# Patient Record
Sex: Female | Born: 1975 | Race: Black or African American | Hispanic: No | Marital: Single | Smoking: Never smoker
Health system: Southern US, Community
[De-identification: ages and names within clinical notes are randomized; demographics above are authoritative.]

## PROBLEM LIST (undated history)

## (undated) DIAGNOSIS — F419 Anxiety disorder, unspecified: Secondary | ICD-10-CM

## (undated) DIAGNOSIS — T8859XA Other complications of anesthesia, initial encounter: Secondary | ICD-10-CM

## (undated) DIAGNOSIS — D649 Anemia, unspecified: Secondary | ICD-10-CM

## (undated) DIAGNOSIS — T4145XA Adverse effect of unspecified anesthetic, initial encounter: Secondary | ICD-10-CM

## (undated) HISTORY — PX: TUBAL LIGATION: SHX77

## (undated) HISTORY — PX: BREAST BIOPSY: SHX20

---

## 1997-03-01 ENCOUNTER — Inpatient Hospital Stay (HOSPITAL_COMMUNITY): Admission: AD | Admit: 1997-03-01 | Discharge: 1997-03-01 | Payer: Self-pay | Admitting: Obstetrics

## 1997-03-06 ENCOUNTER — Ambulatory Visit (HOSPITAL_COMMUNITY): Admission: RE | Admit: 1997-03-06 | Discharge: 1997-03-06 | Payer: Self-pay | Admitting: Obstetrics

## 1997-05-13 ENCOUNTER — Other Ambulatory Visit: Admission: RE | Admit: 1997-05-13 | Discharge: 1997-05-13 | Payer: Self-pay | Admitting: Obstetrics

## 1997-05-31 ENCOUNTER — Inpatient Hospital Stay (HOSPITAL_COMMUNITY): Admission: AD | Admit: 1997-05-31 | Discharge: 1997-05-31 | Payer: Self-pay | Admitting: Obstetrics

## 1997-06-08 ENCOUNTER — Inpatient Hospital Stay (HOSPITAL_COMMUNITY): Admission: AD | Admit: 1997-06-08 | Discharge: 1997-06-08 | Payer: Self-pay | Admitting: Obstetrics

## 1997-06-10 ENCOUNTER — Other Ambulatory Visit: Admission: RE | Admit: 1997-06-10 | Discharge: 1997-06-10 | Payer: Self-pay | Admitting: Obstetrics

## 1997-06-16 ENCOUNTER — Inpatient Hospital Stay (HOSPITAL_COMMUNITY): Admission: AD | Admit: 1997-06-16 | Discharge: 1997-06-16 | Payer: Self-pay | Admitting: Obstetrics

## 1997-06-23 ENCOUNTER — Inpatient Hospital Stay (HOSPITAL_COMMUNITY): Admission: AD | Admit: 1997-06-23 | Discharge: 1997-06-23 | Payer: Self-pay | Admitting: Obstetrics

## 1997-06-26 ENCOUNTER — Encounter (HOSPITAL_COMMUNITY): Admission: RE | Admit: 1997-06-26 | Discharge: 1997-06-29 | Payer: Self-pay | Admitting: Obstetrics

## 1997-06-28 ENCOUNTER — Inpatient Hospital Stay (HOSPITAL_COMMUNITY): Admission: AD | Admit: 1997-06-28 | Discharge: 1997-06-30 | Payer: Self-pay | Admitting: Obstetrics

## 1997-07-09 ENCOUNTER — Inpatient Hospital Stay (HOSPITAL_COMMUNITY): Admission: AD | Admit: 1997-07-09 | Discharge: 1997-07-10 | Payer: Self-pay | Admitting: Obstetrics

## 1997-08-18 ENCOUNTER — Ambulatory Visit (HOSPITAL_COMMUNITY): Admission: RE | Admit: 1997-08-18 | Discharge: 1997-08-18 | Payer: Self-pay | Admitting: Obstetrics

## 1999-03-12 ENCOUNTER — Encounter: Payer: Self-pay | Admitting: *Deleted

## 1999-03-12 ENCOUNTER — Emergency Department (HOSPITAL_COMMUNITY): Admission: EM | Admit: 1999-03-12 | Discharge: 1999-03-12 | Payer: Self-pay | Admitting: *Deleted

## 1999-06-06 ENCOUNTER — Other Ambulatory Visit: Admission: RE | Admit: 1999-06-06 | Discharge: 1999-06-06 | Payer: Self-pay | Admitting: Obstetrics

## 1999-10-23 ENCOUNTER — Encounter: Payer: Self-pay | Admitting: Emergency Medicine

## 1999-10-23 ENCOUNTER — Emergency Department (HOSPITAL_COMMUNITY): Admission: EM | Admit: 1999-10-23 | Discharge: 1999-10-23 | Payer: Self-pay | Admitting: Emergency Medicine

## 1999-10-26 ENCOUNTER — Emergency Department (HOSPITAL_COMMUNITY): Admission: EM | Admit: 1999-10-26 | Discharge: 1999-10-26 | Payer: Self-pay | Admitting: Emergency Medicine

## 1999-10-28 ENCOUNTER — Emergency Department (HOSPITAL_COMMUNITY): Admission: EM | Admit: 1999-10-28 | Discharge: 1999-10-28 | Payer: Self-pay

## 1999-11-02 ENCOUNTER — Emergency Department (HOSPITAL_COMMUNITY): Admission: EM | Admit: 1999-11-02 | Discharge: 1999-11-02 | Payer: Self-pay | Admitting: Emergency Medicine

## 2000-04-25 ENCOUNTER — Encounter: Payer: Self-pay | Admitting: Emergency Medicine

## 2000-04-25 ENCOUNTER — Emergency Department (HOSPITAL_COMMUNITY): Admission: EM | Admit: 2000-04-25 | Discharge: 2000-04-25 | Payer: Self-pay | Admitting: Emergency Medicine

## 2000-04-30 ENCOUNTER — Encounter: Admission: RE | Admit: 2000-04-30 | Discharge: 2000-04-30 | Payer: Self-pay | Admitting: Internal Medicine

## 2001-06-18 ENCOUNTER — Encounter: Admission: RE | Admit: 2001-06-18 | Discharge: 2001-06-18 | Payer: Self-pay | Admitting: Internal Medicine

## 2001-12-20 ENCOUNTER — Emergency Department (HOSPITAL_COMMUNITY): Admission: EM | Admit: 2001-12-20 | Discharge: 2001-12-20 | Payer: Self-pay | Admitting: Emergency Medicine

## 2002-07-10 ENCOUNTER — Emergency Department (HOSPITAL_COMMUNITY): Admission: EM | Admit: 2002-07-10 | Discharge: 2002-07-10 | Payer: Self-pay | Admitting: Emergency Medicine

## 2002-07-10 ENCOUNTER — Encounter: Payer: Self-pay | Admitting: Emergency Medicine

## 2002-11-19 ENCOUNTER — Inpatient Hospital Stay (HOSPITAL_COMMUNITY): Admission: AD | Admit: 2002-11-19 | Discharge: 2002-11-19 | Payer: Self-pay | Admitting: *Deleted

## 2004-02-06 ENCOUNTER — Emergency Department (HOSPITAL_COMMUNITY): Admission: EM | Admit: 2004-02-06 | Discharge: 2004-02-06 | Payer: Self-pay | Admitting: Emergency Medicine

## 2004-05-28 ENCOUNTER — Emergency Department (HOSPITAL_COMMUNITY): Admission: EM | Admit: 2004-05-28 | Discharge: 2004-05-28 | Payer: Self-pay | Admitting: Emergency Medicine

## 2004-07-07 ENCOUNTER — Emergency Department (HOSPITAL_COMMUNITY): Admission: EM | Admit: 2004-07-07 | Discharge: 2004-07-07 | Payer: Self-pay | Admitting: Emergency Medicine

## 2006-05-21 ENCOUNTER — Inpatient Hospital Stay (HOSPITAL_COMMUNITY): Admission: AD | Admit: 2006-05-21 | Discharge: 2006-05-22 | Payer: Self-pay | Admitting: Obstetrics & Gynecology

## 2006-07-12 ENCOUNTER — Emergency Department (HOSPITAL_COMMUNITY): Admission: EM | Admit: 2006-07-12 | Discharge: 2006-07-12 | Payer: Self-pay | Admitting: Family Medicine

## 2006-11-20 ENCOUNTER — Emergency Department (HOSPITAL_COMMUNITY): Admission: EM | Admit: 2006-11-20 | Discharge: 2006-11-20 | Payer: Self-pay | Admitting: Emergency Medicine

## 2007-04-25 ENCOUNTER — Ambulatory Visit: Payer: Self-pay | Admitting: Gynecology

## 2007-06-13 ENCOUNTER — Ambulatory Visit (HOSPITAL_COMMUNITY): Admission: RE | Admit: 2007-06-13 | Discharge: 2007-06-13 | Payer: Self-pay | Admitting: Obstetrics and Gynecology

## 2007-06-26 ENCOUNTER — Ambulatory Visit: Payer: Self-pay | Admitting: Obstetrics and Gynecology

## 2008-02-18 ENCOUNTER — Ambulatory Visit: Payer: Self-pay | Admitting: Obstetrics & Gynecology

## 2010-02-04 ENCOUNTER — Ambulatory Visit: Admit: 2010-02-04 | Payer: Self-pay | Admitting: Obstetrics and Gynecology

## 2010-02-13 ENCOUNTER — Encounter: Payer: Self-pay | Admitting: *Deleted

## 2010-04-20 ENCOUNTER — Ambulatory Visit: Payer: Self-pay | Admitting: Obstetrics and Gynecology

## 2010-05-09 ENCOUNTER — Ambulatory Visit: Payer: Self-pay | Admitting: Obstetrics and Gynecology

## 2010-05-09 LAB — POCT URINALYSIS DIP (DEVICE)
Bilirubin Urine: NEGATIVE
Glucose, UA: NEGATIVE mg/dL
Nitrite: NEGATIVE
Specific Gravity, Urine: 1.015 (ref 1.005–1.030)
Urobilinogen, UA: 0.2 mg/dL (ref 0.0–1.0)

## 2010-06-07 NOTE — Group Therapy Note (Signed)
NAMEMERIT, GADSBY NO.:  [id number]   MEDICAL RECORD NO.:  [id number]          PATIENT TYPE:  WOC   LOCATION:  [facility]                   FACILITY:  [facility]   PHYSICIAN:  Andrew Au, MD        DATE OF BIRTH:  1975/10/14   DATE OF SERVICE:  06/26/2007                                  CLINIC NOTE   The patient is a 35 year old African American female gravida 4, para 2-0-  2-2 who returned for repeat endometrial biopsy at no charge,  because of  the previous one being lost.  In talking to the patient, she had had a  problem apparently with menorrhagia.  Her TSH was normal when she was  here before and got an ultrasound that showed a small submucous fibroid,  but she says that her periods have been less since she was last here and  in view of the normal endometrial thickness and the age of the patient,  I do not think that it is necessary to do an endometrial biopsy repeat  today, which she seemed very happy to hear.  At the present time, I do  not think there is any need for treatment.  I told her if it recurred,  the bleeding especially with pain that probably the best option would be  hormone therapy, to began with either birth control pills or Depo-  Provera and endometrial ablation as a secondary and possible  hysterectomy as a last resort.  She seems to accept this inclination and  agreed that right now, she does not feel anything needs to be done.  The  patient does however complain of some vaginal discharge and a lump  formation at the lower portion of her vulva on the right.   On examination, she has had copious discharge with which we did a wet  prep on, and she has a hair follicle abscess on the right lower portion  of the labia majora which was only treated with doxycycline and sitz  baths.  She says she gets some recurrent lesions in the genital area  usually when her discharge is very heavy.   IMPRESSION:  1. Small leiomyomata uteri, history of  menorrhagia, at the present      time under control.  2. History of anemia.  We are going to get a CBC because she says she      is on continued iron therapy, but we do not know what her      hemoglobin is.  3. Small hair follicle abscess.            ______________________________  Andrew Au, MD     PR/MEDQ  D:  06/26/2007  T:  06/27/2007  Job:  [id number]

## 2010-06-07 NOTE — Group Therapy Note (Signed)
NAMEAVON, MOLOCK NO.:  [id number]   MEDICAL RECORD NO.:  [id number]          PATIENT TYPE:  WOC   LOCATION:  [facility]                   FACILITY:  [facility]   PHYSICIAN:  Willey Blade, MD DATE OF BIRTH:  1975-09-08   DATE OF SERVICE:  04/25/2007                                  CLINIC NOTE   REASON FOR OFFICE VISIT:  Menorrhagia and dysmenorrhea.   HISTORY OF PRESENT ILLNESS:  This patient is a 35 year old African-  American female gravida 4, para 2-0-2-2, who presents with a  longstanding multiyear history of menorrhagia and dysmenorrhea.  The  patient states over the past 6 months her menses have lasted  approximately 7 days out of a 30-day cycle with significant menstrual  discomfort.  She denies intermenstrual bleeding.  She takes no  medications to enhance her bleeding propensity and has no personal or  family history of bleeding diatheses.  She was referred by the [facility].  She had a hemoglobin of 8.1 on February 28, 2007, and  started on ferrous sulfate 325 mg daily.  The patient states her mother  has had a fibroid uterus with a hysterectomy performed in the past.   OB/GYN HISTORY:  The patient has had 2 normal vaginal deliveries and 2  first trimester voluntary terminations of pregnancy.  She has had a  bilateral tubal ligation in 1999.  The patient states she had a Pap  smear performed in February 2009 at the [facility].  The remainder of her medical and surgical history is  unremarkable and noted in the medical chart.   SALIENT PHYSICAL FINDINGS:  Blood pressure is 115/69, weight 142 pounds,  height 66 inches.  ABDOMEN:  Soft without gross hepatosplenomegaly.  PELVIC EXAM:  External genitalia, vulva, and vagina normal.  Cervix  smooth without erosions or lesions.  Uterus palpates to 8-10-week size,  anteverted, and retroflexed.  Both adnexa palpable found to be normal.  An endometrial biopsy was performed,  and the uterus sounded to 10 cm,  moderate tissue obtained.   IMPRESSION AND PLAN:  Menorrhagia with dysmenorrhea and anemia of  chronic blood loss.   PLAN:  I have ordered a pelvic sonogram and a TSH and will await results  of endometrial biopsy.  We discussed options including oral  contraception, a Mirena intrauterine device, endometrial ablation, and  total vaginal hysterectomy as reasonable options for management of her  bleeding.  The patient states that her primary complaint has been  fatigue secondary to her bleeding and anemia.  A TSH was also ordered on  today's visit.  She will be rescheduled for 2 weeks to discuss her  findings of her TSH, endometrial biopsy, and ultrasound report and to  proceed from there.           ______________________________  Willey Blade, MD     SHB/MEDQ  D:  04/25/2007  T:  04/25/2007  Job:  [id number]

## 2010-10-18 LAB — POCT PREGNANCY, URINE
Operator id: 148111
Preg Test, Ur: NEGATIVE

## 2010-10-20 LAB — POCT PREGNANCY, URINE: Preg Test, Ur: NEGATIVE

## 2010-11-02 LAB — POCT PREGNANCY, URINE
Operator id: 116391
Preg Test, Ur: NEGATIVE

## 2010-11-02 LAB — POCT URINALYSIS DIP (DEVICE)
Hgb urine dipstick: NEGATIVE
Nitrite: NEGATIVE
Protein, ur: NEGATIVE
Specific Gravity, Urine: 1.02
Urobilinogen, UA: 0.2

## 2010-11-02 LAB — GC/CHLAMYDIA PROBE AMP, GENITAL
Chlamydia, DNA Probe: NEGATIVE
GC Probe Amp, Genital: NEGATIVE

## 2011-01-14 ENCOUNTER — Encounter: Payer: Self-pay | Admitting: *Deleted

## 2011-01-14 ENCOUNTER — Emergency Department (INDEPENDENT_AMBULATORY_CARE_PROVIDER_SITE_OTHER)
Admission: EM | Admit: 2011-01-14 | Discharge: 2011-01-14 | Disposition: A | Discharge status: Home / Self Care | Payer: PRIVATE HEALTH INSURANCE | Source: Home / Self Care

## 2011-01-14 DIAGNOSIS — K047 Periapical abscess without sinus: Secondary | ICD-10-CM

## 2011-01-14 MED ORDER — HYDROCODONE-ACETAMINOPHEN 5-325 MG PO TABS: 1.0000 | ORAL_TABLET | Freq: Four times a day (QID) | ORAL | Status: AC | PRN

## 2011-01-14 MED ORDER — PENICILLIN V POTASSIUM 500 MG PO TABS: 500.0000 mg | ORAL_TABLET | Freq: Three times a day (TID) | ORAL | Status: AC

## 2011-01-14 NOTE — ED Provider Notes (Signed)
History     CSN: [id number]  Arrival date & time 01/14/11  1517   None     Chief Complaint  Patient presents with  . Dental Pain    (Consider location/radiation/quality/duration/timing/severity/associated sxs/prior treatment) HPI Comments: Pt c/o Rt lower tooth pain x 3 days - worsening. She states the filling came out of the tooth approx 1 yr ago and she has noticed pieces of the tooth breaking off since. She has tried an otc pain reliever and an unknown pain medication of her daughters without relief. She has a Education officer, community and plans to call for an appt next week.   The history is provided by the patient.    History reviewed. No pertinent past medical history.  Past Surgical History  Procedure Date  . Tubal ligation     History reviewed. No pertinent family history.  History  Substance Use Topics  . Smoking status: Never Smoker   . Smokeless tobacco: Never Used  . Alcohol Use: No    OB History    Grav Para Term Preterm Abortions TAB SAB Ect Mult Living                  Review of Systems  Constitutional: Negative for fever and chills.  HENT: Negative for ear pain, sore throat, mouth sores and neck pain.   Respiratory: Negative for cough and shortness of breath.     Allergies  Review of patient's allergies indicates no known allergies.  Home Medications   Current Outpatient Rx  Name Route Sig Dispense Refill  . HYDROCODONE-ACETAMINOPHEN 5-325 MG PO TABS Oral Take 1 tablet by mouth every 6 (six) hours as needed for pain. 10 tablet 0  . PENICILLIN V POTASSIUM 500 MG PO TABS Oral Take 1 tablet (500 mg total) by mouth 3 (three) times daily. 30 tablet 0    BP 129/79  Pulse 92  Temp(Src) 98.8 F (37.1 C) (Oral)  Resp 12  SpO2 100%  LMP 12/28/2010  Physical Exam  Nursing note and vitals reviewed. Constitutional: She appears well-developed and well-nourished. No distress.  HENT:  Head: Normocephalic and atraumatic.  Right Ear: Tympanic membrane, external  ear and ear canal normal.  Left Ear: Tympanic membrane, external ear and ear canal normal.  Nose: Nose normal.  Mouth/Throat: Uvula is midline, oropharynx is clear and moist and mucous membranes are normal. Abnormal dentition (Broken Rt lower molar). Dental abscesses (Rt lower gum swelling and erythema lateral to molar) present. No oropharyngeal exudate, posterior oropharyngeal edema or posterior oropharyngeal erythema.    Neck: Neck supple.  Cardiovascular: Normal rate, regular rhythm and normal heart sounds.   Pulmonary/Chest: Effort normal and breath sounds normal. No respiratory distress.  Lymphadenopathy:    She has no cervical adenopathy.  Neurological: She is alert.  Skin: Skin is warm and dry.  Psychiatric: She has a normal mood and affect.    ED Course  Procedures (including critical care time)  Labs Reviewed - No data to display No results found.   1. Dental abscess       MDM          Melody Comas, PA 01/14/11 1744

## 2011-01-14 NOTE — ED Notes (Signed)
Pt c/o a toothache x 3 days. Tooth on the lower RIGHT side noted to be broken off below the gumline. Reports using OTC pain meds w/o relief.

## 2011-01-15 NOTE — ED Provider Notes (Signed)
Medical screening examination/treatment/procedure(s) were performed by non-physician practitioner and as supervising physician I was immediately available for consultation/collaboration.   Barkley Bruns MD.    Barkley Bruns, MD 01/15/11 1409

## 2013-06-05 ENCOUNTER — Encounter (HOSPITAL_COMMUNITY): Payer: Self-pay | Admitting: General Practice

## 2013-06-05 ENCOUNTER — Ambulatory Visit: Payer: Self-pay | Admitting: Obstetrics & Gynecology

## 2013-06-05 ENCOUNTER — Inpatient Hospital Stay (HOSPITAL_COMMUNITY)
Admission: AD | Admit: 2013-06-05 | Discharge: 2013-06-05 | Disposition: A | Discharge status: Home / Self Care | Payer: PRIVATE HEALTH INSURANCE | Source: Ambulatory Visit | Attending: Obstetrics and Gynecology | Admitting: Obstetrics and Gynecology

## 2013-06-05 DIAGNOSIS — B9689 Other specified bacterial agents as the cause of diseases classified elsewhere: Secondary | ICD-10-CM | POA: Insufficient documentation

## 2013-06-05 DIAGNOSIS — N760 Acute vaginitis: Secondary | ICD-10-CM | POA: Insufficient documentation

## 2013-06-05 DIAGNOSIS — L293 Anogenital pruritus, unspecified: Secondary | ICD-10-CM | POA: Insufficient documentation

## 2013-06-05 DIAGNOSIS — A499 Bacterial infection, unspecified: Secondary | ICD-10-CM

## 2013-06-05 LAB — WET PREP, GENITAL
TRICH WET PREP: NONE SEEN
Yeast Wet Prep HPF POC: NONE SEEN

## 2013-06-05 LAB — POCT PREGNANCY, URINE: PREG TEST UR: NEGATIVE

## 2013-06-05 MED ORDER — METRONIDAZOLE 0.75 % VA GEL: 1.0000 | Freq: Two times a day (BID) | VAGINAL | Status: DC

## 2013-06-05 MED ORDER — FLUCONAZOLE 150 MG PO TABS: 150.0000 mg | ORAL_TABLET | Freq: Once | ORAL | Status: DC

## 2013-06-05 NOTE — MAU Provider Note (Signed)
History     CSN: [id number]  Arrival date and time: 06/05/13 1056   None     Chief Complaint  Patient presents with  . Vaginal Discharge  . Vaginal Itching   HPI This is a 38 y.o. female who presents with c/o vaginal discharge with itching. No odor. No abnormal bleeding.  RN Note: Creamy vag discharge with itching, no odor. Started about 3 days.       OB History   Grav Para Term Preterm Abortions TAB SAB Ect Mult Living   2 2 2       2       History reviewed. No pertinent past medical history.  Past Surgical History  Procedure Laterality Date  . Tubal ligation    . Breast biopsy Left     History reviewed. No pertinent family history.  History  Substance Use Topics  . Smoking status: Never Smoker   . Smokeless tobacco: Never Used  . Alcohol Use: No    Allergies: No Known Allergies  No prescriptions prior to admission    Review of Systems  Constitutional: Negative for fever and chills.  Gastrointestinal: Negative for nausea, vomiting and abdominal pain.  Genitourinary:       White vaginal discharge with itching    Physical Exam   Blood pressure 111/59, pulse 76, temperature 98.8 F (37.1 C), temperature source Oral, resp. rate 18, height 5\' 6"  (1.676 m), weight 66.225 kg (146 lb), last menstrual period 05/12/2013.  Physical Exam  Constitutional: She is oriented to person, place, and time. She appears well-developed and well-nourished. No distress.  Cardiovascular: Normal rate.   Respiratory: Effort normal.  GI: Soft. There is no tenderness. There is no rebound and no guarding.  Genitourinary: Uterus normal. Vaginal discharge (small to moderate thin white discharge, no erethema) found.  Musculoskeletal: Normal range of motion.  Neurological: She is alert and oriented to person, place, and time.  Skin: Skin is warm and dry.  Psychiatric: She has a normal mood and affect.    MAU Course  Procedures  MDM Results for orders placed during the  hospital encounter of 06/05/13 (from the past 72 hour(s))  WET PREP, GENITAL     Status: Abnormal   Collection Time    06/05/13 12:00 PM      Result Value Ref Range   Yeast Wet Prep HPF POC NONE SEEN  NONE SEEN   Trich, Wet Prep NONE SEEN  NONE SEEN   Clue Cells Wet Prep HPF POC FEW (*) NONE SEEN   WBC, Wet Prep HPF POC FEW (*) NONE SEEN   Comment: FEW BACTERIA SEEN  GC/CHLAMYDIA PROBE AMP     Status: None   Collection Time    06/05/13 12:00 PM      Result Value Ref Range   CT Probe RNA NEGATIVE  NEGATIVE   GC Probe RNA NEGATIVE  NEGATIVE   Comment: (NOTE)                                                                                               **Normal Reference Range: Negative**  Assay performed using the Gen-Probe APTIMA COMBO2 (R) Assay.     Acceptable specimen types for this assay include APTIMA Swabs (Unisex,     endocervical, urethral, or vaginal), first void urine, and ThinPrep     liquid based cytology samples.     Performed at [place], URINE     Status: None   Collection Time    06/05/13 12:34 PM      Result Value Ref Range   Preg Test, Ur NEGATIVE  NEGATIVE   Comment:            THE SENSITIVITY OF THIS     METHODOLOGY IS >24 mIU/mL     Assessment and Plan  A:  Mild BV      Itching, possible subclinical yeast infection  P:  Discussed findings       Will tx BV and give Diflucan for symptoms,since Flagyl may precipitate yeast anyway    Medication List         diphenhydrAMINE 25 MG tablet  Commonly known as:  BENADRYL  Take 25 mg by mouth every 6 (six) hours as needed.     fluconazole 150 MG tablet  Commonly known as:  DIFLUCAN  Take 1 tablet (150 mg total) by mouth once.     metroNIDAZOLE 0.75 % vaginal gel  Commonly known as:  METROGEL VAGINAL  Place 1 Applicatorful vaginally 2 (two) times daily.     multivitamin with minerals Tabs tablet  Take 1 tablet by mouth daily.         Seabron Spates 06/05/2013, 11:49 AM

## 2013-06-05 NOTE — MAU Note (Signed)
Creamy vag discharge with itching, no odor. Started about 3 days.

## 2013-06-05 NOTE — Discharge Instructions (Signed)
Bacterial Vaginosis Bacterial vaginosis is an infection of the vagina. It happens when too many of certain germs (bacteria) grow in the vagina. HOME CARE  Take your medicine as told by your doctor.  Finish your medicine even if you start to feel better.  Do not have sex until you finish your medicine and are better.  Tell your sex partner that you have an infection. They should see their doctor for treatment.  Practice safe sex. Use condoms. Have only one sex partner. GET HELP IF:  You are not getting better after 3 days of treatment.  You have more grey fluid (discharge) coming from your vagina than before.  You have more pain than before.  You have a fever. MAKE SURE YOU:   Understand these instructions.  Will watch your condition.  Will get help right away if you are not doing well or get worse. Document Released: 10/19/2007 Document Revised: 10/30/2012 Document Reviewed: 08/21/2012 [employer] Patient Information 2014 [employer].  

## 2013-06-06 LAB — GC/CHLAMYDIA PROBE AMP
CT PROBE, AMP APTIMA: NEGATIVE
GC PROBE AMP APTIMA: NEGATIVE

## 2013-06-08 NOTE — MAU Provider Note (Signed)
Attestation of Attending Supervision of Advanced Practitioner (CNM/NP): Evaluation and management procedures were performed by the Advanced Practitioner under my supervision and collaboration.  I have reviewed the Advanced Practitioner's note and chart, and I agree with the management and plan.  Remedios Mckone 06/08/2013 8:29 AM

## 2013-10-10 ENCOUNTER — Encounter (HOSPITAL_COMMUNITY): Payer: Self-pay | Admitting: Emergency Medicine

## 2013-10-10 ENCOUNTER — Emergency Department (HOSPITAL_COMMUNITY)
Admission: EM | Admit: 2013-10-10 | Discharge: 2013-10-10 | Disposition: A | Discharge status: Home / Self Care | Payer: PRIVATE HEALTH INSURANCE | Attending: Emergency Medicine | Admitting: Emergency Medicine

## 2013-10-10 ENCOUNTER — Emergency Department (HOSPITAL_COMMUNITY): Payer: PRIVATE HEALTH INSURANCE

## 2013-10-10 DIAGNOSIS — M25561 Pain in right knee: Secondary | ICD-10-CM

## 2013-10-10 DIAGNOSIS — Z79899 Other long term (current) drug therapy: Secondary | ICD-10-CM | POA: Insufficient documentation

## 2013-10-10 DIAGNOSIS — M25569 Pain in unspecified knee: Secondary | ICD-10-CM | POA: Insufficient documentation

## 2013-10-10 MED ORDER — IBUPROFEN 800 MG PO TABS: 800.0000 mg | ORAL_TABLET | Freq: Three times a day (TID) | ORAL | Status: DC

## 2013-10-10 MED ORDER — IBUPROFEN 800 MG PO TABS
800.0000 mg | ORAL_TABLET | Freq: Once | ORAL | Status: AC
  Administered 2013-10-10: 800 mg via ORAL
  Filled 2013-10-10: qty 1

## 2013-10-10 NOTE — ED Notes (Signed)
Patient transported to X-ray 

## 2013-10-10 NOTE — ED Provider Notes (Signed)
CSN: [id number]     Arrival date & time 10/10/13  0703 History   First MD Initiated Contact with Patient 10/10/13 [phone]     Chief Complaint  Patient presents with  . Knee Pain     (Consider location/radiation/quality/duration/timing/severity/associated sxs/prior Treatment) HPI Ms. Matus is a 38 year old female with no past medical history is presents to the ER today with right knee pain. Patient states she woke up yesterday morning and noted pain in her knee. She states she's noticed gradual swelling in her knee since then. Patient denies any injury to her knee, states she does not play any sports or recall any twisting, falling or blunt trauma to her knee to indicate any type of mechanism of injury. She denies any erythema, warmth, numbness, tingling, weakness distally. She states her pain is in the anterior portion of her knee. History reviewed. No pertinent past medical history. Past Surgical History  Procedure Laterality Date  . Tubal ligation    . Breast biopsy Left    Family History  Problem Relation Age of Onset  . Hyperlipidemia Father   . Hypertension Other    History  Substance Use Topics  . Smoking status: Never Smoker   . Smokeless tobacco: Never Used  . Alcohol Use: No   OB History   Grav Para Term Preterm Abortions TAB SAB Ect Mult Living   2 2 2       2      Review of Systems  Musculoskeletal: Positive for arthralgias and joint swelling.  Skin: Negative for rash.  Neurological: Negative for weakness and numbness.  Psychiatric/Behavioral: Negative.       Allergies  Review of patient's allergies indicates no known allergies.  Home Medications   Prior to Admission medications   Medication Sig Start Date End Date Taking? Authorizing Provider  Multiple Vitamin (MULTIVITAMIN WITH MINERALS) TABS tablet Take 1 tablet by mouth daily.   Yes Historical Provider, MD   BP 125/72  Pulse 90  Temp(Src) 98.4 F (36.9 C) (Oral)  Resp 16  SpO2 100%  LMP  09/14/2013 Physical Exam  Nursing note and vitals reviewed. Constitutional: She appears well-developed and well-nourished. No distress.  HENT:  Head: Normocephalic and atraumatic.  Eyes: Conjunctivae are normal. Right eye exhibits no discharge. Left eye exhibits no discharge. No scleral icterus.  Cardiovascular:  Peripheral pulses intact at injured extremity.   Pulmonary/Chest: Effort normal. No respiratory distress.  Musculoskeletal:  Mild effusion noted to anterior/superior portion of the knee, as well as posterior portion of the knee. Patient has approximately 150 of active extension, 45 of active flexion. No anterior/posterior instability, no medial/lateral instability. DP pulse 2+. Distal sensation intact.  Neurological: She is alert.  No numbness distal to injury.    Skin: Skin is warm and dry. No rash noted. She is not diaphoretic.    ED Course  Procedures (including critical care time) Labs Review Labs Reviewed - No data to display  Imaging Review Dg Knee Complete 4 Views Right  10/10/2013   CLINICAL DATA:  Right knee pain  EXAM: RIGHT KNEE - COMPLETE 4+ VIEW  COMPARISON:  None.  FINDINGS: There is no evidence of fracture and dislocation. Small joint effusion. There is no evidence of arthropathy or other focal bone abnormality. Soft tissues are unremarkable.  IMPRESSION: No acute osseous injury of the right knee.  Small joint effusion.   Electronically Signed   By: Kathreen Devoid   On: 10/10/2013 08:11     EKG Interpretation None  MDM   Final diagnoses:  Right knee pain    Patient with gradual onset of right knee effusion since yesterday. Denying any injury. Workup for possible osteoarthritis, acute knee injury. Effusion noted on exam. No obvious signs of instability or injury.  Anterior/posterior drawer test negative, McMurray's sign negative.  Radiograph of right knee unremarkable for any acute osseous injury. Small joint effusion noted on x-ray. I discussed RICE  protocol and use of Motrin with patient and encourage her to followup within the next week with orthopedics. I encouraged patient to call or return to ER should her symptoms persist, worsen or should she have a questions or concerns  BP 125/72  Pulse 90  Temp(Src) 98.4 F (36.9 C) (Oral)  Resp 16  SpO2 100%  LMP 09/14/2013   Signed,  Dahlia Bailiff, PA-C 6:15 PM   Patient discussed with Dr. Lajean Saver, MD  Carrie Mew, PA-C 10/10/13 1816

## 2013-10-10 NOTE — Discharge Instructions (Signed)
Followup with orthopedics for your knee pain. Use ice when not in use. Rest the knee whenever possible and elevate it. Return to the ER if you develop a fever, if you notice any redness or warmth, or should her symptoms worsen, or persist. Use Motrin for swelling and pain.   Knee Pain The knee is the complex joint between your thigh and your lower leg. It is made up of bones, tendons, ligaments, and cartilage. The bones that make up the knee are:  The femur in the thigh.  The tibia and fibula in the lower leg.  The patella or kneecap riding in the groove on the lower femur. CAUSES  Knee pain is a common complaint with many causes. A few of these causes are:  Injury, such as:  A ruptured ligament or tendon injury.  Torn cartilage.  Medical conditions, such as:  Gout  Arthritis  Infections  Overuse, over training, or overdoing a physical activity. Knee pain can be minor or severe. Knee pain can accompany debilitating injury. Minor knee problems often respond well to self-care measures or get well on their own. More serious injuries may need medical intervention or even surgery. SYMPTOMS The knee is complex. Symptoms of knee problems can vary widely. Some of the problems are:  Pain with movement and weight bearing.  Swelling and tenderness.  Buckling of the knee.  Inability to straighten or extend your knee.  Your knee locks and you cannot straighten it.  Warmth and redness with pain and fever.  Deformity or dislocation of the kneecap. DIAGNOSIS  Determining what is wrong may be very straight forward such as when there is an injury. It can also be challenging because of the complexity of the knee. Tests to make a diagnosis may include:  Your caregiver taking a history and doing a physical exam.  Routine X-rays can be used to rule out other problems. X-rays will not reveal a cartilage tear. Some injuries of the knee can be diagnosed by:  Arthroscopy a surgical  technique by which a small video camera is inserted through tiny incisions on the sides of the knee. This procedure is used to examine and repair internal knee joint problems. Tiny instruments can be used during arthroscopy to repair the torn knee cartilage (meniscus).  Arthrography is a radiology technique. A contrast liquid is directly injected into the knee joint. Internal structures of the knee joint then become visible on X-ray film.  An MRI scan is a non X-ray radiology procedure in which magnetic fields and a computer produce two- or three-dimensional images of the inside of the knee. Cartilage tears are often visible using an MRI scanner. MRI scans have largely replaced arthrography in diagnosing cartilage tears of the knee.  Blood work.  Examination of the fluid that helps to lubricate the knee joint (synovial fluid). This is done by taking a sample out using a needle and a syringe. TREATMENT The treatment of knee problems depends on the cause. Some of these treatments are:  Depending on the injury, proper casting, splinting, surgery, or physical therapy care will be needed.  Give yourself adequate recovery time. Do not overuse your joints. If you begin to get sore during workout routines, back off. Slow down or do fewer repetitions.  For repetitive activities such as cycling or running, maintain your strength and nutrition.  Alternate muscle groups. For example, if you are a weight lifter, work the upper body on one day and the lower body the next.  Either  tight or weak muscles do not give the proper support for your knee. Tight or weak muscles do not absorb the stress placed on the knee joint. Keep the muscles surrounding the knee strong.  Take care of mechanical problems.  If you have flat feet, orthotics or special shoes may help. See your caregiver if you need help.  Arch supports, sometimes with wedges on the inner or outer aspect of the heel, can help. These can shift  pressure away from the side of the knee most bothered by osteoarthritis.  A brace called an "unloader" brace also may be used to help ease the pressure on the most arthritic side of the knee.  If your caregiver has prescribed crutches, braces, wraps or ice, use as directed. The acronym for this is PRICE. This means protection, rest, ice, compression, and elevation.  Nonsteroidal anti-inflammatory drugs (NSAIDs), can help relieve pain. But if taken immediately after an injury, they may actually increase swelling. Take NSAIDs with food in your stomach. Stop them if you develop stomach problems. Do not take these if you have a history of ulcers, stomach pain, or bleeding from the bowel. Do not take without your caregiver's approval if you have problems with fluid retention, heart failure, or kidney problems.  For ongoing knee problems, physical therapy may be helpful.  Glucosamine and chondroitin are over-the-counter dietary supplements. Both may help relieve the pain of osteoarthritis in the knee. These medicines are different from the usual anti-inflammatory drugs. Glucosamine may decrease the rate of cartilage destruction.  Injections of a corticosteroid drug into your knee joint may help reduce the symptoms of an arthritis flare-up. They may provide pain relief that lasts a few months. You may have to wait a few months between injections. The injections do have a small increased risk of infection, water retention, and elevated blood sugar levels.  Hyaluronic acid injected into damaged joints may ease pain and provide lubrication. These injections may work by reducing inflammation. A series of shots may give relief for as long as 6 months.  Topical painkillers. Applying certain ointments to your skin may help relieve the pain and stiffness of osteoarthritis. Ask your pharmacist for suggestions. Many over the-counter products are approved for temporary relief of arthritis pain.  In some countries,  doctors often prescribe topical NSAIDs for relief of chronic conditions such as arthritis and tendinitis. A review of treatment with NSAID creams found that they worked as well as oral medications but without the serious side effects. PREVENTION  Maintain a healthy weight. Extra pounds put more strain on your joints.  Get strong, stay limber. Weak muscles are a common cause of knee injuries. Stretching is important. Include flexibility exercises in your workouts.  Be smart about exercise. If you have osteoarthritis, chronic knee pain or recurring injuries, you may need to change the way you exercise. This does not mean you have to stop being active. If your knees ache after jogging or playing basketball, consider switching to swimming, water aerobics, or other low-impact activities, at least for a few days a week. Sometimes limiting high-impact activities will provide relief.  Make sure your shoes fit well. Choose footwear that is right for your sport.  Protect your knees. Use the proper gear for knee-sensitive activities. Use kneepads when playing volleyball or laying carpet. Buckle your seat belt every time you drive. Most shattered kneecaps occur in car accidents.  Rest when you are tired. SEEK MEDICAL CARE IF:  You have knee pain that is continual  and does not seem to be getting better.  SEEK IMMEDIATE MEDICAL CARE IF:  Your knee joint feels hot to the touch and you have a high fever. MAKE SURE YOU:   Understand these instructions.  Will watch your condition.  Will get help right away if you are not doing well or get worse. Document Released: 11/06/2006 Document Revised: 04/03/2011 Document Reviewed: 11/06/2006 [facility] Patient Information 2015 [place], [place]. This information is not intended to replace advice given to you by your health care provider. Make sure you discuss any questions you have with your health care provider.

## 2013-10-10 NOTE — ED Notes (Signed)
Pt states she woke yesterday morning with pain in her right knee  Denies injury

## 2013-10-12 NOTE — ED Provider Notes (Signed)
Medical screening examination/treatment/procedure(s) were performed by non-physician practitioner and as supervising physician I was immediately available for consultation/collaboration.    Mirna Mires, MD 10/12/13 [phone]

## 2013-11-24 ENCOUNTER — Encounter (HOSPITAL_COMMUNITY): Payer: Self-pay | Admitting: Emergency Medicine

## 2014-01-19 ENCOUNTER — Encounter: Payer: Self-pay | Admitting: *Deleted

## 2014-01-20 ENCOUNTER — Encounter: Payer: Self-pay | Admitting: Obstetrics & Gynecology

## 2014-07-19 ENCOUNTER — Inpatient Hospital Stay (HOSPITAL_COMMUNITY)
Admission: AD | Admit: 2014-07-19 | Discharge: 2014-07-19 | Disposition: A | Discharge status: Home / Self Care | Payer: Self-pay | Source: Ambulatory Visit | Attending: Obstetrics & Gynecology | Admitting: Obstetrics & Gynecology

## 2014-07-19 ENCOUNTER — Encounter (HOSPITAL_COMMUNITY): Payer: Self-pay

## 2014-07-19 DIAGNOSIS — N760 Acute vaginitis: Secondary | ICD-10-CM | POA: Insufficient documentation

## 2014-07-19 DIAGNOSIS — A499 Bacterial infection, unspecified: Secondary | ICD-10-CM

## 2014-07-19 DIAGNOSIS — B9689 Other specified bacterial agents as the cause of diseases classified elsewhere: Secondary | ICD-10-CM

## 2014-07-19 LAB — URINALYSIS, ROUTINE W REFLEX MICROSCOPIC
BILIRUBIN URINE: NEGATIVE
Glucose, UA: NEGATIVE mg/dL
HGB URINE DIPSTICK: NEGATIVE
KETONES UR: NEGATIVE mg/dL
Leukocytes, UA: NEGATIVE
Nitrite: NEGATIVE
PH: 6 (ref 5.0–8.0)
Protein, ur: NEGATIVE mg/dL
SPECIFIC GRAVITY, URINE: 1.025 (ref 1.005–1.030)
Urobilinogen, UA: 0.2 mg/dL (ref 0.0–1.0)

## 2014-07-19 LAB — POCT PREGNANCY, URINE: Preg Test, Ur: NEGATIVE

## 2014-07-19 LAB — WET PREP, GENITAL
Trich, Wet Prep: NONE SEEN
YEAST WET PREP: NONE SEEN

## 2014-07-19 MED ORDER — METRONIDAZOLE 500 MG PO TABS: 500.0000 mg | ORAL_TABLET | Freq: Two times a day (BID) | ORAL | Status: DC

## 2014-07-19 NOTE — MAU Note (Signed)
Pt presents to MAU with complaints of a vaginal discharge with a foul odor for approximately two weeks.

## 2014-07-19 NOTE — MAU Provider Note (Signed)
History     CSN: [id number]  Arrival date and time: 07/19/14 1131   First Provider Initiated Contact with Patient 07/19/14 1200      Chief Complaint  Patient presents with  . Vaginal Discharge   HPI 39 y.o. [id number] w/ discharge and odor x 2 weeks, no pain or bleeding.   History reviewed. No pertinent past medical history.  Past Surgical History  Procedure Laterality Date  . Tubal ligation    . Breast biopsy Left     Family History  Problem Relation Age of Onset  . Hyperlipidemia Father   . Hypertension Other     History  Substance Use Topics  . Smoking status: Never Smoker   . Smokeless tobacco: Never Used  . Alcohol Use: No    Allergies: No Known Allergies  Prescriptions prior to admission  Medication Sig Dispense Refill Last Dose  . Multiple Vitamins-Minerals (ALIVE WOMENS GUMMY) CHEW Chew 1 tablet by mouth 2 (two) times daily.   07/18/2014 at Unknown time  . Multiple Vitamin (MULTIVITAMIN WITH MINERALS) TABS tablet Take 1 tablet by mouth daily.   Not Taking at Unknown time    Review of Systems  Constitutional: Negative.   Respiratory: Negative.   Cardiovascular: Negative.   Gastrointestinal: Negative for nausea, vomiting, abdominal pain, diarrhea and constipation.  Genitourinary: Negative for dysuria, urgency, frequency, hematuria and flank pain.       Negative for vaginal bleeding, + discharge   Musculoskeletal: Negative.   Neurological: Negative.   Psychiatric/Behavioral: Negative.    Physical Exam   Blood pressure 109/62, pulse 86, temperature 97.8 F (36.6 C), resp. rate 18, height 5\' 4"  (1.626 m), weight 165 lb (74.844 kg), last menstrual period 06/18/2014.  Physical Exam  Nursing note and vitals reviewed. Constitutional: She is oriented to person, place, and time. She appears well-developed and well-nourished. No distress.  HENT:  Head: Normocephalic and atraumatic.  Cardiovascular: Normal rate.   Respiratory: Effort normal. No respiratory  distress.  GI: Soft. She exhibits no distension and no mass. There is no tenderness. There is no rebound and no guarding.  Genitourinary: There is no rash or lesion on the right labia. There is no rash or lesion on the left labia. Cervix exhibits no discharge and no friability. No erythema, tenderness or bleeding in the vagina. Vaginal discharge (white, odor) found.  Neurological: She is alert and oriented to person, place, and time.  Skin: Skin is warm and dry.  Psychiatric: She has a normal mood and affect.    MAU Course  Procedures  Results for orders placed or performed during the hospital encounter of 07/19/14 (from the past 24 hour(s))  Urinalysis, Routine w reflex microscopic (not at [facility])     Status: None   Collection Time: 07/19/14 11:45 AM  Result Value Ref Range   Color, Urine YELLOW YELLOW   APPearance CLEAR CLEAR   Specific Gravity, Urine 1.025 1.005 - 1.030   pH 6.0 5.0 - 8.0   Glucose, UA NEGATIVE NEGATIVE mg/dL   Hgb urine dipstick NEGATIVE NEGATIVE   Bilirubin Urine NEGATIVE NEGATIVE   Ketones, ur NEGATIVE NEGATIVE mg/dL   Protein, ur NEGATIVE NEGATIVE mg/dL   Urobilinogen, UA 0.2 0.0 - 1.0 mg/dL   Nitrite NEGATIVE NEGATIVE   Leukocytes, UA NEGATIVE NEGATIVE  Pregnancy, urine POC     Status: None   Collection Time: 07/19/14 11:50 AM  Result Value Ref Range   Preg Test, Ur NEGATIVE NEGATIVE  Wet prep, genital  Status: Abnormal   Collection Time: 07/19/14 12:07 PM  Result Value Ref Range   Yeast Wet Prep HPF POC NONE SEEN NONE SEEN   Trich, Wet Prep NONE SEEN NONE SEEN   Clue Cells Wet Prep HPF POC FEW (A) NONE SEEN   WBC, Wet Prep HPF POC FEW (A) NONE SEEN     Assessment and Plan   1. BV (bacterial vaginosis)       Medication List    STOP taking these medications        multivitamin with minerals Tabs tablet      TAKE these medications        ALIVE WOMENS GUMMY Chew  Chew 1 tablet by mouth 2 (two) times daily.     metroNIDAZOLE 500 MG  tablet  Commonly known as:  FLAGYL  Take 1 tablet (500 mg total) by mouth 2 (two) times daily.        Follow-up Information    Follow up with your regular gynecologist.   Why:  As needed        Stephon Weathers 07/19/2014, 12:39 PM

## 2014-07-19 NOTE — Discharge Instructions (Signed)
Bacterial Vaginosis Bacterial vaginosis is a vaginal infection that occurs when the normal balance of bacteria in the vagina is disrupted. It results from an overgrowth of certain bacteria. This is the most common vaginal infection in women of childbearing age. Treatment is important to prevent complications, especially in pregnant women, as it can cause a premature delivery. CAUSES  Bacterial vaginosis is caused by an increase in harmful bacteria that are normally present in smaller amounts in the vagina. Several different kinds of bacteria can cause bacterial vaginosis. However, the reason that the condition develops is not fully understood. RISK FACTORS Certain activities or behaviors can put you at an increased risk of developing bacterial vaginosis, including:  Having a new sex partner or multiple sex partners.  Douching.  Using an intrauterine device (IUD) for contraception. Women do not get bacterial vaginosis from toilet seats, bedding, swimming pools, or contact with objects around them. SIGNS AND SYMPTOMS  Some women with bacterial vaginosis have no signs or symptoms. Common symptoms include:  Grey vaginal discharge.  A fishlike odor with discharge, especially after sexual intercourse.  Itching or burning of the vagina and vulva.  Burning or pain with urination. DIAGNOSIS  Your health care provider will take a medical history and examine the vagina for signs of bacterial vaginosis. A sample of vaginal fluid may be taken. Your health care provider will look at this sample under a microscope to check for bacteria and abnormal cells. A vaginal pH test may also be done.  TREATMENT  Bacterial vaginosis may be treated with antibiotic medicines. These may be given in the form of a pill or a vaginal cream. A second round of antibiotics may be prescribed if the condition comes back after treatment.  HOME CARE INSTRUCTIONS   Only take over-the-counter or prescription medicines as  directed by your health care provider.  If antibiotic medicine was prescribed, take it as directed. Make sure you finish it even if you start to feel better.  Do not have sex until treatment is completed.  Tell all sexual partners that you have a vaginal infection. They should see their health care provider and be treated if they have problems, such as a mild rash or itching.  Practice safe sex by using condoms and only having one sex partner. SEEK MEDICAL CARE IF:   Your symptoms are not improving after 3 days of treatment.  You have increased discharge or pain.  You have a fever. MAKE SURE YOU:   Understand these instructions.  Will watch your condition.  Will get help right away if you are not doing well or get worse. FOR MORE INFORMATION  Centers for Disease Control and Prevention, Division of STD Prevention: www.cdc.gov/std American Sexual Health Association (ASHA): www.ashastd.org  Document Released: 01/09/2005 Document Revised: 10/30/2012 Document Reviewed: 08/21/2012 [employer] Patient Information 2015 [employer]. This information is not intended to replace advice given to you by your health care provider. Make sure you discuss any questions you have with your health care provider.  

## 2014-07-20 LAB — GC/CHLAMYDIA PROBE AMP (~~LOC~~) NOT AT ARMC
CHLAMYDIA, DNA PROBE: NEGATIVE
Neisseria Gonorrhea: NEGATIVE

## 2014-07-20 LAB — HIV ANTIBODY (ROUTINE TESTING W REFLEX): HIV SCREEN 4TH GENERATION: NONREACTIVE

## 2014-09-27 ENCOUNTER — Inpatient Hospital Stay (HOSPITAL_COMMUNITY)
Admission: AD | Admit: 2014-09-27 | Discharge: 2014-09-27 | Disposition: A | Discharge status: Home / Self Care | Payer: Self-pay | Source: Ambulatory Visit | Attending: Obstetrics and Gynecology | Admitting: Obstetrics and Gynecology

## 2014-09-27 ENCOUNTER — Encounter (HOSPITAL_COMMUNITY): Payer: Self-pay | Admitting: *Deleted

## 2014-09-27 DIAGNOSIS — B373 Candidiasis of vulva and vagina: Secondary | ICD-10-CM | POA: Insufficient documentation

## 2014-09-27 DIAGNOSIS — N760 Acute vaginitis: Secondary | ICD-10-CM | POA: Insufficient documentation

## 2014-09-27 DIAGNOSIS — B9689 Other specified bacterial agents as the cause of diseases classified elsewhere: Secondary | ICD-10-CM

## 2014-09-27 DIAGNOSIS — B3731 Acute candidiasis of vulva and vagina: Secondary | ICD-10-CM

## 2014-09-27 LAB — URINALYSIS, ROUTINE W REFLEX MICROSCOPIC
Bilirubin Urine: NEGATIVE
GLUCOSE, UA: NEGATIVE mg/dL
KETONES UR: NEGATIVE mg/dL
Leukocytes, UA: NEGATIVE
Nitrite: NEGATIVE
PROTEIN: NEGATIVE mg/dL
Specific Gravity, Urine: 1.025 (ref 1.005–1.030)
UROBILINOGEN UA: 0.2 mg/dL (ref 0.0–1.0)
pH: 5.5 (ref 5.0–8.0)

## 2014-09-27 LAB — WET PREP, GENITAL: Trich, Wet Prep: NONE SEEN

## 2014-09-27 LAB — URINE MICROSCOPIC-ADD ON

## 2014-09-27 LAB — POCT PREGNANCY, URINE: PREG TEST UR: NEGATIVE

## 2014-09-27 MED ORDER — TERCONAZOLE 0.4 % VA CREA: 1.0000 | TOPICAL_CREAM | Freq: Every day | VAGINAL | Status: DC

## 2014-09-27 MED ORDER — METRONIDAZOLE 500 MG PO TABS: 500.0000 mg | ORAL_TABLET | Freq: Two times a day (BID) | ORAL | Status: DC

## 2014-09-27 NOTE — MAU Provider Note (Signed)
History     CSN: [id number]  Arrival date and time: 09/27/14 1631   First Provider Initiated Contact with Patient 09/27/14 1739      Chief Complaint  Patient presents with  . Vaginitis   HPI Barbara Valdez 39 y.o. [id number] presents for vaginal discharge with odor for 2 weeks.  LMP was 8/25 and discharge was noted prior to that.  She believes the odor has gotten worse since her period started.  She denies abdominal pain, dysuria, weakness, fever.  The discharge is brown and yellowish.  She denies new sexual partner.  Last IC was 2 weeks ago.  She does not use condoms.  She is s/p BTL.  No hormonal use.   OB History    Gravida Para Term Preterm AB TAB SAB Ectopic Multiple Living   2 2 2       2       History reviewed. No pertinent past medical history.  Past Surgical History  Procedure Laterality Date  . Tubal ligation    . Breast biopsy Left     Family History  Problem Relation Age of Onset  . Hyperlipidemia Father   . Hypertension Other     Social History  Substance Use Topics  . Smoking status: Never Smoker   . Smokeless tobacco: Never Used  . Alcohol Use: No    Allergies: No Known Allergies  Prescriptions prior to admission  Medication Sig Dispense Refill Last Dose  . Multiple Vitamins-Minerals (ALIVE WOMENS GUMMY) CHEW Chew 1 tablet by mouth 2 (two) times daily.   09/27/2014 at Unknown time  . metroNIDAZOLE (FLAGYL) 500 MG tablet Take 1 tablet (500 mg total) by mouth 2 (two) times daily. (Patient not taking: Reported on 09/27/2014) 14 tablet 0 Completed Course at Unknown time    ROS Pertinent ROS in HPI.  All other systems are negative.   Physical Exam   Blood pressure 119/58, pulse 87, temperature 98.5 F (36.9 C), temperature source Oral, resp. rate 16, height 5' 6"  (1.676 m), weight 140 lb (63.504 kg), last menstrual period 09/17/2014.  Physical Exam  Constitutional: She is oriented to person, place, and time. She appears well-developed and well-nourished. No  distress.  HENT:  Head: Normocephalic and atraumatic.  Eyes: EOM are normal.  Cardiovascular: Normal rate.   Respiratory: No respiratory distress.  GI: Soft. There is no tenderness.  Genitourinary:  Large amt of malodorous blood/discharge No CMT.  No adnexal mass or tenderness  Musculoskeletal: Normal range of motion.  Neurological: She is alert and oriented to person, place, and time.  Skin: Skin is warm and dry.  Psychiatric: She has a normal mood and affect.   Results for orders placed or performed during the hospital encounter of 09/27/14 (from the past 24 hour(s))  Urinalysis, Routine w reflex microscopic (not at [facility])     Status: Abnormal   Collection Time: 09/27/14  5:18 PM  Result Value Ref Range   Color, Urine YELLOW YELLOW   APPearance CLEAR CLEAR   Specific Gravity, Urine 1.025 1.005 - 1.030   pH 5.5 5.0 - 8.0   Glucose, UA NEGATIVE NEGATIVE mg/dL   Hgb urine dipstick SMALL (A) NEGATIVE   Bilirubin Urine NEGATIVE NEGATIVE   Ketones, ur NEGATIVE NEGATIVE mg/dL   Protein, ur NEGATIVE NEGATIVE mg/dL   Urobilinogen, UA 0.2 0.0 - 1.0 mg/dL   Nitrite NEGATIVE NEGATIVE   Leukocytes, UA NEGATIVE NEGATIVE  Urine microscopic-add on     Status: Abnormal   Collection Time: 09/27/14  5:18 PM  Result Value Ref Range   Squamous Epithelial / LPF FEW (A) RARE   WBC, UA 0-2 <3 WBC/hpf   RBC / HPF 0-2 <3 RBC/hpf  Pregnancy, urine POC     Status: None   Collection Time: 09/27/14  5:35 PM  Result Value Ref Range   Preg Test, Ur NEGATIVE NEGATIVE  Wet prep, genital     Status: Abnormal   Collection Time: 09/27/14  5:50 PM  Result Value Ref Range   Yeast Wet Prep HPF POC FEW (A) NONE SEEN   Trich, Wet Prep NONE SEEN NONE SEEN   Clue Cells Wet Prep HPF POC MODERATE (A) NONE SEEN   WBC, Wet Prep HPF POC FEW (A) NONE SEEN    MAU Course  Procedures  MDM BV and yeast on wet prep.  Results reviewed  Assessment and Plan  A:  1. Vulvovaginal candidiasis   2. Bacterial  vaginosis    P: Discharge to home Flagyl x 1 week Terazol - wait to start until tomorrow and then use nightly x 1 week F/u with HD Patient may return to MAU as needed or if her condition were to change or worsen   [name] 09/27/2014, 5:41 PM

## 2014-09-27 NOTE — Discharge Instructions (Signed)
Bacterial Vaginosis Bacterial vaginosis is a vaginal infection that occurs when the normal balance of bacteria in the vagina is disrupted. It results from an overgrowth of certain bacteria. This is the most common vaginal infection in women of childbearing age. Treatment is important to prevent complications, especially in pregnant women, as it can cause a premature delivery. CAUSES  Bacterial vaginosis is caused by an increase in harmful bacteria that are normally present in smaller amounts in the vagina. Several different kinds of bacteria can cause bacterial vaginosis. However, the reason that the condition develops is not fully understood. RISK FACTORS Certain activities or behaviors can put you at an increased risk of developing bacterial vaginosis, including:  Having a new sex partner or multiple sex partners.  Douching.  Using an intrauterine device (IUD) for contraception. Women do not get bacterial vaginosis from toilet seats, bedding, swimming pools, or contact with objects around them. SIGNS AND SYMPTOMS  Some women with bacterial vaginosis have no signs or symptoms. Common symptoms include:  Grey vaginal discharge.  A fishlike odor with discharge, especially after sexual intercourse.  Itching or burning of the vagina and vulva.  Burning or pain with urination. DIAGNOSIS  Your health care provider will take a medical history and examine the vagina for signs of bacterial vaginosis. A sample of vaginal fluid may be taken. Your health care provider will look at this sample under a microscope to check for bacteria and abnormal cells. A vaginal pH test may also be done.  TREATMENT  Bacterial vaginosis may be treated with antibiotic medicines. These may be given in the form of a pill or a vaginal cream. A second round of antibiotics may be prescribed if the condition comes back after treatment.  HOME CARE INSTRUCTIONS   Only take over-the-counter or prescription medicines as  directed by your health care provider.  If antibiotic medicine was prescribed, take it as directed. Make sure you finish it even if you start to feel better.  Do not have sex until treatment is completed.  Tell all sexual partners that you have a vaginal infection. They should see their health care provider and be treated if they have problems, such as a mild rash or itching.  Practice safe sex by using condoms and only having one sex partner. SEEK MEDICAL CARE IF:   Your symptoms are not improving after 3 days of treatment.  You have increased discharge or pain.  You have a fever. MAKE SURE YOU:   Understand these instructions.  Will watch your condition.  Will get help right away if you are not doing well or get worse. FOR MORE INFORMATION  Centers for Disease Control and Prevention, Division of STD Prevention: www.cdc.gov/std American Sexual Health Association (ASHA): www.ashastd.org  Document Released: 01/09/2005 Document Revised: 10/30/2012 Document Reviewed: 08/21/2012 [employer] Patient Information 2015 [employer]. This information is not intended to replace advice given to you by your health care provider. Make sure you discuss any questions you have with your health care provider.  

## 2014-09-27 NOTE — MAU Note (Addendum)
Patient presents stating she is not pregnant with c/o vaginal odor X 2 weeks. States she is coming off her menstrual cycle so she is spotting but has a discharge and her pads irritate her as well.

## 2014-09-28 LAB — HIV ANTIBODY (ROUTINE TESTING W REFLEX): HIV Screen 4th Generation wRfx: NONREACTIVE

## 2014-09-29 LAB — GC/CHLAMYDIA PROBE AMP (~~LOC~~) NOT AT ARMC
Chlamydia: NEGATIVE
Neisseria Gonorrhea: NEGATIVE

## 2014-12-15 ENCOUNTER — Emergency Department (HOSPITAL_COMMUNITY)
Admission: EM | Admit: 2014-12-15 | Discharge: 2014-12-15 | Disposition: A | Discharge status: Home / Self Care | Payer: No Typology Code available for payment source | Attending: Emergency Medicine | Admitting: Emergency Medicine

## 2014-12-15 ENCOUNTER — Encounter (HOSPITAL_COMMUNITY): Payer: Self-pay

## 2014-12-15 DIAGNOSIS — S161XXA Strain of muscle, fascia and tendon at neck level, initial encounter: Secondary | ICD-10-CM | POA: Diagnosis not present

## 2014-12-15 DIAGNOSIS — S199XXA Unspecified injury of neck, initial encounter: Secondary | ICD-10-CM | POA: Diagnosis present

## 2014-12-15 DIAGNOSIS — Y92410 Unspecified street and highway as the place of occurrence of the external cause: Secondary | ICD-10-CM | POA: Insufficient documentation

## 2014-12-15 DIAGNOSIS — Z79899 Other long term (current) drug therapy: Secondary | ICD-10-CM | POA: Insufficient documentation

## 2014-12-15 DIAGNOSIS — S0990XA Unspecified injury of head, initial encounter: Secondary | ICD-10-CM | POA: Insufficient documentation

## 2014-12-15 DIAGNOSIS — S39012A Strain of muscle, fascia and tendon of lower back, initial encounter: Secondary | ICD-10-CM | POA: Diagnosis not present

## 2014-12-15 DIAGNOSIS — Z792 Long term (current) use of antibiotics: Secondary | ICD-10-CM | POA: Insufficient documentation

## 2014-12-15 DIAGNOSIS — Y998 Other external cause status: Secondary | ICD-10-CM | POA: Diagnosis not present

## 2014-12-15 DIAGNOSIS — Y9389 Activity, other specified: Secondary | ICD-10-CM | POA: Insufficient documentation

## 2014-12-15 MED ORDER — CYCLOBENZAPRINE HCL 10 MG PO TABS
5.0000 mg | ORAL_TABLET | Freq: Once | ORAL | Status: DC
Start: 2014-12-15 — End: 2014-12-16
  Filled 2014-12-15: qty 1

## 2014-12-15 MED ORDER — IBUPROFEN 200 MG PO TABS
600.0000 mg | ORAL_TABLET | Freq: Once | ORAL | Status: AC
  Administered 2014-12-15: 600 mg via ORAL
  Filled 2014-12-15: qty 3

## 2014-12-15 MED ORDER — NAPROXEN 375 MG PO TABS: 375.0000 mg | ORAL_TABLET | Freq: Two times a day (BID) | ORAL | Status: DC

## 2014-12-15 MED ORDER — CYCLOBENZAPRINE HCL 5 MG PO TABS: 5.0000 mg | ORAL_TABLET | Freq: Three times a day (TID) | ORAL | Status: DC | PRN
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
## 2014-12-19 ENCOUNTER — Emergency Department (HOSPITAL_COMMUNITY)
Admission: EM | Admit: 2014-12-19 | Discharge: 2014-12-19 | Disposition: A | Discharge status: Home / Self Care | Payer: No Typology Code available for payment source | Attending: Emergency Medicine | Admitting: Emergency Medicine

## 2014-12-19 ENCOUNTER — Encounter (HOSPITAL_COMMUNITY): Payer: Self-pay | Admitting: Emergency Medicine

## 2014-12-19 DIAGNOSIS — Z792 Long term (current) use of antibiotics: Secondary | ICD-10-CM | POA: Diagnosis not present

## 2014-12-19 DIAGNOSIS — M542 Cervicalgia: Secondary | ICD-10-CM | POA: Insufficient documentation

## 2014-12-19 DIAGNOSIS — R51 Headache: Secondary | ICD-10-CM | POA: Insufficient documentation

## 2014-12-19 DIAGNOSIS — Z791 Long term (current) use of non-steroidal anti-inflammatories (NSAID): Secondary | ICD-10-CM | POA: Diagnosis not present

## 2014-12-19 DIAGNOSIS — Z79899 Other long term (current) drug therapy: Secondary | ICD-10-CM | POA: Insufficient documentation

## 2014-12-19 DIAGNOSIS — M791 Myalgia, unspecified site: Secondary | ICD-10-CM

## 2014-12-19 NOTE — ED Notes (Signed)
AVS explained in detail. Pt is seeing a chiropractor on Monday. Given work note and heat packs. Ambulatory with steady gait. No other c/c. Knows to continue taking Ibuprofen and muscle relaxer at night.
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
## 2015-01-10 ENCOUNTER — Emergency Department (HOSPITAL_COMMUNITY)
Admission: EM | Admit: 2015-01-10 | Discharge: 2015-01-10 | Disposition: A | Discharge status: Home / Self Care | Payer: No Typology Code available for payment source | Attending: Emergency Medicine | Admitting: Emergency Medicine

## 2015-01-10 DIAGNOSIS — Z791 Long term (current) use of non-steroidal anti-inflammatories (NSAID): Secondary | ICD-10-CM | POA: Insufficient documentation

## 2015-01-10 DIAGNOSIS — Y92410 Unspecified street and highway as the place of occurrence of the external cause: Secondary | ICD-10-CM | POA: Diagnosis not present

## 2015-01-10 DIAGNOSIS — Y998 Other external cause status: Secondary | ICD-10-CM | POA: Insufficient documentation

## 2015-01-10 DIAGNOSIS — S79921A Unspecified injury of right thigh, initial encounter: Secondary | ICD-10-CM | POA: Diagnosis present

## 2015-01-10 DIAGNOSIS — Y9389 Activity, other specified: Secondary | ICD-10-CM | POA: Diagnosis not present

## 2015-01-10 DIAGNOSIS — S7011XA Contusion of right thigh, initial encounter: Secondary | ICD-10-CM | POA: Insufficient documentation

## 2015-01-10 DIAGNOSIS — S8992XA Unspecified injury of left lower leg, initial encounter: Secondary | ICD-10-CM | POA: Insufficient documentation

## 2015-01-10 DIAGNOSIS — Z79899 Other long term (current) drug therapy: Secondary | ICD-10-CM | POA: Insufficient documentation

## 2015-01-10 DIAGNOSIS — Z792 Long term (current) use of antibiotics: Secondary | ICD-10-CM | POA: Insufficient documentation

## 2015-01-10 NOTE — ED Notes (Signed)
PT DISCHARGED. INSTRUCTIONS GIVEN. AAOX3. PT IN NO APPARENT DISTRESS OR PAIN. THE OPPORTUNITY TO ASK QUESTIONS WAS PROVIDED. 
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
## 2015-01-10 NOTE — Discharge Instructions (Signed)
Read the information below.  You may return to the Emergency Department at any time for worsening condition or any new symptoms that concern you.  If you develop uncontrolled pain, weakness or numbness of the extremity, severe discoloration of the skin, or you are unable to walk or move your legs, return to the ER for a recheck.      Quadriceps Contusion A quadriceps contusion is a deep bruise of the large muscle in the front of your thigh. Contusions are the result of an injury that caused bleeding under the skin. The contusion may turn blue, purple, or yellow. Minor injuries will give you a painless contusion, but more severe contusions may stay painful and swollen for a few weeks. It is necessary to follow your caregiver's directions when this muscle is bruised.  CAUSES A quadriceps contusion comes from a blow or injury to the front of the leg. SYMPTOMS   Swelling and redness of the thigh area.  Bruising of the thigh area.  Tenderness or soreness of the thigh.  Limping.  Leg stiffness.  Difficulty bending the leg.  Trouble walking. DIAGNOSIS  You will have a physical exam and will be asked about your history. You may need an X-ray of your leg. TREATMENT  Often, the best treatment for a quadriceps contusion is resting and elevating the leg and applying cold compresses to the thigh area. Over-the-counter medicines may also be recommended for pain control. You may need crutches, an elastic wrap, or a leg splint.  HOME CARE INSTRUCTIONS   Put ice on the injured area.  Put ice in a plastic bag.  Place a towel between your skin and the bag.  Leave the ice on for 15-20 minutes, 03-04 times a day.  Only take over-the-counter or prescription medicines for pain, discomfort, or fever as directed by your caregiver.  Rest the injured thigh until the pain and swelling are better.  Elevate your leg to reduce swelling. Lie down flat on your back and place a pillow under your knee.  Apply  compression wraps as directed by your caregiver. You may remove it for sleeping, showers, and baths. If your toes become numb, cold, or blue, take the wrap off and reapply it more loosely.  Walk or move around as the pain allows, or as directed by your caregiver. Resume full activities only when your caregiver says it is okay. Returning to your usual activities before your caregiver approves may cause worse damage to the muscle.  See your caregiver as directed. It is very important to keep all follow-up referrals and appointments in order to avoid any long-term problems with your leg, including chronic pain or inability to move your leg normally. SEEK MEDICAL CARE IF:   You have increased bruising or swelling.  You have pain that is getting worse.  Your swelling or pain is not relieved by medicines.  Your toes or foot become cold or turn bluish in color.  You notice your thigh getting larger in size. MAKE SURE YOU:   Understand these instructions.  Will watch your condition.  Will get help right away if you are not doing well or get worse.   This information is not intended to replace advice given to you by your health care provider. Make sure you discuss any questions you have with your health care provider.   Document Released: 10/04/2000 Document Revised: 01/30/2014 Document Reviewed: 05/27/2014 Elsevier Interactive Patient Education [employer].

## 2015-09-14 ENCOUNTER — Inpatient Hospital Stay (HOSPITAL_COMMUNITY)
Admission: AD | Admit: 2015-09-14 | Discharge: 2015-09-14 | Disposition: A | Discharge status: Home / Self Care | Payer: Self-pay | Source: Ambulatory Visit | Attending: Family Medicine | Admitting: Family Medicine

## 2015-09-14 ENCOUNTER — Encounter (HOSPITAL_COMMUNITY): Payer: Self-pay | Admitting: *Deleted

## 2015-09-14 DIAGNOSIS — N898 Other specified noninflammatory disorders of vagina: Secondary | ICD-10-CM

## 2015-09-14 DIAGNOSIS — N760 Acute vaginitis: Secondary | ICD-10-CM

## 2015-09-14 DIAGNOSIS — B9689 Other specified bacterial agents as the cause of diseases classified elsewhere: Secondary | ICD-10-CM

## 2015-09-14 LAB — URINE MICROSCOPIC-ADD ON

## 2015-09-14 LAB — POCT PREGNANCY, URINE: Preg Test, Ur: NEGATIVE

## 2015-09-14 LAB — URINALYSIS, ROUTINE W REFLEX MICROSCOPIC
Bilirubin Urine: NEGATIVE
GLUCOSE, UA: NEGATIVE mg/dL
Hgb urine dipstick: NEGATIVE
Ketones, ur: NEGATIVE mg/dL
Nitrite: NEGATIVE
PROTEIN: NEGATIVE mg/dL
pH: 6 (ref 5.0–8.0)

## 2015-09-14 LAB — WET PREP, GENITAL
CLUE CELLS WET PREP: NONE SEEN
SPERM: NONE SEEN
Trich, Wet Prep: NONE SEEN
YEAST WET PREP: NONE SEEN

## 2015-09-14 MED ORDER — METRONIDAZOLE 500 MG PO TABS: 500.0000 mg | ORAL_TABLET | Freq: Two times a day (BID) | ORAL | 0 refills | Status: DC

## 2015-09-14 NOTE — MAU Note (Signed)
PT  SAYS SHE  GETS  VAG D/C   ALL TIME-   - EACH MTH-        NO   DR.

## 2015-09-14 NOTE — Discharge Instructions (Signed)
Bacterial Vaginosis Bacterial vaginosis is a vaginal infection that occurs when the normal balance of bacteria in the vagina is disrupted. It results from an overgrowth of certain bacteria. This is the most common vaginal infection in women of childbearing age. Treatment is important to prevent complications, especially in pregnant women, as it can cause a premature delivery. CAUSES  Bacterial vaginosis is caused by an increase in harmful bacteria that are normally present in smaller amounts in the vagina. Several different kinds of bacteria can cause bacterial vaginosis. However, the reason that the condition develops is not fully understood. RISK FACTORS Certain activities or behaviors can put you at an increased risk of developing bacterial vaginosis, including:  Having a new sex partner or multiple sex partners.  Douching.  Using an intrauterine device (IUD) for contraception. Women do not get bacterial vaginosis from toilet seats, bedding, swimming pools, or contact with objects around them. SIGNS AND SYMPTOMS  Some women with bacterial vaginosis have no signs or symptoms. Common symptoms include:  Grey vaginal discharge.  A fishlike odor with discharge, especially after sexual intercourse.  Itching or burning of the vagina and vulva.  Burning or pain with urination. DIAGNOSIS  Your health care provider will take a medical history and examine the vagina for signs of bacterial vaginosis. A sample of vaginal fluid may be taken. Your health care provider will look at this sample under a microscope to check for bacteria and abnormal cells. A vaginal pH test may also be done.  TREATMENT  Bacterial vaginosis may be treated with antibiotic medicines. These may be given in the form of a pill or a vaginal cream. A second round of antibiotics may be prescribed if the condition comes back after treatment. Because bacterial vaginosis increases your risk for sexually transmitted diseases, getting  treated can help reduce your risk for chlamydia, gonorrhea, HIV, and herpes. HOME CARE INSTRUCTIONS   Only take over-the-counter or prescription medicines as directed by your health care provider.  If antibiotic medicine was prescribed, take it as directed. Make sure you finish it even if you start to feel better.  Tell all sexual partners that you have a vaginal infection. They should see their health care provider and be treated if they have problems, such as a mild rash or itching.  During treatment, it is important that you follow these instructions:  Avoid sexual activity or use condoms correctly.  Do not douche.  Avoid alcohol as directed by your health care provider.  Avoid breastfeeding as directed by your health care provider. SEEK MEDICAL CARE IF:   Your symptoms are not improving after 3 days of treatment.  You have increased discharge or pain.  You have a fever. MAKE SURE YOU:   Understand these instructions.  Will watch your condition.  Will get help right away if you are not doing well or get worse. FOR MORE INFORMATION  Centers for Disease Control and Prevention, Division of STD Prevention: AppraiserFraud.fi American Sexual Health Association (ASHA): www.ashastd.org    This information is not intended to replace advice given to you by your health care provider. Make sure you discuss any questions you have with your health care provider.   Document Released: 01/09/2005 Document Revised: 01/30/2014 Document Reviewed: 08/21/2012 Elsevier Interactive Patient Education 2016 [employer].  Pap Test WHY AM I HAVING THIS TEST? A pap test is sometimes called a pap smear. It is a screening test that is used to check for signs of cancer of the vagina, cervix, and  uterus. The test can also identify the presence of infection or precancerous changes. Your health care provider will likely recommend you have this test done on a regular basis. This test may be done:  Every 3  years, starting at age 35.  Every 5 years, in combination with testing for the presence of human papillomavirus (HPV).  More or less often depending on other medical conditions.  WHAT KIND OF SAMPLE IS TAKEN? Using a small cotton swab, plastic spatula, or brush, your health care provider will collect a sample of cells from the surface of your cervix. Your cervix is the opening to your uterus, also called a womb. Secretions from the cervix and vagina may also be collected. HOW DO I PREPARE FOR THE TEST?  Be aware of where you are in your menstrual cycle. You may be asked to reschedule the test if you are menstruating on the day of the test.  You may need to reschedule if you have a known vaginal infection on the day of the test.  You may be asked to avoid douching or taking a bath the day before or the day of the test.  Some medicines can cause abnormal test results, such as digitalis and tetracycline. Talk with your health care provider before your test if you take one of these medicines. WHAT DO THE RESULTS MEAN? Abnormal test results may indicate a number of health conditions. These may include:  Cancer. Although pap test results cannot be used to diagnose cancer of the cervix, vagina, or uterus, they may suggest the possibility of cancer. Further tests would be required to determine if cancer is present.  Sexually transmitted disease.  Fungal infection.  Parasite infection.  Herpes infection.  A condition causing or contributing to infertility. It is your responsibility to obtain your test results. Ask the lab or department performing the test when and how you will get your results. Contact your health care provider to discuss any questions you have about your results.   This information is not intended to replace advice given to you by your health care provider. Make sure you discuss any questions you have with your health care provider.   Document Released: 04/01/2002 Document  Revised: 01/30/2014 Document Reviewed: 06/02/2013 Elsevier Interactive Patient Education [employer].

## 2015-09-14 NOTE — MAU Provider Note (Signed)
S:  Barbara Valdez is a 40 y.o. female G2P2002 here with vaginal odor and vaginal discharge. This happens every single month following her period. The odor is fish like.  She has been treated for BV in the past. She has not had GYN care in a while. She denies vaginal bleeding.  She denies dysuria   She does not douche. She has increased her yogurt intake recently.   O:  GENERAL: Well-developed, well-nourished female in no acute distress.  LUNGS: Effort normal SKIN: Warm, dry and without erythema PSYCH: Normal mood and affect  Vitals:   09/14/15 2318  BP: 108/56  Pulse: 90  Resp: 20  Temp: 98.5 F (36.9 C)  TempSrc: Oral  Weight: 150 lb (68 kg)  Height: 5' 6"  (1.676 m)   Results for orders placed or performed during the hospital encounter of 09/14/15 (from the past 48 hour(s))  Urinalysis, Routine w reflex microscopic (not at [facility])     Status: Abnormal   Collection Time: 09/14/15 11:20 PM  Result Value Ref Range   Color, Urine YELLOW YELLOW   APPearance CLEAR CLEAR   Specific Gravity, Urine >1.030 (H) 1.005 - 1.030   pH 6.0 5.0 - 8.0   Glucose, UA NEGATIVE NEGATIVE mg/dL   Hgb urine dipstick NEGATIVE NEGATIVE   Bilirubin Urine NEGATIVE NEGATIVE   Ketones, ur NEGATIVE NEGATIVE mg/dL   Protein, ur NEGATIVE NEGATIVE mg/dL   Nitrite NEGATIVE NEGATIVE   Leukocytes, UA MODERATE (A) NEGATIVE  Urine microscopic-add on     Status: Abnormal   Collection Time: 09/14/15 11:20 PM  Result Value Ref Range   Squamous Epithelial / LPF 0-5 (A) NONE SEEN   WBC, UA 0-5 0 - 5 WBC/hpf   RBC / HPF 0-5 0 - 5 RBC/hpf   Bacteria, UA FEW (A) NONE SEEN  Wet prep, genital     Status: Abnormal   Collection Time: 09/14/15 11:24 PM  Result Value Ref Range   Yeast Wet Prep HPF POC NONE SEEN NONE SEEN   Trich, Wet Prep NONE SEEN NONE SEEN   Clue Cells Wet Prep HPF POC NONE SEEN NONE SEEN   WBC, Wet Prep HPF POC FEW (A) NONE SEEN    Comment: FEW BACTERIA SEEN   Sperm NONE SEEN   Pregnancy,  urine POC     Status: None   Collection Time: 09/14/15 11:32 PM  Result Value Ref Range   Preg Test, Ur NEGATIVE NEGATIVE    Comment:        THE SENSITIVITY OF THIS METHODOLOGY IS >24 mIU/mL      MDM:  UA Wet prep and GC collected by patient in the bathroom.   A:  Presumed BV: wet prep normal   P:  Discharge home in stable condition Discussed the importance of routine GYN care with the importance of establishing care with GYN office or PCP Rx: flagyl- no alcohol Return to MAU for emergencies only  Lezlie Lye, NP 09/15/2015 1:13 AM

## 2015-09-15 LAB — GC/CHLAMYDIA PROBE AMP (~~LOC~~) NOT AT ARMC
Chlamydia: NEGATIVE
Neisseria Gonorrhea: NEGATIVE

## 2016-04-17 ENCOUNTER — Inpatient Hospital Stay (HOSPITAL_COMMUNITY)
Admission: AD | Admit: 2016-04-17 | Discharge: 2016-04-17 | Disposition: A | Discharge status: Home / Self Care | Payer: Self-pay | Source: Ambulatory Visit | Attending: Obstetrics and Gynecology | Admitting: Obstetrics and Gynecology

## 2016-04-17 DIAGNOSIS — N760 Acute vaginitis: Secondary | ICD-10-CM

## 2016-04-17 DIAGNOSIS — Z3202 Encounter for pregnancy test, result negative: Secondary | ICD-10-CM | POA: Insufficient documentation

## 2016-04-17 DIAGNOSIS — B9689 Other specified bacterial agents as the cause of diseases classified elsewhere: Secondary | ICD-10-CM

## 2016-04-17 LAB — URINALYSIS, ROUTINE W REFLEX MICROSCOPIC
BILIRUBIN URINE: NEGATIVE
Glucose, UA: NEGATIVE mg/dL
Ketones, ur: NEGATIVE mg/dL
NITRITE: NEGATIVE
PROTEIN: NEGATIVE mg/dL
Specific Gravity, Urine: 1.005 (ref 1.005–1.030)
pH: 5 (ref 5.0–8.0)

## 2016-04-17 LAB — WET PREP, GENITAL
Clue Cells Wet Prep HPF POC: NONE SEEN
Sperm: NONE SEEN
Trich, Wet Prep: NONE SEEN
YEAST WET PREP: NONE SEEN

## 2016-04-17 LAB — POCT PREGNANCY, URINE: Preg Test, Ur: NEGATIVE

## 2016-04-17 MED ORDER — METRONIDAZOLE 500 MG PO TABS: 500.0000 mg | ORAL_TABLET | Freq: Two times a day (BID) | ORAL | 0 refills | Status: DC

## 2016-04-17 NOTE — MAU Note (Signed)
Pt presents complaining of vaginal discharge with an odor. States she gets BV and yeast at the same time every 6 months. States she also got her period twice this month. Denies pain.

## 2016-04-17 NOTE — MAU Provider Note (Signed)
History     CSN: [id number]  Arrival date and time: 04/17/16 2043   First Provider Initiated Contact with Patient 04/17/16 2204      Chief Complaint  Patient presents with  . Vaginal Discharge   Vaginal Discharge  The patient's primary symptoms include a genital odor and vaginal discharge. The patient's pertinent negatives include no pelvic pain. This is a new problem. Episode onset: about 2 weeks ago. The problem occurs constantly. The problem has been unchanged. The patient is experiencing no pain. She is not pregnant. Pertinent negatives include no chills, dysuria, fever, frequency, nausea, urgency or vomiting. The vaginal discharge was white, thick and milky. The vaginal bleeding is spotting. Nothing aggravates the symptoms. Treatments tried: rephresh capsules  She uses tubal ligation for contraception. Her menstrual history has been irregular (03/30/17 ).   No past medical history on file.  Past Surgical History:  Procedure Laterality Date  . BREAST BIOPSY Left   . TUBAL LIGATION      Family History  Problem Relation Age of Onset  . Hyperlipidemia Father   . Hypertension Other     Social History  Substance Use Topics  . Smoking status: Never Smoker  . Smokeless tobacco: Never Used  . Alcohol use No    Allergies: No Known Allergies  Prescriptions Prior to Admission  Medication Sig Dispense Refill Last Dose  . cyclobenzaprine (FLEXERIL) 5 MG tablet Take 1 tablet (5 mg total) by mouth 3 (three) times daily as needed for muscle spasms. 30 tablet 0   . metroNIDAZOLE (FLAGYL) 500 MG tablet Take 1 tablet (500 mg total) by mouth 2 (two) times daily. 14 tablet 0   . Multiple Vitamins-Minerals (ALIVE WOMENS GUMMY) CHEW Chew 1 tablet by mouth 2 (two) times daily.   09/27/2014 at Unknown time  . naproxen (NAPROSYN) 375 MG tablet Take 1 tablet (375 mg total) by mouth 2 (two) times daily with a meal. 30 tablet 0   . terconazole (TERAZOL 7) 0.4 % vaginal cream Place 1 applicator  vaginally at bedtime. Apply to external surface as well. 45 g 0     Review of Systems  Constitutional: Negative for chills and fever.  Gastrointestinal: Negative for nausea and vomiting.  Genitourinary: Positive for vaginal discharge. Negative for dysuria, frequency, pelvic pain and urgency.   Physical Exam   Blood pressure 118/78, pulse 86, temperature 98 F (36.7 C), resp. rate 18, last menstrual period 03/31/2016.  Physical Exam   Results for orders placed or performed during the hospital encounter of 04/17/16 (from the past 24 hour(s))  Urinalysis, Routine w reflex microscopic     Status: Abnormal   Collection Time: 04/17/16  8:50 PM  Result Value Ref Range   Color, Urine STRAW (A) YELLOW   APPearance HAZY (A) CLEAR   Specific Gravity, Urine 1.005 1.005 - 1.030   pH 5.0 5.0 - 8.0   Glucose, UA NEGATIVE NEGATIVE mg/dL   Hgb urine dipstick MODERATE (A) NEGATIVE   Bilirubin Urine NEGATIVE NEGATIVE   Ketones, ur NEGATIVE NEGATIVE mg/dL   Protein, ur NEGATIVE NEGATIVE mg/dL   Nitrite NEGATIVE NEGATIVE   Leukocytes, UA MODERATE (A) NEGATIVE   RBC / HPF 0-5 0 - 5 RBC/hpf   WBC, UA 0-5 0 - 5 WBC/hpf   Bacteria, UA RARE (A) NONE SEEN   Squamous Epithelial / LPF 6-30 (A) NONE SEEN   Mucous PRESENT   Wet prep, genital     Status: Abnormal   Collection Time: 04/17/16  8:50  PM  Result Value Ref Range   Yeast Wet Prep HPF POC NONE SEEN NONE SEEN   Trich, Wet Prep NONE SEEN NONE SEEN   Clue Cells Wet Prep HPF POC NONE SEEN NONE SEEN   WBC, Wet Prep HPF POC FEW (A) NONE SEEN   Sperm NONE SEEN   Pregnancy, urine POC     Status: None   Collection Time: 04/17/16  9:05 PM  Result Value Ref Range   Preg Test, Ur NEGATIVE NEGATIVE    MAU Course  Procedures  MDM Patient states that usually the wet prep is normal, but flagyl always takes the odor away. She would like to try flagyl again for the odor.   Assessment and Plan   1. Bacterial vaginal infection    DC home Comfort  measures reviewed  1st/2nd/3rd Trimester precautions  Bleeding precautions Ectopic precautions PTL precautions  Fetal kick counts RX: flagyl 56m BID x 7 days  Return to MAU as needed FU with OB as planned  Follow-up Information    HARPER,CHARLES A, MD Follow up.   Specialty:  Obstetrics and Gynecology Contact information: [id number]            HMathis Bud3/26/2018, 10:06 PM

## 2016-04-17 NOTE — Discharge Instructions (Signed)
Prenatal Care Providers [facility]  Phone762 [phone]     Phone: [place]                      [facility]  @[email or web address]     Phone: [phone]  Phone: [place]     Phone: [phone]  Phone: [facility] [place]                hone: [phone]  Phone: [phone]         [facility] [address]      Phone: [phone]  Phone: [phone]         [place] [facility]                Phone: [phone]  [place])          Phone: [phone] [facility]   Phone: [phone]   Bacterial Vaginosis Bacterial vaginosis is a vaginal infection that occurs when the normal balance of bacteria in the vagina is disrupted. It results from an overgrowth of certain bacteria. This is the most common vaginal infection among women ages 81-44. Because bacterial vaginosis increases your risk for STIs (sexually transmitted infections), getting treated can help reduce your risk for chlamydia, gonorrhea, herpes, and HIV (human immunodeficiency virus). Treatment is also important for preventing complications in pregnant women, because this condition can cause an early (premature) delivery. What are the causes? This condition is caused by an increase in harmful bacteria that are normally present in small amounts in the vagina. However, the reason that the condition develops is not fully understood. What increases the risk? The following factors may make you more likely to develop this condition:  Having a new sexual partner or multiple sexual partners.  Having unprotected sex.  Douching.  Having an intrauterine device (IUD).  Smoking.  Drug and alcohol  abuse.  Taking certain antibiotic medicines.  Being pregnant. You cannot get bacterial vaginosis from toilet seats, bedding, swimming pools, or contact with objects around you. What are the signs or symptoms? Symptoms of this condition include:  Grey or white vaginal discharge. The discharge can also be watery or foamy.  A fish-like odor with discharge, especially after sexual intercourse or during menstruation.  Itching in and around the vagina.  Burning or pain with urination. Some women with bacterial vaginosis have no signs or symptoms. How is this diagnosed? This condition is diagnosed based on:  Your medical history.  A physical exam of the vagina.  Testing a sample of vaginal fluid under a microscope to look for a large amount of bad bacteria or abnormal cells. Your health care provider may use a cotton swab or a small wooden spatula to collect the sample. How is this treated? This condition is treated with antibiotics. These may be given as a pill, a vaginal cream, or a medicine that is put into the vagina (suppository). If the condition comes back after treatment, a second round of antibiotics may be needed. Follow these instructions at home: Medicines   Take over-the-counter and prescription medicines  only as told by your health care provider.  Take or use your antibiotic as told by your health care provider. Do not stop taking or using the antibiotic even if you start to feel better. General instructions   If you have a female sexual partner, tell her that you have a vaginal infection. She should see her health care provider and be treated if she has symptoms. If you have a female sexual partner, he does not need treatment.  During treatment:  Avoid sexual activity until you finish treatment.  Do not douche.  Avoid alcohol as directed by your health care provider.  Avoid breastfeeding as directed by your health care provider.  Drink enough water and fluids to  keep your urine clear or pale yellow.  Keep the area around your vagina and rectum clean.  Wash the area daily with warm water.  Wipe yourself from front to back after using the toilet.  Keep all follow-up visits as told by your health care provider. This is important. How is this prevented?  Do not douche.  Wash the outside of your vagina with warm water only.  Use protection when having sex. This includes latex condoms and dental dams.  Limit how many sexual partners you have. To help prevent bacterial vaginosis, it is best to have sex with just one partner (monogamous).  Make sure you and your sexual partner are tested for STIs.  Wear cotton or cotton-lined underwear.  Avoid wearing tight pants and pantyhose, especially during summer.  Limit the amount of alcohol that you drink.  Do not use any products that contain nicotine or tobacco, such as cigarettes and e-cigarettes. If you need help quitting, ask your health care provider.  Do not use illegal drugs. Where to find more information:  Centers for Disease Control and Prevention: AppraiserFraud.fi  American Sexual Health Association (ASHA): www.ashastd.org  U.S. Department of Health and Financial controller, Office on Women's Health: DustingSprays.pl or SecuritiesCard.it Contact a health care provider if:  Your symptoms do not improve, even after treatment.  You have more discharge or pain when urinating.  You have a fever.  You have pain in your abdomen.  You have pain during sex.  You have vaginal bleeding between periods. Summary  Bacterial vaginosis is a vaginal infection that occurs when the normal balance of bacteria in the vagina is disrupted.  Because bacterial vaginosis increases your risk for STIs (sexually transmitted infections), getting treated can help reduce your risk for chlamydia, gonorrhea, herpes, and HIV (human immunodeficiency virus). Treatment is  also important for preventing complications in pregnant women, because the condition can cause an early (premature) delivery.  This condition is treated with antibiotic medicines. These may be given as a pill, a vaginal cream, or a medicine that is put into the vagina (suppository). This information is not intended to replace advice given to you by your health care provider. Make sure you discuss any questions you have with your health care provider. Document Released: 01/09/2005 Document Revised: 09/25/2015 Document Reviewed: 09/25/2015 Elsevier Interactive Patient Education  2017 [employer].

## 2016-04-18 LAB — GC/CHLAMYDIA PROBE AMP (~~LOC~~) NOT AT ARMC
Chlamydia: NEGATIVE
NEISSERIA GONORRHEA: NEGATIVE

## 2016-06-17 ENCOUNTER — Emergency Department (HOSPITAL_COMMUNITY): Payer: Self-pay

## 2016-06-17 ENCOUNTER — Encounter (HOSPITAL_COMMUNITY): Payer: Self-pay

## 2016-06-17 ENCOUNTER — Emergency Department (HOSPITAL_COMMUNITY)
Admission: EM | Admit: 2016-06-17 | Discharge: 2016-06-17 | Disposition: A | Discharge status: Home / Self Care | Payer: Self-pay | Attending: Emergency Medicine | Admitting: Emergency Medicine

## 2016-06-17 DIAGNOSIS — Z79899 Other long term (current) drug therapy: Secondary | ICD-10-CM | POA: Insufficient documentation

## 2016-06-17 DIAGNOSIS — D500 Iron deficiency anemia secondary to blood loss (chronic): Secondary | ICD-10-CM | POA: Insufficient documentation

## 2016-06-17 LAB — CBC WITH DIFFERENTIAL/PLATELET
BASOS ABS: 0 10*3/uL (ref 0.0–0.1)
BASOS PCT: 1 %
EOS ABS: 0 10*3/uL (ref 0.0–0.7)
Eosinophils Relative: 1 %
HCT: 22.5 % — ABNORMAL LOW (ref 36.0–46.0)
HEMOGLOBIN: 6.1 g/dL — AB (ref 12.0–15.0)
LYMPHS PCT: 27 %
Lymphs Abs: 1 10*3/uL (ref 0.7–4.0)
MCH: 16.7 pg — ABNORMAL LOW (ref 26.0–34.0)
MCHC: 27.1 g/dL — ABNORMAL LOW (ref 30.0–36.0)
MCV: 61.6 fL — ABNORMAL LOW (ref 78.0–100.0)
Monocytes Absolute: 0.3 10*3/uL (ref 0.1–1.0)
Monocytes Relative: 9 %
NEUTROS PCT: 62 %
Neutro Abs: 2.4 10*3/uL (ref 1.7–7.7)
Platelets: 319 10*3/uL (ref 150–400)
RBC: 3.65 MIL/uL — AB (ref 3.87–5.11)
RDW: 23 % — ABNORMAL HIGH (ref 11.5–15.5)
WBC: 3.7 10*3/uL — ABNORMAL LOW (ref 4.0–10.5)

## 2016-06-17 LAB — URINALYSIS, ROUTINE W REFLEX MICROSCOPIC
Bilirubin Urine: NEGATIVE
GLUCOSE, UA: NEGATIVE mg/dL
HGB URINE DIPSTICK: NEGATIVE
Ketones, ur: NEGATIVE mg/dL
Leukocytes, UA: NEGATIVE
Nitrite: NEGATIVE
Protein, ur: NEGATIVE mg/dL
SPECIFIC GRAVITY, URINE: 1.006 (ref 1.005–1.030)
pH: 7 (ref 5.0–8.0)

## 2016-06-17 LAB — I-STAT BETA HCG BLOOD, ED (MC, WL, AP ONLY): I-stat hCG, quantitative: <5 m[IU]/mL (ref <5)

## 2016-06-17 LAB — COMPREHENSIVE METABOLIC PANEL
ALBUMIN: 3.7 g/dL (ref 3.5–5.0)
ALK PHOS: 34 U/L — AB (ref 38–126)
ALT: 7 U/L — ABNORMAL LOW (ref 14–54)
AST: 20 U/L (ref 15–41)
Anion gap: 7 (ref 5–15)
BUN: 12 mg/dL (ref 6–20)
CALCIUM: 9.2 mg/dL (ref 8.9–10.3)
CO2: 24 mmol/L (ref 22–32)
Chloride: 106 mmol/L (ref 101–111)
Creatinine, Ser: 0.68 mg/dL (ref 0.44–1.00)
GFR calc Af Amer: >60 mL/min (ref >60)
GFR calc non Af Amer: >60 mL/min (ref >60)
GLUCOSE: 100 mg/dL — AB (ref 65–99)
Potassium: 4.5 mmol/L (ref 3.5–5.1)
SODIUM: 137 mmol/L (ref 135–145)
Total Bilirubin: 0.3 mg/dL (ref 0.3–1.2)
Total Protein: 7.6 g/dL (ref 6.5–8.1)

## 2016-06-17 LAB — POC OCCULT BLOOD, ED: FECAL OCCULT BLD: NEGATIVE

## 2016-06-17 LAB — CBG MONITORING, ED: GLUCOSE-CAPILLARY: 105 mg/dL — AB (ref 65–99)

## 2016-06-17 MED ORDER — FERROUS SULFATE 325 (65 FE) MG PO TABS: 325.0000 mg | ORAL_TABLET | Freq: Three times a day (TID) | ORAL | 0 refills | Status: DC

## 2016-06-17 MED ORDER — SODIUM CHLORIDE 0.9 % IV BOLUS (SEPSIS)
1000.0000 mL | Freq: Once | INTRAVENOUS | Status: AC
  Administered 2016-06-17: 1000 mL via INTRAVENOUS

## 2016-06-17 NOTE — Discharge Instructions (Signed)
Follow-up with the women's clinic next week. Return to [facility] if you have any problems

## 2016-06-17 NOTE — ED Triage Notes (Signed)
She reports a "dizzy spell" with n/v ~ 1 week ago which improved. The dizziness recurred today and persists.

## 2016-06-17 NOTE — ED Provider Notes (Signed)
[facility] DEPT Provider Note   CSN: [id number] Arrival date & time: 06/17/16  6144     History   Chief Complaint Chief Complaint  Patient presents with  . Dizziness    HPI Barbara Valdez is a 41 y.o. female.  Patient complains of dizziness and weakness. She does state that she has a history of heavy menses and needs a hysterectomy. She has been put on our before but does not take it now occurs she did not like it   The history is provided by the patient. No language interpreter was used.  Dizziness  Quality:  Lightheadedness Severity:  Moderate Onset quality:  Sudden Timing:  Intermittent Progression:  Waxing and waning Chronicity:  New Context: not when bending over   Associated symptoms: no chest pain, no diarrhea and no headaches     History reviewed. No pertinent past medical history.  There are no active problems to display for this patient.   Past Surgical History:  Procedure Laterality Date  . BREAST BIOPSY Left   . TUBAL LIGATION      OB History    Gravida Para Term Preterm AB Living   2 2 2     2    SAB TAB Ectopic Multiple Live Births           2       Home Medications    Prior to Admission medications   Medication Sig Start Date End Date Taking? Authorizing Provider  Multiple Vitamins-Minerals (ALIVE WOMENS GUMMY) CHEW Chew 1 tablet by mouth 2 (two) times daily.   Yes [provider]  cyclobenzaprine (FLEXERIL) 5 MG tablet Take 1 tablet (5 mg total) by mouth 3 (three) times daily as needed for muscle spasms. Patient not taking: Reported on 06/17/2016 12/15/14   Junius Creamer, NP  ferrous sulfate 325 (65 FE) MG tablet Take 1 tablet (325 mg total) by mouth 3 (three) times daily with meals. 06/17/16   Milton Ferguson, MD  metroNIDAZOLE (FLAGYL) 500 MG tablet Take 1 tablet (500 mg total) by mouth 2 (two) times daily. Patient not taking: Reported on 06/17/2016 04/17/16   Tresea Mall, CNM  naproxen (NAPROSYN) 375 MG tablet Take 1 tablet  (375 mg total) by mouth 2 (two) times daily with a meal. Patient not taking: Reported on 06/17/2016 12/15/14   Junius Creamer, NP  terconazole (TERAZOL 7) 0.4 % vaginal cream Place 1 applicator vaginally at bedtime. Apply to external surface as well. Patient not taking: Reported on 06/17/2016 09/27/14   Jaclyn Prime, Collene Leyden, PA-C    Family History Family History  Problem Relation Age of Onset  . Hyperlipidemia Father   . Hypertension Other     Social History Social History  Substance Use Topics  . Smoking status: Never Smoker  . Smokeless tobacco: Never Used  . Alcohol use No     Allergies   Patient has no known allergies.   Review of Systems Review of Systems  Constitutional: Negative for appetite change and fatigue.  HENT: Negative for congestion, ear discharge and sinus pressure.   Eyes: Negative for discharge.  Respiratory: Negative for cough.   Cardiovascular: Negative for chest pain.  Gastrointestinal: Negative for abdominal pain and diarrhea.  Genitourinary: Negative for frequency and hematuria.  Musculoskeletal: Negative for back pain.  Skin: Negative for rash.  Neurological: Positive for dizziness. Negative for seizures and headaches.  Psychiatric/Behavioral: Negative for hallucinations.     Physical Exam Updated Vital Signs BP 107/74 (BP Location: Left Arm)  Pulse 76   Temp 97.8 F (36.6 C) (Oral)   Resp 14   Ht 5\' 5"  (1.651 m)   Wt 71.2 kg (157 lb)   LMP 06/03/2016 (Exact Date)   SpO2 99%   BMI 26.13 kg/m   Physical Exam  Constitutional: She is oriented to person, place, and time. She appears well-developed.  HENT:  Head: Normocephalic.  Eyes: Conjunctivae and EOM are normal. No scleral icterus.  Neck: Neck supple. No thyromegaly present.  Cardiovascular: Normal rate and regular rhythm.  Exam reveals no gallop and no friction rub.   No murmur heard. Pulmonary/Chest: No stridor. She has no wheezes. She has no rales. She exhibits no tenderness.    Abdominal: She exhibits no distension. There is no tenderness. There is no rebound.  Genitourinary:  Genitourinary Comments: Rectal exam heme-negative.  Musculoskeletal: Normal range of motion. She exhibits no edema.  Lymphadenopathy:    She has no cervical adenopathy.  Neurological: She is oriented to person, place, and time. She exhibits normal muscle tone. Coordination normal.  Skin: No rash noted. No erythema.  Psychiatric: She has a normal mood and affect. Her behavior is normal.     ED Treatments / Results  Labs (all labs ordered are listed, but only abnormal results are displayed) Labs Reviewed  CBC WITH DIFFERENTIAL/PLATELET - Abnormal; Notable for the following:       Result Value   WBC 3.7 (*)    RBC 3.65 (*)    Hemoglobin 6.1 (*)    HCT 22.5 (*)    MCV 61.6 (*)    MCH 16.7 (*)    MCHC 27.1 (*)    RDW 23.0 (*)    All other components within normal limits  COMPREHENSIVE METABOLIC PANEL - Abnormal; Notable for the following:    Glucose, Bld 100 (*)    ALT 7 (*)    Alkaline Phosphatase 34 (*)    All other components within normal limits  URINALYSIS, ROUTINE W REFLEX MICROSCOPIC - Abnormal; Notable for the following:    Color, Urine STRAW (*)    All other components within normal limits  CBG MONITORING, ED - Abnormal; Notable for the following:    Glucose-Capillary 105 (*)    All other components within normal limits  OCCULT BLOOD X 1 CARD TO LAB, STOOL  I-STAT BETA HCG BLOOD, ED ([facility], [facility], [facility] ONLY)    EKG  EKG Interpretation  Date/Time:  Saturday Jun 17 2016 11:32:32 EDT Ventricular Rate:  67 PR Interval:    QRS Duration: 87 QT Interval:  414 QTC Calculation: 437 R Axis:   67 Text Interpretation:  Sinus rhythm Confirmed by Eilah Common  MD, Roiza Wiedel [phone]) on 06/17/2016 1:52:34 PM       Radiology Ct Head Wo Contrast  Result Date: 06/17/2016 CLINICAL DATA:  Dizziness, nausea, vomiting EXAM: CT HEAD WITHOUT CONTRAST TECHNIQUE: Contiguous axial images were  obtained from the base of the skull through the vertex without intravenous contrast. COMPARISON:  None. FINDINGS: Brain: No evidence of acute infarction, hemorrhage, hydrocephalus, extra-axial collection or mass lesion/mass effect. Vascular: No hyperdense vessel or unexpected calcification. Skull: No osseous abnormality. Sinuses/Orbits: Visualized paranasal sinuses are clear. Visualized mastoid sinuses are clear. Visualized orbits demonstrate no focal abnormality. Other: None IMPRESSION: No acute intracranial pathology. Electronically Signed   By: Kathreen Devoid   On: 06/17/2016 13:40    Procedures Procedures (including critical care time)  Medications Ordered in ED Medications  sodium chloride 0.9 % bolus 1,000 mL (1,000 mLs Intravenous  New Bag/Given 06/17/16 1151)     Initial Impression / Assessment and Plan / ED Course  I have reviewed the triage vital signs and the nursing notes.  Pertinent labs & imaging results that were available during my care of the patient were reviewed by me and considered in my medical decision making (see chart for details).     Patient with anemia. The anemia is most likely secondary to heavy menses. She states she was told she needs a hysterectomy. I offered the patient [facility] admission and transfusion. She did not want to get transfused right now. She is given a prescription iron and is going to [facility]  Final Clinical Impressions(s) / ED Diagnoses   Final diagnoses:  Iron deficiency anemia due to chronic blood loss    New Prescriptions New Prescriptions   FERROUS SULFATE 325 (65 FE) MG TABLET    Take 1 tablet (325 mg total) by mouth 3 (three) times daily with meals.     Milton Ferguson, MD 06/17/16 1407

## 2016-07-11 ENCOUNTER — Encounter (INDEPENDENT_AMBULATORY_CARE_PROVIDER_SITE_OTHER): Payer: Self-pay | Admitting: Obstetrics & Gynecology

## 2016-07-18 ENCOUNTER — Encounter: Payer: Self-pay | Admitting: Obstetrics & Gynecology

## 2016-07-18 ENCOUNTER — Ambulatory Visit (INDEPENDENT_AMBULATORY_CARE_PROVIDER_SITE_OTHER): Payer: Self-pay | Admitting: Obstetrics & Gynecology

## 2016-07-18 VITALS — BP 104/61 | HR 89 | Wt 156.0 lb

## 2016-07-18 DIAGNOSIS — D509 Iron deficiency anemia, unspecified: Secondary | ICD-10-CM | POA: Insufficient documentation

## 2016-07-18 DIAGNOSIS — Z124 Encounter for screening for malignant neoplasm of cervix: Secondary | ICD-10-CM

## 2016-07-18 DIAGNOSIS — Z0000 Encounter for general adult medical examination without abnormal findings: Secondary | ICD-10-CM

## 2016-07-18 DIAGNOSIS — Z113 Encounter for screening for infections with a predominantly sexual mode of transmission: Secondary | ICD-10-CM

## 2016-07-18 DIAGNOSIS — D649 Anemia, unspecified: Secondary | ICD-10-CM

## 2016-07-18 DIAGNOSIS — D250 Submucous leiomyoma of uterus: Secondary | ICD-10-CM

## 2016-07-18 DIAGNOSIS — N920 Excessive and frequent menstruation with regular cycle: Secondary | ICD-10-CM

## 2016-07-18 DIAGNOSIS — Z1151 Encounter for screening for human papillomavirus (HPV): Secondary | ICD-10-CM

## 2016-07-18 HISTORY — DX: Excessive and frequent menstruation with regular cycle: N92.0

## 2016-07-18 HISTORY — DX: Submucous leiomyoma of uterus: D25.0

## 2016-07-18 MED ORDER — MISOPROSTOL 200 MCG PO TABS: ORAL_TABLET | ORAL | 0 refills | Status: DC

## 2016-07-18 MED ORDER — METRONIDAZOLE 500 MG PO TABS: 500.0000 mg | ORAL_TABLET | Freq: Two times a day (BID) | ORAL | 6 refills | Status: DC

## 2016-07-18 NOTE — Addendum Note (Signed)
Addended by: Michel Harrow on: 07/18/2016 09:23 AM   Modules accepted: Orders

## 2016-07-18 NOTE — Progress Notes (Signed)
   Subjective:    Patient ID: Barbara Valdez, female    DOB: [birth year], 41 y.o.   MRN: [id number]  HPI 41 yo S AA P2 (22 and 53yo kids) here today to discuss her heavy bleeding/periods. They last 10days. Her hbg last month was 6.1. She refused blood then and was started on iron pills TID. She is feeling much better.   Review of Systems She has had a BTL. FH- breast cancer in m aunt, no gyn or colon cancers    Objective:   Physical Exam WNWHBFNAD Breathing, conversing, and ambulating normally Bulky uterus, about 12 week size, not a good candidate for vaginal approach       Assessment & Plan:  Menorrhagia with anemia- h/o submucosal fibroid- will recheck CBC and gyn u/s Schedule a embx (pretreat with cytotec) Pap and mammogram Plan for TAH

## 2016-07-18 NOTE — Progress Notes (Addendum)
[place] scheduled for July 3rd 0800.  Pt notified.  Mammogram scholarship faxed to the [facility].

## 2016-07-19 LAB — TSH: TSH: 0.958 u[IU]/mL (ref 0.450–4.500)

## 2016-07-19 LAB — CBC
HEMATOCRIT: 37.7 % (ref 34.0–46.6)
HEMOGLOBIN: 10.7 g/dL — AB (ref 11.1–15.9)
MCH: 22.6 pg — AB (ref 26.6–33.0)
MCHC: 28.4 g/dL — AB (ref 31.5–35.7)
MCV: 80 fL (ref 79–97)
PLATELETS: 262 10*3/uL (ref 150–379)
RBC: 4.74 x10E6/uL (ref 3.77–5.28)
WBC: 3.7 10*3/uL (ref 3.4–10.8)

## 2016-07-25 ENCOUNTER — Other Ambulatory Visit: Payer: Self-pay

## 2016-07-25 ENCOUNTER — Ambulatory Visit (HOSPITAL_COMMUNITY)
Admission: RE | Admit: 2016-07-25 | Discharge: 2016-07-25 | Disposition: A | Discharge status: Home / Self Care | Payer: Self-pay | Source: Ambulatory Visit | Attending: Obstetrics & Gynecology | Admitting: Obstetrics & Gynecology

## 2016-07-25 ENCOUNTER — Encounter: Payer: Self-pay | Admitting: Obstetrics & Gynecology

## 2016-07-25 DIAGNOSIS — R938 Abnormal findings on diagnostic imaging of other specified body structures: Secondary | ICD-10-CM | POA: Insufficient documentation

## 2016-07-25 DIAGNOSIS — D259 Leiomyoma of uterus, unspecified: Secondary | ICD-10-CM | POA: Insufficient documentation

## 2016-07-25 DIAGNOSIS — N920 Excessive and frequent menstruation with regular cycle: Secondary | ICD-10-CM | POA: Insufficient documentation

## 2016-07-25 DIAGNOSIS — B977 Papillomavirus as the cause of diseases classified elsewhere: Secondary | ICD-10-CM | POA: Insufficient documentation

## 2016-07-25 LAB — CYTOLOGY - PAP
Chlamydia: NEGATIVE
Diagnosis: NEGATIVE
HPV (WINDOPATH): DETECTED — AB
NEISSERIA GONORRHEA: NEGATIVE

## 2016-07-25 MED ORDER — MISOPROSTOL 200 MCG PO TABS: ORAL_TABLET | ORAL | 0 refills | Status: DC

## 2016-07-25 NOTE — Progress Notes (Signed)
Pt called and requested that another refill be placed for Cytotec because she thought her provider appt was today instead it was an [place] appt and she took Cytotec.  Re-ordered Cytotec and reiterate pt's appt scheduled on 08/11/16 to take medication the night before.

## 2016-07-27 ENCOUNTER — Telehealth: Payer: Self-pay

## 2016-07-27 NOTE — Telephone Encounter (Signed)
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  Pt stated understanding with no further questions.

## 2016-07-27 NOTE — Telephone Encounter (Signed)
Her pap showed + HR HPV. She will need a pap again next year.   Called patient no answer left a message for patient to call back regarding results.

## 2016-08-07 ENCOUNTER — Encounter (HOSPITAL_COMMUNITY): Payer: Self-pay

## 2016-08-11 ENCOUNTER — Ambulatory Visit: Payer: Self-pay | Admitting: Obstetrics & Gynecology

## 2016-08-18 ENCOUNTER — Ambulatory Visit: Payer: Self-pay | Admitting: Obstetrics & Gynecology

## 2016-08-18 ENCOUNTER — Other Ambulatory Visit (HOSPITAL_COMMUNITY)
Admission: RE | Admit: 2016-08-18 | Discharge: 2016-08-18 | Disposition: A | Discharge status: Home / Self Care | Payer: Self-pay | Source: Ambulatory Visit | Attending: Obstetrics & Gynecology | Admitting: Obstetrics & Gynecology

## 2016-08-18 VITALS — BP 113/66 | HR 84 | Ht 66.0 in | Wt 161.0 lb

## 2016-08-18 DIAGNOSIS — N920 Excessive and frequent menstruation with regular cycle: Secondary | ICD-10-CM | POA: Insufficient documentation

## 2016-08-18 NOTE — Progress Notes (Signed)
   Subjective:    Patient ID: Barbara Valdez, female    DOB: [birth year], 41 y.o.   MRN: [id number]  HPI  41 yo AA lady here for a EMBX due to her DUB and 23 mm endometrial stripe seen on u/s.  Review of Systems     Objective:   Physical Exam WNWHBFNAD Breathing, conversing, and ambulating normally  UPT negative, consent signed, time out done Cervix prepped with betadine and grasped with a single tooth tenaculum Uterus sounded to 9 cm Pipelle used for 2 passes with a moderate amount of tissue obtained. She tolerated the procedure well.      Assessment & Plan:  DUB- await pathology

## 2016-08-29 ENCOUNTER — Telehealth: Payer: Self-pay

## 2016-08-29 NOTE — Telephone Encounter (Signed)
Patient called because she was still having some bleeding after her endometrial bx that was done on 7/27. Called patient to see if the bleeding had stopped, patient stated that she thinks that she had gotten her period because it was time for her cycle when she came in for the bx. Patient stated that the bleeding has decreased. Patient also wanted her bx results, I explained to patient that we will call her when her results come back. Patient verbalized understanding and had no questions.

## 2016-09-06 ENCOUNTER — Ambulatory Visit: Payer: Self-pay | Admitting: Obstetrics & Gynecology

## 2016-09-11 NOTE — Patient Instructions (Signed)
Your procedure is scheduled on:  Tuesday, Sept. 4, 2018  Enter through the [employer] [facility] at:  6:00 AM  Pick up the phone at the desk and dial [phone].  Call this number if you have problems the morning of surgery: [phone].  Remember: Do NOT eat food or drink after:  Midnight Monday  Take these medicines the morning of surgery with a SIP OF WATER:  None  Stop ALL herbal medications at this time  Do NOT smoke the day of surgery.  Do NOT wear jewelry (body piercing), metal hair clips/bobby pins, make-up, artifical eyelashes or nail polish. Do NOT wear lotions, powders, or perfumes.  You may wear deodorant. Do NOT shave for 48 hours prior to surgery. Do NOT bring valuables to the hospital. Contacts, dentures, or bridgework may not be worn into surgery.  Leave suitcase in car.  After surgery it may be brought to your room.  For patients admitted to the hospital, checkout time is 11:00 AM the day of discharge.  Bring a copy of your healthcare power of attorney and living will documents.

## 2016-09-12 ENCOUNTER — Encounter (HOSPITAL_COMMUNITY)
Admission: RE | Admit: 2016-09-12 | Discharge: 2016-09-12 | Disposition: A | Discharge status: Home / Self Care | Payer: Self-pay | Source: Ambulatory Visit | Attending: Obstetrics & Gynecology | Admitting: Obstetrics & Gynecology

## 2016-09-12 ENCOUNTER — Encounter (HOSPITAL_COMMUNITY): Payer: Self-pay

## 2016-09-12 DIAGNOSIS — N920 Excessive and frequent menstruation with regular cycle: Secondary | ICD-10-CM | POA: Insufficient documentation

## 2016-09-12 DIAGNOSIS — Z01818 Encounter for other preprocedural examination: Secondary | ICD-10-CM | POA: Insufficient documentation

## 2016-09-12 HISTORY — DX: Anxiety disorder, unspecified: F41.9

## 2016-09-12 HISTORY — DX: Adverse effect of unspecified anesthetic, initial encounter: T41.45XA

## 2016-09-12 HISTORY — DX: Other complications of anesthesia, initial encounter: T88.59XA

## 2016-09-12 HISTORY — DX: Anemia, unspecified: D64.9

## 2016-09-12 LAB — TYPE AND SCREEN
ABO/RH(D): A POS
ANTIBODY SCREEN: NEGATIVE

## 2016-09-12 LAB — ABO/RH: ABO/RH(D): A POS

## 2016-09-12 LAB — CBC
HCT: 32.7 % — ABNORMAL LOW (ref 36.0–46.0)
Hemoglobin: 10.3 g/dL — ABNORMAL LOW (ref 12.0–15.0)
MCH: 25.4 pg — ABNORMAL LOW (ref 26.0–34.0)
MCHC: 31.5 g/dL (ref 30.0–36.0)
MCV: 80.5 fL (ref 78.0–100.0)
PLATELETS: 240 10*3/uL (ref 150–400)
RBC: 4.06 MIL/uL (ref 3.87–5.11)
RDW: 17.6 % — AB (ref 11.5–15.5)
WBC: 2.8 10*3/uL — ABNORMAL LOW (ref 4.0–10.5)

## 2016-09-22 NOTE — Anesthesia Preprocedure Evaluation (Signed)
Anesthesia Evaluation  Patient identified by MRN, date of birth, ID band Patient awake    Reviewed: Allergy & Precautions, H&P , Patient's Chart, lab work & pertinent test results, reviewed documented beta blocker date and time   Airway Mallampati: II  TM Distance: >3 FB Neck ROM: full    Dental no notable dental hx.    Pulmonary    Pulmonary exam normal breath sounds clear to auscultation       Cardiovascular  Rhythm:regular Rate:Normal     Neuro/Psych    GI/Hepatic   Endo/Other    Renal/GU      Musculoskeletal   Abdominal   Peds  Hematology   Anesthesia Other Findings May be candidate for SAB; slow to wake up in past  Reproductive/Obstetrics                             Anesthesia Physical Anesthesia Plan  ASA: II  Anesthesia Plan: General   Post-op Pain Management:    Induction: Intravenous  PONV Risk Score and Plan: 2 and Ondansetron and Dexamethasone  Airway Management Planned: Oral ETT  Additional Equipment:   Intra-op Plan:   Post-operative Plan: Extubation in OR  Informed Consent: I have reviewed the patients History and Physical, chart, labs and discussed the procedure including the risks, benefits and alternatives for the proposed anesthesia with the patient or authorized representative who has indicated his/her understanding and acceptance.   Dental Advisory Given  Plan Discussed with: CRNA and Surgeon  Anesthesia Plan Comments: (  )        Anesthesia Quick Evaluation

## 2016-09-26 ENCOUNTER — Encounter (HOSPITAL_COMMUNITY): Payer: Self-pay

## 2016-09-26 ENCOUNTER — Inpatient Hospital Stay (HOSPITAL_COMMUNITY): Payer: Self-pay | Admitting: Anesthesiology

## 2016-09-26 ENCOUNTER — Inpatient Hospital Stay (HOSPITAL_COMMUNITY)
Admission: RE | Admit: 2016-09-26 | Discharge: 2016-09-28 | DRG: 743 | Disposition: A | Discharge status: Home / Self Care | Payer: Self-pay | Source: Ambulatory Visit | Attending: Obstetrics & Gynecology | Admitting: Obstetrics & Gynecology

## 2016-09-26 ENCOUNTER — Encounter (HOSPITAL_COMMUNITY)
Admission: RE | Disposition: A | Discharge status: Home / Self Care | Payer: Self-pay | Source: Ambulatory Visit | Attending: Obstetrics & Gynecology

## 2016-09-26 DIAGNOSIS — N920 Excessive and frequent menstruation with regular cycle: Principal | ICD-10-CM | POA: Diagnosis present

## 2016-09-26 DIAGNOSIS — D250 Submucous leiomyoma of uterus: Secondary | ICD-10-CM | POA: Diagnosis present

## 2016-09-26 DIAGNOSIS — D649 Anemia, unspecified: Secondary | ICD-10-CM | POA: Diagnosis present

## 2016-09-26 DIAGNOSIS — Z9889 Other specified postprocedural states: Secondary | ICD-10-CM

## 2016-09-26 HISTORY — PX: ABDOMINAL HYSTERECTOMY: SHX81

## 2016-09-26 HISTORY — DX: Other specified postprocedural states: Z98.890

## 2016-09-26 LAB — PREGNANCY, URINE: Preg Test, Ur: NEGATIVE

## 2016-09-26 SURGERY — HYSTERECTOMY, ABDOMINAL
Anesthesia: General | Site: Abdomen | Laterality: Bilateral

## 2016-09-26 MED ORDER — HYDROMORPHONE HCL 1 MG/ML IJ SOLN
0.2500 mg | INTRAMUSCULAR | Status: DC | PRN
  Administered 2016-09-26: 0.5 mg via INTRAVENOUS

## 2016-09-26 MED ORDER — LACTATED RINGERS IV SOLN
INTRAVENOUS | Status: DC
  Administered 2016-09-26 (×2): via INTRAVENOUS

## 2016-09-26 MED ORDER — KETOROLAC TROMETHAMINE 30 MG/ML IJ SOLN
INTRAMUSCULAR | Status: AC
  Filled 2016-09-26: qty 1

## 2016-09-26 MED ORDER — PROPOFOL 10 MG/ML IV BOLUS
INTRAVENOUS | Status: DC | PRN
  Administered 2016-09-26: 200 mg via INTRAVENOUS

## 2016-09-26 MED ORDER — ONDANSETRON HCL 4 MG/2ML IJ SOLN
INTRAMUSCULAR | Status: AC
  Filled 2016-09-26: qty 2

## 2016-09-26 MED ORDER — MIDAZOLAM HCL 2 MG/2ML IJ SOLN
INTRAMUSCULAR | Status: DC | PRN
  Administered 2016-09-26: 2 mg via INTRAVENOUS

## 2016-09-26 MED ORDER — CEFAZOLIN SODIUM-DEXTROSE 2-4 GM/100ML-% IV SOLN
INTRAVENOUS | Status: AC
  Filled 2016-09-26: qty 100

## 2016-09-26 MED ORDER — ACETAMINOPHEN 160 MG/5ML PO SOLN
ORAL | Status: AC
  Administered 2016-09-26: 975 mg via ORAL
  Filled 2016-09-26: qty 40.6

## 2016-09-26 MED ORDER — DEXAMETHASONE SODIUM PHOSPHATE 4 MG/ML IJ SOLN
INTRAMUSCULAR | Status: AC
  Filled 2016-09-26: qty 1

## 2016-09-26 MED ORDER — EPHEDRINE SULFATE 50 MG/ML IJ SOLN
INTRAMUSCULAR | Status: DC | PRN
  Administered 2016-09-26 (×3): 10 mg via INTRAVENOUS

## 2016-09-26 MED ORDER — MIDAZOLAM HCL 2 MG/2ML IJ SOLN
INTRAMUSCULAR | Status: AC
  Filled 2016-09-26: qty 2

## 2016-09-26 MED ORDER — SUGAMMADEX SODIUM 200 MG/2ML IV SOLN
INTRAVENOUS | Status: DC | PRN
  Administered 2016-09-26: 200 mg via INTRAVENOUS

## 2016-09-26 MED ORDER — LIDOCAINE HCL (PF) 1 % IJ SOLN
INTRAMUSCULAR | Status: AC
  Filled 2016-09-26: qty 5

## 2016-09-26 MED ORDER — BUPIVACAINE HCL (PF) 0.5 % IJ SOLN
INTRAMUSCULAR | Status: DC | PRN
  Administered 2016-09-26: 30 mL

## 2016-09-26 MED ORDER — DEXAMETHASONE SODIUM PHOSPHATE 10 MG/ML IJ SOLN
INTRAMUSCULAR | Status: DC | PRN
  Administered 2016-09-26: 4 mg via INTRAVENOUS

## 2016-09-26 MED ORDER — ACETAMINOPHEN 160 MG/5ML PO SOLN
975.0000 mg | Freq: Once | ORAL | Status: AC
  Administered 2016-09-26: 975 mg via ORAL

## 2016-09-26 MED ORDER — SUGAMMADEX SODIUM 200 MG/2ML IV SOLN
INTRAVENOUS | Status: AC
  Filled 2016-09-26: qty 2

## 2016-09-26 MED ORDER — LACTATED RINGERS IV SOLN
INTRAVENOUS | Status: DC
  Administered 2016-09-26 (×2): via INTRAVENOUS

## 2016-09-26 MED ORDER — IBUPROFEN 800 MG PO TABS
800.0000 mg | ORAL_TABLET | Freq: Three times a day (TID) | ORAL | Status: DC | PRN
  Administered 2016-09-27 – 2016-09-28 (×3): 800 mg via ORAL
  Filled 2016-09-26 (×3): qty 1

## 2016-09-26 MED ORDER — EPHEDRINE 5 MG/ML INJ
INTRAVENOUS | Status: AC
  Filled 2016-09-26: qty 10

## 2016-09-26 MED ORDER — ROCURONIUM BROMIDE 100 MG/10ML IV SOLN
INTRAVENOUS | Status: DC | PRN
  Administered 2016-09-26: 40 mg via INTRAVENOUS

## 2016-09-26 MED ORDER — BUPIVACAINE HCL (PF) 0.5 % IJ SOLN
INTRAMUSCULAR | Status: AC
  Filled 2016-09-26: qty 30

## 2016-09-26 MED ORDER — HYDROMORPHONE HCL 1 MG/ML IJ SOLN
INTRAMUSCULAR | Status: AC
  Filled 2016-09-26: qty 0.5

## 2016-09-26 MED ORDER — HYDROMORPHONE HCL 1 MG/ML IJ SOLN
0.2000 mg | INTRAMUSCULAR | Status: DC | PRN
  Administered 2016-09-26 – 2016-09-27 (×3): 0.6 mg via INTRAVENOUS
  Administered 2016-09-27: 0.4 mg via INTRAVENOUS
  Filled 2016-09-26 (×4): qty 1

## 2016-09-26 MED ORDER — OXYCODONE-ACETAMINOPHEN 5-325 MG PO TABS
1.0000 | ORAL_TABLET | ORAL | Status: DC | PRN
  Administered 2016-09-27 – 2016-09-28 (×2): 1 via ORAL
  Administered 2016-09-28: 2 via ORAL
  Filled 2016-09-26: qty 1
  Filled 2016-09-26: qty 2
  Filled 2016-09-26: qty 1

## 2016-09-26 MED ORDER — FENTANYL CITRATE (PF) 250 MCG/5ML IJ SOLN
INTRAMUSCULAR | Status: AC
  Filled 2016-09-26: qty 5

## 2016-09-26 MED ORDER — FENTANYL CITRATE (PF) 100 MCG/2ML IJ SOLN
INTRAMUSCULAR | Status: DC | PRN
  Administered 2016-09-26 (×5): 50 ug via INTRAVENOUS

## 2016-09-26 MED ORDER — SCOPOLAMINE 1 MG/3DAYS TD PT72
1.0000 | MEDICATED_PATCH | Freq: Once | TRANSDERMAL | Status: DC
  Administered 2016-09-26: 1.5 mg via TRANSDERMAL

## 2016-09-26 MED ORDER — SCOPOLAMINE 1 MG/3DAYS TD PT72
MEDICATED_PATCH | TRANSDERMAL | Status: AC
  Administered 2016-09-26: 1.5 mg via TRANSDERMAL
  Filled 2016-09-26: qty 1

## 2016-09-26 MED ORDER — ONDANSETRON HCL 4 MG/2ML IJ SOLN
INTRAMUSCULAR | Status: DC | PRN
  Administered 2016-09-26: 4 mg via INTRAVENOUS

## 2016-09-26 MED ORDER — KETOROLAC TROMETHAMINE 30 MG/ML IJ SOLN
INTRAMUSCULAR | Status: DC | PRN
  Administered 2016-09-26: 30 mg via INTRAVENOUS

## 2016-09-26 MED ORDER — CEFAZOLIN SODIUM-DEXTROSE 2-4 GM/100ML-% IV SOLN
2.0000 g | INTRAVENOUS | Status: AC
  Administered 2016-09-26: 2 g via INTRAVENOUS

## 2016-09-26 MED ORDER — LIDOCAINE HCL (CARDIAC) 20 MG/ML IV SOLN
INTRAVENOUS | Status: DC | PRN
  Administered 2016-09-26: 50 mg via INTRAVENOUS

## 2016-09-26 MED ORDER — ONDANSETRON HCL 4 MG/2ML IJ SOLN
4.0000 mg | Freq: Four times a day (QID) | INTRAMUSCULAR | Status: DC | PRN
  Administered 2016-09-26: 4 mg via INTRAVENOUS
  Filled 2016-09-26: qty 2

## 2016-09-26 MED ORDER — ONDANSETRON HCL 4 MG PO TABS: 4.0000 mg | ORAL_TABLET | Freq: Four times a day (QID) | ORAL | Status: DC | PRN

## 2016-09-26 MED ORDER — PROPOFOL 10 MG/ML IV BOLUS
INTRAVENOUS | Status: AC
  Filled 2016-09-26: qty 20

## 2016-09-26 SURGICAL SUPPLY — 32 items
CANISTER SUCT 3000ML PPV (MISCELLANEOUS) ×3 IMPLANT
CLOSURE WOUND 1/2 X4 (GAUZE/BANDAGES/DRESSINGS) ×1
CLOTH BEACON ORANGE TIMEOUT ST (SAFETY) ×3 IMPLANT
CONT PATH 16OZ SNAP LID 3702 (MISCELLANEOUS) ×3 IMPLANT
DECANTER SPIKE VIAL GLASS SM (MISCELLANEOUS) IMPLANT
DRAPE CESAREAN BIRTH W POUCH (DRAPES) ×3 IMPLANT
DRAPE WARM FLUID 44X44 (DRAPE) IMPLANT
DRSG OPSITE POSTOP 4X10 (GAUZE/BANDAGES/DRESSINGS) ×3 IMPLANT
DURAPREP 26ML APPLICATOR (WOUND CARE) ×3 IMPLANT
GAUZE SPONGE 4X4 16PLY XRAY LF (GAUZE/BANDAGES/DRESSINGS) ×3 IMPLANT
GLOVE BIO SURGEON STRL SZ 6.5 (GLOVE) ×2 IMPLANT
GLOVE BIO SURGEONS STRL SZ 6.5 (GLOVE) ×1
GLOVE BIOGEL PI IND STRL 7.0 (GLOVE) ×2 IMPLANT
GLOVE BIOGEL PI INDICATOR 7.0 (GLOVE) ×4
GOWN STRL REUS W/TWL LRG LVL3 (GOWN DISPOSABLE) ×9 IMPLANT
HEMOSTAT ARISTA ABSORB 3G PWDR (MISCELLANEOUS) IMPLANT
NEEDLE SPNL 18GX3.5 QUINCKE PK (NEEDLE) ×3 IMPLANT
NS IRRIG 1000ML POUR BTL (IV SOLUTION) ×3 IMPLANT
PACK ABDOMINAL GYN (CUSTOM PROCEDURE TRAY) ×3 IMPLANT
PAD OB MATERNITY 4.3X12.25 (PERSONAL CARE ITEMS) ×3 IMPLANT
PROTECTOR NERVE ULNAR (MISCELLANEOUS) ×3 IMPLANT
SPONGE LAP 18X18 X RAY DECT (DISPOSABLE) ×6 IMPLANT
STRIP CLOSURE SKIN 1/2X4 (GAUZE/BANDAGES/DRESSINGS) ×2 IMPLANT
SUT CHROMIC 3 0 SH 27 (SUTURE) IMPLANT
SUT PDS AB 0 CTX 60 (SUTURE) IMPLANT
SUT VIC AB 0 CT1 36 (SUTURE) ×6 IMPLANT
SUT VIC AB 2-0 CT1 18 (SUTURE) ×12 IMPLANT
SUT VIC AB 3-0 CT1 27 (SUTURE) ×2
SUT VIC AB 3-0 CT1 TAPERPNT 27 (SUTURE) ×1 IMPLANT
SYR 30ML LL (SYRINGE) ×3 IMPLANT
TOWEL OR 17X24 6PK STRL BLUE (TOWEL DISPOSABLE) ×6 IMPLANT
TRAY FOLEY CATH SILVER 14FR (SET/KITS/TRAYS/PACK) ×3 IMPLANT

## 2016-09-26 NOTE — H&P (Addendum)
Barbara Valdez is an 41 y.o. S AA P2 (22 and 19yo kids) here today to discuss her heavy bleeding/periods. They last 10days. Her hbg last month was 6.1. She refused blood then and was started on iron pills TID. She is feeling much better.Her hbg is now 10. Her EMBX was normal. Her u/s showed a 12 cm uterus.    Patient's last menstrual period was 09/06/2016.    Past Medical History:  Diagnosis Date  . Anemia   . Anxiety   . Complication of anesthesia    took a long time to wake up after surgery    Past Surgical History:  Procedure Laterality Date  . BREAST BIOPSY Left   . TUBAL LIGATION      Family History  Problem Relation Age of Onset  . Hyperlipidemia Father   . Hypertension Other     Social History:  reports that she has never smoked. She has never used smokeless tobacco. She reports that she does not drink alcohol or use drugs.  Allergies: No Known Allergies  Prescriptions Prior to Admission  Medication Sig Dispense Refill Last Dose  . ferrous sulfate 325 (65 FE) MG tablet Take 1 tablet (325 mg total) by mouth 3 (three) times daily with meals. 100 tablet 0 Past Week at Unknown time  . Multiple Vitamins-Minerals (ALIVE WOMENS GUMMY) CHEW Chew 1 tablet by mouth 2 (two) times daily.   Past Month at Unknown time    ROS  Works at [employer] active, does not live together  Blood pressure 97/72, pulse 82, temperature 98.4 F (36.9 C), temperature source Oral, resp. rate 16, last menstrual period 09/06/2016, SpO2 100 %. Physical Exam Heart- rrr Lungs- CTAB Abd- benign  Results for orders placed or performed during the hospital encounter of 09/26/16 (from the past 24 hour(s))  Pregnancy, urine     Status: None   Collection Time: 09/26/16  6:00 AM  Result Value Ref Range   Preg Test, Ur NEGATIVE NEGATIVE    No results found.  Assessment/Plan: Fibroids, anemia, menorrhagia- plan for TAH/BS.  She understands the risks of surgery, including, but not to  infection, bleeding, DVTs, damage to bowel, bladder, ureters. She wishes to proceed.      Caramia Boutin C Hodges Treiber 09/26/2016, 7:00 AM

## 2016-09-26 NOTE — Anesthesia Procedure Notes (Signed)
Procedure Name: Intubation Date/Time: 09/26/2016 7:29 AM Performed by: Bufford Spikes Pre-anesthesia Checklist: Patient identified, Emergency Drugs available, Suction available and Patient being monitored Patient Re-evaluated:Patient Re-evaluated prior to induction Oxygen Delivery Method: Circle system utilized Preoxygenation: Pre-oxygenation with 100% oxygen Induction Type: IV induction Ventilation: Mask ventilation without difficulty Laryngoscope Size: Miller and 2 Grade View: Grade II Tube type: Oral Tube size: 7.0 mm Number of attempts: 1 Airway Equipment and Method: Stylet Placement Confirmation: ETT inserted through vocal cords under direct vision,  positive ETCO2 and breath sounds checked- equal and bilateral Secured at: 21 cm Tube secured with: Tape Dental Injury: Teeth and Oropharynx as per pre-operative assessment

## 2016-09-26 NOTE — Transfer of Care (Signed)
Immediate Anesthesia Transfer of Care Note  Patient: Barbara Valdez  Procedure(s) Performed: Procedure(s): HYSTERECTOMY ABDOMINAL W/ BILATERAL SALPINGECTOMY (Bilateral)  Patient Location: PACU  Anesthesia Type:General  Level of Consciousness: awake, alert  and oriented  Airway & Oxygen Therapy: Patient Spontanous Breathing and Patient connected to nasal cannula oxygen  Post-op Assessment: Report given to RN and Post -op Vital signs reviewed and stable  Post vital signs: Reviewed and stable  Last Vitals:  Vitals:   09/26/16 0616 09/26/16 0620  BP:  97/72  Pulse: 82   Resp: 16   Temp: 36.9 C   SpO2: 100%     Last Pain:  Vitals:   09/26/16 0616  TempSrc: Oral      Patients Stated Pain Goal: 3 (29/93/71 6967)  Complications: No apparent anesthesia complications

## 2016-09-26 NOTE — Op Note (Signed)
09/26/2016  8:49 AM  PATIENT:  Barbara Valdez  41 y.o. female  PRE-OPERATIVE DIAGNOSIS:  MENORRHAGIA SUBMUCOSAL FIBROID  POST-OPERATIVE DIAGNOSIS:  MENORRHAGIA SUBMUCOSAL FIBROID  PROCEDURE:  Procedure(s): HYSTERECTOMY ABDOMINAL W/ BILATERAL SALPINGECTOMY (Bilateral)  SURGEON:  Surgeon(s) and Role:    * Hulan Fray, Wilhemina Cash, MD - Primary    * Chancy Milroy, MD - Assisting   ASSISTANTS: Reather Converse, MS3   ANESTHESIA:   local and general  EBL:  Total I/O In: 1000 [I.V.:1000] Out: 300 [Urine:100; Blood:200]  BLOOD ADMINISTERED:none  DRAINS: none   LOCAL MEDICATIONS USED:  MARCAINE     SPECIMEN:  Source of Specimen:  uterus and tubes  DISPOSITION OF SPECIMEN:  PATHOLOGY  COUNTS:  YES  TOURNIQUET:  * No tourniquets in log *  DICTATION: .Dragon Dictation  PLAN OF CARE: Admit to inpatient   PATIENT DISPOSITION:  PACU - hemodynamically stable.   Delay start of Pharmacological VTE agent (>24hrs) due to surgical blood loss or risk of bleeding: not applicable     The risks, benefits, and alternatives of surgery were explained, understood, and accepted. Consents were signed. All questions were answered. She was taken to the operating room and general anesthesia was applied without complication. Her abdomen and vagina were prepped and draped in the usual sterile fashion. A Foley catheter was placed which drained clear urine throughout the case. A transverse incision was made approximately 2 cm above her symphysis pubis after injecting 30 mL of 0.5% marcaine in the subcutaneous tissue. The incision was carried down through the subcutaneous tissue to the fascia. Bleeding encountered was cauterized with the Bovie. The fascia was scored the midline and the fascial incision was extended bilaterally. The pyramidalis muscles were separated in a transverse fashion using electrosurgical technique. Approximately 2 cm of the rectus muscles were separated in a transverse fashion in the midline  using electrosurgical technique. Hemostasis was maintained. The peritoneum was entered with hemostats and the peritoneal incision was extended bilaterally with the Bovie, taking care to avoid bowel and bladder. The patient was placed in Trendelenburg position and her bowel was packed out of the abdominal cavity. The pelvis was inspected.  Coker clamps were used to elevate the uterus. The round ligaments were identified clamped cut and ligated. A bladder flap was created anteriorly and the bladder was pushed out of the operative site with a moist lap sponge. The uteroovarian ligaments were identified bilaterally. They were clamped, cut, and ligated. Excellent hemostasis was noted. 2-0 Vicryl sutures used throughout this case unless otherwise specified. The uterine vessels were skeletonized, clamped, cut, and doubly ligated.  The remainder of the cervix was separated from its pelvic attachments using the same clamp, cut, ligate technique. Curved Heaney clamps were used to clamp beneath the cervix. The cervix and uterus were removed and sent to pathology. The vaginal cuff was noted to be hemostatic after placing running locking suture across the cuff. I elevated each oviduct with Kelly clamps and then removed the oviducts. The pedicles were hemostatic.  The pelvis was irrigated with 1 L of warm normal saline. All pedicles were noted to be hemostatic. The ureters were noted to be functioning and of normal caliber. The sponges were removed from the pelvis. The rectus muscles were inspected and hemostasis was assured. The fascia was closed with a 0 Vicryl running nonlocking suture. The subcutaneous tissue was irrigated, clean, dry.  A subcuticular closure was done with 3-0 vicryl suture. She tolerated the procedure well and was taken to the  recovery room in stable condition. Her Foley catheter drained clear urine throughout.

## 2016-09-26 NOTE — Discharge Instructions (Signed)
Abdominal Hysterectomy, Care After °This sheet gives you information about how to care for yourself after your procedure. Your doctor may also give you more specific instructions. If you have problems or questions, contact your doctor. °Follow these instructions at home: °Bathing °· Do not take baths, swim, or use a hot tub until your doctor says it is okay. Ask your doctor if you can take showers. You may only be allowed to take sponge baths for bathing. °· Keep the bandage (dressing) dry until your doctor says it can be taken off. °Surgical cut ( °incision) care °· Follow instructions from your doctor about how to take care of your cut from surgery. Make sure you: °? Wash your hands with soap and water before you change your bandage (dressing). If you cannot use soap and water, use hand sanitizer. °? Change your bandage as told by your doctor. °? Leave stitches (sutures), skin glue, or skin tape (adhesive) strips in place. They may need to stay in place for 2 weeks or longer. If tape strips get loose and curl up, you may trim the loose edges. Do not remove tape strips completely unless your doctor says it is okay. °· Check your surgical cut area every day for signs of infection. Check for: °? Redness, swelling, or pain. °? Fluid or blood. °? Warmth. °? Pus or a bad smell. °Activity °· Do gentle, daily exercise as told by your doctor. You may be told to take short walks every day and go farther each time. °· Do not lift anything that is heavier than 10 lb (4.5 kg), or the limit that your doctor tells you, until he or she says that it is safe. °· Do not drive or use heavy machinery while taking prescription pain medicine. °· Do not drive for 24 hours if you were given a medicine to help you relax (sedative). °· Follow your doctor's advice about exercise, driving, and general activities. Ask your doctor what activities are safe for you. °Lifestyle °· Do not douche, use tampons, or have sex for at least 6 weeks or as  told by your doctor. °· Do not drink alcohol until your doctor says it is okay. °· Drink enough fluid to keep your pee (urine) clear or pale yellow. °· Try to have someone at home with you for the first 1-2 weeks to help. °· Do not use any products that contain nicotine or tobacco, such as cigarettes and e-cigarettes. These can slow down healing. If you need help quitting, ask your doctor. °General instructions °· Take over-the-counter and prescription medicines only as told by your doctor. °· Do not take aspirin or ibuprofen. These medicines can cause bleeding. °· To prevent or treat constipation while you are taking prescription pain medicine, your doctor may suggest that you: °? Drink enough fluid to keep your urine clear or pale yellow. °? Take over-the-counter or prescription medicines. °? Eat foods that are high in fiber, such as: °§ Fresh fruits and vegetables. °§ Whole grains. °§ Beans. °? Limit foods that are high in fat and processed sugars, such as fried and sweet foods. °· Keep all follow-up visits as told by your doctor. This is important. °Contact a doctor if: °· You have chills or fever. °· You have redness, swelling, or pain around your cut. °· You have fluid or blood coming from your cut. °· Your cut feels warm to the touch. °· You have pus or a bad smell coming from your cut. °· Your cut breaks   open. °· You feel dizzy or light-headed. °· You have pain or bleeding when you pee. °· You keep having watery poop (diarrhea). °· You keep feeling sick to your stomach (nauseous) or keep throwing up (vomiting). °· You have unusual fluid (discharge) coming from your vagina. °· You have a rash. °· You have a reaction to your medicine. °· Your pain medicine does not help. °Get help right away if: °· You have a fever and your symptoms get worse all of a sudden. °· You have very bad belly (abdominal) pain. °· You are short of breath. °· You pass out (faint). °· You have pain, swelling, or redness of your  leg. °· You bleed a lot from your vagina and notice clumps of blood (clots). °Summary °· Do not take baths, swim, or use a hot tub until your doctor says it is okay. Ask your doctor if you can take showers. You may only be allowed to take sponge baths for bathing. °· Follow your doctor's advice about exercise, driving, and general activities. Ask your doctor what activities are safe for you. °· Do not lift anything that is heavier than 10 lb (4.5 kg), or the limit that your doctor tells you, until he or she says that it is safe. °· Try to have someone at home with you for the first 1-2 weeks to help. °This information is not intended to replace advice given to you by your health care provider. Make sure you discuss any questions you have with your health care provider. °Document Released: 10/19/2007 Document Revised: 12/29/2015 Document Reviewed: 12/29/2015 °Elsevier Interactive Patient Education © 2017 [employer] ° °

## 2016-09-27 ENCOUNTER — Encounter (HOSPITAL_COMMUNITY): Payer: Self-pay | Admitting: Obstetrics & Gynecology

## 2016-09-27 LAB — CBC
HCT: 29 % — ABNORMAL LOW (ref 36.0–46.0)
HEMOGLOBIN: 9.1 g/dL — AB (ref 12.0–15.0)
MCH: 24.8 pg — ABNORMAL LOW (ref 26.0–34.0)
MCHC: 31.4 g/dL (ref 30.0–36.0)
MCV: 79 fL (ref 78.0–100.0)
PLATELETS: 211 10*3/uL (ref 150–400)
RBC: 3.67 MIL/uL — ABNORMAL LOW (ref 3.87–5.11)
RDW: 17.4 % — ABNORMAL HIGH (ref 11.5–15.5)
WBC: 8.5 10*3/uL (ref 4.0–10.5)

## 2016-09-27 MED ORDER — IBUPROFEN 600 MG PO TABS: 600.0000 mg | ORAL_TABLET | Freq: Four times a day (QID) | ORAL | 1 refills | Status: DC | PRN

## 2016-09-27 MED ORDER — POLYETHYLENE GLYCOL 3350 17 G PO PACK: 17.0000 g | PACK | Freq: Every day | ORAL | 0 refills | Status: DC

## 2016-09-27 MED ORDER — SIMETHICONE 80 MG PO CHEW
80.0000 mg | CHEWABLE_TABLET | Freq: Four times a day (QID) | ORAL | Status: DC | PRN
  Administered 2016-09-27: 80 mg via ORAL
  Filled 2016-09-27: qty 1

## 2016-09-27 MED ORDER — OXYCODONE-ACETAMINOPHEN 5-325 MG PO TABS: 1.0000 | ORAL_TABLET | Freq: Four times a day (QID) | ORAL | 0 refills | Status: DC | PRN

## 2016-09-27 NOTE — Progress Notes (Signed)
1 Day Post-Op Procedure(s) (LRB): HYSTERECTOMY ABDOMINAL W/ BILATERAL SALPINGECTOMY (Bilateral)  Subjective: Patient reports tolerating PO liquid but has no appetite yet. Mentioned chronic constipation at home. Ambulating.  Objective: I have reviewed patient's vital signs, intake and output, medications and labs.  General: alert Resp: clear to auscultation bilaterally Cardio: regular rate and rhythm, S1, S2 normal, no murmur, click, rub or gallop GI: soft, non-tender; bowel sounds normal; no masses,  no organomegaly  Honeycomb: dry and intact  Assessment: s/p Procedure(s): HYSTERECTOMY ABDOMINAL W/ BILATERAL SALPINGECTOMY (Bilateral): stable  Plan: Encourage ambulation Discharge home this evening if she desires and tomorrow if not.  LOS: 1 day    [place] 09/27/2016, 9:06 AM

## 2016-09-28 NOTE — Progress Notes (Signed)
Pt discharged with printed instructions. Pt verbalized an understanding. No concerns noted. Calianne Larue L Ivelise Castillo, RN 

## 2016-09-28 NOTE — Discharge Summary (Signed)
Physician Discharge Summary  Patient ID: Barbara Valdez MRN: [id number] DOB/AGE: [birth year] 41 y.o.  Admit date: 09/26/2016 Discharge date: 09/28/2016  Admission Diagnoses: symptomatic fiboids  Discharge Diagnoses: same Active Problems:   Post-operative state   Discharged Condition: good  Hospital Course: She underwent an uncomplicated TAH/BS. By POD #1 she was voiding, tolerating po, and ambulating. By POD #2 she was having flatus and voiced her readiness to go home.  Consults: None  Significant Diagnostic Studies: labs: post op hbg 9.1  Treatments: surgery: as above  Discharge Exam: Blood pressure 95/62, pulse 72, temperature 98.8 F (37.1 C), temperature source Oral, resp. rate 18, height 5' 6"  (1.676 m), weight 73.6 kg (162 lb 3.2 oz), last menstrual period 09/06/2016, SpO2 99 %. General appearance: alert Resp: clear to auscultation bilaterally Cardio: regular rate and rhythm, S1, S2 normal, no murmur, click, rub or gallop GI: soft, non-tender; bowel sounds normal; no masses,  no organomegaly Incision/Wound:  Disposition: 01-Home or Self Care    Follow-up Information    Emily Filbert, MD. Schedule an appointment as soon as possible for a visit in 6 week(s).   Specialty:  Obstetrics and Gynecology Contact information: [place] [address] [phone]           Signed: Emily Filbert 09/28/2016, 12:00 PM

## 2016-09-29 NOTE — Anesthesia Postprocedure Evaluation (Signed)
Anesthesia Post Note  Patient: Firefighter  Procedure(s) Performed: Procedure(s) (LRB): HYSTERECTOMY ABDOMINAL W/ BILATERAL SALPINGECTOMY (Bilateral)     Patient location during evaluation: PACU Anesthesia Type: General Level of consciousness: awake and alert Pain management: pain level controlled Vital Signs Assessment: post-procedure vital signs reviewed and stable Respiratory status: spontaneous breathing, nonlabored ventilation, respiratory function stable and patient connected to nasal cannula oxygen Cardiovascular status: blood pressure returned to baseline and stable Postop Assessment: no signs of nausea or vomiting Anesthetic complications: no    Last Vitals:  Vitals:   09/28/16 0800 09/28/16 1300  BP: 95/62 110/72  Pulse: 72 82  Resp: 18 16  Temp: 37.1 C 36.9 C  SpO2: 99%     Last Pain:  Vitals:   09/28/16 1300  TempSrc: Oral  PainSc: 6                  Chiquetta Langner EDWARD

## 2016-10-19 ENCOUNTER — Other Ambulatory Visit (HOSPITAL_COMMUNITY): Payer: Self-pay | Admitting: Obstetrics & Gynecology

## 2016-10-19 DIAGNOSIS — Z1231 Encounter for screening mammogram for malignant neoplasm of breast: Secondary | ICD-10-CM

## 2016-11-07 ENCOUNTER — Ambulatory Visit
Admission: RE | Admit: 2016-11-07 | Discharge: 2016-11-07 | Disposition: A | Discharge status: Home / Self Care | Payer: No Typology Code available for payment source | Source: Ambulatory Visit | Attending: Obstetrics & Gynecology | Admitting: Obstetrics & Gynecology

## 2016-11-07 DIAGNOSIS — Z1231 Encounter for screening mammogram for malignant neoplasm of breast: Secondary | ICD-10-CM

## 2016-11-08 ENCOUNTER — Encounter: Payer: Self-pay | Admitting: Obstetrics & Gynecology

## 2016-11-08 ENCOUNTER — Ambulatory Visit (INDEPENDENT_AMBULATORY_CARE_PROVIDER_SITE_OTHER): Payer: Self-pay | Admitting: Obstetrics & Gynecology

## 2016-11-08 VITALS — BP 102/74 | HR 75 | Wt 159.4 lb

## 2016-11-08 DIAGNOSIS — Z0000 Encounter for general adult medical examination without abnormal findings: Secondary | ICD-10-CM

## 2016-11-08 DIAGNOSIS — Z9889 Other specified postprocedural states: Secondary | ICD-10-CM

## 2016-11-08 DIAGNOSIS — R232 Flushing: Secondary | ICD-10-CM

## 2016-11-08 MED ORDER — METRONIDAZOLE 500 MG PO TABS: 500.0000 mg | ORAL_TABLET | Freq: Two times a day (BID) | ORAL | 0 refills | Status: DC

## 2016-11-08 NOTE — Progress Notes (Signed)
   Subjective:    Patient ID: Barbara Valdez, female    DOB: [birth year], 41 y.o.   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 She has not had sex yet. She has not been to work for 5 months and is now ready to go back and has turned in her paperwork for this.  She reports normal bowel and bladder function.  Her only issue is that she has been having hot flashes and can't sleep.   Review of Systems Mammogram done yesterday and it was normal.    Objective:   Physical Exam Breathing, conversing, and ambulating normally Abd- benign Incision- healed well Cuff- healed well Vaginal discharge c/w BV Bimanual exam normal       Assessment & Plan:  Post op- doing well Hot flashes- check FSH Insomnia- rec melatonin Preventative care- flu vaccine today BV- flagyl BID

## 2016-11-09 LAB — FOLLICLE STIMULATING HORMONE: FSH: 26.9 m[IU]/mL

## 2016-11-14 ENCOUNTER — Telehealth: Payer: Self-pay | Admitting: General Practice

## 2016-11-14 NOTE — Telephone Encounter (Signed)
Patient called and left message stating she is a patient of Dr Alease Medina and had sex for the first time since her hysterectomy and is bleeding some and wants to know if that is something she should be concerned about. Called patient, no answer- left message stating we are trying to reach you to return your phone call & wanted to see how you are doing, please call us back

## 2016-11-15 NOTE — Telephone Encounter (Signed)
Patient called and left message stating she has been having spotting post hysterectomy and wants to know if that's okay. Called patient, no answer- left message stating we are trying to reach you to return your phone call, please call us back

## 2016-11-16 NOTE — Telephone Encounter (Signed)
Patient called and left message stating she is returning our phone call & the best time to reach her is after 3. Called patient and she states she had light bleeding after intercourse that turned to spotting and only last 2 days. Patient reports no pain or bleeding currently. Told patient it was likely due to her recent exam then the friction from intercourse. Told patient if it happens again or she has pain to call back for an appt. Patient verbalized understanding & had no questions

## 2017-12-17 ENCOUNTER — Other Ambulatory Visit: Payer: Self-pay | Admitting: Obstetrics & Gynecology

## 2017-12-17 ENCOUNTER — Other Ambulatory Visit (HOSPITAL_COMMUNITY): Payer: Self-pay | Admitting: Obstetrics & Gynecology

## 2017-12-17 DIAGNOSIS — Z1231 Encounter for screening mammogram for malignant neoplasm of breast: Secondary | ICD-10-CM

## 2018-03-25 ENCOUNTER — Emergency Department (HOSPITAL_COMMUNITY)
Admission: EM | Admit: 2018-03-25 | Discharge: 2018-03-25 | Disposition: A | Discharge status: Home / Self Care | Payer: Self-pay | Attending: Emergency Medicine | Admitting: Emergency Medicine

## 2018-03-25 ENCOUNTER — Emergency Department (HOSPITAL_COMMUNITY): Payer: Self-pay

## 2018-03-25 ENCOUNTER — Encounter (HOSPITAL_COMMUNITY): Payer: Self-pay | Admitting: Emergency Medicine

## 2018-03-25 ENCOUNTER — Other Ambulatory Visit: Payer: Self-pay

## 2018-03-25 DIAGNOSIS — Z79899 Other long term (current) drug therapy: Secondary | ICD-10-CM | POA: Insufficient documentation

## 2018-03-25 DIAGNOSIS — J40 Bronchitis, not specified as acute or chronic: Secondary | ICD-10-CM | POA: Insufficient documentation

## 2018-03-25 MED ORDER — AZITHROMYCIN 250 MG PO TABS: 250.0000 mg | ORAL_TABLET | Freq: Every day | ORAL | 0 refills | Status: DC

## 2018-03-25 NOTE — Discharge Instructions (Addendum)
Stay hydrated.   Take robitussin for cough  Take tylenol, motrin for fever   See your doctor.   Return to work on Wednesday.   You may start zpack in 2 days if you still have persistent cough.   Return to ER if you have worse trouble breathing, fever, vomiting.

## 2018-03-25 NOTE — ED Provider Notes (Signed)
[facility] DEPT Provider Note   CSN: [id number] Arrival date & time: 03/25/18  1704    History   Chief Complaint Chief Complaint  Patient presents with  . Cough    HPI Barbara Valdez is a 43 y.o. female history of anemia who presented with productive cough.  Patient states that she has been having productive cough for the last 5 to 6 days.  Initially was nonproductive and got better but over the last 2 to 3 days, she had productive cough with some green sputum.  Had some subjective chills but no documented fever at home.  She states that 1 of her colleagues was sick with cough as well but denies any exposure to flu or recent travel     The history is provided by the patient.    Past Medical History:  Diagnosis Date  . Anemia   . Anxiety   . Complication of anesthesia    took a long time to wake up after surgery    Patient Active Problem List   Diagnosis Date Noted  . Post-operative state 09/26/2016  . 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 Menorrhagia with regular cycle 07/18/2016  . 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 Anemia 07/18/2016    Past Surgical History:  Procedure Laterality Date  . ABDOMINAL HYSTERECTOMY Bilateral 09/26/2016   Procedure: HYSTERECTOMY ABDOMINAL W/ BILATERAL SALPINGECTOMY;  Surgeon: Emily Filbert, MD;  Location: [facility];  Service: Gynecology;  Laterality: Bilateral;  . BREAST BIOPSY Left   . TUBAL LIGATION       OB History    Gravida  2   Para  2   Term  2   Preterm      AB      Living  2     SAB      TAB      Ectopic      Multiple      Live Births  2            Home Medications    Prior to Admission medications   Medication Sig Start Date End Date Taking? Authorizing Provider  ibuprofen (ADVIL,MOTRIN) 600 MG tablet Take 1 tablet (600 mg total) by mouth every 6 (six) hours as needed. Patient not taking: Reported on 11/08/2016 09/27/16   Emily Filbert, MD  metroNIDAZOLE (FLAGYL) 500 MG  tablet Take 1 tablet (500 mg total) by mouth 2 (two) times daily. 11/08/16   Emily Filbert, MD  Multiple Vitamins-Minerals (ALIVE WOMENS GUMMY) CHEW Chew 1 tablet by mouth 2 (two) times daily.    [provider]    Family History Family History  Problem Relation Age of Onset  . Hyperlipidemia Father   . Hypertension Other   . Breast cancer Neg Hx     Social History Social History   Tobacco Use  . Smoking status: Never Smoker  . Smokeless tobacco: Never Used  Substance Use Topics  . Alcohol use: No  . Drug use: No     Allergies   Patient has no known allergies.   Review of Systems Review of Systems  Respiratory: Positive for cough.   All other systems reviewed and are negative.    Physical Exam Updated Vital Signs BP 106/75 (BP Location: Right Arm)   Pulse 79   Temp 98.5 F (36.9 C) (Oral)   Resp 16   LMP 10/23/2016   SpO2 100%   Physical Exam Vitals signs and nursing note reviewed.  Constitutional:  Appearance: Normal appearance.  HENT:     Head: Normocephalic.     Nose: Nose normal.     Mouth/Throat:     Mouth: Mucous membranes are moist.  Eyes:     Extraocular Movements: Extraocular movements intact.     Pupils: Pupils are equal, round, and reactive to light.  Neck:     Musculoskeletal: Normal range of motion.  Cardiovascular:     Rate and Rhythm: Normal rate and regular rhythm.  Pulmonary:     Effort: Pulmonary effort is normal.     Comments: Minimal wheezing, no crackles  Abdominal:     General: Abdomen is flat.     Palpations: Abdomen is soft.  Musculoskeletal: Normal range of motion.  Skin:    General: Skin is warm.     Capillary Refill: Capillary refill takes less than 2 seconds.  Neurological:     General: No focal deficit present.     Mental Status: She is alert and oriented to person, place, and time.  Psychiatric:        Mood and Affect: Mood normal.        Behavior: Behavior normal.      ED Treatments / Results    Labs (all labs ordered are listed, but only abnormal results are displayed) Labs Reviewed - No data to display  EKG None  Radiology No results found.  Procedures Procedures (including critical care time)  Medications Ordered in ED Medications - No data to display   Initial Impression / Assessment and Plan / ED Course  I have reviewed the triage vital signs and the nursing notes.  Pertinent labs & imaging results that were available during my care of the patient were reviewed by me and considered in my medical decision making (see chart for details).       Barbara Valdez is a 43 y.o. female here with cough. Afebrile, well appearing. Not hypoxic. Likely bronchitis vs atypical pneumonia. Offered CXR but she refused. Told her to use robitussin as needed. She can start zpack if she has persistent productive cough     Final Clinical Impressions(s) / ED Diagnoses   Final diagnoses:  None    ED Discharge Orders    None       Drenda Freeze, MD 03/25/18 2041

## 2018-03-25 NOTE — ED Triage Notes (Signed)
Pt c/o cough that is productive with green phlegm since last week.

## 2018-08-23 ENCOUNTER — Other Ambulatory Visit: Payer: Self-pay

## 2018-08-23 DIAGNOSIS — Z20822 Contact with and (suspected) exposure to covid-19: Secondary | ICD-10-CM

## 2018-08-25 LAB — NOVEL CORONAVIRUS, NAA: SARS-CoV-2, NAA: NOT DETECTED

## 2018-09-20 ENCOUNTER — Other Ambulatory Visit: Payer: Self-pay

## 2018-09-20 DIAGNOSIS — Z20822 Contact with and (suspected) exposure to covid-19: Secondary | ICD-10-CM

## 2018-09-22 LAB — NOVEL CORONAVIRUS, NAA: SARS-CoV-2, NAA: NOT DETECTED

## 2018-10-17 ENCOUNTER — Other Ambulatory Visit: Payer: Self-pay

## 2018-10-17 DIAGNOSIS — Z20822 Contact with and (suspected) exposure to covid-19: Secondary | ICD-10-CM

## 2018-10-18 LAB — NOVEL CORONAVIRUS, NAA: SARS-CoV-2, NAA: NOT DETECTED

## 2019-05-01 ENCOUNTER — Ambulatory Visit: Payer: Self-pay | Attending: Internal Medicine

## 2019-05-01 DIAGNOSIS — Z23 Encounter for immunization: Secondary | ICD-10-CM

## 2019-05-01 NOTE — Progress Notes (Signed)
   Covid-19 Vaccination Clinic  Name:  Barbara Valdez    MRN: [id number] DOB: 07/06/1975  05/01/2019  [name] was observed post Covid-19 immunization for 15 minutes without incident. She was provided with Vaccine Information Sheet and instruction to access the V-Safe system.   [name] was instructed to call 911 with any severe reactions post vaccine: Marland Kitchen Difficulty breathing  . Swelling of face and throat  . A fast heartbeat  . A bad rash all over body  . Dizziness and weakness   Immunizations Administered    Name Date Dose VIS Date Route   Pfizer COVID-19 Vaccine 05/01/2019 10:10 AM 0.3 mL 01/03/2019 Intramuscular   Manufacturer: [employer], [employer]   Lot: [id number]   [place]: [id number]

## 2019-05-28 ENCOUNTER — Ambulatory Visit: Payer: Self-pay | Attending: Internal Medicine

## 2020-05-05 ENCOUNTER — Emergency Department (HOSPITAL_BASED_OUTPATIENT_CLINIC_OR_DEPARTMENT_OTHER)
Admission: EM | Admit: 2020-05-05 | Discharge: 2020-05-05 | Disposition: A | Discharge status: Home / Self Care | Payer: Self-pay | Attending: Emergency Medicine | Admitting: Emergency Medicine

## 2020-05-05 ENCOUNTER — Other Ambulatory Visit: Payer: Self-pay

## 2020-05-05 ENCOUNTER — Encounter (HOSPITAL_BASED_OUTPATIENT_CLINIC_OR_DEPARTMENT_OTHER): Payer: Self-pay

## 2020-05-05 DIAGNOSIS — A084 Viral intestinal infection, unspecified: Secondary | ICD-10-CM | POA: Insufficient documentation

## 2020-05-05 LAB — BASIC METABOLIC PANEL
Anion gap: 12 (ref 5–15)
BUN: 17 mg/dL (ref 6–20)
CO2: 22 mmol/L (ref 22–32)
Calcium: 9.6 mg/dL (ref 8.9–10.3)
Chloride: 104 mmol/L (ref 98–111)
Creatinine, Ser: 0.87 mg/dL (ref 0.44–1.00)
GFR, Estimated: >60 mL/min (ref >60)
Glucose, Bld: 161 mg/dL — ABNORMAL HIGH (ref 70–99)
Potassium: 4.6 mmol/L (ref 3.5–5.1)
Sodium: 138 mmol/L (ref 135–145)

## 2020-05-05 LAB — CBC WITH DIFFERENTIAL/PLATELET
Abs Immature Granulocytes: 0.04 10*3/uL (ref 0.00–0.07)
Basophils Absolute: 0 10*3/uL (ref 0.0–0.1)
Basophils Relative: 0 %
Eosinophils Absolute: 0 10*3/uL (ref 0.0–0.5)
Eosinophils Relative: 0 %
HCT: 39.9 % (ref 36.0–46.0)
Hemoglobin: 12.5 g/dL (ref 12.0–15.0)
Immature Granulocytes: 0 %
Lymphocytes Relative: 5 %
Lymphs Abs: 0.7 10*3/uL (ref 0.7–4.0)
MCH: 24.9 pg — ABNORMAL LOW (ref 26.0–34.0)
MCHC: 31.3 g/dL (ref 30.0–36.0)
MCV: 79.5 fL — ABNORMAL LOW (ref 80.0–100.0)
Monocytes Absolute: 0.5 10*3/uL (ref 0.1–1.0)
Monocytes Relative: 4 %
Neutro Abs: 11.5 10*3/uL — ABNORMAL HIGH (ref 1.7–7.7)
Neutrophils Relative %: 91 %
Platelets: 437 10*3/uL — ABNORMAL HIGH (ref 150–400)
RBC: 5.02 MIL/uL (ref 3.87–5.11)
RDW: 14.3 % (ref 11.5–15.5)
WBC: 12.8 10*3/uL — ABNORMAL HIGH (ref 4.0–10.5)
nRBC: 0 % (ref 0.0–0.2)

## 2020-05-05 LAB — URINALYSIS, ROUTINE W REFLEX MICROSCOPIC
Bilirubin Urine: NEGATIVE
Glucose, UA: NEGATIVE mg/dL
Hgb urine dipstick: NEGATIVE
Ketones, ur: 15 mg/dL — AB
Leukocytes,Ua: NEGATIVE
Nitrite: NEGATIVE
Protein, ur: NEGATIVE mg/dL
Specific Gravity, Urine: 1.01 (ref 1.005–1.030)
pH: 5.5 (ref 5.0–8.0)

## 2020-05-05 MED ORDER — LOPERAMIDE HCL 2 MG PO CAPS
4.0000 mg | ORAL_CAPSULE | Freq: Once | ORAL | Status: AC
  Administered 2020-05-05: 4 mg via ORAL
  Filled 2020-05-05: qty 2

## 2020-05-05 MED ORDER — DICYCLOMINE HCL 20 MG PO TABS: 20.0000 mg | ORAL_TABLET | Freq: Four times a day (QID) | ORAL | 0 refills | Status: DC | PRN

## 2020-05-05 MED ORDER — ONDANSETRON HCL 8 MG PO TABS: 8.0000 mg | ORAL_TABLET | ORAL | 0 refills | Status: DC | PRN

## 2020-05-05 MED ORDER — ONDANSETRON HCL 4 MG/2ML IJ SOLN
4.0000 mg | Freq: Once | INTRAMUSCULAR | Status: AC
  Administered 2020-05-05: 4 mg via INTRAVENOUS
  Filled 2020-05-05: qty 2

## 2020-05-05 MED ORDER — LACTATED RINGERS IV BOLUS
1000.0000 mL | Freq: Once | INTRAVENOUS | Status: AC
  Administered 2020-05-05: 1000 mL via INTRAVENOUS

## 2020-05-05 NOTE — ED Provider Notes (Signed)
[facility] Provider Note: Georgena Spurling, MD, FACEP  CSN: [id number] MRN: [id number] ARRIVAL: 05/05/20 at [place]: [id number]   CHIEF COMPLAINT  Vomiting   HISTORY OF PRESENT ILLNESS  05/05/20 1:08 AM  Barbara Valdez is a 45 y.o. female with nausea, vomiting and diarrhea since yesterday. She has had associated lower abdominal cramping which she rates as a 10 out of 10. It primarily precedes bowel movements, and voiding her bowels relieves the pain. Her stools are dark and watery. Eating or drinking triggers vomiting and she has kept little on her stomach despite trying to drink Gatorade, etc. Pepto-Bismol did not help.    Past Medical History:  Diagnosis Date  . Anemia   . Anxiety   . Complication of anesthesia    took a long time to wake up after surgery    Past Surgical History:  Procedure Laterality Date  . ABDOMINAL HYSTERECTOMY Bilateral 09/26/2016   Procedure: HYSTERECTOMY ABDOMINAL W/ BILATERAL SALPINGECTOMY;  Surgeon: Emily Filbert, MD;  Location: [facility];  Service: Gynecology;  Laterality: Bilateral;  . BREAST BIOPSY Left   . TUBAL LIGATION      Family History  Problem Relation Age of Onset  . Hyperlipidemia Father   . Hypertension Other   . Breast cancer Neg Hx     Social History   Tobacco Use  . Smoking status: Never Smoker  . Smokeless tobacco: Never Used  Vaping Use  . Vaping Use: Never used  Substance Use Topics  . Alcohol use: No  . Drug use: No    Prior to Admission medications   Medication Sig Start Date End Date Taking? Authorizing Provider  dicyclomine (BENTYL) 20 MG tablet Take 1 tablet (20 mg total) by mouth 4 (four) times daily as needed (Abdominal cramping). 05/05/20  Yes Linus Weckerly, Jenny Reichmann, MD  ondansetron (ZOFRAN) 8 MG tablet Take 1 tablet (8 mg total) by mouth every 4 (four) hours as needed for nausea or vomiting. 05/05/20  Yes Gioia Ranes, MD  Multiple Vitamins-Minerals (ALIVE WOMENS GUMMY) CHEW Chew 1 tablet by mouth 2 (two) times  daily.    [provider]    Allergies Patient has no known allergies.   REVIEW OF SYSTEMS  Negative except as noted here or in the History of Present Illness.   PHYSICAL EXAMINATION  Initial Vital Signs Blood pressure 131/90, pulse (!) 115, temperature 98.1 F (36.7 C), temperature source Oral, resp. rate 18, height 5' 6"  (1.676 m), weight 73.9 kg, last menstrual period 10/23/2016, SpO2 99 %.  Examination General: Well-developed, well-nourished female in no acute distress; appearance consistent with age of record HENT: normocephalic; atraumatic Eyes: pupils equal, round and reactive to light; extraocular muscles intact Neck: supple Heart: regular rate and rhythm Lungs: clear to auscultation bilaterally Abdomen: soft; nondistended; nontender; no masses or hepatosplenomegaly; bowel sounds present Extremities: No deformity; full range of motion; pulses normal Neurologic: Awake, alert and oriented; motor function intact in all extremities and symmetric; no facial droop Skin: Warm and dry Psychiatric: Normal mood and affect   RESULTS  Summary of this visit's results, reviewed and interpreted by myself:   EKG Interpretation  Date/Time:    Ventricular Rate:    PR Interval:    QRS Duration:   QT Interval:    QTC Calculation:   R Axis:     Text Interpretation:        Laboratory Studies: Results for orders placed or performed during the hospital encounter of 05/05/20 (from the past 24 hour(s))  CBC with Differential/Platelet     Status: Abnormal   Collection Time: 05/05/20  1:19 AM  Result Value Ref Range   WBC 12.8 (H) 4.0 - 10.5 K/uL   RBC 5.02 3.87 - 5.11 MIL/uL   Hemoglobin 12.5 12.0 - 15.0 g/dL   HCT 39.9 36.0 - 46.0 %   MCV 79.5 (L) 80.0 - 100.0 fL   MCH 24.9 (L) 26.0 - 34.0 pg   MCHC 31.3 30.0 - 36.0 g/dL   RDW 14.3 11.5 - 15.5 %   Platelets 437 (H) 150 - 400 K/uL   nRBC 0.0 0.0 - 0.2 %   Neutrophils Relative % 91 %   Neutro Abs 11.5 (H) 1.7 - 7.7  K/uL   Lymphocytes Relative 5 %   Lymphs Abs 0.7 0.7 - 4.0 K/uL   Monocytes Relative 4 %   Monocytes Absolute 0.5 0.1 - 1.0 K/uL   Eosinophils Relative 0 %   Eosinophils Absolute 0.0 0.0 - 0.5 K/uL   Basophils Relative 0 %   Basophils Absolute 0.0 0.0 - 0.1 K/uL   Immature Granulocytes 0 %   Abs Immature Granulocytes 0.04 0.00 - 0.07 K/uL  Basic metabolic panel     Status: Abnormal   Collection Time: 05/05/20  1:19 AM  Result Value Ref Range   Sodium 138 135 - 145 mmol/L   Potassium 4.6 3.5 - 5.1 mmol/L   Chloride 104 98 - 111 mmol/L   CO2 22 22 - 32 mmol/L   Glucose, Bld 161 (H) 70 - 99 mg/dL   BUN 17 6 - 20 mg/dL   Creatinine, Ser 0.87 0.44 - 1.00 mg/dL   Calcium 9.6 8.9 - 10.3 mg/dL   GFR, Estimated >60 >60 mL/min   Anion gap 12 5 - 15  Urinalysis, Routine w reflex microscopic     Status: Abnormal   Collection Time: 05/05/20  2:28 AM  Result Value Ref Range   Color, Urine YELLOW YELLOW   APPearance CLEAR CLEAR   Specific Gravity, Urine 1.010 1.005 - 1.030   pH 5.5 5.0 - 8.0   Glucose, UA NEGATIVE NEGATIVE mg/dL   Hgb urine dipstick NEGATIVE NEGATIVE   Bilirubin Urine NEGATIVE NEGATIVE   Ketones, ur 15 (A) NEGATIVE mg/dL   Protein, ur NEGATIVE NEGATIVE mg/dL   Nitrite NEGATIVE NEGATIVE   Leukocytes,Ua NEGATIVE NEGATIVE   Imaging Studies: No results found.  ED COURSE and MDM  Nursing notes, initial and subsequent vitals signs, including pulse oximetry, reviewed and interpreted by myself.  Vitals:   05/05/20 0101 05/05/20 0104 05/05/20 0200  BP: 131/90  119/85  Pulse: (!) 115  95  Resp: 18  18  Temp: 98.1 F (36.7 C)    TempSrc: Oral    SpO2: 99%  93%  Weight:  73.9 kg   Height:  5' 6"  (1.676 m)    Medications  lactated ringers bolus 1,000 mL (0 mLs Intravenous Stopped 05/05/20 0235)  ondansetron (ZOFRAN) injection 4 mg (4 mg Intravenous Given 05/05/20 0116)  loperamide (IMODIUM) capsule 4 mg (4 mg Oral Given 05/05/20 0240)   2:42 AM Patient feeling better  after IV fluids and Zofran.  Will attempt oral fluid challenge.   3:09 AM No vomiting in the ED.  Patient still having diarrhea, was given dose of Imodium.  She was advised to take over-the-counter Imodium.  We will prescribe Zofran and Bentyl for nausea and cramping, respectively.  Presentation consistent with a viral gastroenteritis.   PROCEDURES  Procedures   ED  DIAGNOSES     ICD-10-CM   1. Viral gastroenteritis  A08.4        Shereen Marton, MD 05/05/20 [phone]

## 2020-05-05 NOTE — ED Triage Notes (Signed)
Pt arrived via POV for nausea, vomiting and diarrhea that started today.

## 2020-07-10 ENCOUNTER — Encounter (HOSPITAL_BASED_OUTPATIENT_CLINIC_OR_DEPARTMENT_OTHER): Payer: Self-pay | Admitting: *Deleted

## 2020-07-10 ENCOUNTER — Other Ambulatory Visit: Payer: Self-pay

## 2020-07-10 ENCOUNTER — Emergency Department (HOSPITAL_BASED_OUTPATIENT_CLINIC_OR_DEPARTMENT_OTHER)
Admission: EM | Admit: 2020-07-10 | Discharge: 2020-07-10 | Disposition: A | Discharge status: Home / Self Care | Payer: Self-pay | Attending: Emergency Medicine | Admitting: Emergency Medicine

## 2020-07-10 DIAGNOSIS — K922 Gastrointestinal hemorrhage, unspecified: Secondary | ICD-10-CM

## 2020-07-10 DIAGNOSIS — D649 Anemia, unspecified: Secondary | ICD-10-CM

## 2020-07-10 LAB — COMPREHENSIVE METABOLIC PANEL
ALT: 5 U/L (ref 0–44)
AST: 14 U/L — ABNORMAL LOW (ref 15–41)
Albumin: 4 g/dL (ref 3.5–5.0)
Alkaline Phosphatase: 45 U/L (ref 38–126)
Anion gap: 8 (ref 5–15)
BUN: 11 mg/dL (ref 6–20)
CO2: 25 mmol/L (ref 22–32)
Calcium: 9.3 mg/dL (ref 8.9–10.3)
Chloride: 108 mmol/L (ref 98–111)
Creatinine, Ser: 0.76 mg/dL (ref 0.44–1.00)
GFR, Estimated: >60 mL/min (ref >60)
Glucose, Bld: 96 mg/dL (ref 70–99)
Potassium: 3.7 mmol/L (ref 3.5–5.1)
Sodium: 141 mmol/L (ref 135–145)
Total Bilirubin: 0.3 mg/dL (ref 0.3–1.2)
Total Protein: 7.5 g/dL (ref 6.5–8.1)

## 2020-07-10 LAB — CBC
HCT: 32.6 % — ABNORMAL LOW (ref 36.0–46.0)
Hemoglobin: 9.7 g/dL — ABNORMAL LOW (ref 12.0–15.0)
MCH: 22.7 pg — ABNORMAL LOW (ref 26.0–34.0)
MCHC: 29.8 g/dL — ABNORMAL LOW (ref 30.0–36.0)
MCV: 76.2 fL — ABNORMAL LOW (ref 80.0–100.0)
Platelets: 375 10*3/uL (ref 150–400)
RBC: 4.28 MIL/uL (ref 3.87–5.11)
RDW: 15.1 % (ref 11.5–15.5)
WBC: 4.6 10*3/uL (ref 4.0–10.5)
nRBC: 0 % (ref 0.0–0.2)

## 2020-07-10 LAB — OCCULT BLOOD X 1 CARD TO LAB, STOOL: Fecal Occult Bld: POSITIVE — AB

## 2020-07-10 LAB — LIPASE, BLOOD: Lipase: 24 U/L (ref 11–51)

## 2020-07-10 MED ORDER — FERROUS SULFATE 325 (65 FE) MG PO TABS: 325.0000 mg | ORAL_TABLET | Freq: Every day | ORAL | 0 refills | Status: DC

## 2020-07-10 NOTE — Discharge Instructions (Addendum)
Start taking the iron tablets to help with your anemia.  This will continue to make your stool looked dark.  Follow-up with the GI doctor for further evaluation.  Return to the emergency room if you start having lightheadedness, weakness, vomiting of blood or other concerning symptoms

## 2020-07-10 NOTE — ED Triage Notes (Signed)
She tells me she was seen here in April of this year for ?gastroenteritis? She is here today with c/o persistent "soft, small dark" stools", plus an abnormal quantity of malodorous gas. She denies fever nor any abd. Pain and is in no distress.

## 2020-07-10 NOTE — ED Provider Notes (Signed)
[facility] EMERGENCY DEPT Provider Note   CSN: [id number] Arrival date & time: 07/10/20  1601     History Chief Complaint  Patient presents with  . Abdominal Pain    Barbara Valdez is a 45 y.o. female.   Abdominal Pain   Patient presents to the ED for evaluation of irregular bowel movements as well as malodorous gas.  Patient states she was seen in April and was diagnosed with gastroenteritis.  She feels like she has been having persistent symptoms since then.  Her stools are soft and dark.  She will have a few bowel movements per day.  She says she has a lot of gas and it is very malodorous.  She has not had any issues with nausea or vomiting.  She is not having any abdominal pain.  Her appetite is fine.  Patient denies any camping.  She denies any pets at home.  She thinks she has lost about 10 pounds. Patient has not seen anyone for this since it started. Past Medical History:  Diagnosis Date  . Anemia   . Anxiety   . Complication of anesthesia    took a long time to wake up after surgery    Patient Active Problem List   Diagnosis Date Noted  . Post-operative state 09/26/2016  . 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 Menorrhagia with regular cycle 07/18/2016  . 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 Anemia 07/18/2016    Past Surgical History:  Procedure Laterality Date  . ABDOMINAL HYSTERECTOMY Bilateral 09/26/2016   Procedure: HYSTERECTOMY ABDOMINAL W/ BILATERAL SALPINGECTOMY;  Surgeon: Emily Filbert, MD;  Location: [facility];  Service: Gynecology;  Laterality: Bilateral;  . BREAST BIOPSY Left   . TUBAL LIGATION       OB History     Gravida  2   Para  2   Term  2   Preterm      AB      Living  2      SAB      IAB      Ectopic      Multiple      Live Births  2           Family History  Problem Relation Age of Onset  . Hyperlipidemia Father   . Hypertension Other   . Breast cancer Neg Hx     Social History   Tobacco Use  .  Smoking status: Never  . Smokeless tobacco: Never  Vaping Use  . Vaping Use: Never used  Substance Use Topics  . Alcohol use: No  . Drug use: No    Home Medications Prior to Admission medications   Medication Sig Start Date End Date Taking? Authorizing Provider  ferrous sulfate 325 (65 FE) MG tablet Take 1 tablet (325 mg total) by mouth daily. 07/10/20  Yes Dorie Rank, MD  dicyclomine (BENTYL) 20 MG tablet Take 1 tablet (20 mg total) by mouth 4 (four) times daily as needed (Abdominal cramping). 05/05/20   Molpus, John, MD  Multiple Vitamins-Minerals (ALIVE WOMENS GUMMY) CHEW Chew 1 tablet by mouth 2 (two) times daily.    [provider]  ondansetron (ZOFRAN) 8 MG tablet Take 1 tablet (8 mg total) by mouth every 4 (four) hours as needed for nausea or vomiting. 05/05/20   Molpus, Jenny Reichmann, MD    Allergies    Patient has no known allergies.  Review of Systems   Review of Systems  Gastrointestinal:  Positive for abdominal  pain.  All other systems reviewed and are negative.  Physical Exam Updated Vital Signs BP 106/80 (BP Location: Right Arm)   Pulse 76   Temp 98.5 F (36.9 C) (Oral)   Resp 16   Ht 1.676 m (5' 6" )   Wt 70.3 kg   LMP 10/23/2016   SpO2 100%   BMI 25.02 kg/m   Physical Exam Vitals and nursing note reviewed.  Constitutional:      General: She is not in acute distress.    Appearance: She is well-developed.  HENT:     Head: Normocephalic and atraumatic.     Right Ear: External ear normal.     Left Ear: External ear normal.  Eyes:     General: No scleral icterus.       Right eye: No discharge.        Left eye: No discharge.     Conjunctiva/sclera: Conjunctivae normal.  Neck:     Trachea: No tracheal deviation.  Cardiovascular:     Rate and Rhythm: Normal rate and regular rhythm.  Pulmonary:     Effort: Pulmonary effort is normal. No respiratory distress.     Breath sounds: Normal breath sounds. No stridor. No wheezing or rales.  Abdominal:      General: Bowel sounds are normal. There is no distension.     Palpations: Abdomen is soft.     Tenderness: There is no abdominal tenderness. There is no guarding or rebound.  Musculoskeletal:        General: No tenderness or deformity.     Cervical back: Neck supple.  Skin:    General: Skin is warm and dry.     Findings: No rash.  Neurological:     General: No focal deficit present.     Mental Status: She is alert.     Cranial Nerves: No cranial nerve deficit (no facial droop, extraocular movements intact, no slurred speech).     Sensory: No sensory deficit.     Motor: No abnormal muscle tone or seizure activity.     Coordination: Coordination normal.  Psychiatric:        Mood and Affect: Mood normal.    ED Results / Procedures / Treatments   Labs (all labs ordered are listed, but only abnormal results are displayed) Labs Reviewed  COMPREHENSIVE METABOLIC PANEL - Abnormal; Notable for the following components:      Result Value   AST 14 (*)    All other components within normal limits  CBC - Abnormal; Notable for the following components:   Hemoglobin 9.7 (*)    HCT 32.6 (*)    MCV 76.2 (*)    MCH 22.7 (*)    MCHC 29.8 (*)    All other components within normal limits  OCCULT BLOOD X 1 CARD TO LAB, STOOL - Abnormal; Notable for the following components:   Fecal Occult Bld POSITIVE (*)    All other components within normal limits  LIPASE, BLOOD    EKG None  Radiology No results found.  Procedures Procedures   Medications Ordered in ED Medications - No data to display  ED Course  I have reviewed the triage vital signs and the nursing notes.  Pertinent labs & imaging results that were available during my care of the patient were reviewed by me and considered in my medical decision making (see chart for details).  Clinical Course as of 07/10/20 1035  Sat Jul 11, 3014  0109 Metabolic panel unremarkable [JK]  [phone]  Patient is anemic.  Decreased from a few months  ago [JK]  1017 Fecal occult is positive [JK]  1031 Discussed with Dr. Therisa Doyne [JK]    Clinical Course User Index [JK] Dorie Rank, MD   MDM Rules/Calculators/A&P                          Patient's labs show that she does have anemia.  She also has guaiac positive stools.  It appears patient has been having some occult GI bleeding.  Hemoglobin is decreased from previous.  Patient had a hysterectomy so she does not have any menstrual blood loss.  The symptoms have been ongoing for couple of months.  I will consult with GI to discuss admission for further evaluation versus close outpatient follow-up.  I discussed case with Dr. Deno Etienne.  She feels like the patient can be managed as an outpatient.  I agree as the patient does not appear to be having any symptoms of lightheadedness or dizziness.  She has been having the symptoms for a couple of months now.  Will discharge home with prescription for iron tablets.  Warning signs and precautions discussed. Final Clinical Impression(s) / ED Diagnoses Final diagnoses:  Gastrointestinal hemorrhage, unspecified gastrointestinal hemorrhage type  Anemia, unspecified type    Rx / DC Orders ED Discharge Orders          Ordered    ferrous sulfate 325 (65 FE) MG tablet  Daily        07/10/20 1034             Dorie Rank, MD 07/10/20 1035

## 2020-09-07 ENCOUNTER — Other Ambulatory Visit: Payer: Self-pay

## 2020-09-20 NOTE — Progress Notes (Signed)
Erroneous encounter

## 2020-09-21 ENCOUNTER — Other Ambulatory Visit: Payer: Self-pay

## 2020-09-21 ENCOUNTER — Encounter: Payer: Self-pay | Admitting: Family

## 2020-09-21 DIAGNOSIS — Z7689 Persons encountering health services in other specified circumstances: Secondary | ICD-10-CM

## 2020-09-30 ENCOUNTER — Other Ambulatory Visit: Payer: Self-pay

## 2020-09-30 DIAGNOSIS — D250 Submucous leiomyoma of uterus: Secondary | ICD-10-CM

## 2020-09-30 NOTE — Progress Notes (Signed)
Orders entered per phlebotomy

## 2020-10-01 ENCOUNTER — Ambulatory Visit: Payer: Self-pay | Admitting: Family

## 2020-10-08 ENCOUNTER — Inpatient Hospital Stay: Payer: Medicaid Other | Attending: Oncology

## 2020-10-11 NOTE — Progress Notes (Deleted)
New Hematology/Oncology Consult   Requesting MD: Vicie Mutters, [place]  [phone]  Reason for Consult: Iron deficiency anemia  HPI: Barbara Valdez is a 45 year old woman referred for evaluation of iron deficiency anemia.     Past Medical History:  Diagnosis Date   Anemia    Anxiety    Complication of anesthesia    took a long time to wake up after surgery  :   Past Surgical History:  Procedure Laterality Date   ABDOMINAL HYSTERECTOMY Bilateral 09/26/2016   Procedure: HYSTERECTOMY ABDOMINAL W/ BILATERAL SALPINGECTOMY;  Surgeon: Emily Filbert, MD;  Location: [facility];  Service: Gynecology;  Laterality: Bilateral;   BREAST BIOPSY Left    TUBAL LIGATION    :   Current Outpatient Medications:    dicyclomine (BENTYL) 20 MG tablet, Take 1 tablet (20 mg total) by mouth 4 (four) times daily as needed (Abdominal cramping)., Disp: 20 tablet, Rfl: 0   ferrous sulfate 325 (65 FE) MG tablet, Take 1 tablet (325 mg total) by mouth daily., Disp: 30 tablet, Rfl: 0   Multiple Vitamins-Minerals (ALIVE WOMENS GUMMY) CHEW, Chew 1 tablet by mouth 2 (two) times daily., Disp: , Rfl:    ondansetron (ZOFRAN) 8 MG tablet, Take 1 tablet (8 mg total) by mouth every 4 (four) hours as needed for nausea or vomiting., Disp: 10 tablet, Rfl: 0:  No Known Allergies:  FH:  SOCIAL HISTORY:  Review of Systems:  Physical Exam:  Last menstrual period 10/23/2016.  HEENT: *** Lungs: *** Cardiac: *** Abdomen: *** GU: ***  Vascular: *** Lymph nodes: *** Neurologic: *** Skin: *** Musculoskeletal: ***  LABS:  No results for input(s): WBC, HGB, HCT, PLT in the last 72 hours.  No results for input(s): NA, K, CL, CO2, GLUCOSE, BUN, CREATININE, CALCIUM in the last 72 hours.    RADIOLOGY:  No results found.  Assessment and Plan:   ***    Ned Card, NP 10/11/2020, 4:37 PM

## 2020-10-12 ENCOUNTER — Inpatient Hospital Stay: Payer: Medicaid Other | Admitting: Nurse Practitioner

## 2020-11-27 ENCOUNTER — Encounter (HOSPITAL_COMMUNITY): Payer: Self-pay | Admitting: Oncology

## 2020-11-27 ENCOUNTER — Inpatient Hospital Stay (HOSPITAL_COMMUNITY)
Admission: EM | Admit: 2020-11-27 | Discharge: 2020-12-01 | DRG: 385 | Disposition: A | Discharge status: Home / Self Care | Payer: Self-pay | Attending: Family Medicine | Admitting: Family Medicine

## 2020-11-27 ENCOUNTER — Emergency Department (HOSPITAL_COMMUNITY): Payer: Self-pay

## 2020-11-27 ENCOUNTER — Other Ambulatory Visit: Payer: Self-pay

## 2020-11-27 DIAGNOSIS — K51314 Ulcerative (chronic) rectosigmoiditis with abscess: Secondary | ICD-10-CM | POA: Diagnosis present

## 2020-11-27 DIAGNOSIS — R634 Abnormal weight loss: Secondary | ICD-10-CM | POA: Diagnosis present

## 2020-11-27 DIAGNOSIS — K630 Abscess of intestine: Secondary | ICD-10-CM

## 2020-11-27 DIAGNOSIS — R339 Retention of urine, unspecified: Secondary | ICD-10-CM | POA: Diagnosis present

## 2020-11-27 DIAGNOSIS — K529 Noninfective gastroenteritis and colitis, unspecified: Secondary | ICD-10-CM | POA: Insufficient documentation

## 2020-11-27 DIAGNOSIS — Z6824 Body mass index (BMI) 24.0-24.9, adult: Secondary | ICD-10-CM

## 2020-11-27 DIAGNOSIS — Z90710 Acquired absence of both cervix and uterus: Secondary | ICD-10-CM

## 2020-11-27 DIAGNOSIS — K5900 Constipation, unspecified: Secondary | ICD-10-CM

## 2020-11-27 DIAGNOSIS — K625 Hemorrhage of anus and rectum: Secondary | ICD-10-CM

## 2020-11-27 DIAGNOSIS — K651 Peritoneal abscess: Secondary | ICD-10-CM | POA: Diagnosis present

## 2020-11-27 DIAGNOSIS — Z8249 Family history of ischemic heart disease and other diseases of the circulatory system: Secondary | ICD-10-CM

## 2020-11-27 DIAGNOSIS — G8929 Other chronic pain: Secondary | ICD-10-CM | POA: Diagnosis present

## 2020-11-27 DIAGNOSIS — K51311 Ulcerative (chronic) rectosigmoiditis with rectal bleeding: Principal | ICD-10-CM | POA: Diagnosis present

## 2020-11-27 DIAGNOSIS — Z20822 Contact with and (suspected) exposure to covid-19: Secondary | ICD-10-CM | POA: Diagnosis present

## 2020-11-27 DIAGNOSIS — Z83438 Family history of other disorder of lipoprotein metabolism and other lipidemia: Secondary | ICD-10-CM

## 2020-11-27 DIAGNOSIS — D509 Iron deficiency anemia, unspecified: Secondary | ICD-10-CM | POA: Diagnosis present

## 2020-11-27 DIAGNOSIS — D500 Iron deficiency anemia secondary to blood loss (chronic): Secondary | ICD-10-CM | POA: Diagnosis present

## 2020-11-27 LAB — RESP PANEL BY RT-PCR (FLU A&B, COVID) ARPGX2
Influenza A by PCR: NEGATIVE
Influenza B by PCR: NEGATIVE
SARS Coronavirus 2 by RT PCR: NEGATIVE

## 2020-11-27 LAB — COMPREHENSIVE METABOLIC PANEL
ALT: 8 U/L (ref 0–44)
AST: 13 U/L — ABNORMAL LOW (ref 15–41)
Albumin: 3.5 g/dL (ref 3.5–5.0)
Alkaline Phosphatase: 82 U/L (ref 38–126)
Anion gap: 8 (ref 5–15)
BUN: 14 mg/dL (ref 6–20)
CO2: 23 mmol/L (ref 22–32)
Calcium: 9 mg/dL (ref 8.9–10.3)
Chloride: 106 mmol/L (ref 98–111)
Creatinine, Ser: 0.82 mg/dL (ref 0.44–1.00)
GFR, Estimated: >60 mL/min (ref >60)
Glucose, Bld: 95 mg/dL (ref 70–99)
Potassium: 4 mmol/L (ref 3.5–5.1)
Sodium: 137 mmol/L (ref 135–145)
Total Bilirubin: 0.7 mg/dL (ref 0.3–1.2)
Total Protein: 8 g/dL (ref 6.5–8.1)

## 2020-11-27 LAB — URINALYSIS, ROUTINE W REFLEX MICROSCOPIC
Bilirubin Urine: NEGATIVE
Glucose, UA: NEGATIVE mg/dL
Hgb urine dipstick: NEGATIVE
Ketones, ur: 80 mg/dL — AB
Leukocytes,Ua: NEGATIVE
Nitrite: NEGATIVE
Protein, ur: NEGATIVE mg/dL
Specific Gravity, Urine: 1.02 (ref 1.005–1.030)
pH: 5 (ref 5.0–8.0)

## 2020-11-27 LAB — CBC
HCT: 31.5 % — ABNORMAL LOW (ref 36.0–46.0)
Hemoglobin: 9.6 g/dL — ABNORMAL LOW (ref 12.0–15.0)
MCH: 23.4 pg — ABNORMAL LOW (ref 26.0–34.0)
MCHC: 30.5 g/dL (ref 30.0–36.0)
MCV: 76.8 fL — ABNORMAL LOW (ref 80.0–100.0)
Platelets: 379 10*3/uL (ref 150–400)
RBC: 4.1 MIL/uL (ref 3.87–5.11)
RDW: 16.7 % — ABNORMAL HIGH (ref 11.5–15.5)
WBC: 10.2 10*3/uL (ref 4.0–10.5)
nRBC: 0 % (ref 0.0–0.2)

## 2020-11-27 LAB — POC OCCULT BLOOD, ED: Fecal Occult Bld: POSITIVE — AB

## 2020-11-27 LAB — LIPASE, BLOOD: Lipase: 19 U/L (ref 11–51)

## 2020-11-27 MED ORDER — ONDANSETRON HCL 4 MG/2ML IJ SOLN
4.0000 mg | Freq: Four times a day (QID) | INTRAMUSCULAR | Status: DC | PRN
  Administered 2020-11-28: 4 mg via INTRAVENOUS
  Filled 2020-11-27: qty 2

## 2020-11-27 MED ORDER — CEFTRIAXONE SODIUM 2 G IJ SOLR
2.0000 g | INTRAMUSCULAR | Status: DC
  Administered 2020-11-28: 2 g via INTRAVENOUS
  Filled 2020-11-27 (×2): qty 20

## 2020-11-27 MED ORDER — METRONIDAZOLE 500 MG/100ML IV SOLN
500.0000 mg | Freq: Two times a day (BID) | INTRAVENOUS | Status: DC
  Administered 2020-11-28 – 2020-11-29 (×3): 500 mg via INTRAVENOUS
  Filled 2020-11-27 (×3): qty 100

## 2020-11-27 MED ORDER — TRAMADOL HCL 50 MG PO TABS
50.0000 mg | ORAL_TABLET | Freq: Four times a day (QID) | ORAL | Status: DC | PRN
  Administered 2020-11-30 – 2020-12-01 (×3): 50 mg via ORAL
  Filled 2020-11-27 (×3): qty 1

## 2020-11-27 MED ORDER — IOHEXOL 350 MG/ML SOLN
80.0000 mL | Freq: Once | INTRAVENOUS | Status: AC | PRN
  Administered 2020-11-27: 80 mL via INTRAVENOUS

## 2020-11-27 MED ORDER — OXYCODONE HCL 5 MG PO TABS
5.0000 mg | ORAL_TABLET | ORAL | Status: DC | PRN
  Administered 2020-11-28: 5 mg via ORAL
  Filled 2020-11-27: qty 1
  Filled 2020-11-27: qty 2

## 2020-11-27 MED ORDER — KCL IN DEXTROSE-NACL 20-5-0.9 MEQ/L-%-% IV SOLN
INTRAVENOUS | Status: DC
  Filled 2020-11-27 (×8): qty 1000

## 2020-11-27 MED ORDER — SODIUM CHLORIDE 0.9 % IV SOLN
2.0000 g | Freq: Once | INTRAVENOUS | Status: AC
  Administered 2020-11-27: 2 g via INTRAVENOUS
  Filled 2020-11-27: qty 20

## 2020-11-27 MED ORDER — ONDANSETRON HCL 4 MG PO TABS: 4.0000 mg | ORAL_TABLET | Freq: Four times a day (QID) | ORAL | Status: DC | PRN

## 2020-11-27 MED ORDER — ACETAMINOPHEN 325 MG PO TABS
650.0000 mg | ORAL_TABLET | Freq: Four times a day (QID) | ORAL | Status: DC | PRN
  Administered 2020-11-29: 650 mg via ORAL
  Filled 2020-11-27: qty 2

## 2020-11-27 MED ORDER — DOCUSATE SODIUM 100 MG PO CAPS
100.0000 mg | ORAL_CAPSULE | Freq: Two times a day (BID) | ORAL | Status: DC
  Administered 2020-11-27 – 2020-11-29 (×4): 100 mg via ORAL
  Filled 2020-11-27 (×4): qty 1

## 2020-11-27 MED ORDER — SODIUM CHLORIDE 0.9 % IV SOLN: 2.0000 g | Freq: Once | INTRAVENOUS | Status: DC

## 2020-11-27 MED ORDER — METRONIDAZOLE 500 MG/100ML IV SOLN
500.0000 mg | Freq: Once | INTRAVENOUS | Status: AC
  Administered 2020-11-27: 500 mg via INTRAVENOUS
  Filled 2020-11-27: qty 100

## 2020-11-27 MED ORDER — BOOST / RESOURCE BREEZE PO LIQD CUSTOM
1.0000 | Freq: Three times a day (TID) | ORAL | Status: DC
  Administered 2020-11-27 – 2020-12-01 (×9): 1 via ORAL

## 2020-11-27 NOTE — ED Provider Notes (Signed)
[facility] DEPT Provider Note   CSN: [id number] Arrival date & time: 11/27/20  1214     History Chief Complaint  Patient presents with   Abdominal Pain    Barbara Valdez is a 45 y.o. female who presents to the emergency department with chronic ongoing abdominal pain that has been acutely worse over the last couple days.  She states that she has been constipated and unable to have a bowel movement for 2 days.  She states that she was having drops of blood around the toilet today.  Pain is localized to the entire lower abdomen which she rates a 7/10 in severity.  She also reports associated urinary retention.  She denies nausea, vomiting, diarrhea, fever, chills.  She was seen and evaluated at gastroenterology was scheduled for colonoscopy but patient states that this fell through.  She is unable to follow-up secondary to insurance issues.   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   Anemia 07/18/2016    Past Surgical History:  Procedure Laterality Date   ABDOMINAL HYSTERECTOMY Bilateral 09/26/2016   Procedure: HYSTERECTOMY ABDOMINAL W/ BILATERAL SALPINGECTOMY;  Surgeon: Emily Filbert, MD;  Location: [facility];  Service: Gynecology;  Laterality: Bilateral;   BREAST BIOPSY Left    TUBAL LIGATION       OB History     Gravida  2   Para  2   Term  2   Preterm      AB      Living  2      SAB      IAB      Ectopic      Multiple      Live Births  2           Family History  Problem Relation Age of Onset   Hyperlipidemia Father    Hypertension Other    Breast cancer Neg Hx     Social History   Tobacco Use    Smoking status: Never   Smokeless tobacco: Never  Vaping Use   Vaping Use: Never used  Substance Use Topics   Alcohol use: No   Drug use: No    Home Medications Prior to Admission medications   Medication Sig Start Date End Date Taking? Authorizing Provider  dicyclomine (BENTYL) 20 MG tablet Take 1 tablet (20 mg total) by mouth 4 (four) times daily as needed (Abdominal cramping). 05/05/20   Molpus, John, MD  ferrous sulfate 325 (65 FE) MG tablet Take 1 tablet (325 mg total) by mouth daily. 07/10/20   Dorie Rank, MD  Multiple Vitamins-Minerals (ALIVE WOMENS GUMMY) CHEW Chew 1 tablet by mouth 2 (two) times daily.    [provider]  ondansetron (ZOFRAN) 8 MG tablet Take 1 tablet (8 mg total) by mouth every 4 (four) hours as needed for nausea or vomiting. 05/05/20   Molpus, Jenny Reichmann, MD    Allergies    Patient has no known allergies.  Review of Systems   Review of Systems  Gastrointestinal:  Positive for abdominal pain.  All other systems reviewed and are negative.  Physical  Exam Updated Vital Signs BP (!) 109/96 (BP Location: Left Arm)   Pulse (!) 103   Temp 99.9 F (37.7 C) (Oral)   Resp 20   Ht 5' 6"  (1.676 m)   Wt 68 kg   LMP 10/23/2016   SpO2 99%   BMI 24.21 kg/m   Physical Exam Vitals and nursing note reviewed.  Constitutional:      General: She is not in acute distress.    Appearance: Normal appearance.  HENT:     Head: Normocephalic and atraumatic.  Eyes:     General:        Right eye: No discharge.        Left eye: No discharge.  Cardiovascular:     Comments: Regular rate and rhythm.  S1/S2 are distinct without any evidence of murmur, rubs, or gallops.  Radial pulses are 2+ bilaterally.  Dorsalis pedis pulses are 2+ bilaterally.  No evidence of pedal edema. Pulmonary:     Comments: Clear to auscultation bilaterally.  Normal effort.  No respiratory distress.  No evidence of wheezes, rales, or rhonchi heard throughout. Abdominal:     General: Abdomen is  flat. Bowel sounds are normal. There is distension.     Tenderness: There is no guarding or rebound.     Comments: Moderate lower abdominal tenderness.  Musculoskeletal:        General: Normal range of motion.     Cervical back: Neck supple.  Skin:    General: Skin is warm and dry.     Findings: No rash.  Neurological:     General: No focal deficit present.     Mental Status: She is alert.  Psychiatric:        Mood and Affect: Mood normal.        Behavior: Behavior normal.    ED Results / Procedures / Treatments   Labs (all labs ordered are listed, but only abnormal results are displayed) Labs Reviewed  COMPREHENSIVE METABOLIC PANEL - Abnormal; Notable for the following components:      Result Value   AST 13 (*)    All other components within normal limits  CBC - Abnormal; Notable for the following components:   Hemoglobin 9.6 (*)    HCT 31.5 (*)    MCV 76.8 (*)    MCH 23.4 (*)    RDW 16.7 (*)    All other components within normal limits  LIPASE, BLOOD  URINALYSIS, ROUTINE W REFLEX MICROSCOPIC    EKG None  Radiology No results found.  Procedures Procedures   Medications Ordered in ED Medications - No data to display  ED Course  I have reviewed the triage vital signs and the nursing notes.  Pertinent labs & imaging results that were available during my care of the patient were reviewed by me and considered in my medical decision making (see chart for details).  Clinical Course as of 11/28/20 3329  Sat Nov 27, 2020  1557 Rectal exam normal, Hem-Occult obtained [GL]    Clinical Course User Index [GL] Loeffler, Adora Fridge, PA-C   MDM Rules/Calculators/A&P                          Barbara Valdez is a 45 y.o. female who presents to the emergency department for evaluation of chronic and ongoing abdominal pain.  This is likely secondary to constipation.  I have a low suspicion for bowel obstruction at this time.  Patient was adamant that  she wanted to get imaging.   I will order CT abdomen pelvis for further evaluation.  I will also get a bladder scan to see if she is having urinary retention.  Basic labs were ordered in triage.  Patient currently declines any pain medication.  Basic labs were ordered in triage.  CBC was without leukocytosis.  CMP is normal.  Urinalysis is pending.  Point-of-care occult blood is pending.  CT abdomen is pending.  At this point it is unclear what is going on with the patient.  I suspect this is likely constipation versus infectious etiology.  Pain the rest of the work-up or rest of her care was transferred to Paulita Cradle, PA-C.   Final Clinical Impression(s) / ED Diagnoses Final diagnoses:  None    Rx / DC Orders ED Discharge Orders     None        Hendricks Limes, PA-C 11/28/20 0700    Dorie Rank, MD 11/28/20 [phone]

## 2020-11-27 NOTE — ED Provider Notes (Signed)
Emergency Medicine Provider Triage Evaluation Note  Barbara Valdez , a 45 y.o. female  was evaluated in triage.  Pt complains of mid abdominal cramping with gas. Now unable to void, occasional drops of blood from rectum. Pain occurs every 5 minutes. Last able to void 11:30am today. Ongoing for several months. Referred to GI but unable to be seen due to insurance problem. Prior abdominal surgery- hysterectomy 4 years ago. Scheduled for colonoscopy however prep was inadequate and has to reschedule.  Review of Systems  Positive: Abdominal pain Negative: fever  Physical Exam  BP (!) 109/96 (BP Location: Left Arm)   Pulse (!) 103   Temp 99.9 F (37.7 C) (Oral)   Resp 20   Ht 5' 6"  (1.676 m)   Wt 68 kg   LMP 10/23/2016   SpO2 99%   BMI 24.21 kg/m  Gen:   Awake, no distress   Resp:  Normal effort  MSK:   Moves extremities without difficulty  Other:    Medical Decision Making  Medically screening exam initiated at 1:04 PM.  Appropriate orders placed.  Barbara Valdez was informed that the remainder of the evaluation will be completed by another provider, this initial triage assessment does not replace that evaluation, and the importance of remaining in the ED until their evaluation is complete.     Tacy Learn, PA-C 11/27/20 1307    Dorie Rank, MD 11/28/20 [phone]

## 2020-11-27 NOTE — ED Notes (Signed)
Pt continues to deny being able to give a urine sample at this time. Pt bladder scanned by ED tech and 14m's urine present in bladder. PA-C GPaulita Cradlenotified.

## 2020-11-27 NOTE — H&P (Signed)
ADMISSION HISTORY AND PHYSICAL   Barbara Valdez OEU:[id number] DOB: 1975/07/17 DOA: 11/27/2020  PCP: Pcp, No Patient coming from: home via [facility] ED  Chief Complaint: abdominal pain   HPI:  45yo w/ a hx of an abdom hysterectomy for menorrhagia due to fibroids but no chronic medical problems who has been undergoing an outpatient evaluation for abdominal pain which has persisted for ~ 60month. [facility]. This w/u has included an attempted colonscopy which needed to be rescheduled due to insufficient bowel prep. She presented to the ED today due to 2 days of acute worsening of her abdom pain, with an inability to defecate over the same period of time. CT abdom/pelvis in the ED today noted proctocolitis involving the mid and distal sigmoid colon and upper rectum with a 3.9 cm gas containing interloop abscess centered in the sigmoid mesocolon, as well as a severe fecal burden w/o evidence of an obstruction. On HPI she relates a 20# unintentional weight loss over the last 9 months.   Assessment/Plan  Proctocolitis of sigmoid colon/upper rectum w/ 3.9cm abscess Begin empiric abx tx - discuss w/ GI - may need Gen Surgery or IR consult - discussed w/ GI (Dr. OPaulita Fujita who has agreed to see her in the AM   GI Bleeding  Fecal occult + in ED - check anemia panel   Microcytic anemia  Hgb 9.6 w/ MCV 77 at time of admit - pt s/p hysterectomy - a worrisome finding in this patient who is post-hysterectomy and therefore has no other source of expected blood loss    DVT prophylaxis: SCDs Code Status: FULL Family Communication: spoke w/ companion in ED w/ permission of patient  Disposition Plan:  Admit to Inpatient  Consults called: none indicated  Review of Systems: As per HPI otherwise 10 point review of systems negative.   Past Medical History:  Diagnosis Date   Anemia    Anxiety    Complication of anesthesia    took a long time to wake up after  surgery    Past Surgical History:  Procedure Laterality Date   ABDOMINAL HYSTERECTOMY Bilateral 09/26/2016   Procedure: HYSTERECTOMY ABDOMINAL W/ BILATERAL SALPINGECTOMY;  Surgeon: DEmily Filbert MD;  [facility];  Service: Gynecology;  Laterality: Bilateral;   BREAST BIOPSY Left    TUBAL LIGATION      Family History  Family History  Problem Relation Age of Onset   Hyperlipidemia Father    Hypertension Other    Breast cancer Neg Hx     Social History   reports that she has never smoked. She has never used smokeless tobacco. She reports that she does not drink alcohol and does not use drugs.  Allergies No Known Allergies  Prior to Admission medications   Medication Sig Start Date End Date Taking? Authorizing Provider  acetaminophen (TYLENOL) 500 MG tablet Take 1,000 mg by mouth every 6 (six) hours as needed for mild pain.   Yes [provider]  ibuprofen (ADVIL) 200 MG tablet Take 200 mg by mouth every 6 (six) hours as needed for mild pain.   Yes [provider]    Physical Exam: Vitals:   11/27/20 1154011/05/22 1514 11/27/20 1515 11/27/20 1743  BP: 132/80  (!) 138/91 100/89  Pulse: 91 91  99  Resp: 18   16  Temp: 99.2 F (37.3 C)   99.1 F (37.3 C)  TempSrc: Oral   Oral  SpO2: 100% 100%  100%  Weight: 68 kg     Height: 5' 6"  (1.676 m)       Constitutional: NAD, calm, comfortable Eyes: PERRL, lids and conjunctivae normal ENMT: Mucous membranes are moist.  Neck: normal, supple, no masses, no thyromegaly Respiratory: clear to auscultation bilaterally, no wheezing, no crackles. Normal respiratory effort. No accessory muscle use.  Cardiovascular: Regular rate and rhythm, no murmurs / rubs / gallops. No extremity edema. 2+ pedal pulses.  Abdomen: Non-distended - soft - tender to palpation in B lower quadrants and suprapubic area - no palpable mass Musculoskeletal: No clubbing / cyanosis. No joint deformity upper and lower extremities. No  contractures. Normal muscle tone.  Skin: No rashes, lesions, ulcers.  Neurologic: CN 2-12 grossly intact B. Sensation intact.  Psychiatric: Normal judgment and insight. Alert and oriented x 3. Normal mood.    Labs on Admission:   CBC: Recent Labs  Lab 11/27/20 1256  WBC 10.2  HGB 9.6*  HCT 31.5*  MCV 76.8*  PLT 700   Basic Metabolic Panel: Recent Labs  Lab 11/27/20 1256  NA 137  K 4.0  CL 106  CO2 23  GLUCOSE 95  BUN 14  CREATININE 0.82  CALCIUM 9.0   GFR: Estimated Creatinine Clearance: 82 mL/min (by C-G formula based on SCr of 0.82 mg/dL).  Liver Function Tests: Recent Labs  Lab 11/27/20 1256  AST 13*  ALT 8  ALKPHOS 82  BILITOT 0.7  PROT 8.0  ALBUMIN 3.5   Recent Labs  Lab 11/27/20 1256  LIPASE 19   Urine analysis:    Component Value Date/Time   COLORURINE YELLOW 11/27/2020 [place] 11/27/2020 1645   LABSPEC 1.020 11/27/2020 1645   PHURINE 5.0 11/27/2020 [place] 11/27/2020 1645   HGBUR NEGATIVE 11/27/2020 1645   BILIRUBINUR NEGATIVE 11/27/2020 1645   KETONESUR 80 (A) 11/27/2020 1645   PROTEINUR NEGATIVE 11/27/2020 1645   UROBILINOGEN 0.2 09/27/2014 1718   NITRITE NEGATIVE 11/27/2020 1645   LEUKOCYTESUR NEGATIVE 11/27/2020 1645     Radiological Exams on Admission: CT ABDOMEN PELVIS W CONTRAST  Result Date: 11/27/2020 CLINICAL DATA:  Abdominal pain that is began Monday. Patient reports seen scant amounts of dark blood in stool. Feels bloated. EXAM: CT ABDOMEN AND PELVIS WITH CONTRAST TECHNIQUE: Multidetector CT imaging of the abdomen and pelvis was performed using the standard protocol following bolus administration of intravenous contrast. CONTRAST:  61m OMNIPAQUE IOHEXOL 350 MG/ML SOLN COMPARISON:  None. FINDINGS: Lower chest: Small focal tree-in-bud nodularity within the anterior right lower lobe (series 4, image 24). Hepatobiliary: A 2.7 x 2.3 cm round hypodense lesion with peripheral interrupted nodular  enhancement in the right lobe of the liver is consistent with a hemangioma (series 2, image 15). A 0.5 cm oval hypodensity in the posterior right hepatic lobe is too small to characterize but likely represents a small cyst (series 2, image 25). Riedel's lobe. No gallstones, gallbladder wall thickening, or bile duct dilatation. Pancreas: Unremarkable. No pancreatic ductal dilatation or surrounding inflammatory changes. Spleen: Normal in size without focal abnormality. Adrenals/Urinary Tract: Adrenal glands are unremarkable. Kidneys are normal, without renal calculi, focal lesion, or hydronephrosis. Bladder is unremarkable. Stomach/Bowel: The stomach and small bowel are unremarkable. Normal appendix. Severe fecal burden throughout the colon. There is marked diffuse wall thickening of the mid and distal sigmoid colon and upper rectum with mucosal hyperenhancement and adjacent fat stranding. An irregular shaped 3.9 x 2.8 x 2.0 cm rim enhancing interloop  fluid collection in the central posterior pelvis contains a small amount of fluid and air (series 2, image 68; series 6, image 88; series 5, images 95-98). Vascular/Lymphatic: No significant vascular findings are present. An enlarged 1 cm short axis left common iliac node is noted (series 2, image 60). A few other prominent, but not pathologically enlarged presacral nodes are noted. Reproductive: Hysterectomy.  Bilateral adnexa are unremarkable. Other: No free intraperitoneal air. Musculoskeletal: No acute or significant osseous findings. IMPRESSION: 1. Proctocolitis involving the mid and distal sigmoid colon and upper rectum with a 3.9 cm gas containing interloop abscess centered in the sigmoid mesocolon. 2. Severe fecal burden.  No findings of bowel obstruction. 3. An enlarged left common iliac lymph node is likely reactive. 4. A 2.7 cm hemangioma is noted in the right hepatic lobe. 5. Mild focal bronchiolitis in the right lower lobe. These results were called by  telephone at the time of interpretation on 11/27/2020 at 5:16 pm to provider GRACE LOEFFLER , who verbally acknowledged these results. Electronically Signed   By: Ileana Roup M.D.   On: 11/27/2020 17:19     Cherene Altes, MD [facility] Office  [phone] Pager - Text Page per Amion as per below:  On-Call/Text Page:      [email or web address]  If 7PM-7AM, please contact night-coverage [email or web address] 11/27/2020, 5:55 PM

## 2020-11-27 NOTE — ED Notes (Signed)
Shift change/report. Please contact courtney after 1920 so she can get report on other patients first

## 2020-11-27 NOTE — ED Provider Notes (Signed)
Accepted handoff at shift change from [facility]. Please see prior provider note for more detail.   Briefly: Patient is 45 y.o. female who presents to the emergency department with chronic ongoing abdominal pain that has been acutely worse over the last couple days.  She states that she has been constipated and unable to have a bowel movement for 2 days.  She states that she was having drops of blood around the toilet today.  Pain is localized to the entire lower abdomen which she rates a 7/10 in severity.  She also reports associated urinary retention.  She denies nausea, vomiting, diarrhea, fever, chills.  She was seen and evaluated at gastroenterology was scheduled for colonoscopy but patient states that this fell through.  She is unable to follow-up secondary to insurance issues.  Vitals have been stable. Labs have been unrevealing. CT abdomen pelvis ordered to r/o obstruction. Bladder Scan ordered to r/o urinary retention. UA pending. Hemoccult pending.  DDX: concern for chronic constipation, but more serious etiologies are still being ruled out  Plan: Pending CT A/P, bladder scan, UA, hem-occult results   Physical Exam  BP 132/80 (BP Location: Right Arm)   Pulse 91   Temp 99.2 F (37.3 C) (Oral)   Resp 18   Ht 5' 6"  (1.676 m)   Wt 68 kg   LMP 10/23/2016   SpO2 100%   BMI 24.21 kg/m   Physical Exam Vitals and nursing note reviewed.  Constitutional:      General: She is not in acute distress.    Appearance: Normal appearance. She is well-developed. She is not ill-appearing, toxic-appearing or diaphoretic.  HENT:     Head: Normocephalic and atraumatic.     Nose: No nasal deformity.     Mouth/Throat:     Lips: Pink. No lesions.     Mouth: Mucous membranes are moist. No injury, lacerations, oral lesions or angioedema.     Pharynx: Oropharynx is clear. Uvula midline. No pharyngeal swelling, oropharyngeal exudate, posterior oropharyngeal erythema or uvula swelling.  Eyes:      General: Gaze aligned appropriately. No scleral icterus.       Right eye: No discharge.        Left eye: No discharge.     Conjunctiva/sclera: Conjunctivae normal.     Right eye: Right conjunctiva is not injected. No exudate or hemorrhage.    Left eye: Left conjunctiva is not injected. No exudate or hemorrhage. Cardiovascular:     Rate and Rhythm: Normal rate and regular rhythm.     Pulses: Normal pulses.          Radial pulses are 2+ on the right side and 2+ on the left side.       Dorsalis pedis pulses are 2+ on the right side and 2+ on the left side.     Heart sounds: Normal heart sounds, S1 normal and S2 normal. Heart sounds not distant. No murmur heard.   No friction rub. No gallop. No S3 or S4 sounds.  Pulmonary:     Effort: Pulmonary effort is normal. No accessory muscle usage or respiratory distress.     Breath sounds: Normal breath sounds. No stridor. No wheezing, rhonchi or rales.  Chest:     Chest wall: No tenderness.  Abdominal:     General: Abdomen is flat. Bowel sounds are normal. There is no distension.     Palpations: Abdomen is soft. There is no mass or pulsatile mass.     Tenderness: There  is abdominal tenderness in the suprapubic area and left lower quadrant. There is no right CVA tenderness, left CVA tenderness, guarding or rebound. Negative signs include Murphy's sign, Rovsing's sign and McBurney's sign.     Hernia: No hernia is present.  Musculoskeletal:     Right lower leg: No edema.     Left lower leg: No edema.  Skin:    General: Skin is warm and dry.     Coloration: Skin is not jaundiced or pale.     Findings: No bruising, erythema, lesion or rash.  Neurological:     General: No focal deficit present.     Mental Status: She is alert and oriented to person, place, and time.     GCS: GCS eye subscore is 4. GCS verbal subscore is 5. GCS motor subscore is 6.  Psychiatric:        Mood and Affect: Mood normal.        Behavior: Behavior normal. Behavior is  cooperative.    ED Course/Procedures   Clinical Course as of 11/27/20 1714  Sat Nov 27, 2020  1557 Rectal exam normal, Hem-Occult obtained [GL]    Clinical Course User Index [GL] Jashaun Penrose, Adora Fridge, PA-C    Procedures  MDM   This Is a well-appearing 45 year old female presenting with acute on chronic abdominal pain and worsening constipation.  Work-up was primarily done by previous team.  Labs are overall unrevealing improving chronic anemia.  There is no leukocytosis.  Vital signs have remained stable.  Afebrile.  At time of shift handoff, we are waiting CT abdomen and pelvis, urine, Hemoccult, and bladder scan.  Dispo pending following these results   I have reviewed all labs and imaging. Bladder scan with 137 cc.  Patient was able to urinate following this. Stool Hem positive Urine with no signs of infection. CT scan returning with proctocolitis involving the mid and distal sigmoid colon and upper rectum times with an associated 3.9 cm gas containing interloop abscess that is centered in the sigmoid mesocolon.  There is severe fecal burden with no findings of bowel obstruction.  Also has an enlarged left common iliac lymph node that is likely reactive.  I think that these findings are likely causing the patient's acute onset of her chronic symptoms.  There is concern that patient may have an underlying malignancy or inflammatory bowel disorder that may have led to her current presentation.    We have started Flagyl and Rocephin.  Patient will need to be admitted for IV antibiotics and possible IR and GI consultation while admitted.  1745: Spoke with Dr. Thereasa Solo from [place]. Plan to admit patient to Med-Surg bed.       Sheila Oats 11/27/20 1910    Valarie Merino, MD 11/28/20 2317

## 2020-11-27 NOTE — ED Triage Notes (Signed)
Pt reports abdominal pain that began Monday.  Reports seeing scant amounts of dark blood in stool.  Feels as if gas is trapped in abdomen.

## 2020-11-28 ENCOUNTER — Encounter (HOSPITAL_COMMUNITY): Payer: Self-pay | Admitting: Internal Medicine

## 2020-11-28 DIAGNOSIS — D509 Iron deficiency anemia, unspecified: Secondary | ICD-10-CM

## 2020-11-28 LAB — COMPREHENSIVE METABOLIC PANEL
ALT: 8 U/L (ref 0–44)
AST: 12 U/L — ABNORMAL LOW (ref 15–41)
Albumin: 3.1 g/dL — ABNORMAL LOW (ref 3.5–5.0)
Alkaline Phosphatase: 78 U/L (ref 38–126)
Anion gap: 8 (ref 5–15)
BUN: 9 mg/dL (ref 6–20)
CO2: 22 mmol/L (ref 22–32)
Calcium: 8.5 mg/dL — ABNORMAL LOW (ref 8.9–10.3)
Chloride: 107 mmol/L (ref 98–111)
Creatinine, Ser: 0.8 mg/dL (ref 0.44–1.00)
GFR, Estimated: >60 mL/min (ref >60)
Glucose, Bld: 107 mg/dL — ABNORMAL HIGH (ref 70–99)
Potassium: 4 mmol/L (ref 3.5–5.1)
Sodium: 137 mmol/L (ref 135–145)
Total Bilirubin: 0.9 mg/dL (ref 0.3–1.2)
Total Protein: 7.2 g/dL (ref 6.5–8.1)

## 2020-11-28 LAB — CBC
HCT: 28.7 % — ABNORMAL LOW (ref 36.0–46.0)
Hemoglobin: 8.6 g/dL — ABNORMAL LOW (ref 12.0–15.0)
MCH: 22.8 pg — ABNORMAL LOW (ref 26.0–34.0)
MCHC: 30 g/dL (ref 30.0–36.0)
MCV: 76.1 fL — ABNORMAL LOW (ref 80.0–100.0)
Platelets: 358 10*3/uL (ref 150–400)
RBC: 3.77 MIL/uL — ABNORMAL LOW (ref 3.87–5.11)
RDW: 16.5 % — ABNORMAL HIGH (ref 11.5–15.5)
WBC: 9.2 10*3/uL (ref 4.0–10.5)
nRBC: 0 % (ref 0.0–0.2)

## 2020-11-28 LAB — VITAMIN B12: Vitamin B-12: 308 pg/mL (ref 180–914)

## 2020-11-28 LAB — FOLATE: Folate: 14.9 ng/mL (ref >5.9)

## 2020-11-28 LAB — PROTIME-INR
INR: 1.2 (ref 0.8–1.2)
Prothrombin Time: 14.7 seconds (ref 11.4–15.2)

## 2020-11-28 LAB — IRON AND TIBC
Iron: 12 ug/dL — ABNORMAL LOW (ref 28–170)
Saturation Ratios: 4 % — ABNORMAL LOW (ref 10.4–31.8)
TIBC: 310 ug/dL (ref 250–450)
UIBC: 298 ug/dL

## 2020-11-28 LAB — FERRITIN: Ferritin: 20 ng/mL (ref 11–307)

## 2020-11-28 LAB — RETICULOCYTES
Immature Retic Fract: 17.8 % — ABNORMAL HIGH (ref 2.3–15.9)
RBC.: 3.7 MIL/uL — ABNORMAL LOW (ref 3.87–5.11)
Retic Count, Absolute: 32.9 10*3/uL (ref 19.0–186.0)
Retic Ct Pct: 0.9 % (ref 0.4–3.1)

## 2020-11-28 LAB — HIV ANTIBODY (ROUTINE TESTING W REFLEX): HIV Screen 4th Generation wRfx: NONREACTIVE

## 2020-11-28 MED ORDER — ADULT MULTIVITAMIN W/MINERALS CH
1.0000 | ORAL_TABLET | Freq: Every day | ORAL | Status: DC
  Administered 2020-11-28 – 2020-12-01 (×4): 1 via ORAL
  Filled 2020-11-28 (×4): qty 1

## 2020-11-28 MED ORDER — POLYETHYLENE GLYCOL 3350 17 G PO PACK
17.0000 g | PACK | Freq: Two times a day (BID) | ORAL | Status: DC
  Administered 2020-11-28 – 2020-11-29 (×3): 17 g via ORAL
  Filled 2020-11-28 (×3): qty 1

## 2020-11-28 MED ORDER — CYANOCOBALAMIN 1000 MCG/ML IJ SOLN
1000.0000 ug | Freq: Every day | INTRAMUSCULAR | Status: AC
  Administered 2020-11-28 – 2020-11-30 (×3): 1000 ug via SUBCUTANEOUS
  Filled 2020-11-28 (×3): qty 1

## 2020-11-28 MED ORDER — BISACODYL 10 MG RE SUPP
10.0000 mg | Freq: Once | RECTAL | Status: AC
  Administered 2020-11-28: 10 mg via RECTAL
  Filled 2020-11-28: qty 1

## 2020-11-28 MED ORDER — PROSOURCE PLUS PO LIQD
30.0000 mL | Freq: Two times a day (BID) | ORAL | Status: DC
  Administered 2020-11-28 – 2020-11-30 (×5): 30 mL via ORAL
  Filled 2020-11-28 (×6): qty 30

## 2020-11-28 NOTE — Consult Note (Signed)
Reason for Consult: abdominal pain, colitis, intra-abdominal abscess  Referring Physician: Dr. Malen Gauze, [facility]  Barbara Valdez is an 45 y.o. female.  HPI: Patient is a pleasant 45 year old female admitted from the emergency department to the medical service for evaluation of colitis involving the rectosigmoid colon.  Patient states that she has had symptoms of abdominal pain, cramping, and occasional bleeding per rectum since April 2022.  She was scheduled for evaluation by gastroenterology but unfortunately her prep was not adequate for them to perform colonoscopy.  Patient has remained symptomatic and presented to the emergency department.  She was found to be anemic with a hemoglobin of 9.6 and heme positive stool.  White blood cell count was normal at the upper range of normal at 10.2.  Patient is afebrile.  CT scan of the abdomen and pelvis demonstrated inflammatory changes involving the distal sigmoid colon and rectum.  There appears to be a gas and fluid-filled collection adjacent to the distal sigmoid colon possibly representing an abscess.  Patient has no prior history of diverticular disease.  She has no history of inflammatory bowel disease.  She is undergone a previous abdominal hysterectomy for dysfunctional bleeding.  She has had no other abdominal surgery except for a tubal ligation.  General surgery is asked to evaluate at this time for colitis involving the rectosigmoid colon and possible adjacent abscess formation.  Past Medical History:  Diagnosis Date   Anemia    Anxiety    Complication of anesthesia    took a long time to wake up after surgery    Past Surgical History:  Procedure Laterality Date   ABDOMINAL HYSTERECTOMY Bilateral 09/26/2016   Procedure: HYSTERECTOMY ABDOMINAL W/ BILATERAL SALPINGECTOMY;  Surgeon: Emily Filbert, MD;  Location: [facility];  Service: Gynecology;  Laterality: Bilateral;   BREAST BIOPSY Left    TUBAL LIGATION      Family  History  Problem Relation Age of Onset   Hyperlipidemia Father    Hypertension Other    Breast cancer Neg Hx     Social History:  reports that she has never smoked. She has never used smokeless tobacco. She reports that she does not drink alcohol and does not use drugs.  Allergies: No Known Allergies  Medications: I have reviewed the patient's current medications.  Results for orders placed or performed during the hospital encounter of 11/27/20 (from the past 48 hour(s))  Lipase, blood     Status: None   Collection Time: 11/27/20 12:56 PM  Result Value Ref Range   Lipase 19 11 - 51 U/L    Comment: Performed at [facility], [place] [address], [place], [place] [address]  Comprehensive metabolic panel     Status: Abnormal   Collection Time: 11/27/20 12:56 PM  Result Value Ref Range   Sodium 137 135 - 145 mmol/L   Potassium 4.0 3.5 - 5.1 mmol/L   Chloride 106 98 - 111 mmol/L   CO2 23 22 - 32 mmol/L   Glucose, Bld 95 70 - 99 mg/dL    Comment: Glucose reference range applies only to samples taken after fasting for at least 8 hours.   BUN 14 6 - 20 mg/dL   Creatinine, Ser 0.82 0.44 - 1.00 mg/dL   Calcium 9.0 8.9 - 10.3 mg/dL   Total Protein 8.0 6.5 - 8.1 g/dL   Albumin 3.5 3.5 - 5.0 g/dL   AST 13 (L) 15 - 41 U/L   ALT 8 0 -  44 U/L   Alkaline Phosphatase 82 38 - 126 U/L   Total Bilirubin 0.7 0.3 - 1.2 mg/dL   GFR, Estimated >60 >60 mL/min    Comment: (NOTE) Calculated using the CKD-EPI Creatinine Equation (2021)    Anion gap 8 5 - 15    Comment: Performed at [facility], [place] [address] [place], [place] [address]  CBC     Status: Abnormal   Collection Time: 11/27/20 12:56 PM  Result Value Ref Range   WBC 10.2 4.0 - 10.5 K/uL   RBC 4.10 3.87 - 5.11 MIL/uL   Hemoglobin 9.6 (L) 12.0 - 15.0 g/dL   HCT 31.5 (L) 36.0 - 46.0 %   MCV 76.8 (L) 80.0 - 100.0 fL   MCH 23.4 (L) 26.0 - 34.0 pg   MCHC 30.5 30.0 - 36.0 g/dL   RDW 16.7 (H) 11.5 - 15.5  %   Platelets 379 150 - 400 K/uL   nRBC 0.0 0.0 - 0.2 %    Comment: Performed at [facility], [place] [address] [place], [place] [address]  Urinalysis, Routine w reflex microscopic     Status: Abnormal   Collection Time: 11/27/20  4:45 PM  Result Value Ref Range   Color, Urine YELLOW YELLOW   APPearance CLEAR CLEAR   Specific Gravity, Urine 1.020 1.005 - 1.030   pH 5.0 5.0 - 8.0   Glucose, UA NEGATIVE NEGATIVE mg/dL   Hgb urine dipstick NEGATIVE NEGATIVE   Bilirubin Urine NEGATIVE NEGATIVE   Ketones, ur 80 (A) NEGATIVE mg/dL   Protein, ur NEGATIVE NEGATIVE mg/dL   Nitrite NEGATIVE NEGATIVE   Leukocytes,Ua NEGATIVE NEGATIVE    Comment: Performed at [facility] [address], [place], [place] [address]  POC occult blood, ED     Status: Abnormal   Collection Time: 11/27/20  4:52 PM  Result Value Ref Range   Fecal Occult Bld POSITIVE (A) NEGATIVE  Resp Panel by RT-PCR (Flu A&B, Covid) Nasopharyngeal Swab     Status: None   Collection Time: 11/27/20  5:22 PM   Specimen: Nasopharyngeal Swab; Nasopharyngeal(NP) swabs in vial transport medium  Result Value Ref Range   SARS Coronavirus 2 by RT PCR NEGATIVE NEGATIVE    Comment: (NOTE) SARS-CoV-2 target nucleic acids are NOT DETECTED.  The SARS-CoV-2 RNA is generally detectable in upper respiratory specimens during the acute phase of infection. The lowest concentration of SARS-CoV-2 viral copies this assay can detect is 138 copies/mL. A negative result does not preclude SARS-Cov-2 infection and should not be used as the sole basis for treatment or other patient management decisions. A negative result may occur with  improper specimen collection/handling, submission of specimen other than nasopharyngeal swab, presence of viral mutation(s) within the areas targeted by this assay, and inadequate number of viral copies(<138 copies/mL). A negative result must be combined with clinical observations,  patient history, and epidemiological information. The expected result is Negative.  Fact Sheet for Patients:  EntrepreneurPulse.com.au  Fact Sheet for Healthcare Providers:  IncredibleEmployment.be  This test is no t yet approved or cleared by the [place] FDA and  has been authorized for detection and/or diagnosis of SARS-CoV-2 by FDA under an Emergency Use Authorization (EUA). This EUA will remain  in effect (meaning this test can be used) for the duration of the COVID-19 declaration under Section 564(b)(1) of the Act, 21 U.S.C.section 360bbb-3(b)(1), unless the authorization is terminated  or revoked sooner.       Influenza  A by PCR NEGATIVE NEGATIVE   Influenza B by PCR NEGATIVE NEGATIVE    Comment: (NOTE) The Xpert Xpress SARS-CoV-2/FLU/RSV plus assay is intended as an aid in the diagnosis of influenza from Nasopharyngeal swab specimens and should not be used as a sole basis for treatment. Nasal washings and aspirates are unacceptable for Xpert Xpress SARS-CoV-2/FLU/RSV testing.  Fact Sheet for Patients: EntrepreneurPulse.com.au  Fact Sheet for Healthcare Providers: IncredibleEmployment.be  This test is not yet approved or cleared by the [place] FDA and has been authorized for detection and/or diagnosis of SARS-CoV-2 by FDA under an Emergency Use Authorization (EUA). This EUA will remain in effect (meaning this test can be used) for the duration of the COVID-19 declaration under Section 564(b)(1) of the Act, 21 U.S.C. section 360bbb-3(b)(1), unless the authorization is terminated or revoked.  Performed at [facility], [place] [address], [place], [place] [address]   Comprehensive metabolic panel     Status: Abnormal   Collection Time: 11/28/20  3:09 AM  Result Value Ref Range   Sodium 137 135 - 145 mmol/L   Potassium 4.0 3.5 - 5.1 mmol/L   Chloride 107 98 - 111  mmol/L   CO2 22 22 - 32 mmol/L   Glucose, Bld 107 (H) 70 - 99 mg/dL    Comment: Glucose reference range applies only to samples taken after fasting for at least 8 hours.   BUN 9 6 - 20 mg/dL   Creatinine, Ser 0.80 0.44 - 1.00 mg/dL   Calcium 8.5 (L) 8.9 - 10.3 mg/dL   Total Protein 7.2 6.5 - 8.1 g/dL   Albumin 3.1 (L) 3.5 - 5.0 g/dL   AST 12 (L) 15 - 41 U/L   ALT 8 0 - 44 U/L   Alkaline Phosphatase 78 38 - 126 U/L   Total Bilirubin 0.9 0.3 - 1.2 mg/dL   GFR, Estimated >60 >60 mL/min    Comment: (NOTE) Calculated using the CKD-EPI Creatinine Equation (2021)    Anion gap 8 5 - 15    Comment: Performed at [facility], [place] [address] [place], [place] [address]  CBC     Status: Abnormal   Collection Time: 11/28/20  3:09 AM  Result Value Ref Range   WBC 9.2 4.0 - 10.5 K/uL   RBC 3.77 (L) 3.87 - 5.11 MIL/uL   Hemoglobin 8.6 (L) 12.0 - 15.0 g/dL    Comment: Reticulocyte Hemoglobin testing may be clinically indicated, consider ordering this additional test [id number]    HCT 28.7 (L) 36.0 - 46.0 %   MCV 76.1 (L) 80.0 - 100.0 fL   MCH 22.8 (L) 26.0 - 34.0 pg   MCHC 30.0 30.0 - 36.0 g/dL   RDW 16.5 (H) 11.5 - 15.5 %   Platelets 358 150 - 400 K/uL   nRBC 0.0 0.0 - 0.2 %    Comment: Performed at [facility], [place] [address], [place], [place] [address]  Protime-INR     Status: None   Collection Time: 11/28/20  3:09 AM  Result Value Ref Range   Prothrombin Time 14.7 11.4 - 15.2 seconds   INR 1.2 0.8 - 1.2    Comment: (NOTE) INR goal varies based on device and disease states. Performed at [facility], [place] [address], [place],  [address]   Vitamin B12     Status: None   Collection Time: 11/28/20  3:09 AM  Result Value Ref Range   Vitamin B-12 308 180 -  914 pg/mL    Comment: (NOTE) This assay is not validated for testing neonatal or myeloproliferative syndrome specimens for Vitamin B12 levels. Performed at [facility], [place] [address], [place], [place] [address]   Folate     Status: None   Collection Time: 11/28/20  3:09 AM  Result Value Ref Range   Folate 14.9 >5.9 ng/mL    Comment: Performed at [facility], [place] [address] [place], [place] [address]  Iron and TIBC     Status: Abnormal   Collection Time: 11/28/20  3:09 AM  Result Value Ref Range   Iron 12 (L) 28 - 170 ug/dL   TIBC 310 250 - 450 ug/dL   Saturation Ratios 4 (L) 10.4 - 31.8 %   UIBC 298 ug/dL    Comment: Performed at [facility], [place] [address], [place], [place] [address]  Ferritin     Status: None   Collection Time: 11/28/20  3:09 AM  Result Value Ref Range   Ferritin 20 11 - 307 ng/mL    Comment: Performed at [facility], [place] [address] [place], [place] [address]  Reticulocytes     Status: Abnormal   Collection Time: 11/28/20  3:09 AM  Result Value Ref Range   Retic Ct Pct 0.9 0.4 - 3.1 %   RBC. 3.70 (L) 3.87 - 5.11 MIL/uL   Retic Count, Absolute 32.9 19.0 - 186.0 K/uL   Immature Retic Fract 17.8 (H) 2.3 - 15.9 %    Comment: Performed at [facility], [place] [address], [place], [place] [address]    CT ABDOMEN PELVIS W CONTRAST  Result Date: 11/27/2020 CLINICAL DATA:  Abdominal pain that is began Monday. Patient reports seen scant amounts of dark blood in stool. Feels bloated. EXAM: CT ABDOMEN AND PELVIS WITH CONTRAST TECHNIQUE: Multidetector CT imaging of the abdomen and pelvis was performed using the standard protocol following bolus administration of intravenous contrast. CONTRAST:  1m OMNIPAQUE IOHEXOL 350 MG/ML SOLN COMPARISON:  None. FINDINGS: Lower chest: Small focal tree-in-bud nodularity within the anterior right lower lobe (series 4, image 24). Hepatobiliary: A 2.7 x 2.3 cm round hypodense lesion with peripheral interrupted nodular enhancement in the right lobe of the liver is consistent with a hemangioma (series 2,  image 15). A 0.5 cm oval hypodensity in the posterior right hepatic lobe is too small to characterize but likely represents a small cyst (series 2, image 25). Riedel's lobe. No gallstones, gallbladder wall thickening, or bile duct dilatation. Pancreas: Unremarkable. No pancreatic ductal dilatation or surrounding inflammatory changes. Spleen: Normal in size without focal abnormality. Adrenals/Urinary Tract: Adrenal glands are unremarkable. Kidneys are normal, without renal calculi, focal lesion, or hydronephrosis. Bladder is unremarkable. Stomach/Bowel: The stomach and small bowel are unremarkable. Normal appendix. Severe fecal burden throughout the colon. There is marked diffuse wall thickening of the mid and distal sigmoid colon and upper rectum with mucosal hyperenhancement and adjacent fat stranding. An irregular shaped 3.9 x 2.8 x 2.0 cm rim enhancing interloop fluid collection in the central posterior pelvis contains a small amount of fluid and air (series 2, image 68; series 6, image 88; series 5, images 95-98). Vascular/Lymphatic: No significant vascular findings are present. An enlarged 1 cm short axis left common iliac node is noted (series 2, image 60). A few other prominent, but not pathologically enlarged presacral nodes are noted. Reproductive: Hysterectomy.  Bilateral adnexa are unremarkable. Other: No free intraperitoneal air. Musculoskeletal: No acute or  significant osseous findings. IMPRESSION: 1. Proctocolitis involving the mid and distal sigmoid colon and upper rectum with a 3.9 cm gas containing interloop abscess centered in the sigmoid mesocolon. 2. Severe fecal burden.  No findings of bowel obstruction. 3. An enlarged left common iliac lymph node is likely reactive. 4. A 2.7 cm hemangioma is noted in the right hepatic lobe. 5. Mild focal bronchiolitis in the right lower lobe. These results were called by telephone at the time of interpretation on 11/27/2020 at 5:16 pm to provider GRACE LOEFFLER  , who verbally acknowledged these results. Electronically Signed   By: Ileana Roup M.D.   On: 11/27/2020 17:19    Review of Systems  Constitutional:  Positive for unexpected weight change. Negative for appetite change and fever.  HENT: Negative.    Eyes: Negative.   Respiratory: Negative.    Cardiovascular: Negative.   Gastrointestinal:  Positive for abdominal pain, blood in stool, constipation, diarrhea and nausea.  Endocrine: Negative.   Genitourinary: Negative.   Musculoskeletal: Negative.   Skin: Negative.   Allergic/Immunologic: Negative.   Neurological: Negative.   Hematological: Negative.   Psychiatric/Behavioral: Negative.     Physical Exam  Blood pressure 98/69, pulse 87, temperature 97.8 F (36.6 C), temperature source Oral, resp. rate 17, height 5' 6"  (1.676 m), weight 68 kg, last menstrual period 10/23/2016, SpO2 96 %.  CONSTITUTIONAL: no acute distress; conversant; no obvious deformities  EYES: conjunctiva moist; no lid lag; anicteric; pupils equal bilaterally  NECK: trachea midline; no thyroid nodularity  LUNGS: respiratory effort normal & unlabored; no wheeze; no rales  CV: rate and rhythm regular; no palpable thrills; no murmur; no edema bilat lower extremities  GI: abdomen is soft, mild distension; mild diffuse tenderness without mass; no hepatosplenomegaly; no obvious hernia  MSK: normal range of motion of extremities; no clubbing; no cyanosis  PSYCH: appropriate affect for situation; alert and oriented to person, place, & time  LYMPHATIC: no palpable cervical lymphadenopathy; no evidence lymphedema in extremities    Assessment/Plan:  Colitis of undetermined etiology involving the rectosigmoid colon Possible intra-abdominal abscess adjacent to distal sigmoid colon Lower GI bleeding of undetermined source   Agree with admission to medical service  Agree with empiric abx per medical service  Guiac all stools  Bowel regimen with  Miralax  Consult GI - consider flex sig exam with biopsies, cultures  Will discuss with IR - possible aspiration or drainage of pelvic abscess  This interesting case was discussed this morning with Dr. Malen Gauze from the medical service.  We will ask interventional radiology to review the CT scan and to address the possibility of aspiration or drainage of the pelvic abscess seen on that study.  I will discuss the case tomorrow morning with Dr. Neysa Bonito from colorectal surgery who will be following the patient this week for our practice.  Armandina Gemma, [facility] [place] [facility] Office: [phone]   Armandina Gemma 11/28/2020, 10:49 AM

## 2020-11-28 NOTE — Consult Note (Signed)
[facility] Consultation Note  Referring Provider: [facility] Primary Care Physician:  Pcp, No Primary Gastroenterologist:  Dr. Therisa Doyne  Reason for Consultation:  abdominal pain, weight loss, abnormal CT scan.  HPI: Barbara Valdez is a 45 y.o. female whom we've been asked to see for above reasons.  She began having troubles with constipation and abdominal pain about 6 months ago.  Tried having colonoscopy with Dr. Therisa Doyne, but unable to complete prep for the exam, and hasn't had repeat colonoscopy done (yet).  She has had constipation with straining and pellet-like stools and some blood on stool and tissue paper.  Over the past several weeks, she has lost about 20 lbs.   Past Medical History:  Diagnosis Date   Anemia    Anxiety    Complication of anesthesia    took a long time to wake up after surgery    Past Surgical History:  Procedure Laterality Date   ABDOMINAL HYSTERECTOMY Bilateral 09/26/2016   Procedure: HYSTERECTOMY ABDOMINAL W/ BILATERAL SALPINGECTOMY;  Surgeon: Emily Filbert, MD;  Location: [facility];  Service: Gynecology;  Laterality: Bilateral;   BREAST BIOPSY Left    TUBAL LIGATION      Prior to Admission medications   Medication Sig Start Date End Date Taking? Authorizing Provider  acetaminophen (TYLENOL) 500 MG tablet Take 1,000 mg by mouth every 6 (six) hours as needed for mild pain.   Yes [provider]  ibuprofen (ADVIL) 200 MG tablet Take 200 mg by mouth every 6 (six) hours as needed for mild pain.   Yes [provider]    Current Facility-Administered Medications  Medication Dose Route Frequency Provider Last Rate Last Admin   (feeding supplement) PROSource Plus liquid 30 mL  30 mL Oral BID BM Cherene Altes, MD   30 mL at 11/28/20 1314   acetaminophen (TYLENOL) tablet 650 mg  650 mg Oral Q6H PRN Cherene Altes, MD       cefTRIAXone (ROCEPHIN) 2 g in sodium chloride 0.9 % 100 mL IVPB  2 g Intravenous Q24H Cherene Altes, MD 200 mL/hr  at 11/28/20 0940 2 g at 11/28/20 0940   cyanocobalamin ((VITAMIN B-12)) injection 1,000 mcg  1,000 mcg Subcutaneous Daily Joette Catching T, MD   1,000 mcg at 11/28/20 0931   dextrose 5 % and 0.9 % NaCl with KCl 20 mEq/L infusion   Intravenous Continuous Cherene Altes, MD 75 mL/hr at 11/28/20 0807 New Bag at 11/28/20 0807   docusate sodium (COLACE) capsule 100 mg  100 mg Oral BID Joette Catching T, MD   100 mg at 11/28/20 0930   feeding supplement (BOOST / RESOURCE BREEZE) liquid 1 Container  1 Container Oral TID BM Cherene Altes, MD   1 Container at 11/28/20 1314   metroNIDAZOLE (FLAGYL) IVPB 500 mg  500 mg Intravenous Q12H Cherene Altes, MD 100 mL/hr at 11/28/20 0549 500 mg at 11/28/20 0549   multivitamin with minerals tablet 1 tablet  1 tablet Oral Daily Cherene Altes, MD   1 tablet at 11/28/20 0942   ondansetron (ZOFRAN) tablet 4 mg  4 mg Oral Q6H PRN Cherene Altes, MD       Or   ondansetron [facility]) injection 4 mg  4 mg Intravenous Q6H PRN Cherene Altes, MD   4 mg at 11/28/20 0800   oxyCODONE (Oxy IR/ROXICODONE) immediate release tablet 5-10 mg  5-10 mg Oral Q4H PRN Cherene Altes, MD   5 mg at 11/28/20 0600  polyethylene glycol (MIRALAX / GLYCOLAX) packet 17 g  17 g Oral BID Cherene Altes, MD       traMADol Veatrice Bourbon) tablet 50 mg  50 mg Oral Q6H PRN Cherene Altes, MD        Allergies as of 11/27/2020   (No Known Allergies)    Family History  Problem Relation Age of Onset   Hyperlipidemia Father    Hypertension Other    Breast cancer Neg Hx     Social History   Socioeconomic History   Marital status: Single    Spouse name: Not on file   Number of children: Not on file   Years of education: Not on file   Highest education level: Not on file  Occupational History   Not on file  Tobacco Use   Smoking status: Never   Smokeless tobacco: Never  Vaping Use   Vaping Use: Never used  Substance and Sexual Activity   Alcohol use: No    Drug use: No   Sexual activity: Yes    Birth control/protection: Surgical  Other Topics Concern   Not on file  Social History Narrative   Not on file   Social Determinants of Health   Financial Resource Strain: Not on file  Food Insecurity: Not on file  Transportation Needs: Not on file  Physical Activity: Not on file  Stress: Not on file  Social Connections: Not on file  Intimate Partner Violence: Not on file    Review of Systems: As per HPI, all others negative  Physical Exam: Vital signs in last 24 hours: Temp:  [97.8 F (36.6 C)-100.4 F (38 C)] 97.8 F (36.6 C) (11/06 1038) Pulse Rate:  [87-107] 87 (11/06 1038) Resp:  [14-20] 17 (11/06 1038) BP: (98-138)/(69-107) 98/69 (11/06 1038) SpO2:  [96 %-100 %] 96 % (11/06 1038) Weight:  [68 kg] 68 kg (11/05 1509) Last BM Date: 11/28/20 General:   Alert,  thin but not cachectic-appearing, pleasant and cooperative in NAD Head:  Normocephalic and atraumatic. Eyes:  Sclera clear, no icterus.   Conjunctiva pink. Ears:  Normal auditory acuity. Nose:  No deformity, discharge,  or lesions. Mouth:  No deformity or lesions.  Oropharynx pink & moist. Neck:  Supple; no masses or thyromegaly. Abdomen:  Soft, protuberant (mild), tenderness (mild), no peritonitis No masses, hepatosplenomegaly or hernias noted. Normal bowel sounds, without guarding, and without rebound.     Msk:  Symmetrical without gross deformities. Normal posture. Pulses:  Normal pulses noted. Extremities:  Without clubbing or edema. Neurologic:  Alert and  oriented x4;  grossly normal neurologically. Skin:  Intact without significant lesions or rashes. Psych:  Alert and cooperative. Normal mood and affect.   Lab Results: Recent Labs    11/27/20 1256 11/28/20 0309  WBC 10.2 9.2  HGB 9.6* 8.6*  HCT 31.5* 28.7*  PLT 379 358   BMET Recent Labs    11/27/20 1256 11/28/20 0309  NA 137 137  K 4.0 4.0  CL 106 107  CO2 23 22  GLUCOSE 95 107*  BUN 14 9   CREATININE 0.82 0.80  CALCIUM 9.0 8.5*   LFT Recent Labs    11/28/20 0309  PROT 7.2  ALBUMIN 3.1*  AST 12*  ALT 8  ALKPHOS 78  BILITOT 0.9   PT/INR Recent Labs    11/28/20 0309  LABPROT 14.7  INR 1.2    Studies/Results: CT ABDOMEN PELVIS W CONTRAST  Result Date: 11/27/2020 CLINICAL DATA:  Abdominal pain that is  began Monday. Patient reports seen scant amounts of dark blood in stool. Feels bloated. EXAM: CT ABDOMEN AND PELVIS WITH CONTRAST TECHNIQUE: Multidetector CT imaging of the abdomen and pelvis was performed using the standard protocol following bolus administration of intravenous contrast. CONTRAST:  46m OMNIPAQUE IOHEXOL 350 MG/ML SOLN COMPARISON:  None. FINDINGS: Lower chest: Small focal tree-in-bud nodularity within the anterior right lower lobe (series 4, image 24). Hepatobiliary: A 2.7 x 2.3 cm round hypodense lesion with peripheral interrupted nodular enhancement in the right lobe of the liver is consistent with a hemangioma (series 2, image 15). A 0.5 cm oval hypodensity in the posterior right hepatic lobe is too small to characterize but likely represents a small cyst (series 2, image 25). Riedel's lobe. No gallstones, gallbladder wall thickening, or bile duct dilatation. Pancreas: Unremarkable. No pancreatic ductal dilatation or surrounding inflammatory changes. Spleen: Normal in size without focal abnormality. Adrenals/Urinary Tract: Adrenal glands are unremarkable. Kidneys are normal, without renal calculi, focal lesion, or hydronephrosis. Bladder is unremarkable. Stomach/Bowel: The stomach and small bowel are unremarkable. Normal appendix. Severe fecal burden throughout the colon. There is marked diffuse wall thickening of the mid and distal sigmoid colon and upper rectum with mucosal hyperenhancement and adjacent fat stranding. An irregular shaped 3.9 x 2.8 x 2.0 cm rim enhancing interloop fluid collection in the central posterior pelvis contains a small amount of fluid  and air (series 2, image 68; series 6, image 88; series 5, images 95-98). Vascular/Lymphatic: No significant vascular findings are present. An enlarged 1 cm short axis left common iliac node is noted (series 2, image 60). A few other prominent, but not pathologically enlarged presacral nodes are noted. Reproductive: Hysterectomy.  Bilateral adnexa are unremarkable. Other: No free intraperitoneal air. Musculoskeletal: No acute or significant osseous findings. IMPRESSION: 1. Proctocolitis involving the mid and distal sigmoid colon and upper rectum with a 3.9 cm gas containing interloop abscess centered in the sigmoid mesocolon. 2. Severe fecal burden.  No findings of bowel obstruction. 3. An enlarged left common iliac lymph node is likely reactive. 4. A 2.7 cm hemangioma is noted in the right hepatic lobe. 5. Mild focal bronchiolitis in the right lower lobe. These results were called by telephone at the time of interpretation on 11/27/2020 at 5:16 pm to provider GRACE LOEFFLER , who verbally acknowledged these results. Electronically Signed   By: LIleana RoupM.D.   On: 11/27/2020 17:19    Impression:   Constipation.  Proctocolitis with abscess.  Weight loss.  Plan:   Appreciate surgical input, and await IR evaluation for possible drainage of abscess. Dulcolax suppositories. Sigmoidoscopy vs colonoscopy this admission might be required to facilitate diagnosis and management.  [facility] will follow.   LOS: 1 day   Legrand Lasser M  11/28/2020, 1:30 PM  Cell [phone] no answer or after 5 PM call [phone]

## 2020-11-28 NOTE — Plan of Care (Signed)
  Problem: Education: Goal: Knowledge of General Education information will improve Description: Including pain rating scale, medication(s)/side effects and non-pharmacologic comfort measures Outcome: Progressing   Problem: Health Behavior/Discharge Planning: Goal: Ability to manage health-related needs will improve Outcome: Progressing   Problem: Pain Managment: Goal: General experience of comfort will improve Outcome: Progressing

## 2020-11-28 NOTE — Progress Notes (Signed)
Barbara Valdez  IHK:[id number] DOB: 02/26/1975 DOA: 11/27/2020 PCP: Pcp, No    Brief Narrative:  45yo w/ a hx of an abdom hysterectomy for menorrhagia due to fibroids but no chronic medical problems who has been undergoing an outpatient evaluation for abdominal pain which has persisted for ~ 53month. [facility]. This w/u has included an attempted colonscopy which needed to be rescheduled due to insufficient bowel prep. She presented to the ED today due to 2 days of acute worsening of her abdom pain, with an inability to defecate over the same period of time. CT abdom/pelvis in the ED today noted proctocolitis involving the mid and distal sigmoid colon and upper rectum with a 3.9 cm gas containing interloop abscess centered in the sigmoid mesocolon, as well as a severe fecal burden w/o evidence of an obstruction. On HPI she relates a 20# unintentional weight loss over the last 9 months.   Consultants:  [facility]   Code Status: FULL CODE  Antimicrobials:  Rocephin 11/5 > Flagyl 11/5 >  DVT prophylaxis: SCDs  Subjective: No new complaints overnight.  Continues to pass only very small rounded bits of stool.  Abdominal pain without significant change.  No shortness of breath or chest pain.  Assessment & Plan:  Proctocolitis of sigmoid colon/upper rectum w/ 3.9cm abscess Continue empiric abx tx - GI and General Surgery to see today   GI Bleeding  Fecal occult + in ED -monitor hemoglobin trend -suspect this represents an indolent low-grade bleed and not an acute high-volume issue   Iron deficiency anemia Hgb 9.6 w/ MCV 77 at time of admit - pt s/p hysterectomy - a worrisome finding in this patient who is post-hysterectomy and therefore has no other source of expected blood loss - anemia panel confirms true iron deficiency -consider IV iron during this admission -more importantly the patient will absolutely require a detailed  work-up to identify the source of her anemia  Unintentional 20# weight loss Very concerning for GI malignancy in setting of above findings   Marginal B12 level Likely nutritional in etiology -supplement   Family Communication: No family present at time of exam today Status is: Inpatient   Objective: Blood pressure 113/76, pulse 95, temperature 98.6 F (37 C), temperature source Oral, resp. rate 20, height 5' 6"  (1.676 m), weight 68 kg, last menstrual period 10/23/2016, SpO2 97 %.  Intake/Output Summary (Last 24 hours) at 11/28/2020 0808 Last data filed at 11/28/2020 0600 Gross per 24 hour  Intake 946.23 ml  Output --  Net 946.23 ml   Filed Weights   11/27/20 1232 11/27/20 1509  Weight: 68 kg 68 kg    Examination: General: No acute respiratory distress Lungs: Clear to auscultation bilaterally without wheezes or crackles Cardiovascular: Regular rate and rhythm without murmur gallop or rub normal S1 and S2 Abdomen: Mildly tender to deep palpation diffusely but with no appreciable mass, bowel sounds positive, no rebound, soft Extremities: No significant cyanosis, clubbing, or edema bilateral lower extremities  CBC: Recent Labs  Lab 11/27/20 1256 11/28/20 0309  WBC 10.2 9.2  HGB 9.6* 8.6*  HCT 31.5* 28.7*  MCV 76.8* 76.1*  PLT 379 3756  Basic Metabolic Panel: Recent Labs  Lab 11/27/20 1256 11/28/20 0309  NA 137 137  K 4.0 4.0  CL 106 107  CO2 23 22  GLUCOSE 95 107*  BUN 14 9  CREATININE 0.82 0.80  CALCIUM 9.0  8.5*   GFR: Estimated Creatinine Clearance: 84 mL/min (by C-G formula based on SCr of 0.8 mg/dL).  Liver Function Tests: Recent Labs  Lab 11/27/20 1256 11/28/20 0309  AST 13* 12*  ALT 8 8  ALKPHOS 82 78  BILITOT 0.7 0.9  PROT 8.0 7.2  ALBUMIN 3.5 3.1*   Recent Labs  Lab 11/27/20 1256  LIPASE 19    Coagulation Profile: Recent Labs  Lab 11/28/20 0309  INR 1.2     Recent Results (from the past 240 hour(s))  Resp Panel by RT-PCR  (Flu A&B, Covid) Nasopharyngeal Swab     Status: None   Collection Time: 11/27/20  5:22 PM   Specimen: Nasopharyngeal Swab; Nasopharyngeal(NP) swabs in vial transport medium  Result Value Ref Range Status   SARS Coronavirus 2 by RT PCR NEGATIVE NEGATIVE Final    Comment: (NOTE) SARS-CoV-2 target nucleic acids are NOT DETECTED.  The SARS-CoV-2 RNA is generally detectable in upper respiratory specimens during the acute phase of infection. The lowest concentration of SARS-CoV-2 viral copies this assay can detect is 138 copies/mL. A negative result does not preclude SARS-Cov-2 infection and should not be used as the sole basis for treatment or other patient management decisions. A negative result may occur with  improper specimen collection/handling, submission of specimen other than nasopharyngeal swab, presence of viral mutation(s) within the areas targeted by this assay, and inadequate number of viral copies(<138 copies/mL). A negative result must be combined with clinical observations, patient history, and epidemiological information. The expected result is Negative.  Fact Sheet for Patients:  EntrepreneurPulse.com.au  Fact Sheet for Healthcare Providers:  IncredibleEmployment.be  This test is no t yet approved or cleared by the [place] FDA and  has been authorized for detection and/or diagnosis of SARS-CoV-2 by FDA under an Emergency Use Authorization (EUA). This EUA will remain  in effect (meaning this test can be used) for the duration of the COVID-19 declaration under Section 564(b)(1) of the Act, 21 U.S.C.section 360bbb-3(b)(1), unless the authorization is terminated  or revoked sooner.       Influenza A by PCR NEGATIVE NEGATIVE Final   Influenza B by PCR NEGATIVE NEGATIVE Final    Comment: (NOTE) The Xpert Xpress SARS-CoV-2/FLU/RSV plus assay is intended as an aid in the diagnosis of influenza from Nasopharyngeal swab specimens  and should not be used as a sole basis for treatment. Nasal washings and aspirates are unacceptable for Xpert Xpress SARS-CoV-2/FLU/RSV testing.  Fact Sheet for Patients: EntrepreneurPulse.com.au  Fact Sheet for Healthcare Providers: IncredibleEmployment.be  This test is not yet approved or cleared by the [place] FDA and has been authorized for detection and/or diagnosis of SARS-CoV-2 by FDA under an Emergency Use Authorization (EUA). This EUA will remain in effect (meaning this test can be used) for the duration of the COVID-19 declaration under Section 564(b)(1) of the Act, 21 U.S.C. section 360bbb-3(b)(1), unless the authorization is terminated or revoked.  Performed at [facility], [place] [address], [place], [place] [address]      Scheduled Meds:  docusate sodium  100 mg Oral BID   feeding supplement  1 Container Oral TID BM   Continuous Infusions:  cefTRIAXone (ROCEPHIN)  IV     dextrose 5 % and 0.9 % NaCl with KCl 20 mEq/L 75 mL/hr at 11/28/20 0807   metronidazole 500 mg (11/28/20 0549)     LOS: 1 day   Cherene Altes, MD [facility] Office  [phone] Pager - Text Page per  Amion  If 7PM-7AM, please contact night-coverage per Amion 11/28/2020, 8:08 AM

## 2020-11-28 NOTE — Progress Notes (Signed)
Initial Nutrition Assessment RD working remotely.  DOCUMENTATION CODES:   Not applicable  INTERVENTION:  - continue Boost Breeze TID, each supplement provides 250 kcal and 9 grams of protein. - will order 30 ml Prosource Plus BID, each supplement provides 100 kcal and 15 grams protein.  - will order 1 tablet multivitamin with minerals/day. - complete NFPE when feasible.    NUTRITION DIAGNOSIS:   Inadequate oral intake related to chronic illness as evidenced by per patient/family report.  GOAL:   Patient will meet greater than or equal to 90% of their needs  MONITOR:   PO intake, Supplement acceptance, Diet advancement, Labs, Weight trends  REASON FOR ASSESSMENT:   Malnutrition Screening Tool  ASSESSMENT:   45 year-old female with medical history of hysterectomy d/t fibroids. She has been undergoing outpatient evaluation for abdominal pain x6 months. She presented to the ED due to 2 day hx of acute worsening of abdominal pain and severe constipation. CT abdomen/pelvis in the ED showed proctocolitis, an abscess in the sigmoid mesocolon, and severe fecal burden without evidence of obstruction.  Patient has not been seen by a [place] RD at any time in the past.   Diet advanced from NPO to CLD yesterday at 1910. Boost Breeze ordered TID and she has accepted the 2 bottles of supplement offered to her so far.   Weight yesterday was documented as 150 lb, which appears to be a stated weight. Weight on 07/10/20 was 155 lb. Weight on 05/05/20 was 162 lb. This would indicate 12 lb weight loss (7.4% body weight) in the past 7 months; not significant for time frame.   Patient shares that she has lost 20 lb in the past 6-9 months d/t persistent abdominal pain leading to periods of significantly decreased intake/ability to eat.   Per notes: - proctocolitis of sigmoid and upper rectum with abscess--GI consult pending - GIB with fecal occult positive in the ED   Labs reviewed; Ca: 8.5  mg/dl. Medications reviewed; 1000 mcg subcutaneous cyanocobalamin/day 11/6-11/8, 100 mg colace BID. IVF; D5-NS-20 mEq IV KCl @ 75 ml/hr (306 kcal/24 hrs).    NUTRITION - FOCUSED PHYSICAL EXAM:  Unable to complete at this time.   Diet Order:   Diet Order             Diet clear liquid Room service appropriate? Yes; Fluid consistency: Thin  Diet effective now                   EDUCATION NEEDS:   Not appropriate for education at this time  Skin:  Skin Assessment: Reviewed RN Assessment  Last BM:  11/6 (type 5, small amount)  Height:   Ht Readings from Last 1 Encounters:  11/27/20 5' 6"  (1.676 m)    Weight:   Wt Readings from Last 1 Encounters:  11/27/20 68 kg    Estimated Nutritional Needs:  Kcal:  1800-2000 kcal Protein:  90-100 grams Fluid:  >/= 2.2 L/day      Jarome Matin, MS, RD, LDN, CNSC Inpatient Clinical Dietitian RD pager # available in AMION  After hours/weekend pager # available in [facility]

## 2020-11-29 LAB — CBC
HCT: 30.1 % — ABNORMAL LOW (ref 36.0–46.0)
Hemoglobin: 9.1 g/dL — ABNORMAL LOW (ref 12.0–15.0)
MCH: 23.3 pg — ABNORMAL LOW (ref 26.0–34.0)
MCHC: 30.2 g/dL (ref 30.0–36.0)
MCV: 77 fL — ABNORMAL LOW (ref 80.0–100.0)
Platelets: 361 10*3/uL (ref 150–400)
RBC: 3.91 MIL/uL (ref 3.87–5.11)
RDW: 16.7 % — ABNORMAL HIGH (ref 11.5–15.5)
WBC: 9.6 10*3/uL (ref 4.0–10.5)
nRBC: 0 % (ref 0.0–0.2)

## 2020-11-29 MED ORDER — PIPERACILLIN-TAZOBACTAM 3.375 G IVPB
3.3750 g | Freq: Three times a day (TID) | INTRAVENOUS | Status: DC
  Administered 2020-11-29 – 2020-12-01 (×7): 3.375 g via INTRAVENOUS
  Filled 2020-11-29 (×7): qty 50

## 2020-11-29 MED ORDER — BISACODYL 10 MG RE SUPP
10.0000 mg | Freq: Once | RECTAL | Status: AC
  Administered 2020-11-29: 10 mg via RECTAL
  Filled 2020-11-29: qty 1

## 2020-11-29 MED ORDER — SODIUM CHLORIDE 0.9 % IV SOLN
250.0000 mg | Freq: Once | INTRAVENOUS | Status: AC
  Administered 2020-11-29: 250 mg via INTRAVENOUS
  Filled 2020-11-29: qty 15

## 2020-11-29 MED ORDER — PEG 3350-KCL-NA BICARB-NACL 420 G PO SOLR
4000.0000 mL | Freq: Once | ORAL | Status: AC
  Administered 2020-11-29: 4000 mL via ORAL

## 2020-11-29 NOTE — Progress Notes (Signed)
Barbara Valdez  VQM:[id number] DOB: 1975/10/26 DOA: 11/27/2020 PCP: Pcp, No    Brief Narrative:  45yo w/ a hx of an abdom hysterectomy for menorrhagia due to fibroids but no chronic medical problems who has been undergoing an outpatient evaluation for abdominal pain which has persisted for ~ 43month. Her [facility]. This w/u has included an attempted colonscopy which needed to be rescheduled due to insufficient bowel prep. She presented to the ED today due to 2 days of acute worsening of her abdom pain, with an inability to defecate over the same period of time. CT abdom/pelvis in the ED today noted proctocolitis involving the mid and distal sigmoid colon and upper rectum with a 3.9 cm gas containing interloop abscess centered in the sigmoid mesocolon, as well as a severe fecal burden w/o evidence of an obstruction. On HPI she relates a 20# unintentional weight loss over the last 9 months.   Consultants:  [facility]   Code Status: FULL CODE  Antimicrobials:  Rocephin 11/5 > Flagyl 11/5 >  DVT prophylaxis: SCDs  Interval Hx: Afebrile.  Vital signs stable.  Hemoglobin holding steady.  In good spirits.  Tells me she is feeling better overall.  Has still not been able to move her bowels to a significant extent.  Denies chest pain or shortness of breath.  Some ongoing crampy generalized abdominal pain.  Assessment & Plan:  Proctocolitis of sigmoid colon/upper rectum w/ 3.9cm abscess Continue empiric abx tx - GI and General Surgery have evaluated -no surgical intervention felt to be indicated presently -IR does not feel the patient's abscess is in the location that is amenable to percutaneous drainage -GI does not feel colonoscopy is safe at present -continue antibiotic therapy -outpatient follow-up for eventual colonoscopy   GI Bleeding  Fecal occult + in ED - suspect this represents an indolent low-grade bleed and not an acute  high-volume issue -hemoglobin stable at present -for eventual outpatient colonoscopy   Iron deficiency anemia Hgb 9.6 w/ MCV 77 at time of admit - pt s/p hysterectomy - a worrisome finding in this patient who is post-hysterectomy and therefore has no other source of expected blood loss - anemia panel confirms true iron deficiency -consider IV iron during this admission -more importantly the patient will absolutely require a detailed work-up to identify the source of her anemia  Unintentional 20# weight loss Very concerning for GI malignancy in setting of above findings -to have colonoscopy as outpatient  Marginal B12 level Likely nutritional in etiology - supplementing   Family Communication: No family present at time of exam today Status is: Inpatient   Objective: Blood pressure 105/69, pulse 92, temperature 99.4 F (37.4 C), temperature source Oral, resp. rate 18, height 5' 6"  (1.676 m), weight 68 kg, last menstrual period 10/23/2016, SpO2 96 %.  Intake/Output Summary (Last 24 hours) at 11/29/2020 0834 Last data filed at 11/29/2020 0600 Gross per 24 hour  Intake 1627.75 ml  Output --  Net 1627.75 ml    Filed Weights   11/27/20 1232 11/27/20 1509  Weight: 68 kg 68 kg    Examination: General: No acute respiratory distress Lungs: Clear to auscultation bilaterally -no wheezing Cardiovascular: Regular rate and rhythm -no murmur or rub Abdomen: Mildly tender to deep palpation diffusely but with no appreciable mass, bowel sounds positive, no rebound, soft Extremities: No C/C/E B LE  CBC: Recent Labs  Lab 11/27/20 1256 11/28/20 0309 11/29/20  0410  WBC 10.2 9.2 9.6  HGB 9.6* 8.6* 9.1*  HCT 31.5* 28.7* 30.1*  MCV 76.8* 76.1* 77.0*  PLT 379 358 480    Basic Metabolic Panel: Recent Labs  Lab 11/27/20 1256 11/28/20 0309  NA 137 137  K 4.0 4.0  CL 106 107  CO2 23 22  GLUCOSE 95 107*  BUN 14 9  CREATININE 0.82 0.80  CALCIUM 9.0 8.5*    GFR: Estimated Creatinine  Clearance: 84 mL/min (by C-G formula based on SCr of 0.8 mg/dL).  Liver Function Tests: Recent Labs  Lab 11/27/20 1256 11/28/20 0309  AST 13* 12*  ALT 8 8  ALKPHOS 82 78  BILITOT 0.7 0.9  PROT 8.0 7.2  ALBUMIN 3.5 3.1*    Recent Labs  Lab 11/27/20 1256  LIPASE 19     Coagulation Profile: Recent Labs  Lab 11/28/20 0309  INR 1.2      Recent Results (from the past 240 hour(s))  Resp Panel by RT-PCR (Flu A&B, Covid) Nasopharyngeal Swab     Status: None   Collection Time: 11/27/20  5:22 PM   Specimen: Nasopharyngeal Swab; Nasopharyngeal(NP) swabs in vial transport medium  Result Value Ref Range Status   SARS Coronavirus 2 by RT PCR NEGATIVE NEGATIVE Final    Comment: (NOTE) SARS-CoV-2 target nucleic acids are NOT DETECTED.  The SARS-CoV-2 RNA is generally detectable in upper respiratory specimens during the acute phase of infection. The lowest concentration of SARS-CoV-2 viral copies this assay can detect is 138 copies/mL. A negative result does not preclude SARS-Cov-2 infection and should not be used as the sole basis for treatment or other patient management decisions. A negative result may occur with  improper specimen collection/handling, submission of specimen other than nasopharyngeal swab, presence of viral mutation(s) within the areas targeted by this assay, and inadequate number of viral copies(<138 copies/mL). A negative result must be combined with clinical observations, patient history, and epidemiological information. The expected result is Negative.  Fact Sheet for Patients:  EntrepreneurPulse.com.au  Fact Sheet for Healthcare Providers:  IncredibleEmployment.be  This test is no t yet approved or cleared by the [place] FDA and  has been authorized for detection and/or diagnosis of SARS-CoV-2 by FDA under an Emergency Use Authorization (EUA). This EUA will remain  in effect (meaning this test can be used)  for the duration of the COVID-19 declaration under Section 564(b)(1) of the Act, 21 U.S.C.section 360bbb-3(b)(1), unless the authorization is terminated  or revoked sooner.       Influenza A by PCR NEGATIVE NEGATIVE Final   Influenza B by PCR NEGATIVE NEGATIVE Final    Comment: (NOTE) The Xpert Xpress SARS-CoV-2/FLU/RSV plus assay is intended as an aid in the diagnosis of influenza from Nasopharyngeal swab specimens and should not be used as a sole basis for treatment. Nasal washings and aspirates are unacceptable for Xpert Xpress SARS-CoV-2/FLU/RSV testing.  Fact Sheet for Patients: EntrepreneurPulse.com.au  Fact Sheet for Healthcare Providers: IncredibleEmployment.be  This test is not yet approved or cleared by the [place] FDA and has been authorized for detection and/or diagnosis of SARS-CoV-2 by FDA under an Emergency Use Authorization (EUA). This EUA will remain in effect (meaning this test can be used) for the duration of the COVID-19 declaration under Section 564(b)(1) of the Act, 21 U.S.C. section 360bbb-3(b)(1), unless the authorization is terminated or revoked.  Performed at [facility], [place] [address] [place], [place] [address]       Scheduled Meds:  (  feeding supplement) PROSource Plus  30 mL Oral BID BM   cyanocobalamin  1,000 mcg Subcutaneous Daily   docusate sodium  100 mg Oral BID   feeding supplement  1 Container Oral TID BM   multivitamin with minerals  1 tablet Oral Daily   polyethylene glycol  17 g Oral BID   Continuous Infusions:  dextrose 5 % and 0.9 % NaCl with KCl 20 mEq/L 75 mL/hr at 11/28/20 2200   piperacillin-tazobactam (ZOSYN)  IV       LOS: 2 days   Cherene Altes, MD [facility] Office  [phone] Pager - Text Page per Shea Evans  If 7PM-7AM, please contact night-coverage per Amion 11/29/2020, 8:34 AM

## 2020-11-29 NOTE — Progress Notes (Signed)
Pharmacy Antibiotic Note  Barbara Valdez is a 45 y.o. female admitted on 11/27/2020 with IAI.  Pharmacy has been consulted for Zosyn dosing.  Plan: Zosyn 3.375g IV q8h (4 hour infusion).  Will sign off and follow remotely.   Height: 5' 6"  (167.6 cm) Weight: 68 kg (150 lb) IBW/kg (Calculated) : 59.3  Temp (24hrs), Avg:98.7 F (37.1 C), Min:97.8 F (36.6 C), Max:99.6 F (37.6 C)  Recent Labs  Lab 11/27/20 1256 11/28/20 0309 11/29/20 0410  WBC 10.2 9.2 9.6  CREATININE 0.82 0.80  --     Estimated Creatinine Clearance: 84 mL/min (by C-G formula based on SCr of 0.8 mg/dL).    No Known Allergies  Thank you for allowing pharmacy to be a part of this patient's care.  Ulice Dash D 11/29/2020 7:42 AM

## 2020-11-29 NOTE — Progress Notes (Signed)
[facility] Progress Note  Barbara Valdez 45 y.o. Jan 04, 1976  CC:  Proctocolitis with abscess   Subjective: Patient returning from bathroom. Continues to have difficulty with a bowel movement, yesterday had bowel movement with just mucus after Dulcolax, has urge to have have a bowel movement but unable to.  Will have dripping of bright red to dark red blood from rectum. Patient has right lower quadrant abdominal pain.  Patient states is currently controlled on Tylenol. Patient had vomiting once yesterday after pain pill, since that time has been doing broths, Ensure/boost without nausea or vomiting. Denies fever or chills   ROS : Review of Systems  Constitutional:  Negative for chills, fever and malaise/fatigue.  Respiratory:  Negative for shortness of breath.   Cardiovascular:  Negative for chest pain and leg swelling.  Gastrointestinal:  Positive for abdominal pain, blood in stool and constipation. Negative for diarrhea, heartburn, melena, nausea and vomiting (Had 1 episode after pain medication).  Musculoskeletal:  Negative for falls.    Objective: Vital signs in last 24 hours: Vitals:   11/28/20 2123 11/29/20 0529  BP: 114/69 105/69  Pulse: 87 92  Resp: 18 18  Temp: 99.6 F (37.6 C) 99.4 F (37.4 C)  SpO2: 100% 96%    Physical Exam: General:   Alert,  thin, pleasant and cooperative in NAD Abdomen:  Soft, protuberant (mild), tenderness right lower quadrant, without guarding or rebound. No masses, hepatosplenomegaly or hernias noted. Normal bowel sounds. Neurologic:  Alert and  oriented x4;  grossly normal neurologically. Psych:  Alert and cooperative. Normal mood and affect.  Lab Results: Recent Labs    11/27/20 1256 11/28/20 0309  NA 137 137  K 4.0 4.0  CL 106 107  CO2 23 22  GLUCOSE 95 107*  BUN 14 9  CREATININE 0.82 0.80  CALCIUM 9.0 8.5*   Recent Labs    11/27/20 1256 11/28/20 0309  AST 13* 12*  ALT 8 8  ALKPHOS 82 78  BILITOT 0.7 0.9   PROT 8.0 7.2  ALBUMIN 3.5 3.1*   Recent Labs    11/28/20 0309 11/29/20 0410  WBC 9.2 9.6  HGB 8.6* 9.1*  HCT 28.7* 30.1*  MCV 76.1* 77.0*  PLT 358 361   Recent Labs    11/28/20 0309  LABPROT 14.7  INR 1.2    Lab Results: Results for orders placed or performed during the hospital encounter of 11/27/20 (from the past 48 hour(s))  Lipase, blood     Status: None   Collection Time: 11/27/20 12:56 PM  Result Value Ref Range   Lipase 19 11 - 51 U/L    Comment: Performed at [facility], [place] [address] [place], [place] [address]  Comprehensive metabolic panel     Status: Abnormal   Collection Time: 11/27/20 12:56 PM  Result Value Ref Range   Sodium 137 135 - 145 mmol/L   Potassium 4.0 3.5 - 5.1 mmol/L   Chloride 106 98 - 111 mmol/L   CO2 23 22 - 32 mmol/L   Glucose, Bld 95 70 - 99 mg/dL    Comment: Glucose reference range applies only to samples taken after fasting for at least 8 hours.   BUN 14 6 - 20 mg/dL   Creatinine, Ser 0.82 0.44 - 1.00 mg/dL   Calcium 9.0 8.9 - 10.3 mg/dL   Total Protein 8.0 6.5 - 8.1 g/dL   Albumin 3.5 3.5 - 5.0 g/dL   AST 13 (L) 15 - 41 U/L  ALT 8 0 - 44 U/L   Alkaline Phosphatase 82 38 - 126 U/L   Total Bilirubin 0.7 0.3 - 1.2 mg/dL   GFR, Estimated >60 >60 mL/min    Comment: (NOTE) Calculated using the CKD-EPI Creatinine Equation (2021)    Anion gap 8 5 - 15    Comment: Performed at [facility], [place] [address], [place], [place] [address]  CBC     Status: Abnormal   Collection Time: 11/27/20 12:56 PM  Result Value Ref Range   WBC 10.2 4.0 - 10.5 K/uL   RBC 4.10 3.87 - 5.11 MIL/uL   Hemoglobin 9.6 (L) 12.0 - 15.0 g/dL   HCT 31.5 (L) 36.0 - 46.0 %   MCV 76.8 (L) 80.0 - 100.0 fL   MCH 23.4 (L) 26.0 - 34.0 pg   MCHC 30.5 30.0 - 36.0 g/dL   RDW 16.7 (H) 11.5 - 15.5 %   Platelets 379 150 - 400 K/uL   nRBC 0.0 0.0 - 0.2 %    Comment: Performed at [facility], [place] [address] [place], [place] [address]  Urinalysis, Routine w reflex microscopic     Status: Abnormal   Collection Time: 11/27/20  4:45 PM  Result Value Ref Range   Color, Urine YELLOW YELLOW   APPearance CLEAR CLEAR   Specific Gravity, Urine 1.020 1.005 - 1.030   pH 5.0 5.0 - 8.0   Glucose, UA NEGATIVE NEGATIVE mg/dL   Hgb urine dipstick NEGATIVE NEGATIVE   Bilirubin Urine NEGATIVE NEGATIVE   Ketones, ur 80 (A) NEGATIVE mg/dL   Protein, ur NEGATIVE NEGATIVE mg/dL   Nitrite NEGATIVE NEGATIVE   Leukocytes,Ua NEGATIVE NEGATIVE    Comment: Performed at [place] [address] [place], [place] [address]  POC occult blood, ED     Status: Abnormal   Collection Time: 11/27/20  4:52 PM  Result Value Ref Range   Fecal Occult Bld POSITIVE (A) NEGATIVE  Resp Panel by RT-PCR (Flu A&B, Covid) Nasopharyngeal Swab     Status: None   Collection Time: 11/27/20  5:22 PM   Specimen: Nasopharyngeal Swab; Nasopharyngeal(NP) swabs in vial transport medium  Result Value Ref Range   SARS Coronavirus 2 by RT PCR NEGATIVE NEGATIVE    Comment: (NOTE) SARS-CoV-2 target nucleic acids are NOT DETECTED.  The SARS-CoV-2 RNA is generally detectable in upper respiratory specimens during the acute phase of infection. The lowest concentration of SARS-CoV-2 viral copies this assay can detect is 138 copies/mL. A negative result does not preclude SARS-Cov-2 infection and should not be used as the sole basis for treatment or other patient management decisions. A negative result may occur with  improper specimen collection/handling, submission of specimen other than nasopharyngeal swab, presence of viral mutation(s) within the areas targeted by this assay, and inadequate number of viral copies(<138 copies/mL). A negative result must be combined with clinical observations, patient history, and epidemiological information. The expected result is Negative.  Fact Sheet for Patients:   EntrepreneurPulse.com.au  Fact Sheet for Healthcare Providers:  IncredibleEmployment.be  This test is no t yet approved or cleared by the [place] FDA and  has been authorized for detection and/or diagnosis of SARS-CoV-2 by FDA under an Emergency Use Authorization (EUA). This EUA will remain  in effect (meaning this test can be used) for the duration of the COVID-19 declaration under Section 564(b)(1) of the Act, 21 U.S.C.section 360bbb-3(b)(1), unless the authorization is terminated  or revoked sooner.  Influenza A by PCR NEGATIVE NEGATIVE   Influenza B by PCR NEGATIVE NEGATIVE    Comment: (NOTE) The Xpert Xpress SARS-CoV-2/FLU/RSV plus assay is intended as an aid in the diagnosis of influenza from Nasopharyngeal swab specimens and should not be used as a sole basis for treatment. Nasal washings and aspirates are unacceptable for Xpert Xpress SARS-CoV-2/FLU/RSV testing.  Fact Sheet for Patients: EntrepreneurPulse.com.au  Fact Sheet for Healthcare Providers: IncredibleEmployment.be  This test is not yet approved or cleared by the [place] FDA and has been authorized for detection and/or diagnosis of SARS-CoV-2 by FDA under an Emergency Use Authorization (EUA). This EUA will remain in effect (meaning this test can be used) for the duration of the COVID-19 declaration under Section 564(b)(1) of the Act, 21 U.S.C. section 360bbb-3(b)(1), unless the authorization is terminated or revoked.  Performed at [facility], [place] [address], [place], [place] [address]   Comprehensive metabolic panel     Status: Abnormal   Collection Time: 11/28/20  3:09 AM  Result Value Ref Range   Sodium 137 135 - 145 mmol/L   Potassium 4.0 3.5 - 5.1 mmol/L   Chloride 107 98 - 111 mmol/L   CO2 22 22 - 32 mmol/L   Glucose, Bld 107 (H) 70 - 99 mg/dL    Comment: Glucose reference range applies  only to samples taken after fasting for at least 8 hours.   BUN 9 6 - 20 mg/dL   Creatinine, Ser 0.80 0.44 - 1.00 mg/dL   Calcium 8.5 (L) 8.9 - 10.3 mg/dL   Total Protein 7.2 6.5 - 8.1 g/dL   Albumin 3.1 (L) 3.5 - 5.0 g/dL   AST 12 (L) 15 - 41 U/L   ALT 8 0 - 44 U/L   Alkaline Phosphatase 78 38 - 126 U/L   Total Bilirubin 0.9 0.3 - 1.2 mg/dL   GFR, Estimated >60 >60 mL/min    Comment: (NOTE) Calculated using the CKD-EPI Creatinine Equation (2021)    Anion gap 8 5 - 15    Comment: Performed at [facility], [place] [address], [place], [place] [address]  CBC     Status: Abnormal   Collection Time: 11/28/20  3:09 AM  Result Value Ref Range   WBC 9.2 4.0 - 10.5 K/uL   RBC 3.77 (L) 3.87 - 5.11 MIL/uL   Hemoglobin 8.6 (L) 12.0 - 15.0 g/dL    Comment: Reticulocyte Hemoglobin testing may be clinically indicated, consider ordering this additional test [id number]    HCT 28.7 (L) 36.0 - 46.0 %   MCV 76.1 (L) 80.0 - 100.0 fL   MCH 22.8 (L) 26.0 - 34.0 pg   MCHC 30.0 30.0 - 36.0 g/dL   RDW 16.5 (H) 11.5 - 15.5 %   Platelets 358 150 - 400 K/uL   nRBC 0.0 0.0 - 0.2 %    Comment: Performed at [facility], [place] [address], [place], [place] [address]  Protime-INR     Status: None   Collection Time: 11/28/20  3:09 AM  Result Value Ref Range   Prothrombin Time 14.7 11.4 - 15.2 seconds   INR 1.2 0.8 - 1.2    Comment: (NOTE) INR goal varies based on device and disease states. Performed at [facility], [place] [address], [place], [place] [address]   Vitamin B12     Status: None   Collection Time: 11/28/20  3:09 AM  Result Value Ref Range   Vitamin B-12 308  180 - 914 pg/mL    Comment: (NOTE) This assay is not validated for testing neonatal or myeloproliferative syndrome specimens for Vitamin B12 levels. Performed at [facility], [place] [address], [place], [place] [address]   Folate     Status: None   Collection Time:  11/28/20  3:09 AM  Result Value Ref Range   Folate 14.9 >5.9 ng/mL    Comment: Performed at [facility], [place] [address] [place], [place] [address]  Iron and TIBC     Status: Abnormal   Collection Time: 11/28/20  3:09 AM  Result Value Ref Range   Iron 12 (L) 28 - 170 ug/dL   TIBC 310 250 - 450 ug/dL   Saturation Ratios 4 (L) 10.4 - 31.8 %   UIBC 298 ug/dL    Comment: Performed at [facility], [place] [address], [place], [place] [address]  Ferritin     Status: None   Collection Time: 11/28/20  3:09 AM  Result Value Ref Range   Ferritin 20 11 - 307 ng/mL    Comment: Performed at [facility], [place] [address], [place], [place] [address]  Reticulocytes     Status: Abnormal   Collection Time: 11/28/20  3:09 AM  Result Value Ref Range   Retic Ct Pct 0.9 0.4 - 3.1 %   RBC. 3.70 (L) 3.87 - 5.11 MIL/uL   Retic Count, Absolute 32.9 19.0 - 186.0 K/uL   Immature Retic Fract 17.8 (H) 2.3 - 15.9 %    Comment: Performed at [facility], [place] [address], [place], [place] [address]  HIV Antibody (routine testing w rflx)     Status: None   Collection Time: 11/28/20  3:09 AM  Result Value Ref Range   HIV Screen 4th Generation wRfx Non Reactive Non Reactive    Comment: Performed at [facility], [place] [address] [place], [place] [address]  CBC     Status: Abnormal   Collection Time: 11/29/20  4:10 AM  Result Value Ref Range   WBC 9.6 4.0 - 10.5 K/uL   RBC 3.91 3.87 - 5.11 MIL/uL   Hemoglobin 9.1 (L) 12.0 - 15.0 g/dL   HCT 30.1 (L) 36.0 - 46.0 %   MCV 77.0 (L) 80.0 - 100.0 fL   MCH 23.3 (L) 26.0 - 34.0 pg   MCHC 30.2 30.0 - 36.0 g/dL   RDW 16.7 (H) 11.5 - 15.5 %   Platelets 361 150 - 400 K/uL   nRBC 0.0 0.0 - 0.2 %    Comment: Performed at [facility], [place] [address] [place], [place] [address]    Assessment/Plan: 1)Proctocolitis involving the rectosigmoid colon 2)Possible intra-abdominal  abscess adjacent to distal sigmoid colon 3) Microcytic Anemia  WBC 9.6 HGB 9.1 (8.6) MCV 77.0 Platelets 361 Iron 12 Ferritin 20 B12 308 4) Constipation  - General surgery has seen the patient - pending IR evaluation for possible drainage of abscess. -Continue zosyn - continue miralax, dulcolax and colace- will repeat dulcolax today.  -Likely patient would benefit from sigmoidoscopy or colonoscopy however due to abscess, at this time risks do not outweigh benefits.  Will suggest waiting 4 to 6 weeks for endoscopic evaluation. -Patient has previous failed colonoscopy due to prep, suggest 2-day prep prior to next colonoscopy. - Continue to monitor H&H with transfusion as needed to maintain hemoglobin greater than 7. - continue b12 injections    Vladimir Crofts PA-C 11/29/2020, 9:31  AM  Contact #  [phone]

## 2020-11-29 NOTE — Progress Notes (Signed)
Progress Note: General Surgery Service   Chief Complaint/Subjective: Continued lower abdominal discomfort and urge to defecate and urinate but only being able to urinate.    Has dealt with constipation issues since she was a teenager, have gotten worse recently.  Objective: Vital signs in last 24 hours: Temp:  [97.8 F (36.6 C)-99.6 F (37.6 C)] 99.4 F (37.4 C) (11/07 0529) Pulse Rate:  [87-92] 92 (11/07 0529) Resp:  [17-18] 18 (11/07 0529) BP: (98-114)/(66-69) 105/69 (11/07 0529) SpO2:  [96 %-100 %] 96 % (11/07 0529) Last BM Date: 11/28/20  Intake/Output from previous day: 11/06 0701 - 11/07 0700 In: 1867.8 [P.O.:840; I.V.:746; IV Piggyback:281.8] Out: -  Intake/Output this shift: Total I/O In: 240 [P.O.:240] Out: -   Constitutional: NAD; conversant; no deformities Eyes: Moist conjunctiva; no lid lag; anicteric; PERRL Neck: Trachea midline; no thyromegaly Lungs: Normal respiratory effort; no tactile fremitus CV: RRR; no palpable thrills; no pitting edema GI: Abd soft, mild lower abdominal tenderness; no palpable hepatosplenomegaly MSK: Normal range of motion of extremities; no clubbing/cyanosis Psychiatric: Appropriate affect; alert and oriented x3 Lymphatic: No palpable cervical or axillary lymphadenopathy  Lab Results: CBC  Recent Labs    11/28/20 0309 11/29/20 0410  WBC 9.2 9.6  HGB 8.6* 9.1*  HCT 28.7* 30.1*  PLT 358 361   BMET Recent Labs    11/27/20 1256 11/28/20 0309  NA 137 137  K 4.0 4.0  CL 106 107  CO2 23 22  GLUCOSE 95 107*  BUN 14 9  CREATININE 0.82 0.80  CALCIUM 9.0 8.5*   PT/INR Recent Labs    11/28/20 0309  LABPROT 14.7  INR 1.2   ABG No results for input(s): PHART, HCO3 in the last 72 hours.  Invalid input(s): PCO2, PO2  Anti-infectives: Anti-infectives (From admission, onward)    Start     Dose/Rate Route Frequency Ordered Stop   11/29/20 0830  piperacillin-tazobactam (ZOSYN) IVPB 3.375 g        3.375 g 12.5 mL/hr  over 240 Minutes Intravenous Every 8 hours 11/29/20 0742     11/28/20 1000  cefTRIAXone (ROCEPHIN) 2 g in sodium chloride 0.9 % 100 mL IVPB  Status:  Discontinued        2 g 200 mL/hr over 30 Minutes Intravenous Every 24 hours 11/27/20 1913 11/29/20 0740   11/28/20 0600  metroNIDAZOLE (FLAGYL) IVPB 500 mg  Status:  Discontinued       See Hyperspace for full Linked Orders Report.   500 mg 100 mL/hr over 60 Minutes Intravenous Every 12 hours 11/27/20 1914 11/29/20 0740   11/27/20 1915  cefTRIAXone (ROCEPHIN) 2 g in sodium chloride 0.9 % 100 mL IVPB  Status:  Discontinued       See Hyperspace for full Linked Orders Report.   2 g 200 mL/hr over 30 Minutes Intravenous  Once 11/27/20 1914 11/27/20 1916   11/27/20 1730  cefTRIAXone (ROCEPHIN) 2 g in sodium chloride 0.9 % 100 mL IVPB       See Hyperspace for full Linked Orders Report.   2 g 200 mL/hr over 30 Minutes Intravenous  Once 11/27/20 1721 11/27/20 1810   11/27/20 1730  metroNIDAZOLE (FLAGYL) IVPB 500 mg       See Hyperspace for full Linked Orders Report.   500 mg 100 mL/hr over 60 Minutes Intravenous  Once 11/27/20 1721 11/27/20 1944       Medications: Scheduled Meds:  (feeding supplement) PROSource Plus  30 mL Oral BID BM   bisacodyl  10 mg Rectal Once   cyanocobalamin  1,000 mcg Subcutaneous Daily   docusate sodium  100 mg Oral BID   feeding supplement  1 Container Oral TID BM   multivitamin with minerals  1 tablet Oral Daily   polyethylene glycol  17 g Oral BID   Continuous Infusions:  dextrose 5 % and 0.9 % NaCl with KCl 20 mEq/L 75 mL/hr at 11/28/20 2200   piperacillin-tazobactam (ZOSYN)  IV 3.375 g (11/29/20 0849)   PRN Meds:.acetaminophen, ondansetron **OR** ondansetron (ZOFRAN) IV, oxyCODONE, traMADol  Assessment/Plan: Barbara Valdez is a 45 year old female who presented on 11/27/20, with abdominal pain, requiring surgery consult on 11/28/20, for colitis with intra-abdominal abscess.  Colitis with intra-abdominal  abscess - Team discussed with IR over the weekend, does not appear amenable to IR drainage - GI consulted - Zosyn - No surgical indication right now, would benefit from continued antibiotics, colon cleanout, and colonoscopy for diagnosis   LOS: 2 days   Remainder of care per primary team   Felicie Morn, MD  [facility] Use [email or web address] to contact on call provider

## 2020-11-30 LAB — CBC
HCT: 27.8 % — ABNORMAL LOW (ref 36.0–46.0)
Hemoglobin: 8.3 g/dL — ABNORMAL LOW (ref 12.0–15.0)
MCH: 23.2 pg — ABNORMAL LOW (ref 26.0–34.0)
MCHC: 29.9 g/dL — ABNORMAL LOW (ref 30.0–36.0)
MCV: 77.7 fL — ABNORMAL LOW (ref 80.0–100.0)
Platelets: 335 10*3/uL (ref 150–400)
RBC: 3.58 MIL/uL — ABNORMAL LOW (ref 3.87–5.11)
RDW: 16.5 % — ABNORMAL HIGH (ref 11.5–15.5)
WBC: 8.7 10*3/uL (ref 4.0–10.5)
nRBC: 0 % (ref 0.0–0.2)

## 2020-11-30 LAB — COMPREHENSIVE METABOLIC PANEL
ALT: 8 U/L (ref 0–44)
AST: 12 U/L — ABNORMAL LOW (ref 15–41)
Albumin: 2.8 g/dL — ABNORMAL LOW (ref 3.5–5.0)
Alkaline Phosphatase: 66 U/L (ref 38–126)
Anion gap: 7 (ref 5–15)
BUN: 5 mg/dL — ABNORMAL LOW (ref 6–20)
CO2: 23 mmol/L (ref 22–32)
Calcium: 8.2 mg/dL — ABNORMAL LOW (ref 8.9–10.3)
Chloride: 108 mmol/L (ref 98–111)
Creatinine, Ser: 0.6 mg/dL (ref 0.44–1.00)
GFR, Estimated: >60 mL/min (ref >60)
Glucose, Bld: 137 mg/dL — ABNORMAL HIGH (ref 70–99)
Potassium: 3.5 mmol/L (ref 3.5–5.1)
Sodium: 138 mmol/L (ref 135–145)
Total Bilirubin: 0.2 mg/dL — ABNORMAL LOW (ref 0.3–1.2)
Total Protein: 6.7 g/dL (ref 6.5–8.1)

## 2020-11-30 NOTE — Progress Notes (Signed)
Patient ID: Barbara Valdez, female   DOB: [birth year], 45 y.o.   MRN: [id number] [facility] Surgery Progress Note     Subjective: CC-  Feeling rough this morning. She started drinking Golytely last night. Initially did ok then started having more lower abdominal discomfort. She had several loose/watery/ nonbloody stools. Not passing any flatus. Some nausea. Vomited once last night but it was during iron infusion and she thinks this was the culprit. WBC 8.7, VSS Hgb 8.3 from 9.1  Objective: Vital signs in last 24 hours: Temp:  [98.2 F (36.8 C)-98.6 F (37 C)] 98.6 F (37 C) (11/08 0650) Pulse Rate:  [78-91] 78 (11/08 0650) Resp:  [16-18] 18 (11/08 0650) BP: (103-115)/(73-80) 115/80 (11/08 0650) SpO2:  [95 %-100 %] 95 % (11/08 0650) Last BM Date: 11/30/20  Intake/Output from previous day: 11/07 0701 - 11/08 0700 In: 4338.6 [P.O.:1560; I.V.:2390.7; IV Piggyback:387.9] Out: 200 [Urine:200] Intake/Output this shift: Total I/O In: 120 [P.O.:120] Out: -   PE: Gen:  Alert, NAD, pleasant Pulm: rate and effort normal Abd: Soft, mild distension, mild lower abdominal TTP without rebound or guarding Psych: A&Ox3 Skin: no rashes noted, warm and dry  Lab Results:  Recent Labs    11/29/20 0410 11/30/20 0417  WBC 9.6 8.7  HGB 9.1* 8.3*  HCT 30.1* 27.8*  PLT 361 335   BMET Recent Labs    11/28/20 0309 11/30/20 0417  NA 137 138  K 4.0 3.5  CL 107 108  CO2 22 23  GLUCOSE 107* 137*  BUN 9 5*  CREATININE 0.80 0.60  CALCIUM 8.5* 8.2*   PT/INR Recent Labs    11/28/20 0309  LABPROT 14.7  INR 1.2   CMP     Component Value Date/Time   NA 138 11/30/2020 0417   K 3.5 11/30/2020 0417   CL 108 11/30/2020 0417   CO2 23 11/30/2020 0417   GLUCOSE 137 (H) 11/30/2020 0417   BUN 5 (L) 11/30/2020 0417   CREATININE 0.60 11/30/2020 0417   CALCIUM 8.2 (L) 11/30/2020 0417   PROT 6.7 11/30/2020 0417   ALBUMIN 2.8 (L) 11/30/2020 0417   AST 12 (L) 11/30/2020 0417   ALT 8  11/30/2020 0417   ALKPHOS 66 11/30/2020 0417   BILITOT 0.2 (L) 11/30/2020 0417   GFRNONAA >60 11/30/2020 0417   GFRAA >60 06/17/2016 1145   Lipase     Component Value Date/Time   LIPASE 19 11/27/2020 1256       Studies/Results: No results found.  Anti-infectives: Anti-infectives (From admission, onward)    Start     Dose/Rate Route Frequency Ordered Stop   11/29/20 0830  piperacillin-tazobactam (ZOSYN) IVPB 3.375 g        3.375 g 12.5 mL/hr over 240 Minutes Intravenous Every 8 hours 11/29/20 0742     11/28/20 1000  cefTRIAXone (ROCEPHIN) 2 g in sodium chloride 0.9 % 100 mL IVPB  Status:  Discontinued        2 g 200 mL/hr over 30 Minutes Intravenous Every 24 hours 11/27/20 1913 11/29/20 0740   11/28/20 0600  metroNIDAZOLE (FLAGYL) IVPB 500 mg  Status:  Discontinued       See Hyperspace for full Linked Orders Report.   500 mg 100 mL/hr over 60 Minutes Intravenous Every 12 hours 11/27/20 1914 11/29/20 0740   11/27/20 1915  cefTRIAXone (ROCEPHIN) 2 g in sodium chloride 0.9 % 100 mL IVPB  Status:  Discontinued       See Hyperspace for full Linked Orders Report.  2 g 200 mL/hr over 30 Minutes Intravenous  Once 11/27/20 1914 11/27/20 1916   11/27/20 1730  cefTRIAXone (ROCEPHIN) 2 g in sodium chloride 0.9 % 100 mL IVPB       See Hyperspace for full Linked Orders Report.   2 g 200 mL/hr over 30 Minutes Intravenous  Once 11/27/20 1721 11/27/20 1810   11/27/20 1730  metroNIDAZOLE (FLAGYL) IVPB 500 mg       See Hyperspace for full Linked Orders Report.   500 mg 100 mL/hr over 60 Minutes Intravenous  Once 11/27/20 1721 11/27/20 1944        Assessment/Plan Proctocolitis of sigmoid colon/upper rectum with possible 3.9cm abscess - imaging reviewed with IR over the weekend who felt she does not have an abscess. Recommend repeat imaging with contrast in a few days if not improving for reevaluation - continue IV antibiotics - GI has seen and does not plan for acute endoscopic  evaluation during admission unless there is a destabilizing bleed. Planning for outpatient colonoscopy - no indication for acute surgical intervention given stable hemoglobin and she is not obstructed. Will follow closely.  ID - rocephin/ flagyl 11/5>>11/7, zosyn 11/7>> FEN - IVF, CLD VTE - SCDs, per primary, ok for chemical dvt prophylaxis from surgical standpoint Foley - none  IDA Hx constipation   LOS: 3 days    Wellington Hampshire, [facility] Surgery 11/30/2020, 10:09 AM Please see Amion for pager number during day hours 7:00am-4:30pm

## 2020-11-30 NOTE — Progress Notes (Addendum)
Barbara Valdez  SFS:[id number] DOB: 12/22/1975 DOA: 11/27/2020 PCP: Pcp, No    Brief Narrative:  45yo w/ a hx of an abdom hysterectomy for menorrhagia due to fibroids but no chronic medical problems who has been undergoing an outpatient evaluation for abdominal pain which has persisted for ~ 1month. [facility]. This w/u has included an attempted colonscopy which needed to be rescheduled due to insufficient bowel prep. She presented to the ED today due to 2 days of acute worsening of her abdom pain, with an inability to defecate over the same period of time. CT abdom/pelvis in the ED today noted proctocolitis involving the mid and distal sigmoid colon and upper rectum with a 3.9 cm gas containing interloop abscess centered in the sigmoid mesocolon, as well as a severe fecal burden w/o evidence of an obstruction. On HPI she relates a 20# unintentional weight loss over the last 9 months.   Consultants:  [facility]   Code Status: FULL CODE  Antimicrobials:  Rocephin 11/5 > 11/6 Flagyl 11/5 > 11/6 Zosyn 11/7 >  DVT prophylaxis: SCDs  Interval Hx: Patient had multiple liquid bowel movements yesterday due to her golytley tx.  Afebrile.  Vital signs stable. Reports decreased abdom pain, but appetite remains quite poor. No chest pain. Some nausea but no vomiting.   Assessment & Plan:  Proctocolitis of sigmoid colon/upper rectum w/ 3.9cm abscess Continue empiric abx tx - GI and General Surgery have evaluated -no surgical intervention felt to be indicated presently -IR does not feel the patient's abscess is in a location that is amenable to percutaneous drainage - GI does not feel colonoscopy is safe at present -continue antibiotic therapy -outpatient follow-up for eventual colonoscopy - consider repeat CT abdom/pelvis as tx continues   Severe constipation - extensive stool burden Continuing goltyely tx per GI - for f/u KUB in AM -  this is a lifelong problem for her with a particularly severe acute exacerbation    GI Bleeding  Fecal occult + in ED - suspect this represents an indolent low-grade bleed and not an acute high-volume issue -hemoglobin stable at present -for eventual outpatient colonoscopy   Iron deficiency anemia Hgb 9.6 w/ MCV 77 at time of admit - pt s/p hysterectomy - a worrisome finding in this patient who is post-hysterectomy and therefore has no other source of expected blood loss - anemia panel confirms true iron deficiency - dosed w/ IV iron during this admission - the patient will require a detailed work-up to identify the source of her anemia as an outpatient, with colonoscopy as 1st step  Unintentional 20# weight loss Very concerning for GI malignancy in setting of above findings -to have colonoscopy as outpatient  Marginal B12 level Likely nutritional in etiology - supplementing   Family Communication: No family present at time of exam today Status is: Inpatient   Objective: Blood pressure 115/80, pulse 78, temperature 98.6 F (37 C), temperature source Oral, resp. rate 18, height 5' 6"  (1.676 m), weight 68 kg, last menstrual period 10/23/2016, SpO2 95 %.  Intake/Output Summary (Last 24 hours) at 11/30/2020 1020 Last data filed at 11/30/2020 0946 Gross per 24 hour  Intake 4218.6 ml  Output 200 ml  Net 4018.6 ml    Filed Weights   11/27/20 1232 11/27/20 1509  Weight: 68 kg 68 kg    Examination: General: No acute respiratory distress Lungs: Clear to auscultation bilaterally without wheezing  Cardiovascular: Regular rate and rhythm without murmur Abdomen: NT/ND, bowel sounds positive, no rebound, soft Extremities: No C/C/E B lower extremities  CBC: Recent Labs  Lab 11/28/20 0309 11/29/20 0410 11/30/20 0417  WBC 9.2 9.6 8.7  HGB 8.6* 9.1* 8.3*  HCT 28.7* 30.1* 27.8*  MCV 76.1* 77.0* 77.7*  PLT 358 361 355    Basic Metabolic Panel: Recent Labs  Lab 11/27/20 1256  11/28/20 0309 11/30/20 0417  NA 137 137 138  K 4.0 4.0 3.5  CL 106 107 108  CO2 23 22 23   GLUCOSE 95 107* 137*  BUN 14 9 5*  CREATININE 0.82 0.80 0.60  CALCIUM 9.0 8.5* 8.2*    GFR: Estimated Creatinine Clearance: 84 mL/min (by C-G formula based on SCr of 0.6 mg/dL).  Liver Function Tests: Recent Labs  Lab 11/27/20 1256 11/28/20 0309 11/30/20 0417  AST 13* 12* 12*  ALT 8 8 8   ALKPHOS 82 78 66  BILITOT 0.7 0.9 0.2*  PROT 8.0 7.2 6.7  ALBUMIN 3.5 3.1* 2.8*    Recent Labs  Lab 11/27/20 1256  LIPASE 19     Coagulation Profile: Recent Labs  Lab 11/28/20 0309  INR 1.2      Recent Results (from the past 240 hour(s))  Resp Panel by RT-PCR (Flu A&B, Covid) Nasopharyngeal Swab     Status: None   Collection Time: 11/27/20  5:22 PM   Specimen: Nasopharyngeal Swab; Nasopharyngeal(NP) swabs in vial transport medium  Result Value Ref Range Status   SARS Coronavirus 2 by RT PCR NEGATIVE NEGATIVE Final    Comment: (NOTE) SARS-CoV-2 target nucleic acids are NOT DETECTED.  The SARS-CoV-2 RNA is generally detectable in upper respiratory specimens during the acute phase of infection. The lowest concentration of SARS-CoV-2 viral copies this assay can detect is 138 copies/mL. A negative result does not preclude SARS-Cov-2 infection and should not be used as the sole basis for treatment or other patient management decisions. A negative result may occur with  improper specimen collection/handling, submission of specimen other than nasopharyngeal swab, presence of viral mutation(s) within the areas targeted by this assay, and inadequate number of viral copies(<138 copies/mL). A negative result must be combined with clinical observations, patient history, and epidemiological information. The expected result is Negative.  Fact Sheet for Patients:  EntrepreneurPulse.com.au  Fact Sheet for Healthcare Providers:   IncredibleEmployment.be  This test is no t yet approved or cleared by the [place] FDA and  has been authorized for detection and/or diagnosis of SARS-CoV-2 by FDA under an Emergency Use Authorization (EUA). This EUA will remain  in effect (meaning this test can be used) for the duration of the COVID-19 declaration under Section 564(b)(1) of the Act, 21 U.S.C.section 360bbb-3(b)(1), unless the authorization is terminated  or revoked sooner.       Influenza A by PCR NEGATIVE NEGATIVE Final   Influenza B by PCR NEGATIVE NEGATIVE Final    Comment: (NOTE) The Xpert Xpress SARS-CoV-2/FLU/RSV plus assay is intended as an aid in the diagnosis of influenza from Nasopharyngeal swab specimens and should not be used as a sole basis for treatment. Nasal washings and aspirates are unacceptable for Xpert Xpress SARS-CoV-2/FLU/RSV testing.  Fact Sheet for Patients: EntrepreneurPulse.com.au  Fact Sheet for Healthcare Providers: IncredibleEmployment.be  This test is not yet approved or cleared by the [place] FDA and has been authorized for detection and/or diagnosis of SARS-CoV-2 by FDA under an Emergency Use Authorization (EUA). This EUA will remain in effect (meaning this test  can be used) for the duration of the COVID-19 declaration under Section 564(b)(1) of the Act, 21 U.S.C. section 360bbb-3(b)(1), unless the authorization is terminated or revoked.  Performed at [facility], [place] [address], [place], [place] [address]       Scheduled Meds:  (feeding supplement) PROSource Plus  30 mL Oral BID BM   feeding supplement  1 Container Oral TID BM   multivitamin with minerals  1 tablet Oral Daily   Continuous Infusions:  dextrose 5 % and 0.9 % NaCl with KCl 20 mEq/L 75 mL/hr at 11/30/20 0301   piperacillin-tazobactam (ZOSYN)  IV 3.375 g (11/30/20 0857)     LOS: 3 days   Cherene Altes, MD [facility] Office  [phone] Pager - Text Page per Shea Evans  If 7PM-7AM, please contact night-coverage per Amion 11/30/2020, 10:20 AM

## 2020-11-30 NOTE — Progress Notes (Signed)
[facility] Progress Note  Barbara Valdez 45 y.o. 1975/09/18  CC:  Proctocolitis with abscess   Subjective: Patient lying comfortably in bed.  She is much improved since yesterday, without significant abdominal pain. She has had multiple bowel movement since starting on NuLYTELY and is now only complaining of some mild abdominal cramping.  Denies melena, hematochezia.  She had 1 episode of vomiting yesterday following iron transfusion, but denies other episodes of vomiting, nausea.  Denies fever or chills.  ROS : Review of Systems  Constitutional:  Negative for chills, fever and malaise/fatigue.  Respiratory:  Negative for shortness of breath.   Cardiovascular:  Negative for chest pain and leg swelling.  Gastrointestinal:  Positive for vomiting (Had 1 episode after iron transfusion). Negative for abdominal pain, blood in stool, constipation, diarrhea, heartburn, melena and nausea.       Mild abdominal cramping from laxative, pain improved from yesterday  Musculoskeletal:  Negative for falls.    Objective: Vital signs in last 24 hours: Vitals:   11/29/20 2103 11/30/20 0650  BP: 112/73 115/80  Pulse: 91 78  Resp: 18 18  Temp: 98.3 F (36.8 C) 98.6 F (37 C)  SpO2: 100% 95%    Physical Exam: General:   Alert,  thin, pleasant and cooperative in NAD Abdomen:  Soft, protuberant (mild), no tenderness, guarding or rebound. No masses, hepatosplenomegaly or hernias noted. Normal bowel sounds. Neurologic:  Alert and oriented x4;  grossly normal neurologically. Psych:  Alert and cooperative. Normal mood and affect.  Lab Results: Recent Labs    11/28/20 0309 11/30/20 0417  NA 137 138  K 4.0 3.5  CL 107 108  CO2 22 23  GLUCOSE 107* 137*  BUN 9 5*  CREATININE 0.80 0.60  CALCIUM 8.5* 8.2*    Recent Labs    11/28/20 0309 11/30/20 0417  AST 12* 12*  ALT 8 8  ALKPHOS 78 66  BILITOT 0.9 0.2*  PROT 7.2 6.7  ALBUMIN 3.1* 2.8*    Recent Labs    11/29/20 0410  11/30/20 0417  WBC 9.6 8.7  HGB 9.1* 8.3*  HCT 30.1* 27.8*  MCV 77.0* 77.7*  PLT 361 335    Recent Labs    11/28/20 0309  LABPROT 14.7  INR 1.2     Lab Results: Results for orders placed or performed during the hospital encounter of 11/27/20 (from the past 48 hour(s))  CBC     Status: Abnormal   Collection Time: 11/29/20  4:10 AM  Result Value Ref Range   WBC 9.6 4.0 - 10.5 K/uL   RBC 3.91 3.87 - 5.11 MIL/uL   Hemoglobin 9.1 (L) 12.0 - 15.0 g/dL   HCT 30.1 (L) 36.0 - 46.0 %   MCV 77.0 (L) 80.0 - 100.0 fL   MCH 23.3 (L) 26.0 - 34.0 pg   MCHC 30.2 30.0 - 36.0 g/dL   RDW 16.7 (H) 11.5 - 15.5 %   Platelets 361 150 - 400 K/uL   nRBC 0.0 0.0 - 0.2 %    Comment: Performed at [facility], [place] [address] [place], [place] [address]  CBC     Status: Abnormal   Collection Time: 11/30/20  4:17 AM  Result Value Ref Range   WBC 8.7 4.0 - 10.5 K/uL   RBC 3.58 (L) 3.87 - 5.11 MIL/uL   Hemoglobin 8.3 (L) 12.0 - 15.0 g/dL    Comment: Reticulocyte Hemoglobin testing may be clinically indicated, consider ordering this additional test [id number]  HCT 27.8 (L) 36.0 - 46.0 %   MCV 77.7 (L) 80.0 - 100.0 fL   MCH 23.2 (L) 26.0 - 34.0 pg   MCHC 29.9 (L) 30.0 - 36.0 g/dL   RDW 16.5 (H) 11.5 - 15.5 %   Platelets 335 150 - 400 K/uL   nRBC 0.0 0.0 - 0.2 %    Comment: Performed at [facility], [place] [address], [place], [place] [address]  Comprehensive metabolic panel     Status: Abnormal   Collection Time: 11/30/20  4:17 AM  Result Value Ref Range   Sodium 138 135 - 145 mmol/L   Potassium 3.5 3.5 - 5.1 mmol/L   Chloride 108 98 - 111 mmol/L   CO2 23 22 - 32 mmol/L   Glucose, Bld 137 (H) 70 - 99 mg/dL    Comment: Glucose reference range applies only to samples taken after fasting for at least 8 hours.   BUN 5 (L) 6 - 20 mg/dL   Creatinine, Ser 0.60 0.44 - 1.00 mg/dL   Calcium 8.2 (L) 8.9 - 10.3 mg/dL   Total Protein 6.7 6.5 - 8.1 g/dL   Albumin  2.8 (L) 3.5 - 5.0 g/dL   AST 12 (L) 15 - 41 U/L   ALT 8 0 - 44 U/L   Alkaline Phosphatase 66 38 - 126 U/L   Total Bilirubin 0.2 (L) 0.3 - 1.2 mg/dL   GFR, Estimated >60 >60 mL/min    Comment: (NOTE) Calculated using the CKD-EPI Creatinine Equation (2021)    Anion gap 7 5 - 15    Comment: Performed at [facility], [place] [address] [place], [place] [address]    Assessment/Plan: 1)Proctocolitis involving the rectosigmoid colon 2)Possible intra-abdominal abscess adjacent to distal sigmoid colon 3) Microcytic Anemia   WBC 8.7 (9.6) HGB 8.3 (9.1) MCV 77.7 Platelets 335 Iron 12 Ferritin 20 B12 308  4) Constipation  - General surgery has seen the patient - Team discussed with IR over the weekend, does not appear amenable to IR drainage - Continue Zosyn - Patient has had relief of constipation and multiple bowel movements since drinking NuLYTELY solution. Can discontinue. - Will start on MiraLAX twice daily and potentially add on Linzess. - Likely patient would benefit from sigmoidoscopy or colonoscopy, however due to abscess, at this time risks outweigh benefits.  Will suggest waiting 4 to 6 weeks for endoscopic evaluation. - Patient has previous failed colonoscopy due to prep, suggest 2-day prep prior to next colonoscopy. - Continue to monitor H&H with transfusion as needed to maintain hemoglobin greater than 7. - Continue B12 injections    Vladimir Crofts PA-C 11/30/2020, 11:57 AM  Contact #  [phone]

## 2020-12-01 ENCOUNTER — Inpatient Hospital Stay (HOSPITAL_COMMUNITY): Payer: Self-pay

## 2020-12-01 DIAGNOSIS — K625 Hemorrhage of anus and rectum: Secondary | ICD-10-CM

## 2020-12-01 DIAGNOSIS — K5900 Constipation, unspecified: Secondary | ICD-10-CM

## 2020-12-01 MED ORDER — METAMUCIL SMOOTH TEXTURE 58.6 % PO POWD: 1.0000 | Freq: Two times a day (BID) | ORAL | Status: DC

## 2020-12-01 MED ORDER — CIPROFLOXACIN HCL 500 MG PO TABS: 500.0000 mg | ORAL_TABLET | Freq: Two times a day (BID) | ORAL | 0 refills | Status: AC

## 2020-12-01 MED ORDER — METRONIDAZOLE 500 MG PO TABS: 500.0000 mg | ORAL_TABLET | Freq: Three times a day (TID) | ORAL | 0 refills | Status: AC

## 2020-12-01 MED ORDER — POLYETHYLENE GLYCOL 3350 17 G PO PACK: 17.0000 g | PACK | Freq: Two times a day (BID) | ORAL | Status: DC

## 2020-12-01 MED ORDER — TRAMADOL HCL 50 MG PO TABS: 50.0000 mg | ORAL_TABLET | Freq: Four times a day (QID) | ORAL | 0 refills | Status: AC | PRN

## 2020-12-01 NOTE — Discharge Instructions (Signed)
Barbara Valdez  You were in the hospital with an infection of your intestines. There was initially a concern for a possible abscess, although this is not 100% clear. You have improved with antibiotics and a bowel regimen to resolve your constipation. Please follow-up with the GI physician. Please return if you develop worsening fevers.

## 2020-12-01 NOTE — Discharge Summary (Signed)
Physician Discharge Summary  Barbara Valdez SNK:[id number] DOB: [birth year] DOA: 11/27/2020  PCP: Pcp, No  Admit date: 11/27/2020 Discharge date: 12/01/2020  Admitted From: Home Disposition: Home  Recommendations for Outpatient Follow-up:  Follow up with PCP in 1 week Follow up with GI in 2 weeks for repeat CT abdomen/pelvis and in 1 month for colonoscopy Please follow up on the following pending results: None  Home Health: None Equipment/Devices: None  Discharge Condition: Stable CODE STATUS: Full code Diet recommendation: Soft diet   Brief/Interim Summary:  Admission HPI written by Cherene Altes, MD  HPI:  [phone] w/ a hx of an abdom hysterectomy for menorrhagia due to fibroids but no chronic medical problems who has been undergoing an outpatient evaluation for abdominal pain which has persisted for ~ 26month. Her [facility]. This w/u has included an attempted colonscopy which needed to be rescheduled due to insufficient bowel prep. She presented to the ED today due to 2 days of acute worsening of her abdom pain, with an inability to defecate over the same period of time. CT abdom/pelvis in the ED today noted proctocolitis involving the mid and distal sigmoid colon and upper rectum with a 3.9 cm gas containing interloop abscess centered in the sigmoid mesocolon, as well as a severe fecal burden w/o evidence of an obstruction. On HPI she relates a 20# unintentional weight loss over the last 9 months.    Hospital course:  Proctocolitis of sigmoid/upper rectum Initial concern for 3.9 cm abscess. No blood cultures obtained. Empiric Ceftriaxone and metronidazole initiated. GI and general surgery consulted. General surgery recommended no surgical management and interventional radiology did not have clear window for percutaneous drainage. Ceftriaxone/Flagyl transitioned to Zossyn IV. During hospitalization, symptoms improved. GI  recommending patient to discharge on Ciprofloxacin and Flagyl for an additional 10 days, which is in addition to inpatient antibiotics. Recommendation for outpatient CT scan in weeks with GI.  Severe constipation GI consulted as mentioned above. Patient given a bowel regimen which included a bowel prep. Patient with good result. GI recommending discharge on Miralax and Metamucil.  GI bleeding Fecal occult blood test was positive and was thought secondary to low-grade bleed. Colonoscopy was considered but deferred secondary to active infection. Recommendation for outpatient colonoscopy on 12/30/2020. Hemoglobin stable  Iron deficiency anemia Unsure of etiology but may be related to above GI bleeding. IV iron infusion administered inpatient. Patient to follow-up with GI as an outpatient.  Unintentional weight loss 20 lb weight loss. Concerning for possible GI pathology. CT abdomen/pelvis without obvious malignant disease. Patient to follow-up with GI as an outpatient.  Discharge Diagnoses:  Principal Problem:   Colitis with rectal bleeding Active Problems:   Iron deficiency anemia   Constipation    Discharge Instructions  Discharge Instructions     Call MD for:  severe uncontrolled pain   Complete by: As directed    Call MD for:  temperature >100.4   Complete by: As directed    Increase activity slowly   Complete by: As directed       Allergies as of 12/01/2020   No Known Allergies      Medication List     TAKE these medications    acetaminophen 500 MG tablet Commonly known as: TYLENOL Take 1,000 mg by mouth every 6 (six) hours as needed for mild pain.   ciprofloxacin 500 MG tablet Commonly known as: Cipro Take 1 tablet (500 mg total)  by mouth 2 (two) times daily for 10 days.   ibuprofen 200 MG tablet Commonly known as: ADVIL Take 200 mg by mouth every 6 (six) hours as needed for mild pain.   Metamucil Smooth Texture 58.6 % powder Generic drug: psyllium Take 1  packet by mouth 2 (two) times daily.   metroNIDAZOLE 500 MG tablet Commonly known as: Flagyl Take 1 tablet (500 mg total) by mouth 3 (three) times daily for 10 days.   polyethylene glycol 17 g packet Commonly known as: MIRALAX / GLYCOLAX Take 17 g by mouth 2 (two) times daily.   traMADol 50 MG tablet Commonly known as: ULTRAM Take 1 tablet (50 mg total) by mouth every 6 (six) hours as needed for up to 3 days for moderate pain.        Follow-up Information     Ronnette Juniper, MD Follow up in 2 week(s).   Specialty: Gastroenterology Why: For hospital follow-up. Repeat CT (CAT) scan in 2-3 weeks. Colonoscopy on 12/30/2020. Contact information: [place] [place] [address] [phone]                No Known Allergies  Consultations: Gastroenterology General surgery   Procedures/Studies: CT ABDOMEN PELVIS W CONTRAST  Result Date: 11/27/2020 CLINICAL DATA:  Abdominal pain that is began Monday. Patient reports seen scant amounts of dark blood in stool. Feels bloated. EXAM: CT ABDOMEN AND PELVIS WITH CONTRAST TECHNIQUE: Multidetector CT imaging of the abdomen and pelvis was performed using the standard protocol following bolus administration of intravenous contrast. CONTRAST:  52m OMNIPAQUE IOHEXOL 350 MG/ML SOLN COMPARISON:  None. FINDINGS: Lower chest: Small focal tree-in-bud nodularity within the anterior right lower lobe (series 4, image 24). Hepatobiliary: A 2.7 x 2.3 cm round hypodense lesion with peripheral interrupted nodular enhancement in the right lobe of the liver is consistent with a hemangioma (series 2, image 15). A 0.5 cm oval hypodensity in the posterior right hepatic lobe is too small to characterize but likely represents a small cyst (series 2, image 25). Riedel's lobe. No gallstones, gallbladder wall thickening, or bile duct dilatation. Pancreas: Unremarkable. No pancreatic ductal dilatation or surrounding inflammatory changes. Spleen: Normal  in size without focal abnormality. Adrenals/Urinary Tract: Adrenal glands are unremarkable. Kidneys are normal, without renal calculi, focal lesion, or hydronephrosis. Bladder is unremarkable. Stomach/Bowel: The stomach and small bowel are unremarkable. Normal appendix. Severe fecal burden throughout the colon. There is marked diffuse wall thickening of the mid and distal sigmoid colon and upper rectum with mucosal hyperenhancement and adjacent fat stranding. An irregular shaped 3.9 x 2.8 x 2.0 cm rim enhancing interloop fluid collection in the central posterior pelvis contains a small amount of fluid and air (series 2, image 68; series 6, image 88; series 5, images 95-98). Vascular/Lymphatic: No significant vascular findings are present. An enlarged 1 cm short axis left common iliac node is noted (series 2, image 60). A few other prominent, but not pathologically enlarged presacral nodes are noted. Reproductive: Hysterectomy.  Bilateral adnexa are unremarkable. Other: No free intraperitoneal air. Musculoskeletal: No acute or significant osseous findings. IMPRESSION: 1. Proctocolitis involving the mid and distal sigmoid colon and upper rectum with a 3.9 cm gas containing interloop abscess centered in the sigmoid mesocolon. 2. Severe fecal burden.  No findings of bowel obstruction. 3. An enlarged left common iliac lymph node is likely reactive. 4. A 2.7 cm hemangioma is noted in the right hepatic lobe. 5. Mild focal bronchiolitis in the right lower lobe.  These results were called by telephone at the time of interpretation on 11/27/2020 at 5:16 pm to provider GRACE LOEFFLER , who verbally acknowledged these results. Electronically Signed   By: Ileana Roup M.D.   On: 11/27/2020 17:19   DG Abd Portable 1V  Result Date: 12/01/2020 CLINICAL DATA:  Constipation EXAM: PORTABLE ABDOMEN - 1 VIEW COMPARISON:  None. FINDINGS: Air is present throughout much of the colon with the exception of the distal sigmoid and rectum.  There is no significant stool burden. IMPRESSION: Paucity of gas in the distal sigmoid and rectum presumably related to inflammatory process on prior CT. No significant stool burden. Electronically Signed   By: Macy Mis M.D.   On: 12/01/2020 08:17      Subjective: No issues this morning. Pain is improved.  Discharge Exam: Vitals:   11/30/20 2039 12/01/20 0619  BP: 115/69 111/71  Pulse: 92 83  Resp: 18 18  Temp: 100.1 F (37.8 C) 98.7 F (37.1 C)  SpO2: 100% 94%   Vitals:   11/30/20 0650 11/30/20 1310 11/30/20 2039 12/01/20 0619  BP: 115/80 113/85 115/69 111/71  Pulse: 78 83 92 83  Resp: 18  18 18   Temp: 98.6 F (37 C) 98.1 F (36.7 C) 100.1 F (37.8 C) 98.7 F (37.1 C)  TempSrc: Oral  Oral Oral  SpO2: 95% 100% 100% 94%  Weight:      Height:        General: Pt is alert, awake, not in acute distress Cardiovascular: RRR, S1/S2 +, no rubs, no gallops Respiratory: CTA bilaterally, no wheezing, no rhonchi Abdominal: Soft, NT, ND, bowel sounds + Extremities: no edema, no cyanosis    The results of significant diagnostics from this hospitalization (including imaging, microbiology, ancillary and laboratory) are listed below for reference.     Microbiology: Recent Results (from the past 240 hour(s))  Resp Panel by RT-PCR (Flu A&B, Covid) Nasopharyngeal Swab     Status: None   Collection Time: 11/27/20  5:22 PM   Specimen: Nasopharyngeal Swab; Nasopharyngeal(NP) swabs in vial transport medium  Result Value Ref Range Status   SARS Coronavirus 2 by RT PCR NEGATIVE NEGATIVE Final    Comment: (NOTE) SARS-CoV-2 target nucleic acids are NOT DETECTED.  The SARS-CoV-2 RNA is generally detectable in upper respiratory specimens during the acute phase of infection. The lowest concentration of SARS-CoV-2 viral copies this assay can detect is 138 copies/mL. A negative result does not preclude SARS-Cov-2 infection and should not be used as the sole basis for treatment  or other patient management decisions. A negative result may occur with  improper specimen collection/handling, submission of specimen other than nasopharyngeal swab, presence of viral mutation(s) within the areas targeted by this assay, and inadequate number of viral copies(<138 copies/mL). A negative result must be combined with clinical observations, patient history, and epidemiological information. The expected result is Negative.  Fact Sheet for Patients:  EntrepreneurPulse.com.au  Fact Sheet for Healthcare Providers:  IncredibleEmployment.be  This test is no t yet approved or cleared by the [place] FDA and  has been authorized for detection and/or diagnosis of SARS-CoV-2 by FDA under an Emergency Use Authorization (EUA). This EUA will remain  in effect (meaning this test can be used) for the duration of the COVID-19 declaration under Section 564(b)(1) of the Act, 21 U.S.C.section 360bbb-3(b)(1), unless the authorization is terminated  or revoked sooner.       Influenza A by PCR NEGATIVE NEGATIVE Final   Influenza B by PCR NEGATIVE  NEGATIVE Final    Comment: (NOTE) The Xpert Xpress SARS-CoV-2/FLU/RSV plus assay is intended as an aid in the diagnosis of influenza from Nasopharyngeal swab specimens and should not be used as a sole basis for treatment. Nasal washings and aspirates are unacceptable for Xpert Xpress SARS-CoV-2/FLU/RSV testing.  Fact Sheet for Patients: EntrepreneurPulse.com.au  Fact Sheet for Healthcare Providers: IncredibleEmployment.be  This test is not yet approved or cleared by the [place] FDA and has been authorized for detection and/or diagnosis of SARS-CoV-2 by FDA under an Emergency Use Authorization (EUA). This EUA will remain in effect (meaning this test can be used) for the duration of the COVID-19 declaration under Section 564(b)(1) of the Act, 21 U.S.C. section  360bbb-3(b)(1), unless the authorization is terminated or revoked.  Performed at [facility], [place] [address] [place], [place] [address]      Labs: BNP (last 3 results) No results for input(s): BNP in the last 8760 hours. Basic Metabolic Panel: Recent Labs  Lab 11/27/20 1256 11/28/20 0309 11/30/20 0417  NA 137 137 138  K 4.0 4.0 3.5  CL 106 107 108  CO2 23 22 23   GLUCOSE 95 107* 137*  BUN 14 9 5*  CREATININE 0.82 0.80 0.60  CALCIUM 9.0 8.5* 8.2*   Liver Function Tests: Recent Labs  Lab 11/27/20 1256 11/28/20 0309 11/30/20 0417  AST 13* 12* 12*  ALT 8 8 8   ALKPHOS 82 78 66  BILITOT 0.7 0.9 0.2*  PROT 8.0 7.2 6.7  ALBUMIN 3.5 3.1* 2.8*   Recent Labs  Lab 11/27/20 1256  LIPASE 19   No results for input(s): AMMONIA in the last 168 hours. CBC: Recent Labs  Lab 11/27/20 1256 11/28/20 0309 11/29/20 0410 11/30/20 0417  WBC 10.2 9.2 9.6 8.7  HGB 9.6* 8.6* 9.1* 8.3*  HCT 31.5* 28.7* 30.1* 27.8*  MCV 76.8* 76.1* 77.0* 77.7*  PLT 379 358 361 335   Cardiac Enzymes: No results for input(s): CKTOTAL, CKMB, CKMBINDEX, TROPONINI in the last 168 hours. BNP: Invalid input(s): POCBNP CBG: No results for input(s): GLUCAP in the last 168 hours. D-Dimer No results for input(s): DDIMER in the last 72 hours. Hgb A1c No results for input(s): HGBA1C in the last 72 hours. Lipid Profile No results for input(s): CHOL, HDL, LDLCALC, TRIG, CHOLHDL, LDLDIRECT in the last 72 hours. Thyroid function studies No results for input(s): TSH, T4TOTAL, T3FREE, THYROIDAB in the last 72 hours.  Invalid input(s): FREET3 Anemia work up No results for input(s): VITAMINB12, FOLATE, FERRITIN, TIBC, IRON, RETICCTPCT in the last 72 hours. Urinalysis    Component Value Date/Time   COLORURINE YELLOW 11/27/2020 [place] 11/27/2020 1645   LABSPEC 1.020 11/27/2020 1645   PHURINE 5.0 11/27/2020 1645   GLUCOSEU NEGATIVE 11/27/2020 1645   HGBUR NEGATIVE  11/27/2020 1645   BILIRUBINUR NEGATIVE 11/27/2020 1645   KETONESUR 80 (A) 11/27/2020 1645   PROTEINUR NEGATIVE 11/27/2020 1645   UROBILINOGEN 0.2 09/27/2014 1718   NITRITE NEGATIVE 11/27/2020 1645   LEUKOCYTESUR NEGATIVE 11/27/2020 1645   Sepsis Labs Invalid input(s): PROCALCITONIN,  WBC,  LACTICIDVEN Microbiology Recent Results (from the past 240 hour(s))  Resp Panel by RT-PCR (Flu A&B, Covid) Nasopharyngeal Swab     Status: None   Collection Time: 11/27/20  5:22 PM   Specimen: Nasopharyngeal Swab; Nasopharyngeal(NP) swabs in vial transport medium  Result Value Ref Range Status   SARS Coronavirus 2 by RT PCR NEGATIVE NEGATIVE Final    Comment: (NOTE) SARS-CoV-2 target nucleic acids  are NOT DETECTED.  The SARS-CoV-2 RNA is generally detectable in upper respiratory specimens during the acute phase of infection. The lowest concentration of SARS-CoV-2 viral copies this assay can detect is 138 copies/mL. A negative result does not preclude SARS-Cov-2 infection and should not be used as the sole basis for treatment or other patient management decisions. A negative result may occur with  improper specimen collection/handling, submission of specimen other than nasopharyngeal swab, presence of viral mutation(s) within the areas targeted by this assay, and inadequate number of viral copies(<138 copies/mL). A negative result must be combined with clinical observations, patient history, and epidemiological information. The expected result is Negative.  Fact Sheet for Patients:  EntrepreneurPulse.com.au  Fact Sheet for Healthcare Providers:  IncredibleEmployment.be  This test is no t yet approved or cleared by the [place] FDA and  has been authorized for detection and/or diagnosis of SARS-CoV-2 by FDA under an Emergency Use Authorization (EUA). This EUA will remain  in effect (meaning this test can be used) for the duration of the COVID-19  declaration under Section 564(b)(1) of the Act, 21 U.S.C.section 360bbb-3(b)(1), unless the authorization is terminated  or revoked sooner.       Influenza A by PCR NEGATIVE NEGATIVE Final   Influenza B by PCR NEGATIVE NEGATIVE Final    Comment: (NOTE) The Xpert Xpress SARS-CoV-2/FLU/RSV plus assay is intended as an aid in the diagnosis of influenza from Nasopharyngeal swab specimens and should not be used as a sole basis for treatment. Nasal washings and aspirates are unacceptable for Xpert Xpress SARS-CoV-2/FLU/RSV testing.  Fact Sheet for Patients: EntrepreneurPulse.com.au  Fact Sheet for Healthcare Providers: IncredibleEmployment.be  This test is not yet approved or cleared by the [place] FDA and has been authorized for detection and/or diagnosis of SARS-CoV-2 by FDA under an Emergency Use Authorization (EUA). This EUA will remain in effect (meaning this test can be used) for the duration of the COVID-19 declaration under Section 564(b)(1) of the Act, 21 U.S.C. section 360bbb-3(b)(1), unless the authorization is terminated or revoked.  Performed at [facility], [place] [address] [place], [place] [address]      Time coordinating discharge: 35 minutes  SIGNED:   Cordelia Poche, MD [facility] 12/01/2020, 5:40 PM

## 2020-12-01 NOTE — Plan of Care (Signed)
Instructions were reviewed with patient. All questions were answered. Patient was transported to main entrance by wheelchair. ° °

## 2020-12-01 NOTE — Progress Notes (Signed)
Barbara Valdez [id number] 1975-06-24  CARE TEAM:  PCP: Pcp, No  Outpatient Care Team: Patient Care Team: Pcp, No as PCP - General  Inpatient Treatment Team: Treatment Team: Attending Provider: Mariel Aloe, MD; Consulting Physician: Nolon Nations, MD; Consulting Physician: Arta Silence, MD; Rounding Team: Fatima Blank, MD; Technician: Vidal Schwalbe, NT; Pharmacist: Napoleon Form, [facility]; Registered Nurse: Donia Ast, RN; Social Worker: Evans Lance; Utilization Review: Orlean Bradford, RN   Problem List:   Active Problems:   Colitis with abscess      * No surgery found *      Assessment  Stable  [facility] Stay = 4 days)  Plan:  -No evidence of abscess according to [facility] got it with interventional radiology, Dr. Harlow Asa, and myself.  Rather a soft call anyway.  Antibiotics per primary service.  This does not seem classic for diverticulitis.  Agree she needs colonoscopy at some point.  Defer to gastroenterology on timing.  I think they are trying to see if she can tolerate a bowel prep first.  There is no uncontrolled bleeding, obstruction, perforation, ischemia.  No hard indications for surgery.  Surgery will help follow peripherally.  -VTE prophylaxis- SCDs, etc  -mobilize as tolerated to help recovery  Disposition:  per primary service      20 minutes spent in review, evaluation, examination, counseling, and coordination of care.   I have reviewed this patient's available data, including medical history, events of note, physical examination and test results as part of my evaluation.  A significant portion of that time was spent in counseling.  Care during the described time interval was provided by me.  12/01/2020    Subjective: (Chief complaint)  Abdominal pain much less but still having some cramping while taking the GoLytely.  Having liquid stools.  No major bleeding.  Not quite thinned out yet.  Objective:  Vital  signs:  Vitals:   11/30/20 0650 11/30/20 1310 11/30/20 2039 12/01/20 0619  BP: 115/80 113/85 115/69 111/71  Pulse: 78 83 92 83  Resp: 18  18 18   Temp: 98.6 F (37 C) 98.1 F (36.7 C) 100.1 F (37.8 C) 98.7 F (37.1 C)  TempSrc: Oral  Oral Oral  SpO2: 95% 100% 100% 94%  Weight:      Height:        Last BM Date: 12/01/20  Intake/Output   Yesterday:  11/08 0701 - 11/09 0700 In: 2512 [P.O.:720; I.V.:1642.6; IV Piggyback:149.4] Out: -  This shift:  No intake/output data recorded.  Bowel function:  Flatus: YES  BM:  YES  Drain: (No drain)   Physical Exam:  General: Pt awake/alert in no acute distress Eyes: PERRL, normal EOM.  Sclera clear.  No icterus Neuro: CN II-XII intact w/o focal sensory/motor deficits. Lymph: No head/neck/groin lymphadenopathy Psych:  No delerium/psychosis/paranoia.  Oriented x 4 HENT: Normocephalic, Mucus membranes moist.  No thrush Neck: Supple, No tracheal deviation.  No obvious thyromegaly Chest: No pain to chest wall compression.  Good respiratory excursion.  No audible wheezing CV:  Pulses intact.  Regular rhythm.  No major extremity edema MS: Normal AROM mjr joints.  No obvious deformity  Abdomen: Soft.  Mildy distended.  Nontender.  No evidence of peritonitis.  No incarcerated hernias.  Ext:   No deformity.  No mjr edema.  No cyanosis Skin: No petechiae / purpurea.  No major sores.  Warm and dry    Results:   Cultures: Recent Results (from the past  720 hour(s))  Resp Panel by RT-PCR (Flu A&B, Covid) Nasopharyngeal Swab     Status: None   Collection Time: 11/27/20  5:22 PM   Specimen: Nasopharyngeal Swab; Nasopharyngeal(NP) swabs in vial transport medium  Result Value Ref Range Status   SARS Coronavirus 2 by RT PCR NEGATIVE NEGATIVE Final    Comment: (NOTE) SARS-CoV-2 target nucleic acids are NOT DETECTED.  The SARS-CoV-2 RNA is generally detectable in upper respiratory specimens during the acute phase of infection. The  lowest concentration of SARS-CoV-2 viral copies this assay can detect is 138 copies/mL. A negative result does not preclude SARS-Cov-2 infection and should not be used as the sole basis for treatment or other patient management decisions. A negative result may occur with  improper specimen collection/handling, submission of specimen other than nasopharyngeal swab, presence of viral mutation(s) within the areas targeted by this assay, and inadequate number of viral copies(<138 copies/mL). A negative result must be combined with clinical observations, patient history, and epidemiological information. The expected result is Negative.  Fact Sheet for Patients:  EntrepreneurPulse.com.au  Fact Sheet for Healthcare Providers:  IncredibleEmployment.be  This test is no t yet approved or cleared by the [place] FDA and  has been authorized for detection and/or diagnosis of SARS-CoV-2 by FDA under an Emergency Use Authorization (EUA). This EUA will remain  in effect (meaning this test can be used) for the duration of the COVID-19 declaration under Section 564(b)(1) of the Act, 21 U.S.C.section 360bbb-3(b)(1), unless the authorization is terminated  or revoked sooner.       Influenza A by PCR NEGATIVE NEGATIVE Final   Influenza B by PCR NEGATIVE NEGATIVE Final    Comment: (NOTE) The Xpert Xpress SARS-CoV-2/FLU/RSV plus assay is intended as an aid in the diagnosis of influenza from Nasopharyngeal swab specimens and should not be used as a sole basis for treatment. Nasal washings and aspirates are unacceptable for Xpert Xpress SARS-CoV-2/FLU/RSV testing.  Fact Sheet for Patients: EntrepreneurPulse.com.au  Fact Sheet for Healthcare Providers: IncredibleEmployment.be  This test is not yet approved or cleared by the [place] FDA and has been authorized for detection and/or diagnosis of SARS-CoV-2 by FDA under  an Emergency Use Authorization (EUA). This EUA will remain in effect (meaning this test can be used) for the duration of the COVID-19 declaration under Section 564(b)(1) of the Act, 21 U.S.C. section 360bbb-3(b)(1), unless the authorization is terminated or revoked.  Performed at [facility], [place] [address] [place], [place] [address]     Labs: Results for orders placed or performed during the hospital encounter of 11/27/20 (from the past 48 hour(s))  CBC     Status: Abnormal   Collection Time: 11/30/20  4:17 AM  Result Value Ref Range   WBC 8.7 4.0 - 10.5 K/uL   RBC 3.58 (L) 3.87 - 5.11 MIL/uL   Hemoglobin 8.3 (L) 12.0 - 15.0 g/dL    Comment: Reticulocyte Hemoglobin testing may be clinically indicated, consider ordering this additional test [id number]    HCT 27.8 (L) 36.0 - 46.0 %   MCV 77.7 (L) 80.0 - 100.0 fL   MCH 23.2 (L) 26.0 - 34.0 pg   MCHC 29.9 (L) 30.0 - 36.0 g/dL   RDW 16.5 (H) 11.5 - 15.5 %   Platelets 335 150 - 400 K/uL   nRBC 0.0 0.0 - 0.2 %    Comment: Performed at [facility], [place] [address], [place], [place] [address]  Comprehensive metabolic panel  Status: Abnormal   Collection Time: 11/30/20  4:17 AM  Result Value Ref Range   Sodium 138 135 - 145 mmol/L   Potassium 3.5 3.5 - 5.1 mmol/L   Chloride 108 98 - 111 mmol/L   CO2 23 22 - 32 mmol/L   Glucose, Bld 137 (H) 70 - 99 mg/dL    Comment: Glucose reference range applies only to samples taken after fasting for at least 8 hours.   BUN 5 (L) 6 - 20 mg/dL   Creatinine, Ser 0.60 0.44 - 1.00 mg/dL   Calcium 8.2 (L) 8.9 - 10.3 mg/dL   Total Protein 6.7 6.5 - 8.1 g/dL   Albumin 2.8 (L) 3.5 - 5.0 g/dL   AST 12 (L) 15 - 41 U/L   ALT 8 0 - 44 U/L   Alkaline Phosphatase 66 38 - 126 U/L   Total Bilirubin 0.2 (L) 0.3 - 1.2 mg/dL   GFR, Estimated >60 >60 mL/min    Comment: (NOTE) Calculated using the CKD-EPI Creatinine Equation (2021)    Anion gap 7 5 - 15    Comment:  Performed at [facility], [place] [address], [place], [place] [address]    Imaging / Studies: DG Abd Portable 1V  Result Date: 12/01/2020 CLINICAL DATA:  Constipation EXAM: PORTABLE ABDOMEN - 1 VIEW COMPARISON:  None. FINDINGS: Air is present throughout much of the colon with the exception of the distal sigmoid and rectum. There is no significant stool burden. IMPRESSION: Paucity of gas in the distal sigmoid and rectum presumably related to inflammatory process on prior CT. No significant stool burden. Electronically Signed   By: Macy Mis M.D.   On: 12/01/2020 08:17    Medications / Allergies: per chart  Antibiotics: Anti-infectives (From admission, onward)    Start     Dose/Rate Route Frequency Ordered Stop   11/29/20 0830  piperacillin-tazobactam (ZOSYN) IVPB 3.375 g        3.375 g 12.5 mL/hr over 240 Minutes Intravenous Every 8 hours 11/29/20 0742     11/28/20 1000  cefTRIAXone (ROCEPHIN) 2 g in sodium chloride 0.9 % 100 mL IVPB  Status:  Discontinued        2 g 200 mL/hr over 30 Minutes Intravenous Every 24 hours 11/27/20 1913 11/29/20 0740   11/28/20 0600  metroNIDAZOLE (FLAGYL) IVPB 500 mg  Status:  Discontinued       See Hyperspace for full Linked Orders Report.   500 mg 100 mL/hr over 60 Minutes Intravenous Every 12 hours 11/27/20 1914 11/29/20 0740   11/27/20 1915  cefTRIAXone (ROCEPHIN) 2 g in sodium chloride 0.9 % 100 mL IVPB  Status:  Discontinued       See Hyperspace for full Linked Orders Report.   2 g 200 mL/hr over 30 Minutes Intravenous  Once 11/27/20 1914 11/27/20 1916   11/27/20 1730  cefTRIAXone (ROCEPHIN) 2 g in sodium chloride 0.9 % 100 mL IVPB       See Hyperspace for full Linked Orders Report.   2 g 200 mL/hr over 30 Minutes Intravenous  Once 11/27/20 1721 11/27/20 1810   11/27/20 1730  metroNIDAZOLE (FLAGYL) IVPB 500 mg       See Hyperspace for full Linked Orders Report.   500 mg 100 mL/hr over 60 Minutes Intravenous  Once 11/27/20  1721 11/27/20 1944         Note: Portions of this report may have been transcribed using voice recognition software. Every effort was made to ensure accuracy;  however, inadvertent computerized transcription errors may be present.   Any transcriptional errors that result from this process are unintentional.    Adin Hector, MD, FACS, MASCRS Esophageal, Gastrointestinal & Colorectal Surgery Robotic and Minimally Invasive Surgery  [facility], [place]  [place]. [address] [place], [place] [address] [phone] Fax [phone] Main  CONTACT INFORMATION:  Weekday (9AM-5PM): Call [facility] main office at [phone]  Weeknight (5PM-9AM) or Weekend/Holiday: Check [email or web address] (password " [email or web address]") for General Surgery [facility] coverage  (Please, do not use SecureChat as it is not reliable communication to operating surgeons for immediate patient care)      12/01/2020  11:45 AM

## 2020-12-01 NOTE — Progress Notes (Signed)
[facility] Progress Note  Barbara Valdez 45 y.o. Feb 25, 1975  CC:  Proctocolitis with abscess   Subjective: Patient lying comfortably in bed. She has had multiple bowel movement since starting on NuLYTELY and some mild abdominal cramping. Denies abdominal pain but is having some bloating. Cramping stopped at 7am this morning.  Denies melena, hematochezia.  Denies nausea, vomiting.  Denies fever or chills.  ROS : Review of Systems  Constitutional:  Negative for chills, fever and malaise/fatigue.  Respiratory:  Negative for shortness of breath.   Cardiovascular:  Negative for chest pain and leg swelling.  Gastrointestinal:  Negative for abdominal pain, blood in stool, constipation, diarrhea, heartburn, melena, nausea and vomiting.       Mild abdominal cramping from laxative, improved from yesterday  Musculoskeletal:  Negative for falls.  Neurological:  Negative for loss of consciousness.  Psychiatric/Behavioral:  Negative for memory loss.     Objective: Vital signs in last 24 hours: Vitals:   11/30/20 2039 12/01/20 0619  BP: 115/69 111/71  Pulse: 92 83  Resp: 18 18  Temp: 100.1 F (37.8 C) 98.7 F (37.1 C)  SpO2: 100% 94%    Physical Exam: General:   Alert,  thin, pleasant and cooperative in NAD Abdomen:  Soft, protuberant (mild), no tenderness, guarding or rebound. No masses, hepatosplenomegaly or hernias noted. Normal bowel sounds. Neurologic:  Alert and oriented x4;  grossly normal neurologically. Psych:  Alert and cooperative. Normal mood and affect.  Lab Results: Recent Labs    11/30/20 0417  NA 138  K 3.5  CL 108  CO2 23  GLUCOSE 137*  BUN 5*  CREATININE 0.60  CALCIUM 8.2*    Recent Labs    11/30/20 0417  AST 12*  ALT 8  ALKPHOS 66  BILITOT 0.2*  PROT 6.7  ALBUMIN 2.8*    Recent Labs    11/29/20 0410 11/30/20 0417  WBC 9.6 8.7  HGB 9.1* 8.3*  HCT 30.1* 27.8*  MCV 77.0* 77.7*  PLT 361 335    No results for input(s): LABPROT, INR  in the last 72 hours.   Lab Results: Results for orders placed or performed during the hospital encounter of 11/27/20 (from the past 48 hour(s))  CBC     Status: Abnormal   Collection Time: 11/30/20  4:17 AM  Result Value Ref Range   WBC 8.7 4.0 - 10.5 K/uL   RBC 3.58 (L) 3.87 - 5.11 MIL/uL   Hemoglobin 8.3 (L) 12.0 - 15.0 g/dL    Comment: Reticulocyte Hemoglobin testing may be clinically indicated, consider ordering this additional test [id number]    HCT 27.8 (L) 36.0 - 46.0 %   MCV 77.7 (L) 80.0 - 100.0 fL   MCH 23.2 (L) 26.0 - 34.0 pg   MCHC 29.9 (L) 30.0 - 36.0 g/dL   RDW 16.5 (H) 11.5 - 15.5 %   Platelets 335 150 - 400 K/uL   nRBC 0.0 0.0 - 0.2 %    Comment: Performed at [facility], [place] [address] [place], [place] [address]  Comprehensive metabolic panel     Status: Abnormal   Collection Time: 11/30/20  4:17 AM  Result Value Ref Range   Sodium 138 135 - 145 mmol/L   Potassium 3.5 3.5 - 5.1 mmol/L   Chloride 108 98 - 111 mmol/L   CO2 23 22 - 32 mmol/L   Glucose, Bld 137 (H) 70 - 99 mg/dL    Comment: Glucose reference range applies only to samples taken  after fasting for at least 8 hours.   BUN 5 (L) 6 - 20 mg/dL   Creatinine, Ser 0.60 0.44 - 1.00 mg/dL   Calcium 8.2 (L) 8.9 - 10.3 mg/dL   Total Protein 6.7 6.5 - 8.1 g/dL   Albumin 2.8 (L) 3.5 - 5.0 g/dL   AST 12 (L) 15 - 41 U/L   ALT 8 0 - 44 U/L   Alkaline Phosphatase 66 38 - 126 U/L   Total Bilirubin 0.2 (L) 0.3 - 1.2 mg/dL   GFR, Estimated >60 >60 mL/min    Comment: (NOTE) Calculated using the CKD-EPI Creatinine Equation (2021)    Anion gap 7 5 - 15    Comment: Performed at [facility], [place] [address], [place], [place] [address]    Assessment: 1)Proctocolitis involving the rectosigmoid colon 2)Possible intra-abdominal abscess adjacent to distal sigmoid colon 3) Microcytic Anemia  4) Constipation  Plan: - General surgery has seen the patient, no amendable to IR  drainage.  - Patient has had relief of constipation and multiple bowel movements since drinking NuLYTELY solution without hematochezia, melena. - Resume MiraLAX BID and benefiber BID outpatient to prevent constipation. - Switch to Cipro and Flagyl outpatient for 10 days. - Repeat CT AB and pelvis in 2 weeks - Likely patient would benefit from sigmoidoscopy or colonoscopy, however due to abscess, at this time risks outweigh benefits.  Will suggest waiting 4 to 6 weeks for endoscopic evaluation.   [facility] will contact patient to schedule endoscopy. - Patient has previous failed colonoscopy due to poor prep, suggest 2-day prep prior to next colonoscopy. - Continue B12 injections   Vladimir Crofts PA-C 12/01/2020, 9:38 AM  Contact #  [phone]

## 2020-12-08 ENCOUNTER — Other Ambulatory Visit: Payer: Self-pay | Admitting: Gastroenterology

## 2020-12-08 DIAGNOSIS — K651 Peritoneal abscess: Secondary | ICD-10-CM

## 2021-01-23 DIAGNOSIS — C189 Malignant neoplasm of colon, unspecified: Secondary | ICD-10-CM

## 2021-01-23 HISTORY — DX: Malignant neoplasm of colon, unspecified: C18.9

## 2021-02-07 ENCOUNTER — Emergency Department (HOSPITAL_COMMUNITY): Payer: 59

## 2021-02-07 ENCOUNTER — Other Ambulatory Visit: Payer: Self-pay

## 2021-02-07 ENCOUNTER — Inpatient Hospital Stay (HOSPITAL_COMMUNITY)
Admission: EM | Admit: 2021-02-07 | Discharge: 2021-02-16 | DRG: 330 | Disposition: A | Discharge status: Home / Self Care | Payer: 59 | Attending: Internal Medicine | Admitting: Internal Medicine

## 2021-02-07 ENCOUNTER — Encounter (HOSPITAL_COMMUNITY): Payer: Self-pay

## 2021-02-07 DIAGNOSIS — K529 Noninfective gastroenteritis and colitis, unspecified: Secondary | ICD-10-CM | POA: Diagnosis present

## 2021-02-07 DIAGNOSIS — C187 Malignant neoplasm of sigmoid colon: Secondary | ICD-10-CM | POA: Diagnosis not present

## 2021-02-07 DIAGNOSIS — Z515 Encounter for palliative care: Secondary | ICD-10-CM

## 2021-02-07 DIAGNOSIS — K559 Vascular disorder of intestine, unspecified: Secondary | ICD-10-CM | POA: Diagnosis present

## 2021-02-07 DIAGNOSIS — E46 Unspecified protein-calorie malnutrition: Secondary | ICD-10-CM | POA: Diagnosis present

## 2021-02-07 DIAGNOSIS — F419 Anxiety disorder, unspecified: Secondary | ICD-10-CM | POA: Diagnosis present

## 2021-02-07 DIAGNOSIS — D509 Iron deficiency anemia, unspecified: Secondary | ICD-10-CM | POA: Diagnosis present

## 2021-02-07 DIAGNOSIS — Z433 Encounter for attention to colostomy: Secondary | ICD-10-CM

## 2021-02-07 DIAGNOSIS — R109 Unspecified abdominal pain: Secondary | ICD-10-CM | POA: Diagnosis not present

## 2021-02-07 DIAGNOSIS — Z90710 Acquired absence of both cervix and uterus: Secondary | ICD-10-CM

## 2021-02-07 DIAGNOSIS — Z888 Allergy status to other drugs, medicaments and biological substances status: Secondary | ICD-10-CM

## 2021-02-07 DIAGNOSIS — C179 Malignant neoplasm of small intestine, unspecified: Secondary | ICD-10-CM | POA: Diagnosis present

## 2021-02-07 DIAGNOSIS — Z539 Procedure and treatment not carried out, unspecified reason: Secondary | ICD-10-CM | POA: Diagnosis present

## 2021-02-07 DIAGNOSIS — Z8249 Family history of ischemic heart disease and other diseases of the circulatory system: Secondary | ICD-10-CM

## 2021-02-07 DIAGNOSIS — Z8719 Personal history of other diseases of the digestive system: Secondary | ICD-10-CM

## 2021-02-07 DIAGNOSIS — K56609 Unspecified intestinal obstruction, unspecified as to partial versus complete obstruction: Secondary | ICD-10-CM | POA: Diagnosis present

## 2021-02-07 DIAGNOSIS — Z79899 Other long term (current) drug therapy: Secondary | ICD-10-CM

## 2021-02-07 DIAGNOSIS — Z83438 Family history of other disorder of lipoprotein metabolism and other lipidemia: Secondary | ICD-10-CM

## 2021-02-07 DIAGNOSIS — E876 Hypokalemia: Secondary | ICD-10-CM | POA: Diagnosis not present

## 2021-02-07 DIAGNOSIS — Z20822 Contact with and (suspected) exposure to covid-19: Secondary | ICD-10-CM | POA: Diagnosis present

## 2021-02-07 DIAGNOSIS — K5190 Ulcerative colitis, unspecified, without complications: Secondary | ICD-10-CM | POA: Diagnosis present

## 2021-02-07 DIAGNOSIS — R1084 Generalized abdominal pain: Secondary | ICD-10-CM

## 2021-02-07 DIAGNOSIS — Z597 Insufficient social insurance and welfare support: Secondary | ICD-10-CM

## 2021-02-07 DIAGNOSIS — C189 Malignant neoplasm of colon, unspecified: Secondary | ICD-10-CM | POA: Diagnosis present

## 2021-02-07 LAB — URINALYSIS, ROUTINE W REFLEX MICROSCOPIC
Bilirubin Urine: NEGATIVE
Glucose, UA: NEGATIVE mg/dL
Hgb urine dipstick: NEGATIVE
Ketones, ur: 5 mg/dL — AB
Leukocytes,Ua: NEGATIVE
Nitrite: NEGATIVE
Protein, ur: NEGATIVE mg/dL
Specific Gravity, Urine: 1.044 — ABNORMAL HIGH (ref 1.005–1.030)
pH: 5 (ref 5.0–8.0)

## 2021-02-07 LAB — COMPREHENSIVE METABOLIC PANEL
ALT: 9 U/L (ref 0–44)
AST: 14 U/L — ABNORMAL LOW (ref 15–41)
Albumin: 3.9 g/dL (ref 3.5–5.0)
Alkaline Phosphatase: 61 U/L (ref 38–126)
Anion gap: 5 (ref 5–15)
BUN: 10 mg/dL (ref 6–20)
CO2: 26 mmol/L (ref 22–32)
Calcium: 9.2 mg/dL (ref 8.9–10.3)
Chloride: 106 mmol/L (ref 98–111)
Creatinine, Ser: 0.71 mg/dL (ref 0.44–1.00)
GFR, Estimated: >60 mL/min (ref >60)
Glucose, Bld: 109 mg/dL — ABNORMAL HIGH (ref 70–99)
Potassium: 3.8 mmol/L (ref 3.5–5.1)
Sodium: 137 mmol/L (ref 135–145)
Total Bilirubin: 0.6 mg/dL (ref 0.3–1.2)
Total Protein: 8.6 g/dL — ABNORMAL HIGH (ref 6.5–8.1)

## 2021-02-07 LAB — OCCULT BLOOD X 1 CARD TO LAB, STOOL: Fecal Occult Bld: NEGATIVE

## 2021-02-07 LAB — CBC
HCT: 38.3 % (ref 36.0–46.0)
Hemoglobin: 11.4 g/dL — ABNORMAL LOW (ref 12.0–15.0)
MCH: 23.6 pg — ABNORMAL LOW (ref 26.0–34.0)
MCHC: 29.8 g/dL — ABNORMAL LOW (ref 30.0–36.0)
MCV: 79.3 fL — ABNORMAL LOW (ref 80.0–100.0)
Platelets: 389 10*3/uL (ref 150–400)
RBC: 4.83 MIL/uL (ref 3.87–5.11)
RDW: 17.4 % — ABNORMAL HIGH (ref 11.5–15.5)
WBC: 7.5 10*3/uL (ref 4.0–10.5)
nRBC: 0 % (ref 0.0–0.2)

## 2021-02-07 LAB — LIPASE, BLOOD: Lipase: 24 U/L (ref 11–51)

## 2021-02-07 MED ORDER — SODIUM CHLORIDE 0.9% FLUSH
3.0000 mL | Freq: Two times a day (BID) | INTRAVENOUS | Status: DC
  Administered 2021-02-08 – 2021-02-16 (×10): 3 mL via INTRAVENOUS

## 2021-02-07 MED ORDER — KETOROLAC TROMETHAMINE 30 MG/ML IJ SOLN
30.0000 mg | Freq: Four times a day (QID) | INTRAMUSCULAR | Status: AC | PRN
  Administered 2021-02-07: 30 mg via INTRAVENOUS
  Filled 2021-02-07: qty 1

## 2021-02-07 MED ORDER — SODIUM CHLORIDE 0.9 % IV BOLUS
1000.0000 mL | Freq: Once | INTRAVENOUS | Status: AC
Start: 2021-02-07 — End: 2021-02-07
  Administered 2021-02-07: 1000 mL via INTRAVENOUS

## 2021-02-07 MED ORDER — SODIUM CHLORIDE (PF) 0.9 % IJ SOLN
INTRAMUSCULAR | Status: AC
  Filled 2021-02-07: qty 50

## 2021-02-07 MED ORDER — IOHEXOL 300 MG/ML  SOLN
100.0000 mL | Freq: Once | INTRAMUSCULAR | Status: AC | PRN
  Administered 2021-02-07: 100 mL via INTRAVENOUS

## 2021-02-07 MED ORDER — SODIUM CHLORIDE 0.9 % IV SOLN: INTRAVENOUS | Status: AC

## 2021-02-07 NOTE — ED Notes (Signed)
Also sent a pink top

## 2021-02-07 NOTE — ED Provider Notes (Signed)
[facility] DEPT Provider Note   CSN: [id number] Arrival date & time: 02/07/21  1119     History  Chief Complaint  Patient presents with   Blood In Stools   Abdominal Pain    Barbara Valdez is a 46 y.o. female who presents to the emergency department for recurrent abdominal pain for several months. Patient was admitted to the hospital for proctocolitis in November 2022 and states she has "not been right since". She states she is requiring stool softeners to have any bowel movements and has had worsening mucus and blood per rectum with small clots. She continues to pass gas. She had 5 loose stools yesterday and this morning. No fevers, nausea, vomiting, rectal pain, dysuria or hematuria. She denies any aggravating or alleviating factors.   She states she was supposed to have a colonoscopy with [facility] after her discharge in November.  Upon chart review patient was unable to have colonoscopy due to poor bowel prep.  This was supposed to be rescheduled, but patient did not have insurance.  She recently got insurance again, and is wanting a repeat referral if that is possible.   Abdominal Pain Associated symptoms: diarrhea   Associated symptoms: no chest pain, no chills, no constipation, no dysuria, no fever, no hematuria, no nausea, no shortness of breath and no vomiting      Past Medical History:  Diagnosis Date   Anemia    Anxiety    Complication of anesthesia    took a long time to wake up after surgery     Home Medications Prior to Admission medications   Medication Sig Start Date End Date Taking? Authorizing Provider  acetaminophen (TYLENOL) 500 MG tablet Take 1,000 mg by mouth every 6 (six) hours as needed for mild pain.    [provider]  ibuprofen (ADVIL) 200 MG tablet Take 200 mg by mouth every 6 (six) hours as needed for mild pain.    [provider]  polyethylene glycol (MIRALAX / GLYCOLAX) 17 g packet Take 17 g by  mouth 2 (two) times daily. 12/01/20   Mariel Aloe, MD  psyllium (METAMUCIL SMOOTH TEXTURE) 58.6 % powder Take 1 packet by mouth 2 (two) times daily. 12/01/20   Mariel Aloe, MD      Allergies    Patient has no known allergies.    Review of Systems   Review of Systems  Constitutional:  Negative for chills and fever.  Respiratory:  Negative for shortness of breath.   Cardiovascular:  Negative for chest pain.  Gastrointestinal:  Positive for abdominal pain, blood in stool and diarrhea. Negative for constipation, nausea and vomiting.  Genitourinary:  Negative for dysuria, flank pain and hematuria.  All other systems reviewed and are negative.  Physical Exam Updated Vital Signs BP 109/81    Pulse 95    Temp 98.7 F (37.1 C) (Oral)    Resp 20    Ht 5\' 6"  (1.676 m)    Wt 60.8 kg    LMP 10/23/2016    SpO2 100%    BMI 21.63 kg/m  Physical Exam Vitals and nursing note reviewed. Exam conducted with a chaperone present.  Constitutional:      Appearance: Normal appearance.  HENT:     Head: Normocephalic and atraumatic.  Eyes:     Conjunctiva/sclera: Conjunctivae normal.  Cardiovascular:     Rate and Rhythm: Normal rate and regular rhythm.  Pulmonary:     Effort: Pulmonary effort is normal.  No respiratory distress.     Breath sounds: Normal breath sounds.  Abdominal:     General: There is no distension.     Palpations: Abdomen is soft.     Tenderness: There is generalized abdominal tenderness. There is no right CVA tenderness, left CVA tenderness, guarding or rebound.  Genitourinary:    Rectum: Normal. Guaiac result negative. No mass or tenderness.  Skin:    General: Skin is warm and dry.  Neurological:     General: No focal deficit present.     Mental Status: She is alert.    ED Results / Procedures / Treatments   Labs (all labs ordered are listed, but only abnormal results are displayed) Labs Reviewed  COMPREHENSIVE METABOLIC PANEL - Abnormal; Notable for the following  components:      Result Value   Glucose, Bld 109 (*)    Total Protein 8.6 (*)    AST 14 (*)    All other components within normal limits  CBC - Abnormal; Notable for the following components:   Hemoglobin 11.4 (*)    MCV 79.3 (*)    MCH 23.6 (*)    MCHC 29.8 (*)    RDW 17.4 (*)    All other components within normal limits  LIPASE, BLOOD  OCCULT BLOOD X 1 CARD TO LAB, STOOL  URINALYSIS, ROUTINE W REFLEX MICROSCOPIC    EKG None  Radiology CT ABDOMEN PELVIS W CONTRAST  Result Date: 02/07/2021 CLINICAL DATA:  Abdominal pain, cramping, constipation EXAM: CT ABDOMEN AND PELVIS WITH CONTRAST TECHNIQUE: Multidetector CT imaging of the abdomen and pelvis was performed using the standard protocol following bolus administration of intravenous contrast. RADIATION DOSE REDUCTION: This exam was performed according to the departmental dose-optimization program which includes automated exposure control, adjustment of the mA and/or kV according to patient size and/or use of iterative reconstruction technique. CONTRAST:  171mL OMNIPAQUE IOHEXOL 300 MG/ML  SOLN COMPARISON:  11/27/2020 FINDINGS: Lower chest: No acute abnormality. Hepatobiliary: No solid liver abnormality is seen. 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 No gallstones, gallbladder wall thickening, or biliary dilatation. Pancreas: Unremarkable. No pancreatic ductal dilatation or surrounding inflammatory changes. Spleen: Normal in size without significant abnormality. Adrenals/Urinary Tract: Adrenal glands are unremarkable. Kidneys are normal, without renal calculi, solid lesion, or hydronephrosis. Bladder is unremarkable. Stomach/Bowel: Stomach is within normal limits. Appendix appears normal. Large burden of stool throughout the extremely redundant proximal colon. There is severe, circumferential wall thickening and mucosal hyperenhancement involving  the distal sigmoid colon and rectum to the anus, generally tethered appearance unchanged, involving at least 15-20 cm of distal sigmoid and rectum (series 2, image 65, series 4, image 60). There is again seen an abscess at the medial aspect of the rectosigmoid junction measuring approximately 3.0 x 1.6 cm (series 2, image 65). Vascular/Lymphatic: No significant vascular findings are present. Enlarged left external iliac lymph nodes measuring up to 1.1 x 0.9 cm (series 2, image 56) Reproductive: No mass or other significant abnormality. Other: No abdominal wall hernia or abnormality. No ascites. Musculoskeletal: No acute or significant osseous findings. IMPRESSION: 1. There is severe, circumferential wall thickening and mucosal hyperenhancement involving the distal sigmoid colon and rectum to the anus, generally tethered appearance unchanged, involving at least 15-20 cm of distal sigmoid and rectum. 2. There is again seen an abscess at the medial aspect of the rectosigmoid junction measuring approximately 3.0 x 1.6 cm. 3. Findings  are generally consistent with nonspecific infectious, inflammatory, or ischemic colitis, including inflammatory bowel disease such as Crohn's disease or ulcerative colitis; however an underlying malignancy is very difficult to exclude, particularly given substantial wall thickening. 4. Large burden of stool throughout the extremely redundant proximal colon, likely reflecting some degree of obstipation. 5. Enlarged left external iliac lymph nodes measuring up to 1.1 x 0.9 cm fall, nonspecific, possibly reactive, however nodal metastatic disease is not excluded per above. Electronically Signed   By: Delanna Ahmadi M.D.   On: 02/07/2021 15:31    Procedures Procedures    Medications Ordered in ED Medications  sodium chloride 0.9 % bolus 1,000 mL (has no administration in time range)  sodium chloride (PF) 0.9 % injection (has no administration in time range)  iohexol (OMNIPAQUE) 300  MG/ML solution 100 mL (100 mLs Intravenous Contrast Given 02/07/21 1507)    ED Course/ Medical Decision Making/ A&P                           Medical Decision Making This patient presents to the ED for concern of abdominal pain, this involves an extensive number of treatment options, and is a complaint that carries with it a high risk of complications and morbidity. The emergent differential diagnosis includes, but is not limited to,  The causes of generalized abdominal pain include but are not limited to AAA, mesenteric ischemia, appendicitis, diverticulitis, DKA, gastritis, gastroenteritis, AMI, nephrolithiasis, pancreatitis, peritonitis, adrenal insufficiency,lead poisoning, iron toxicity, intestinal ischemia, constipation, UTI,SBO/LBO, splenic rupture, biliary disease, IBD, IBS, PUD, or hepatitis, ectopic pregnancy, ovarian torsion, PID.  Co morbidities that complicate the patient evaluation: colitis with rectal bleeding  Additional history obtained from chart review. External records from outside source obtained and reviewed including discharge summary from November 2022. Patient was admitted at that time for IV antibiotics related to proctocolitis with abscess.   Physical Exam: Physical exam performed. The pertinent findings include: generalized abdominal tenderness without guarding.   Lab Tests: I Ordered, and personally interpreted labs.  The pertinent results include:  no leukocytosis, hemoglobin of 11.4 which is improved compared to prior, electrolytes within normal limits. Normal kidney function. Normal lipase. Hemoccult negative.    Imaging Studies: I ordered imaging studies including CT abdomen/pelvis that shows consistent severe wall thickening from distal sigmoid colon to anus with abscess 3.0 x 1.6cm abscess at the rectosigmoid colon. I agree with the radiologist interpretation.   Disposition as of 1530: Patient discussed and care transferred to oncoming provider Deatra Canter PA-C at  shift change. CT read at time of discharge and determined patient will require gastroenterology consult and medical admission for IV antibiotics and surgery consult.   Final Clinical Impression(s) / ED Diagnoses Final diagnoses:  Generalized abdominal pain  History of colitis    Rx / DC Orders ED Discharge Orders          Ordered    Ambulatory referral to Gastroenterology        02/07/21 1536           Portions of this report may have been transcribed using voice recognition software. Every effort was made to ensure accuracy; however, inadvertent computerized transcription errors may be present.    Estill Cotta 02/07/21 1547    Teressa Lower, MD 02/07/21 1555

## 2021-02-07 NOTE — ED Provider Notes (Signed)
Signout received on this 46 year old female who presents for persistent symptoms since her discharge in November of abdominal cramping, nausea, vomiting, loose stools, hematochezia.  At signout patient is awaiting CT abdomen pelvis with contrast. Physical Exam  BP 109/81    Pulse 95    Temp 98.7 F (37.1 C) (Oral)    Resp 20    Ht 5\' 6"  (1.676 m)    Wt 60.8 kg    LMP 10/23/2016    SpO2 100%    BMI 21.63 kg/m   Physical Exam Vitals and nursing note reviewed.  Constitutional:      General: She is not in acute distress.    Appearance: Normal appearance. She is not ill-appearing.  HENT:     Head: Normocephalic and atraumatic.     Nose: Nose normal.  Eyes:     Conjunctiva/sclera: Conjunctivae normal.  Pulmonary:     Effort: Pulmonary effort is normal. No respiratory distress.  Abdominal:     General: There is no distension.     Palpations: Abdomen is soft.     Tenderness: There is no abdominal tenderness. There is no guarding.  Musculoskeletal:        General: No deformity.  Skin:    Findings: No rash.  Neurological:     Mental Status: She is alert.    Procedures  Procedures  ED Course / MDM    Medical Decision Making  CT abdomen pelvis demonstrate patient has inflammatory changes as well as a fluid collection concerning for proctocolitis abscess.  Patient was discharged with last hospitalization with p.o. antibiotics which she did not complete because she was not able to tolerate these antibiotics.  Surgery at that time felt like this was not an abscess when they reviewed this along with VIR prior to discharge.  Discussed with surgery who continue to feel that this is consistent with the last CT scan and does not warrant surgical intervention or antibiotics.  They recommend GI work-up.  Discussion had with GI who recommend admission for inpatient colonoscopy given they have been unable to proceed with this outpatient.  Will discuss with hospitalist.  Spoke to hospitalist will  evaluate patient for admission.       Evlyn Courier, PA-C [id number]/50 [id number]    Gray, [place] P, DO 67/34/19 [id number]

## 2021-02-07 NOTE — ED Triage Notes (Addendum)
Patient reports that she has been taking stool softeners and feels like she has "trapped  gas." Patient c/o abdominal cramping. Patient states she takes stool softeners because she can not have a BM without taking them. Patient states she had 5 loose stools yesterday and this AM with bright red blood with small clots.

## 2021-02-07 NOTE — H&P (Signed)
History and Physical   Barbara Valdez AJG:[id number] DOB: [birth year] DOA: 02/07/2021  PCP: Pcp, No   Patient coming from: Home  Chief Complaint: Abdominal pain  HPI: Barbara Valdez is a 46 y.o. female with medical history significant of fibroids, iron deficiency anemia, colitis, anxiety who presents with recurrent abdominal pain.  Patient has ongoing issues with abdominal pain for the past several months.  She was admitted in November and states she has not felt right since then.  She was discharged on antibiotics but did not complete the course due to side effects.  She also was scheduled to have a colonoscopy following this admission however it got canceled due to a poor prep and then she was unable to get rescheduled due to issues with her insurance or lack thereof.  She states that she has been requiring stool softeners for her bowel movements and she is noticed increased mucus and blood per rectum at times.  She denies any nausea vomiting or rectal pain.  She denies fevers, chills, chest pain, shortness of breath.   ED Course: Vital signs in the ED significant for blood pressure in the 35D to 974B systolic, heart rate in the 90s to 100s.  Lab work-up showed CMP with protein of 8.6.  CBC with hemoglobin stable at 11.4.  FOBT negative.  Lipase normal.  Urinalysis normal.  CT of the abdomen pelvis showed severe wall thickening of distal colon with redemonstrated possible abscess at the rectosigmoid junction, consistent with infection versus inflammation versus ischemic colitis and they also state difficult to rule out malignancy due to the degree of wall thickening.  Also report large stool burden and and some lymphadenopathy.  Patient received a liter of fluids in ED.  General surgery was consulted given the possible abscess and they state this is unlikely be abscess and appears stable from previous CT in November.  No need for surgical intervention at this time.  GI consulted and based on  patient's history and need for colonoscopy that has been unable to obtain outpatient recommend patient being admitted and they will see the patient in consultation with plan for colonoscopy while admitted.  Review of Systems: As per HPI otherwise all other systems reviewed and are negative.  Past Medical History:  Diagnosis Date   Anemia    Anxiety    Complication of anesthesia    took a long time to wake up after surgery    Past Surgical History:  Procedure Laterality Date   ABDOMINAL HYSTERECTOMY Bilateral 09/26/2016   Procedure: HYSTERECTOMY ABDOMINAL W/ BILATERAL SALPINGECTOMY;  Surgeon: Emily Filbert, MD;  Location: [facility];  Service: Gynecology;  Laterality: Bilateral;   BREAST BIOPSY Left    TUBAL LIGATION      Social History  reports that she has never smoked. She has never used smokeless tobacco. She reports that she does not drink alcohol and does not use drugs.  No Known Allergies  Family History  Problem Relation Age of Onset   Hyperlipidemia Father    Hypertension Other    Breast cancer Neg Hx   Reviewed on admission  Prior to Admission medications   Medication Sig Start Date End Date Taking? Authorizing Provider  acetaminophen (TYLENOL) 500 MG tablet Take 1,000 mg by mouth every 6 (six) hours as needed for mild pain.    [provider]  ibuprofen (ADVIL) 200 MG tablet Take 200 mg by mouth every 6 (six) hours as needed for mild pain.    [provider]  polyethylene glycol (MIRALAX / GLYCOLAX) 17 g packet Take 17 g by mouth 2 (two) times daily. 12/01/20   Mariel Aloe, MD  psyllium (METAMUCIL SMOOTH TEXTURE) 58.6 % powder Take 1 packet by mouth 2 (two) times daily. 12/01/20   Mariel Aloe, MD    Physical Exam: Vitals:   02/07/21 1126 02/07/21 1142 02/07/21 1350 02/07/21 1708  BP: 112/77  109/81 107/85  Pulse: 85  95 96  Resp: 16  20 18   Temp: 98.7 F (37.1 C)     TempSrc: Oral     SpO2: 100%  100% 100%  Weight:  60.8 kg    Height:  5'  6" (1.676 m)     Physical Exam Constitutional:      General: She is not in acute distress.    Appearance: Normal appearance.  HENT:     Head: Normocephalic and atraumatic.     Mouth/Throat:     Mouth: Mucous membranes are moist.     Pharynx: Oropharynx is clear.  Eyes:     Extraocular Movements: Extraocular movements intact.     Pupils: Pupils are equal, round, and reactive to light.  Cardiovascular:     Rate and Rhythm: Normal rate and regular rhythm.     Pulses: Normal pulses.     Heart sounds: Normal heart sounds.  Pulmonary:     Effort: Pulmonary effort is normal. No respiratory distress.     Breath sounds: Normal breath sounds.  Abdominal:     General: Bowel sounds are normal. There is no distension.     Palpations: Abdomen is soft.     Tenderness: There is abdominal tenderness.  Musculoskeletal:        General: No swelling or deformity.  Skin:    General: Skin is warm and dry.  Neurological:     General: No focal deficit present.     Mental Status: Mental status is at baseline.   Labs on Admission: I have personally reviewed following labs and imaging studies  CBC: Recent Labs  Lab 02/07/21 1202  WBC 7.5  HGB 11.4*  HCT 38.3  MCV 79.3*  PLT 762    Basic Metabolic Panel: Recent Labs  Lab 02/07/21 1202  NA 137  K 3.8  CL 106  CO2 26  GLUCOSE 109*  BUN 10  CREATININE 0.71  CALCIUM 9.2    GFR: Estimated Creatinine Clearance: 84 mL/min (by C-G formula based on SCr of 0.71 mg/dL).  Liver Function Tests: Recent Labs  Lab 02/07/21 1202  AST 14*  ALT 9  ALKPHOS 61  BILITOT 0.6  PROT 8.6*  ALBUMIN 3.9    Urine analysis:    Component Value Date/Time   COLORURINE YELLOW 02/07/2021 1600   APPEARANCEUR CLEAR 02/07/2021 1600   LABSPEC 1.044 (H) 02/07/2021 1600   PHURINE 5.0 02/07/2021 1600   GLUCOSEU NEGATIVE 02/07/2021 1600   HGBUR NEGATIVE 02/07/2021 1600   BILIRUBINUR NEGATIVE 02/07/2021 1600   KETONESUR 5 (A) 02/07/2021 1600   PROTEINUR  NEGATIVE 02/07/2021 1600   UROBILINOGEN 0.2 09/27/2014 1718   NITRITE NEGATIVE 02/07/2021 1600   LEUKOCYTESUR NEGATIVE 02/07/2021 1600    Radiological Exams on Admission: CT ABDOMEN PELVIS W CONTRAST  Result Date: 02/07/2021 CLINICAL DATA:  Abdominal pain, cramping, constipation EXAM: CT ABDOMEN AND PELVIS WITH CONTRAST TECHNIQUE: Multidetector CT imaging of the abdomen and pelvis was performed using the standard protocol following bolus administration of intravenous contrast. RADIATION DOSE REDUCTION: This exam was performed according to the departmental dose-optimization program  which includes automated exposure control, adjustment of the mA and/or kV according to patient size and/or use of iterative reconstruction technique. CONTRAST:  1100mL OMNIPAQUE IOHEXOL 300 MG/ML  SOLN COMPARISON:  11/27/2020 FINDINGS: Lower chest: No acute abnormality. Hepatobiliary: No solid liver abnormality is seen. 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 No gallstones, gallbladder wall thickening, or biliary dilatation. Pancreas: Unremarkable. No pancreatic ductal dilatation or surrounding inflammatory changes. Spleen: Normal in size without significant abnormality. Adrenals/Urinary Tract: Adrenal glands are unremarkable. Kidneys are normal, without renal calculi, solid lesion, or hydronephrosis. Bladder is unremarkable. Stomach/Bowel: Stomach is within normal limits. Appendix appears normal. Large burden of stool throughout the extremely redundant proximal colon. There is severe, circumferential wall thickening and mucosal hyperenhancement involving the distal sigmoid colon and rectum to the anus, generally tethered appearance unchanged, involving at least 15-20 cm of distal sigmoid and rectum (series 2, image 65, series 4, image 60). There is again seen an abscess at the medial aspect of the rectosigmoid junction measuring  approximately 3.0 x 1.6 cm (series 2, image 65). Vascular/Lymphatic: No significant vascular findings are present. Enlarged left external iliac lymph nodes measuring up to 1.1 x 0.9 cm (series 2, image 56) Reproductive: No mass or other significant abnormality. Other: No abdominal wall hernia or abnormality. No ascites. Musculoskeletal: No acute or significant osseous findings. IMPRESSION: 1. There is severe, circumferential wall thickening and mucosal hyperenhancement involving the distal sigmoid colon and rectum to the anus, generally tethered appearance unchanged, involving at least 15-20 cm of distal sigmoid and rectum. 2. There is again seen an abscess at the medial aspect of the rectosigmoid junction measuring approximately 3.0 x 1.6 cm. 3. Findings are generally consistent with nonspecific infectious, inflammatory, or ischemic colitis, including inflammatory bowel disease such as Crohn's disease or ulcerative colitis; however an underlying malignancy is very difficult to exclude, particularly given substantial wall thickening. 4. Large burden of stool throughout the extremely redundant proximal colon, likely reflecting some degree of obstipation. 5. Enlarged left external iliac lymph nodes measuring up to 1.1 x 0.9 cm fall, nonspecific, possibly reactive, however nodal metastatic disease is not excluded per above. Electronically Signed   By: Delanna Ahmadi M.D.   On: 02/07/2021 15:31    EKG: Not obtained in the ED.  Assessment/Plan Principal Problem:   Colitis Active Problems:   Iron deficiency anemia  Colitis > Patient presenting with recurrent/persistent abdominal pain for the past several months. > Recent admission in November for the same treated with antibiotics but never really improved however did not finish outpatient course due to side effects. > Unable to get outpatient colonoscopy due to issues with poor prep initially given issues with lack of insurance since then. > CT with continued  severe wall thickening of the distal colon possible abscess though surgery doubts this and concern for infectious versus inflammatory colitis.  Also noted was a large stool burden, lymphadenopathy. > No leukocytosis, no fevers, FOBT negative. > Surgery consulted and based on stability of scan did not see any indication for surgical intervention.  GI consulted and will see the patient in consultation with plan for colonoscopy while inpatient as outpatient colonoscopy has been repeatedly delayed. > Given stability of scan and lack of leukocytosis or fever and not responding to antibiotics during previous admission we will hold off on further antibiotics at this time there is increasing suspicion for inflammatory colitis. - Monitor on telemetry - Appreciate  GI recommendations - N.p.o. - Plan for colonoscopy, hopefully tomorrow, no current large stool burden will likely preclude this - Pain control as needed with Toradol given good renal function - IV fluids overnight, 100 cc an hour  Anemia > Hemoglobin stable at 11.4. - Continue to monitor CBC  DVT prophylaxis: Lovenox Code Status:   Full  Family Communication:  None on admission.  Patient states that she is updated her family and she does need them to receive additional update. Disposition Plan:   Patient is from:  Home  Anticipated DC to:  Home  Anticipated DC date:  1 to 2 days  Anticipated DC barriers: None  Consults called:  GI consulted and will see the patient.  Case discussed with general surgery in ED, do not believe they will be seeing the patient.  Admission status:  Observation, telemetry   Severity of Illness: The appropriate patient status for this patient is OBSERVATION. Observation status is judged to be reasonable and necessary in order to provide the required intensity of service to ensure the patient's safety. The patient's presenting symptoms, physical exam findings, and initial radiographic and laboratory data in the  context of their medical condition is felt to place them at decreased risk for further clinical deterioration. Furthermore, it is anticipated that the patient will be medically stable for discharge from the hospital within 2 midnights of admission.    Marcelyn Bruins MD [facility]  How to contact the [facility] Attending or Consulting provider [place] or covering provider during after hours [place], for this patient?   Check the care team in [facility] and look for a) attending/consulting [facility] provider listed and b) the [facility] team listed Log into [email or web address] and use [place]'s universal password to access. If you do not have the password, please contact the hospital operator. Locate the [facility] provider you are looking for under [facility] and page to a number that you can be directly reached. If you still have difficulty reaching the provider, please page the [facility] (Director on Call) for the Hospitalists listed on amion for assistance.  02/07/2021, 8:03 PM

## 2021-02-08 DIAGNOSIS — E876 Hypokalemia: Secondary | ICD-10-CM | POA: Diagnosis not present

## 2021-02-08 DIAGNOSIS — Z79899 Other long term (current) drug therapy: Secondary | ICD-10-CM | POA: Diagnosis not present

## 2021-02-08 DIAGNOSIS — Z515 Encounter for palliative care: Secondary | ICD-10-CM | POA: Diagnosis not present

## 2021-02-08 DIAGNOSIS — E46 Unspecified protein-calorie malnutrition: Secondary | ICD-10-CM | POA: Diagnosis present

## 2021-02-08 DIAGNOSIS — Z8249 Family history of ischemic heart disease and other diseases of the circulatory system: Secondary | ICD-10-CM | POA: Diagnosis not present

## 2021-02-08 DIAGNOSIS — Z597 Insufficient social insurance and welfare support: Secondary | ICD-10-CM | POA: Diagnosis not present

## 2021-02-08 DIAGNOSIS — R109 Unspecified abdominal pain: Secondary | ICD-10-CM | POA: Diagnosis present

## 2021-02-08 DIAGNOSIS — Z83438 Family history of other disorder of lipoprotein metabolism and other lipidemia: Secondary | ICD-10-CM | POA: Diagnosis not present

## 2021-02-08 DIAGNOSIS — Z90710 Acquired absence of both cervix and uterus: Secondary | ICD-10-CM | POA: Diagnosis not present

## 2021-02-08 DIAGNOSIS — C179 Malignant neoplasm of small intestine, unspecified: Secondary | ICD-10-CM | POA: Diagnosis present

## 2021-02-08 DIAGNOSIS — F419 Anxiety disorder, unspecified: Secondary | ICD-10-CM | POA: Diagnosis present

## 2021-02-08 DIAGNOSIS — C187 Malignant neoplasm of sigmoid colon: Secondary | ICD-10-CM | POA: Diagnosis present

## 2021-02-08 DIAGNOSIS — K559 Vascular disorder of intestine, unspecified: Secondary | ICD-10-CM | POA: Diagnosis present

## 2021-02-08 DIAGNOSIS — Z888 Allergy status to other drugs, medicaments and biological substances status: Secondary | ICD-10-CM | POA: Diagnosis not present

## 2021-02-08 DIAGNOSIS — K56609 Unspecified intestinal obstruction, unspecified as to partial versus complete obstruction: Secondary | ICD-10-CM | POA: Diagnosis present

## 2021-02-08 DIAGNOSIS — Z20822 Contact with and (suspected) exposure to covid-19: Secondary | ICD-10-CM | POA: Diagnosis present

## 2021-02-08 DIAGNOSIS — K5190 Ulcerative colitis, unspecified, without complications: Secondary | ICD-10-CM | POA: Diagnosis present

## 2021-02-08 DIAGNOSIS — Z539 Procedure and treatment not carried out, unspecified reason: Secondary | ICD-10-CM | POA: Diagnosis present

## 2021-02-08 DIAGNOSIS — D509 Iron deficiency anemia, unspecified: Secondary | ICD-10-CM | POA: Diagnosis present

## 2021-02-08 LAB — COMPREHENSIVE METABOLIC PANEL
ALT: 8 U/L (ref 0–44)
AST: 10 U/L — ABNORMAL LOW (ref 15–41)
Albumin: 2.9 g/dL — ABNORMAL LOW (ref 3.5–5.0)
Alkaline Phosphatase: 49 U/L (ref 38–126)
Anion gap: 6 (ref 5–15)
BUN: 9 mg/dL (ref 6–20)
CO2: 23 mmol/L (ref 22–32)
Calcium: 8.6 mg/dL — ABNORMAL LOW (ref 8.9–10.3)
Chloride: 109 mmol/L (ref 98–111)
Creatinine, Ser: 0.71 mg/dL (ref 0.44–1.00)
GFR, Estimated: >60 mL/min (ref >60)
Glucose, Bld: 92 mg/dL (ref 70–99)
Potassium: 3.7 mmol/L (ref 3.5–5.1)
Sodium: 138 mmol/L (ref 135–145)
Total Bilirubin: 0.6 mg/dL (ref 0.3–1.2)
Total Protein: 6.5 g/dL (ref 6.5–8.1)

## 2021-02-08 LAB — RESP PANEL BY RT-PCR (FLU A&B, COVID) ARPGX2
Influenza A by PCR: NEGATIVE
Influenza B by PCR: NEGATIVE
SARS Coronavirus 2 by RT PCR: NEGATIVE

## 2021-02-08 LAB — CBC
HCT: 32 % — ABNORMAL LOW (ref 36.0–46.0)
Hemoglobin: 9.6 g/dL — ABNORMAL LOW (ref 12.0–15.0)
MCH: 23.9 pg — ABNORMAL LOW (ref 26.0–34.0)
MCHC: 30 g/dL (ref 30.0–36.0)
MCV: 79.6 fL — ABNORMAL LOW (ref 80.0–100.0)
Platelets: 288 10*3/uL (ref 150–400)
RBC: 4.02 MIL/uL (ref 3.87–5.11)
RDW: 17.3 % — ABNORMAL HIGH (ref 11.5–15.5)
WBC: 7 10*3/uL (ref 4.0–10.5)
nRBC: 0 % (ref 0.0–0.2)

## 2021-02-08 MED ORDER — BISACODYL 5 MG PO TBEC
10.0000 mg | DELAYED_RELEASE_TABLET | Freq: Once | ORAL | Status: AC
  Administered 2021-02-08: 10 mg via ORAL
  Filled 2021-02-08: qty 2

## 2021-02-08 MED ORDER — PEG 3350-KCL-NA BICARB-NACL 420 G PO SOLR
4000.0000 mL | Freq: Once | ORAL | Status: AC
Start: 2021-02-08 — End: 2021-02-08
  Administered 2021-02-08: 4000 mL via ORAL
  Filled 2021-02-08: qty 4000

## 2021-02-08 NOTE — Anesthesia Preprocedure Evaluation (Addendum)
Anesthesia Evaluation  Patient identified by MRN, date of birth, ID band Patient awake    Reviewed: Allergy & Precautions, NPO status , Patient's Chart, lab work & pertinent test results  Airway Mallampati: I  TM Distance: >3 FB Neck ROM: Full    Dental no notable dental hx. (+) Teeth Intact, Dental Advisory Given   Pulmonary neg pulmonary ROS,    Pulmonary exam normal breath sounds clear to auscultation       Cardiovascular Exercise Tolerance: Good Normal cardiovascular exam Rhythm:Regular Rate:Normal     Neuro/Psych Anxiety    GI/Hepatic Neg liver ROS,   Endo/Other  negative endocrine ROS  Renal/GU Lab Results      Component                Value               Date                      CREATININE               0.71                02/08/2021                BUN                      9                   02/08/2021                NA                       138                 02/08/2021                K                        3.7                 02/08/2021                CL                       109                 02/08/2021                CO2                      23                  02/08/2021                Musculoskeletal   Abdominal   Peds  Hematology  (+) anemia , Lab Results      Component                Value               Date                      WBC                      7.0  02/08/2021                HGB                      9.6 (L)             02/08/2021                HCT                      32.0 (L)            02/08/2021                MCV                      79.6 (L)            02/08/2021                PLT                      288                 02/08/2021              Anesthesia Other Findings   Reproductive/Obstetrics                            Anesthesia Physical Anesthesia Plan  ASA: 3  Anesthesia Plan: MAC   Post-op Pain Management:    Induction:    PONV Risk Score and Plan: Treatment may vary due to age or medical condition  Airway Management Planned: Natural Airway and Simple Face Mask  Additional Equipment: None  Intra-op Plan:   Post-operative Plan:   Informed Consent: I have reviewed the patients History and Physical, chart, labs and discussed the procedure including the risks, benefits and alternatives for the proposed anesthesia with the patient or authorized representative who has indicated his/her understanding and acceptance.     Dental advisory given  Plan Discussed with: CRNA and Anesthesiologist  Anesthesia Plan Comments: (Colitis and abnormal CT)       Anesthesia Quick Evaluation

## 2021-02-08 NOTE — Progress Notes (Signed)
PROGRESS NOTE    Barbara Valdez  JOA:[id number] DOB: [birth year] DOA: 02/07/2021 PCP: Pcp, No   Brief Narrative:  Barbara Valdez is a 46 y.o. female with medical history significant of fibroids, iron deficiency anemia, colitis, anxiety who presents with recurrent abdominal pain.  Recent admission November 2022 discharged on antibiotics but did not complete the course due to side effects of nausea, has been attempting to have outpatient colonoscopy for the past 3 months with multiple delays in the setting of insurance and scheduling.  She presents to the ED on 02/07/2021 with profoundly worsening abdominal pain, imaging concerning for colitis.   Assessment & Plan:   Acute intractable abdominal pain in the setting of acute on chronic recurrent colitis, POA -GI following, appreciate insight and recommendations -Multiple delays outpatient setting due to scheduling, poor compliance with bowel prep and insurance issues -Pending endoscopy at this time to rule out inflammatory colitis versus infectious process, less likely given multiple rounds of antibiotics with minimal to no improvement in symptoms. -Surgery consulted at intake and given imaging and patient's clinical course they do not feel there is any current indication for surgery -Patient does not meet sepsis criteria, unclear if this is an infectious colitis as such we will hold off on antibiotics follow cultures and symptoms   Anemia, microcytic -Chronic questionably anemia of chronic disease and malabsorption given above   DVT prophylaxis: SCDs and early ambulation Code Status:              Full  Family Communication: None present  Status is: Inpatient  Dispo: The patient is from: Home              Anticipated d/c is to: Home              Anticipated d/c date is: 24 to 48 hours              Patient currently not medically stable for discharge  Consultants:  GI  Procedures:  Colonoscopy 02/09/2021  Antimicrobials:  None  indicated  Subjective: No acute issues or events overnight abdominal pain markedly well and controlled, denies nausea vomiting diarrhea constipation headache fevers chills or chest pain  Objective: Vitals:   02/08/21 0230 02/08/21 0411 02/08/21 0500 02/08/21 0700  BP: 103/70 111/73 111/77 97/82  Pulse: 89 78 73 91  Resp: 14 11 16  (!) 27  Temp:      TempSrc:      SpO2: 99% 100% 100% 99%  Weight:      Height:        Intake/Output Summary (Last 24 hours) at 02/08/2021 0819 Last data filed at 02/08/2021 0739 Gross per 24 hour  Intake 963.91 ml  Output --  Net 963.91 ml   Filed Weights   02/07/21 1142  Weight: 60.8 kg    Examination:  General exam: Appears calm and comfortable  Respiratory system: Clear to auscultation. Respiratory effort normal. Cardiovascular system: S1 & S2 heard, RRR. No JVD, murmurs, rubs, gallops or clicks. No pedal edema. Gastrointestinal system: Abdomen is nondistended, soft and nontender. No organomegaly or masses felt. Normal bowel sounds heard. Central nervous system: Alert and oriented. No focal neurological deficits. Extremities: Symmetric 5 x 5 power. Skin: No rashes, lesions or ulcers Psychiatry: Judgement and insight appear normal. Mood & affect appropriate.     Data Reviewed: I have personally reviewed following labs and imaging studies  CBC: Recent Labs  Lab 02/07/21 1202 02/08/21 0409  WBC 7.5 7.0  HGB 11.4* 9.6*  HCT  38.3 32.0*  MCV 79.3* 79.6*  PLT 389 062   Basic Metabolic Panel: Recent Labs  Lab 02/07/21 1202 02/08/21 0409  NA 137 138  K 3.8 3.7  CL 106 109  CO2 26 23  GLUCOSE 109* 92  BUN 10 9  CREATININE 0.71 0.71  CALCIUM 9.2 8.6*   GFR: Estimated Creatinine Clearance: 84 mL/min (by C-G formula based on SCr of 0.71 mg/dL). Liver Function Tests: Recent Labs  Lab 02/07/21 1202 02/08/21 0409  AST 14* 10*  ALT 9 8  ALKPHOS 61 49  BILITOT 0.6 0.6  PROT 8.6* 6.5  ALBUMIN 3.9 2.9*   Recent Labs  Lab  02/07/21 1202  LIPASE 24   No results for input(s): AMMONIA in the last 168 hours. Coagulation Profile: No results for input(s): INR, PROTIME in the last 168 hours. Cardiac Enzymes: No results for input(s): CKTOTAL, CKMB, CKMBINDEX, TROPONINI in the last 168 hours. BNP (last 3 results) No results for input(s): PROBNP in the last 8760 hours. HbA1C: No results for input(s): HGBA1C in the last 72 hours. CBG: No results for input(s): GLUCAP in the last 168 hours. Lipid Profile: No results for input(s): CHOL, HDL, LDLCALC, TRIG, CHOLHDL, LDLDIRECT in the last 72 hours. Thyroid Function Tests: No results for input(s): TSH, T4TOTAL, FREET4, T3FREE, THYROIDAB in the last 72 hours. Anemia Panel: No results for input(s): VITAMINB12, FOLATE, FERRITIN, TIBC, IRON, RETICCTPCT in the last 72 hours. Sepsis Labs: No results for input(s): PROCALCITON, LATICACIDVEN in the last 168 hours.  No results found for this or any previous visit (from the past 240 hour(s)).       Radiology Studies: CT ABDOMEN PELVIS W CONTRAST  Result Date: 02/07/2021 CLINICAL DATA:  Abdominal pain, cramping, constipation EXAM: CT ABDOMEN AND PELVIS WITH CONTRAST TECHNIQUE: Multidetector CT imaging of the abdomen and pelvis was performed using the standard protocol following bolus administration of intravenous contrast. RADIATION DOSE REDUCTION: This exam was performed according to the departmental dose-optimization program which includes automated exposure control, adjustment of the mA and/or kV according to patient size and/or use of iterative reconstruction technique. CONTRAST:  151m OMNIPAQUE IOHEXOL 300 MG/ML  SOLN COMPARISON:  11/27/2020 FINDINGS: Lower chest: No acute abnormality. Hepatobiliary: No solid liver abnormality is seen. 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 No gallstones, gallbladder wall thickening, or biliary  dilatation. Pancreas: Unremarkable. No pancreatic ductal dilatation or surrounding inflammatory changes. Spleen: Normal in size without significant abnormality. Adrenals/Urinary Tract: Adrenal glands are unremarkable. Kidneys are normal, without renal calculi, solid lesion, or hydronephrosis. Bladder is unremarkable. Stomach/Bowel: Stomach is within normal limits. Appendix appears normal. Large burden of stool throughout the extremely redundant proximal colon. There is severe, circumferential wall thickening and mucosal hyperenhancement involving the distal sigmoid colon and rectum to the anus, generally tethered appearance unchanged, involving at least 15-20 cm of distal sigmoid and rectum (series 2, image 65, series 4, image 60). There is again seen an abscess at the medial aspect of the rectosigmoid junction measuring approximately 3.0 x 1.6 cm (series 2, image 65). Vascular/Lymphatic: No significant vascular findings are present. Enlarged left external iliac lymph nodes measuring up to 1.1 x 0.9 cm (series 2, image 56) Reproductive: No mass or other significant abnormality. Other: No abdominal wall hernia or abnormality. No ascites. Musculoskeletal: No acute or significant osseous findings. IMPRESSION: 1. There is severe, circumferential wall thickening and mucosal hyperenhancement involving the distal sigmoid colon and rectum  to the anus, generally tethered appearance unchanged, involving at least 15-20 cm of distal sigmoid and rectum. 2. There is again seen an abscess at the medial aspect of the rectosigmoid junction measuring approximately 3.0 x 1.6 cm. 3. Findings are generally consistent with nonspecific infectious, inflammatory, or ischemic colitis, including inflammatory bowel disease such as Crohn's disease or ulcerative colitis; however an underlying malignancy is very difficult to exclude, particularly given substantial wall thickening. 4. Large burden of stool throughout the extremely redundant  proximal colon, likely reflecting some degree of obstipation. 5. Enlarged left external iliac lymph nodes measuring up to 1.1 x 0.9 cm fall, nonspecific, possibly reactive, however nodal metastatic disease is not excluded per above. Electronically Signed   By: Delanna Ahmadi M.D.   On: 02/07/2021 15:31     Scheduled Meds:  sodium chloride flush  3 mL Intravenous Q12H   Continuous Infusions:  sodium chloride 100 mL/hr at 02/08/21 0739     LOS: 0 days   Time spent: 82mn  [place] C Yannis Broce, DO [facility]  If 7PM-7AM, please contact night-coverage [email or web address]  02/08/2021, 8:19 AM

## 2021-02-08 NOTE — Consult Note (Signed)
Barbara Valdez 03-29-1975  [id number].    Requesting MD: Dr. Avon Gully Chief Complaint/Reason for Consult: Colitis   HPI: Barbara Valdez is a 46 y.o. female who presented to the ED for abdominal pain. Patient was recently admitted to the hospital in November 2022 for colitis/proctocolitis involving the rectosigmoid colon. Her symptoms initially began in April 2022 w/ abdominal pain, cramping, and occasional bleeding per rectum since April 2022. Per GI notes, she had an outpatient colonoscopy in 08/2020 but this was unable to be completed due to poor prep. During admission in Nov, CT showed possible 3.9cm interloop abscess center around the sigmoid mesocolon that was not amenable to drainage after reviewing with IR - imaging was reviewed again with radiology who felt that she actually did not have any abscess. She was d/c with 10d of Cipro/Flagyl and Miralax and Metamucil BID bowel regimen. Patient did not complete Cipro/Flagyl regimen at home. She was not able to get outpatient colonoscopy after d/c 2/2 insurance issues.  She reports since discharge she has been having intermittent episodes of constipation where she cannot go for several days followed by multiple loose stools per day associated with mucus and dark red blood clots. She has been having daily episodes of lower abdominal cramping that is usually accompanied before BM's. She also reports nausea and decreased appetite. She has lost 30lb since discharge from the hospital. Denies fever or chills. She presented today for ongoing lower abdominal pain for the last several months that has not necessarily gotten worse but it has not improved.  In the ED patient was afebrile, slightly tachy to 107 which has since improved, and had a soft BP to 93/58 which has also improved after IVF. WBC 7, hgb 11.4 > 9.6. CT showed severe circumferential wall thickening and mucosal hyperenhancement involving distal sigmoid colon and rectum to the anus. There is a  fluid collection concerning for a possible abscess at medial aspect of rectosigmoid junction measuring 3.0 x 1.6 cm. TRH admitted. GI consulted and planning for colonoscopy tomorrow.   She has a hx of Hysterectomy for dysfunctional bleeding. no other abdominal surgery except for a tubal ligation.   ROS: Review of Systems  Constitutional:  Negative for chills and fever.  Gastrointestinal:  Positive for abdominal pain, blood in stool, constipation, diarrhea, melena and nausea.  All other systems reviewed and are negative.  Family History  Problem Relation Age of Onset   Hyperlipidemia Father    Hypertension Other    Breast cancer Neg Hx     Past Medical History:  Diagnosis Date   Anemia    Anxiety    Complication of anesthesia    took a long time to wake up after surgery    Past Surgical History:  Procedure Laterality Date   ABDOMINAL HYSTERECTOMY Bilateral 09/26/2016   Procedure: HYSTERECTOMY ABDOMINAL W/ BILATERAL SALPINGECTOMY;  Surgeon: Emily Filbert, MD;  Location: [facility];  Service: Gynecology;  Laterality: Bilateral;   BREAST BIOPSY Left    TUBAL LIGATION      Social History:  reports that she has never smoked. She has never used smokeless tobacco. She reports that she does not drink alcohol and does not use drugs.  Allergies: No Known Allergies  (Not in a hospital admission)    Physical Exam: Blood pressure 104/75, pulse 81, temperature 98.7 F (37.1 C), temperature source Oral, resp. rate 17, height 5' 6"  (1.676 m), weight 60.8 kg, last menstrual period 10/23/2016, SpO2 99 %. General: pleasant, WD/WN female  who is laying in bed in NAD HEENT: head is normocephalic, atraumatic.  Sclera are noninjected.  Pupils equal and round.  Ears and nose without any masses or lesions.  Mouth is pink and moist. Dentition fair Heart: regular, rate, and rhythm.  Normal s1,s2. No obvious murmurs, gallops, or rubs noted.  Palpable pedal pulses bilaterally  Lungs: CTAB, no wheezes,  rhonchi, or rales noted.  Respiratory effort nonlabored Abd: Soft, ND, +BS, no masses, hernias, or organomegaly. Mild lower abdominal TTP without rebound or guarding MS: no BUE/BLE edema, calves soft and nontender Skin: warm and dry with no masses, lesions, or rashes Psych: A&Ox4 with an appropriate affect Neuro: cranial nerves grossly intact, equal strength in BUE/BLE bilaterally, normal speech, thought process intact, moves all extremities, gait not assessed   Results for orders placed or performed during the hospital encounter of 02/07/21 (from the past 48 hour(s))  Lipase, blood     Status: None   Collection Time: 02/07/21 12:02 PM  Result Value Ref Range   Lipase 24 11 - 51 U/L    Comment: Performed at [facility], [place] [address] [place], [place] [address]  Comprehensive metabolic panel     Status: Abnormal   Collection Time: 02/07/21 12:02 PM  Result Value Ref Range   Sodium 137 135 - 145 mmol/L   Potassium 3.8 3.5 - 5.1 mmol/L   Chloride 106 98 - 111 mmol/L   CO2 26 22 - 32 mmol/L   Glucose, Bld 109 (H) 70 - 99 mg/dL    Comment: Glucose reference range applies only to samples taken after fasting for at least 8 hours.   BUN 10 6 - 20 mg/dL   Creatinine, Ser 0.71 0.44 - 1.00 mg/dL   Calcium 9.2 8.9 - 10.3 mg/dL   Total Protein 8.6 (H) 6.5 - 8.1 g/dL   Albumin 3.9 3.5 - 5.0 g/dL   AST 14 (L) 15 - 41 U/L   ALT 9 0 - 44 U/L   Alkaline Phosphatase 61 38 - 126 U/L   Total Bilirubin 0.6 0.3 - 1.2 mg/dL   GFR, Estimated >60 >60 mL/min    Comment: (NOTE) Calculated using the CKD-EPI Creatinine Equation (2021)    Anion gap 5 5 - 15    Comment: Performed at [facility] [place] [address], [place], [place] [address]  CBC     Status: Abnormal   Collection Time: 02/07/21 12:02 PM  Result Value Ref Range   WBC 7.5 4.0 - 10.5 K/uL   RBC 4.83 3.87 - 5.11 MIL/uL   Hemoglobin 11.4 (L) 12.0 - 15.0 g/dL   HCT 38.3 36.0 - 46.0 %   MCV 79.3 (L) 80.0 -  100.0 fL   MCH 23.6 (L) 26.0 - 34.0 pg   MCHC 29.8 (L) 30.0 - 36.0 g/dL   RDW 17.4 (H) 11.5 - 15.5 %   Platelets 389 150 - 400 K/uL   nRBC 0.0 0.0 - 0.2 %    Comment: Performed at [facility], [place] [address], [place], [place] [address]  Occult blood card to lab, stool     Status: None   Collection Time: 02/07/21  2:28 PM  Result Value Ref Range   Fecal Occult Bld NEGATIVE NEGATIVE    Comment: Performed at [facility], [place] [address], [place], [place] [address]  Urinalysis, Routine w reflex microscopic     Status: Abnormal   Collection Time: 02/07/21  4:00 PM  Result  Value Ref Range   Color, Urine YELLOW YELLOW   APPearance CLEAR CLEAR   Specific Gravity, Urine 1.044 (H) 1.005 - 1.030   pH 5.0 5.0 - 8.0   Glucose, UA NEGATIVE NEGATIVE mg/dL   Hgb urine dipstick NEGATIVE NEGATIVE   Bilirubin Urine NEGATIVE NEGATIVE   Ketones, ur 5 (A) NEGATIVE mg/dL   Protein, ur NEGATIVE NEGATIVE mg/dL   Nitrite NEGATIVE NEGATIVE   Leukocytes,Ua NEGATIVE NEGATIVE    Comment: Performed at [place] [address], [place], [place] [address]  Comprehensive metabolic panel     Status: Abnormal   Collection Time: 02/08/21  4:09 AM  Result Value Ref Range   Sodium 138 135 - 145 mmol/L   Potassium 3.7 3.5 - 5.1 mmol/L   Chloride 109 98 - 111 mmol/L   CO2 23 22 - 32 mmol/L   Glucose, Bld 92 70 - 99 mg/dL    Comment: Glucose reference range applies only to samples taken after fasting for at least 8 hours.   BUN 9 6 - 20 mg/dL   Creatinine, Ser 0.71 0.44 - 1.00 mg/dL   Calcium 8.6 (L) 8.9 - 10.3 mg/dL   Total Protein 6.5 6.5 - 8.1 g/dL   Albumin 2.9 (L) 3.5 - 5.0 g/dL   AST 10 (L) 15 - 41 U/L   ALT 8 0 - 44 U/L   Alkaline Phosphatase 49 38 - 126 U/L   Total Bilirubin 0.6 0.3 - 1.2 mg/dL   GFR, Estimated >60 >60 mL/min    Comment: (NOTE) Calculated using the CKD-EPI Creatinine Equation (2021)    Anion gap 6 5 - 15    Comment:  Performed at [facility], [place] [address], [place], [place] [address]  CBC     Status: Abnormal   Collection Time: 02/08/21  4:09 AM  Result Value Ref Range   WBC 7.0 4.0 - 10.5 K/uL   RBC 4.02 3.87 - 5.11 MIL/uL   Hemoglobin 9.6 (L) 12.0 - 15.0 g/dL   HCT 32.0 (L) 36.0 - 46.0 %   MCV 79.6 (L) 80.0 - 100.0 fL   MCH 23.9 (L) 26.0 - 34.0 pg   MCHC 30.0 30.0 - 36.0 g/dL   RDW 17.3 (H) 11.5 - 15.5 %   Platelets 288 150 - 400 K/uL   nRBC 0.0 0.0 - 0.2 %    Comment: Performed at [facility], [place] [address] [place], [place] [address]  Resp Panel by RT-PCR (Flu A&B, Covid) Nasopharyngeal Swab     Status: None   Collection Time: 02/08/21  8:50 AM   Specimen: Nasopharyngeal Swab; Nasopharyngeal(NP) swabs in vial transport medium  Result Value Ref Range   SARS Coronavirus 2 by RT PCR NEGATIVE NEGATIVE    Comment: (NOTE) SARS-CoV-2 target nucleic acids are NOT DETECTED.  The SARS-CoV-2 RNA is generally detectable in upper respiratory specimens during the acute phase of infection. The lowest concentration of SARS-CoV-2 viral copies this assay can detect is 138 copies/mL. A negative result does not preclude SARS-Cov-2 infection and should not be used as the sole basis for treatment or other patient management decisions. A negative result may occur with  improper specimen collection/handling, submission of specimen other than nasopharyngeal swab, presence of viral mutation(s) within the areas targeted by this assay, and inadequate number of viral copies(<138 copies/mL). A negative result must be combined with clinical observations, patient history, and epidemiological information. The expected result is Negative.  Fact Sheet for Patients:  EntrepreneurPulse.com.au  Fact Sheet for Healthcare Providers:  IncredibleEmployment.be  This test is no t yet approved or cleared by the [place] FDA and  has been  authorized for detection and/or diagnosis of SARS-CoV-2 by FDA under an Emergency Use Authorization (EUA). This EUA will remain  in effect (meaning this test can be used) for the duration of the COVID-19 declaration under Section 564(b)(1) of the Act, 21 U.S.C.section 360bbb-3(b)(1), unless the authorization is terminated  or revoked sooner.       Influenza A by PCR NEGATIVE NEGATIVE   Influenza B by PCR NEGATIVE NEGATIVE    Comment: (NOTE) The Xpert Xpress SARS-CoV-2/FLU/RSV plus assay is intended as an aid in the diagnosis of influenza from Nasopharyngeal swab specimens and should not be used as a sole basis for treatment. Nasal washings and aspirates are unacceptable for Xpert Xpress SARS-CoV-2/FLU/RSV testing.  Fact Sheet for Patients: EntrepreneurPulse.com.au  Fact Sheet for Healthcare Providers: IncredibleEmployment.be  This test is not yet approved or cleared by the [place] FDA and has been authorized for detection and/or diagnosis of SARS-CoV-2 by FDA under an Emergency Use Authorization (EUA). This EUA will remain in effect (meaning this test can be used) for the duration of the COVID-19 declaration under Section 564(b)(1) of the Act, 21 U.S.C. section 360bbb-3(b)(1), unless the authorization is terminated or revoked.  Performed at [facility], [place] [address], [place], [place] [address]    CT ABDOMEN PELVIS W CONTRAST  Result Date: 02/07/2021 CLINICAL DATA:  Abdominal pain, cramping, constipation EXAM: CT ABDOMEN AND PELVIS WITH CONTRAST TECHNIQUE: Multidetector CT imaging of the abdomen and pelvis was performed using the standard protocol following bolus administration of intravenous contrast. RADIATION DOSE REDUCTION: This exam was performed according to the departmental dose-optimization program which includes automated exposure control, adjustment of the mA and/or kV according to patient size and/or use of  iterative reconstruction technique. CONTRAST:  187m OMNIPAQUE IOHEXOL 300 MG/ML  SOLN COMPARISON:  11/27/2020 FINDINGS: Lower chest: No acute abnormality. Hepatobiliary: No solid liver abnormality is seen. 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 No gallstones, gallbladder wall thickening, or biliary dilatation. Pancreas: Unremarkable. No pancreatic ductal dilatation or surrounding inflammatory changes. Spleen: Normal in size without significant abnormality. Adrenals/Urinary Tract: Adrenal glands are unremarkable. Kidneys are normal, without renal calculi, solid lesion, or hydronephrosis. Bladder is unremarkable. Stomach/Bowel: Stomach is within normal limits. Appendix appears normal. Large burden of stool throughout the extremely redundant proximal colon. There is severe, circumferential wall thickening and mucosal hyperenhancement involving the distal sigmoid colon and rectum to the anus, generally tethered appearance unchanged, involving at least 15-20 cm of distal sigmoid and rectum (series 2, image 65, series 4, image 60). There is again seen an abscess at the medial aspect of the rectosigmoid junction measuring approximately 3.0 x 1.6 cm (series 2, image 65). Vascular/Lymphatic: No significant vascular findings are present. Enlarged left external iliac lymph nodes measuring up to 1.1 x 0.9 cm (series 2, image 56) Reproductive: No mass or other significant abnormality. Other: No abdominal wall hernia or abnormality. No ascites. Musculoskeletal: No acute or significant osseous findings. IMPRESSION: 1. There is severe, circumferential wall thickening and mucosal hyperenhancement involving the distal sigmoid colon and rectum to the anus, generally tethered appearance unchanged, involving at least 15-20 cm of distal sigmoid and rectum. 2. There is again seen an abscess at the medial aspect of the rectosigmoid junction  measuring approximately  3.0 x 1.6 cm. 3. Findings are generally consistent with nonspecific infectious, inflammatory, or ischemic colitis, including inflammatory bowel disease such as Crohn's disease or ulcerative colitis; however an underlying malignancy is very difficult to exclude, particularly given substantial wall thickening. 4. Large burden of stool throughout the extremely redundant proximal colon, likely reflecting some degree of obstipation. 5. Enlarged left external iliac lymph nodes measuring up to 1.1 x 0.9 cm fall, nonspecific, possibly reactive, however nodal metastatic disease is not excluded per above. Electronically Signed   By: Delanna Ahmadi M.D.   On: 02/07/2021 15:31    Anti-infectives (From admission, onward)    None       Assessment/Plan Proctocolitis  - involving 15-20 cm of distal sigmoid and rectum  - CT does note a persistent fluid collection at the medial aspect of the rectosigmoid junction measuring 3.0 x 1.6 cm. This was seen on previous CT scan from November - at that time imaging was reviewed further with IR and colorectal surgeon who felt it was not actually an abscess - Ok to hold abx from our standpoint - Agree with GI plan for Colonoscopy tomorrow - No current indication for surgical intervention. Will follow up on results of Colonoscopy.   FEN - IVF, CLD VTE - SCDs, okay for chemical propylaxis from a general surgery standpoint ID - none   Moderate Medical Decision Making  Wellington [place], [facility] 02/08/2021, 10:23 AM Please see Amion for pager number during day hours 7:00am-4:30pm

## 2021-02-08 NOTE — Consult Note (Signed)
Referring Provider: [facility] Primary Care Physician:  Pcp, No Primary Gastroenterologist:  Dr. Therisa Doyne  Reason for Consultation:  Abdominal pain  HPI: Barbara Valdez is a 46 y.o. female medical history significant of fibroids, iron deficiency anemia, colitis, hysterectomy, anxiety who presents for abdominal pain.  EGD/colonoscopy 08/2020 with Dr. Cristina Gong: EGD was unrevealing with normal biopsies.  Colonoscopy was unable to be completed due to poor prep.  Patient was scheduled for 2 subsequent colonoscopies after that which were both canceled.  Patient was recently admitted to hospital November 2022 for proctocolitis of sigmoid/upper rectum.  There was initial concern for 3.9 cm abscess, general surgery recommended conservative treatment.  Patient was discharged on Cipro and Flagyl for 10 days.  Patient did not complete antibiotic course as she was unable to tolerate it.  States that since last admission her bowel movements have been atypical.  She states she has not had a solid stool since her admission.  She can have multiple loose stools per day associated with mucus and dark red blood clots, or go a few days without having a stool.  Typically every bowel movement is accompanied by lower abdominal cramping.  Denies family history of colon cancer or other GI issues.  Denies NSAID use.  Past Medical History:  Diagnosis Date   Anemia    Anxiety    Complication of anesthesia    took a long time to wake up after surgery    Past Surgical History:  Procedure Laterality Date   ABDOMINAL HYSTERECTOMY Bilateral 09/26/2016   Procedure: HYSTERECTOMY ABDOMINAL W/ BILATERAL SALPINGECTOMY;  Surgeon: Emily Filbert, MD;  Location: [facility];  Service: Gynecology;  Laterality: Bilateral;   BREAST BIOPSY Left    TUBAL LIGATION      Prior to Admission medications   Medication Sig Start Date End Date Taking? Authorizing Provider  Ginger, Zingiber officinalis, (GINGER ROOT PO) Take 4-6 fluid ounces by mouth See admin  instructions. Mix ginger root with lemon and 4-6 ounces of water, boil, and drink the tea one to two times a day as needed for constipation   Yes [provider]  polyethylene glycol (MIRALAX / GLYCOLAX) 17 g packet Take 17 g by mouth 2 (two) times daily. Patient not taking: Reported on 02/07/2021 12/01/20   Mariel Aloe, MD  psyllium (METAMUCIL SMOOTH TEXTURE) 58.6 % powder Take 1 packet by mouth 2 (two) times daily. Patient not taking: Reported on 02/07/2021 12/01/20   Mariel Aloe, MD    Scheduled Meds:  sodium chloride flush  3 mL Intravenous Q12H   Continuous Infusions:  sodium chloride 100 mL/hr at 02/08/21 0739   PRN Meds:.ketorolac  Allergies as of 02/07/2021   (No Known Allergies)    Family History  Problem Relation Age of Onset   Hyperlipidemia Father    Hypertension Other    Breast cancer Neg Hx     Social History   Socioeconomic History   Marital status: Single    Spouse name: Not on file   Number of children: Not on file   Years of education: Not on file   Highest education level: Not on file  Occupational History   Not on file  Tobacco Use   Smoking status: Never   Smokeless tobacco: Never  Vaping Use   Vaping Use: Never used  Substance and Sexual Activity   Alcohol use: No   Drug use: No   Sexual activity: Yes    Birth control/protection: Surgical  Other Topics Concern  Not on file  Social History Narrative   Not on file   Social Determinants of Health   Financial Resource Strain: Not on file  Food Insecurity: Not on file  Transportation Needs: Not on file  Physical Activity: Not on file  Stress: Not on file  Social Connections: Not on file  Intimate Partner Violence: Not on file    Review of Systems: Review of Systems  Constitutional:  Negative for chills and fever.  HENT:  Negative for hearing loss and tinnitus.   Eyes:  Negative for blurred vision and double vision.  Respiratory:  Negative for cough and hemoptysis.    Cardiovascular:  Negative for chest pain and palpitations.  Gastrointestinal:  Positive for abdominal pain, blood in stool, constipation and diarrhea. Negative for heartburn, melena, nausea and vomiting.  Genitourinary:  Negative for dysuria and urgency.  Musculoskeletal:  Negative for myalgias and neck pain.  Skin:  Negative for itching and rash.  Neurological:  Negative for seizures and loss of consciousness.  Psychiatric/Behavioral:  Negative for substance abuse. The patient is not nervous/anxious.     Physical Exam: Physical Exam Constitutional:      Appearance: She is well-developed.  HENT:     Head: Normocephalic and atraumatic.     Nose: Nose normal. No congestion.     Mouth/Throat:     Mouth: Mucous membranes are moist.     Pharynx: Oropharynx is clear.  Eyes:     Extraocular Movements: Extraocular movements intact.     Comments: Conjunctival pallor  Cardiovascular:     Rate and Rhythm: Normal rate and regular rhythm.  Pulmonary:     Effort: Pulmonary effort is normal. No respiratory distress.  Abdominal:     General: Abdomen is flat. Bowel sounds are normal. There is no distension.     Palpations: Abdomen is soft. There is no mass.     Tenderness: There is no abdominal tenderness. There is no guarding or rebound.     Hernia: No hernia is present.  Musculoskeletal:        General: No swelling. Normal range of motion.     Cervical back: Normal range of motion and neck supple.  Skin:    General: Skin is warm and dry.  Neurological:     General: No focal deficit present.     Mental Status: She is alert and oriented to person, place, and time.  Psychiatric:        Mood and Affect: Mood normal.        Behavior: Behavior normal.        Thought Content: Thought content normal.        Judgment: Judgment normal.    Vital signs: Vitals:   02/08/21 0500 02/08/21 0700  BP: 111/77 97/82  Pulse: 73 91  Resp: 16 (!) 27  Temp:    SpO2: 100% 99%        GI:  Lab  Results: Recent Labs    02/07/21 1202 02/08/21 0409  WBC 7.5 7.0  HGB 11.4* 9.6*  HCT 38.3 32.0*  PLT 389 288   BMET Recent Labs    02/07/21 1202 02/08/21 0409  NA 137 138  K 3.8 3.7  CL 106 109  CO2 26 23  GLUCOSE 109* 92  BUN 10 9  CREATININE 0.71 0.71  CALCIUM 9.2 8.6*   LFT Recent Labs    02/08/21 0409  PROT 6.5  ALBUMIN 2.9*  AST 10*  ALT 8  ALKPHOS 49  BILITOT 0.6  PT/INR No results for input(s): LABPROT, INR in the last 72 hours.   Studies/Results: CT ABDOMEN PELVIS W CONTRAST  Result Date: 02/07/2021 CLINICAL DATA:  Abdominal pain, cramping, constipation EXAM: CT ABDOMEN AND PELVIS WITH CONTRAST TECHNIQUE: Multidetector CT imaging of the abdomen and pelvis was performed using the standard protocol following bolus administration of intravenous contrast. RADIATION DOSE REDUCTION: This exam was performed according to the departmental dose-optimization program which includes automated exposure control, adjustment of the mA and/or kV according to patient size and/or use of iterative reconstruction technique. CONTRAST:  178mL OMNIPAQUE IOHEXOL 300 MG/ML  SOLN COMPARISON:  11/27/2020 FINDINGS: Lower chest: No acute abnormality. Hepatobiliary: No solid liver abnormality is seen. 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 No gallstones, gallbladder wall thickening, or biliary dilatation. Pancreas: Unremarkable. No pancreatic ductal dilatation or surrounding inflammatory changes. Spleen: Normal in size without significant abnormality. Adrenals/Urinary Tract: Adrenal glands are unremarkable. Kidneys are normal, without renal calculi, solid lesion, or hydronephrosis. Bladder is unremarkable. Stomach/Bowel: Stomach is within normal limits. Appendix appears normal. Large burden of stool throughout the extremely redundant proximal colon. There is severe, circumferential wall thickening and  mucosal hyperenhancement involving the distal sigmoid colon and rectum to the anus, generally tethered appearance unchanged, involving at least 15-20 cm of distal sigmoid and rectum (series 2, image 65, series 4, image 60). There is again seen an abscess at the medial aspect of the rectosigmoid junction measuring approximately 3.0 x 1.6 cm (series 2, image 65). Vascular/Lymphatic: No significant vascular findings are present. Enlarged left external iliac lymph nodes measuring up to 1.1 x 0.9 cm (series 2, image 56) Reproductive: No mass or other significant abnormality. Other: No abdominal wall hernia or abnormality. No ascites. Musculoskeletal: No acute or significant osseous findings. IMPRESSION: 1. There is severe, circumferential wall thickening and mucosal hyperenhancement involving the distal sigmoid colon and rectum to the anus, generally tethered appearance unchanged, involving at least 15-20 cm of distal sigmoid and rectum. 2. There is again seen an abscess at the medial aspect of the rectosigmoid junction measuring approximately 3.0 x 1.6 cm. 3. Findings are generally consistent with nonspecific infectious, inflammatory, or ischemic colitis, including inflammatory bowel disease such as Crohn's disease or ulcerative colitis; however an underlying malignancy is very difficult to exclude, particularly given substantial wall thickening. 4. Large burden of stool throughout the extremely redundant proximal colon, likely reflecting some degree of obstipation. 5. Enlarged left external iliac lymph nodes measuring up to 1.1 x 0.9 cm fall, nonspecific, possibly reactive, however nodal metastatic disease is not excluded per above. Electronically Signed   By: Delanna Ahmadi M.D.   On: 02/07/2021 15:31    Impression: Colitis -No leukocytosis -Normal renal function -CT abdomen pelvis with contrast 1/16: Severe circumferential wall thickening and mucosal hyperenhancement involving distal sigmoid colon and rectum to  the anus.  Abscess at medial aspect of rectosigmoid junction measuring 3.0 x 1.6 cm.  Findings consistent with infectious, inflammatory, or ischemic colitis.  Cannot exclude neoplasm.  Large burden of stool throughout proximal colon reflecting some degree of obstipation, enlarged left external iliac lymph nodes - Surgery has been consulted, no indication for surgical intervention due to stability of scan.  Anemia -Hgb 9.6, MCV 79.6  Plan: Plan for Colonoscopy tomorrow. I thoroughly discussed the procedures to include nature, alternatives, benefits, and risks including but not limited to bleeding, perforation, infection, anesthesia/cardiac and pulmonary complications. Patient provides understanding and gave  verbal consent to proceed. Nulytely prep, clear liquid diet, NPO at midnight. Continue daily CBC with transfusion as needed to maintain Hgb >7.  [facility] will follow.      LOS: 0 days   Marquel Pottenger Radford Pax  PA-C 02/08/2021, 8:12 AM  Contact #  [phone]

## 2021-02-09 ENCOUNTER — Encounter (HOSPITAL_COMMUNITY): Payer: Self-pay | Admitting: Internal Medicine

## 2021-02-09 ENCOUNTER — Encounter (HOSPITAL_COMMUNITY)
Admission: EM | Disposition: A | Discharge status: Home / Self Care | Payer: Self-pay | Source: Home / Self Care | Attending: Internal Medicine

## 2021-02-09 ENCOUNTER — Inpatient Hospital Stay (HOSPITAL_COMMUNITY): Payer: 59 | Admitting: Anesthesiology

## 2021-02-09 DIAGNOSIS — R1084 Generalized abdominal pain: Secondary | ICD-10-CM

## 2021-02-09 DIAGNOSIS — Z8719 Personal history of other diseases of the digestive system: Secondary | ICD-10-CM

## 2021-02-09 DIAGNOSIS — K6389 Other specified diseases of intestine: Secondary | ICD-10-CM

## 2021-02-09 HISTORY — PX: SUBMUCOSAL TATTOO INJECTION: SHX6856

## 2021-02-09 HISTORY — PX: COLONOSCOPY: SHX5424

## 2021-02-09 HISTORY — PX: BIOPSY: SHX5522

## 2021-02-09 LAB — COMPREHENSIVE METABOLIC PANEL
ALT: 8 U/L (ref 0–44)
AST: 12 U/L — ABNORMAL LOW (ref 15–41)
Albumin: 3.4 g/dL — ABNORMAL LOW (ref 3.5–5.0)
Alkaline Phosphatase: 54 U/L (ref 38–126)
Anion gap: 12 (ref 5–15)
BUN: 8 mg/dL (ref 6–20)
CO2: 19 mmol/L — ABNORMAL LOW (ref 22–32)
Calcium: 8.9 mg/dL (ref 8.9–10.3)
Chloride: 104 mmol/L (ref 98–111)
Creatinine, Ser: 0.63 mg/dL (ref 0.44–1.00)
GFR, Estimated: >60 mL/min (ref >60)
Glucose, Bld: 123 mg/dL — ABNORMAL HIGH (ref 70–99)
Potassium: 3.7 mmol/L (ref 3.5–5.1)
Sodium: 135 mmol/L (ref 135–145)
Total Bilirubin: 0.9 mg/dL (ref 0.3–1.2)
Total Protein: 7.8 g/dL (ref 6.5–8.1)

## 2021-02-09 LAB — CBC
HCT: 36.4 % (ref 36.0–46.0)
Hemoglobin: 10.8 g/dL — ABNORMAL LOW (ref 12.0–15.0)
MCH: 23.9 pg — ABNORMAL LOW (ref 26.0–34.0)
MCHC: 29.7 g/dL — ABNORMAL LOW (ref 30.0–36.0)
MCV: 80.7 fL (ref 80.0–100.0)
Platelets: 335 10*3/uL (ref 150–400)
RBC: 4.51 MIL/uL (ref 3.87–5.11)
RDW: 17.2 % — ABNORMAL HIGH (ref 11.5–15.5)
WBC: 9.7 10*3/uL (ref 4.0–10.5)
nRBC: 0 % (ref 0.0–0.2)

## 2021-02-09 LAB — PREALBUMIN: Prealbumin: 9 mg/dL — ABNORMAL LOW (ref 18–38)

## 2021-02-09 SURGERY — COLONOSCOPY
Anesthesia: Monitor Anesthesia Care

## 2021-02-09 MED ORDER — BOOST / RESOURCE BREEZE PO LIQD CUSTOM
1.0000 | Freq: Two times a day (BID) | ORAL | Status: DC
  Administered 2021-02-09: 1 via ORAL

## 2021-02-09 MED ORDER — ONDANSETRON HCL 4 MG/2ML IJ SOLN
INTRAMUSCULAR | Status: DC | PRN
  Administered 2021-02-09: 4 mg via INTRAVENOUS

## 2021-02-09 MED ORDER — FENTANYL CITRATE (PF) 100 MCG/2ML IJ SOLN
INTRAMUSCULAR | Status: AC
  Filled 2021-02-09: qty 2

## 2021-02-09 MED ORDER — PROPOFOL 500 MG/50ML IV EMUL
INTRAVENOUS | Status: DC | PRN
  Administered 2021-02-09: 125 ug/kg/min via INTRAVENOUS

## 2021-02-09 MED ORDER — MUPIROCIN 2 % EX OINT
1.0000 "application " | TOPICAL_OINTMENT | Freq: Two times a day (BID) | CUTANEOUS | Status: AC
  Administered 2021-02-09 – 2021-02-14 (×9): 1 via NASAL
  Filled 2021-02-09: qty 22

## 2021-02-09 MED ORDER — BOOST / RESOURCE BREEZE PO LIQD CUSTOM: 1.0000 | Freq: Three times a day (TID) | ORAL | Status: DC

## 2021-02-09 MED ORDER — ADULT MULTIVITAMIN W/MINERALS CH
1.0000 | ORAL_TABLET | Freq: Every day | ORAL | Status: DC
  Administered 2021-02-09 – 2021-02-16 (×7): 1 via ORAL
  Filled 2021-02-09 (×7): qty 1

## 2021-02-09 MED ORDER — SPOT INK MARKER SYRINGE KIT
PACK | SUBMUCOSAL | Status: AC
  Filled 2021-02-09: qty 5

## 2021-02-09 MED ORDER — LIDOCAINE HCL 1 % IJ SOLN
INTRAMUSCULAR | Status: DC | PRN
  Administered 2021-02-09: 40 mg via INTRADERMAL

## 2021-02-09 MED ORDER — MORPHINE SULFATE (PF) 2 MG/ML IV SOLN
2.0000 mg | INTRAVENOUS | Status: DC | PRN
Start: 2021-02-09 — End: 2021-02-10
  Administered 2021-02-09 – 2021-02-10 (×4): 2 mg via INTRAVENOUS
  Filled 2021-02-09 (×4): qty 1

## 2021-02-09 MED ORDER — ONDANSETRON HCL 4 MG/2ML IJ SOLN
4.0000 mg | Freq: Four times a day (QID) | INTRAMUSCULAR | Status: DC | PRN
  Administered 2021-02-09 – 2021-02-15 (×5): 4 mg via INTRAVENOUS
  Filled 2021-02-09 (×5): qty 2

## 2021-02-09 MED ORDER — PROPOFOL 10 MG/ML IV BOLUS
INTRAVENOUS | Status: DC | PRN
  Administered 2021-02-09: 1200 ug via INTRAVENOUS
  Administered 2021-02-09 (×3): 600 ug via INTRAVENOUS

## 2021-02-09 MED ORDER — LACTATED RINGERS IV SOLN: INTRAVENOUS | Status: DC

## 2021-02-09 MED ORDER — FENTANYL CITRATE (PF) 100 MCG/2ML IJ SOLN
INTRAMUSCULAR | Status: DC | PRN
  Administered 2021-02-09 (×2): 25 ug via INTRAVENOUS
  Administered 2021-02-09: 50 ug via INTRAVENOUS

## 2021-02-09 MED ORDER — SODIUM CHLORIDE 0.9 % IV SOLN
2.0000 g | INTRAVENOUS | Status: AC
  Administered 2021-02-10: 2 g via INTRAVENOUS
  Filled 2021-02-09 (×2): qty 2

## 2021-02-09 MED ORDER — SPOT INK MARKER SYRINGE KIT
PACK | SUBMUCOSAL | Status: DC | PRN
  Administered 2021-02-09: 5 mL via SUBMUCOSAL

## 2021-02-09 MED ORDER — CHLORHEXIDINE GLUCONATE CLOTH 2 % EX PADS
6.0000 | MEDICATED_PAD | Freq: Once | CUTANEOUS | Status: AC
  Administered 2021-02-10: 6 via TOPICAL

## 2021-02-09 MED ORDER — SODIUM CHLORIDE 0.9 % IV SOLN: INTRAVENOUS | Status: DC

## 2021-02-09 MED ORDER — PANTOPRAZOLE SODIUM 40 MG IV SOLR
40.0000 mg | Freq: Two times a day (BID) | INTRAVENOUS | Status: DC
  Administered 2021-02-09 – 2021-02-16 (×13): 40 mg via INTRAVENOUS
  Filled 2021-02-09 (×13): qty 40

## 2021-02-09 NOTE — Plan of Care (Signed)
°  Problem: Clinical Measurements: Goal: Ability to maintain clinical measurements within normal limits will improve Outcome: Progressing   Problem: Clinical Measurements: Goal: Diagnostic test results will improve Outcome: Progressing   Problem: Coping: Goal: Level of anxiety will decrease Outcome: Progressing   Problem: Elimination: Goal: Will not experience complications related to bowel motility Outcome: Progressing

## 2021-02-09 NOTE — Anesthesia Postprocedure Evaluation (Signed)
Anesthesia Post Note  Patient: Firefighter  Procedure(s) Performed: COLONOSCOPY BIOPSY SUBMUCOSAL TATTOO INJECTION     Patient location during evaluation: Endoscopy Anesthesia Type: MAC Level of consciousness: awake and alert Pain management: pain level controlled Vital Signs Assessment: post-procedure vital signs reviewed and stable Respiratory status: spontaneous breathing, nonlabored ventilation, respiratory function stable and patient connected to nasal cannula oxygen Cardiovascular status: blood pressure returned to baseline and stable Postop Assessment: no apparent nausea or vomiting Anesthetic complications: no   No notable events documented.  Last Vitals:  Vitals:   02/09/21 1130 02/09/21 1140  BP: 110/67 117/74  Pulse: 95 90  Resp: 18 (!) 22  Temp:    SpO2: 97% 97%    Last Pain:  Vitals:   02/09/21 1140  TempSrc:   PainSc: 0-No pain                 Barnet Glasgow

## 2021-02-09 NOTE — Progress Notes (Signed)
Initial Nutrition Assessment  DOCUMENTATION CODES:   Not applicable  INTERVENTION:  - will increase Boost Breeze TID, each supplement provides 250 kcal and 9 grams of protein. - will order 1 tablet multivitamin with minerals/day. - diet advancement as medically feasible.    NUTRITION DIAGNOSIS:   Inadequate oral intake related to acute illness, decreased appetite, diarrhea as evidenced by per patient/family report.  GOAL:   Patient will meet greater than or equal to 90% of their needs  MONITOR:   PO intake, Supplement acceptance, Labs, Weight trends, I & O's  REASON FOR ASSESSMENT:   Malnutrition Screening Tool  ASSESSMENT:   46 y.o. female with medical history of fibroids, iron deficiency anemia, colitis, and anxiety. She presented to the ED with recurrent abdominal pain. Pain has been ongoing for several months. She was admitted in November and discharged on abx but did not complete abx course due to side effects. She informed ED staff that she has been needing stool softeners for BMs and noticed increased blood and mucus per rectum.  Patient laying in bed. Mom and aunt stopped in briefly during RD visit.  Patient was sipping on Boost Breeze prior to RD visit and and feels she will be able to continue sipping on this throughout the afternoon. She had chicken broth which had gotten cold so RD got her a new cup at the end of visit.   Patient reports that since hospitalization in November she has continued to have abdominal pain. She has found it difficult and undesirable to eat due to ongoing abdominal pain and diarrhea each time she eats. No N/V with intakes.   Recently she has mainly been consuming 1 meal/day which is mainly made up of fruits and/or vegetables. Meal is usually early afternoon. She has been limiting or avoiding meat and dairy; reports she was informed during last hospitalization that she should limit dairy and that greek yogurt would be the best choice but she  does not care for it.   Weight on 1/16 was 134 lb. Weight on 11/27/20 was 150 lb which indicates 16 lb weight loss in the past 2.5 months; significant for time frame. Weight on 07/10/20 was 155 lb which indicates 21 lb weight loss (13.5% body weight) in the past 7 months; significant for time frame.    Labs reviewed. Medications reviewed; 40 mg IV protonix BID. IVF; LR @ 75 ml/hr.    NUTRITION - FOCUSED PHYSICAL EXAM:  Completed; no muscle or fat depletions, no edema.   Diet Order:   Diet Order             Diet NPO time specified  Diet effective midnight           Diet clear liquid Room service appropriate? Yes; Fluid consistency: Thin  Diet effective now                   EDUCATION NEEDS:   Not appropriate for education at this time  Skin:  Skin Assessment: Reviewed RN Assessment  Last BM:  1/18  Height:   Ht Readings from Last 1 Encounters:  02/07/21 5\' 6"  (1.676 m)    Weight:   Wt Readings from Last 1 Encounters:  02/07/21 60.8 kg     Estimated Nutritional Needs:  Kcal:  1825-2050 kcal Protein:  90-100 grams Fluid:  >/= 2 L/day      Jarome Matin, MS, RD, LDN Inpatient Clinical Dietitian RD pager # available in [place]  After hours/weekend pager # available  in [facility]

## 2021-02-09 NOTE — Op Note (Signed)
[facility] Patient Name: Barbara Valdez Procedure Date: 02/09/2021 MRN: [id number] Attending MD: Otis Brace , MD Date of Birth: 1976/01/14 CSN: [id number] Age: 46 Admit Type: Inpatient Procedure:                Colonoscopy Indications:              Rectal bleeding, Abnormal CT of the GI tract Providers:                Otis Brace, MD, Jaci Carrel, RN, Tyna Jaksch Technician, Despina Pole, Technician,                            Karis Juba, CRNA Referring MD:              Medicines:                Sedation Administered by an Anesthesia Professional Complications:            No immediate complications. Estimated Blood Loss:     Estimated blood loss was minimal. Procedure:                Pre-Anesthesia Assessment:                           - Prior to the procedure, a History and Physical                            was performed, and patient medications and                            allergies were reviewed. The patient's tolerance of                            previous anesthesia was also reviewed. The risks                            and benefits of the procedure and the sedation                            options and risks were discussed with the patient.                            All questions were answered, and informed consent                            was obtained. Prior Anticoagulants: The patient has                            taken no previous anticoagulant or antiplatelet                            agents. ASA Grade Assessment: III - A patient with  severe systemic disease. After reviewing the risks                            and benefits, the patient was deemed in                            satisfactory condition to undergo the procedure.                           After obtaining informed consent, the colonoscope                            was passed under direct vision.  Throughout the                            procedure, the patient's blood pressure, pulse, and                            oxygen saturations were monitored continuously. The                            [id number]-HQ190L ([id number]) Olympus colonoscope was                            introduced through the anus with the intention of                            advancing to the cecum. The scope was advanced to                            the sigmoid colon before the procedure was aborted.                            Medications were given. The colonoscopy was                            performed with moderate difficulty due to a                            partially obstructing mass. Successful completion                            of the procedure was aided by withdrawing the scope                            and replacing with the 'babyscope' and applying                            abdominal pressure. The patient tolerated the                            procedure fairly well. The quality of the bowel  preparation was fair. Scope In: 10:39:58 AM Scope Out: 11:04:03 AM Total Procedure Duration: 0 hours 24 minutes 5 seconds  Findings:      The perianal and digital rectal examinations were normal.      A frond-like/villous, infiltrative and ulcerated partially obstructing       large mass was found in the distal sigmoid colon. The mass was partially       circumferential (involving two-thirds of the lumen circumference). The       mass measured four cm in length. Oozing was present. Biopsies were taken       with a cold forceps for histology. Area was tattooed with an injection       of Spot (carbon black).      A localized area of moderately congested, inflamed and thickened folds       of the mucosa was found in the recto-sigmoid colon. Biopsies were taken       with a cold forceps for histology. Area was tattooed with an injection       of 1 mL of Spot (carbon black). This area  was only seen after lavage       after placing the previous tattoo .Another tattoo was placed distal to       this area of inflammation.      The retroflexed view of the distal rectum and anal verge was normal and       showed no anal or rectal abnormalities. Impression:               - Preparation of the colon was fair.                           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 Biopsied. Tattooed.                           - Congested, inflamed and thickened folds of the                            mucosa in the recto-sigmoid colon. Biopsied.                            Tattooed. Moderate Sedation:      Moderate (conscious) sedation was personally administered by an       anesthesia professional. The following parameters were monitored: oxygen       saturation, heart rate, blood pressure, and response to care. Recommendation:           - Return patient to hospital ward for ongoing care.                           - Clear liquid diet.                           - Continue present medications. Procedure Code(s):        --- Professional ---  16109, 52, Colonoscopy, flexible; with directed                            submucosal injection(s), any substance                           45380, 14, Colonoscopy, flexible; with biopsy,                            single or multiple Diagnosis Code(s):        --- Professional ---                           D49.0, Neoplasm of unspecified behavior of                            digestive system                           K56.690, Other partial intestinal obstruction                           K52.9, Noninfective gastroenteritis and colitis,                            unspecified                           K63.89, Other specified diseases of intestine                           K62.5, Hemorrhage of anus and rectum                           R93.3, Abnormal findings on  diagnostic imaging of                            other parts of digestive tract CPT copyright 2019 [employer]. All rights reserved. The codes documented in this report are preliminary and upon coder review may  be revised to meet current compliance requirements. Otis Brace, MD Otis Brace, MD 02/09/2021 11:28:01 AM This report has been signed electronically. Number of Addenda: 0

## 2021-02-09 NOTE — Progress Notes (Signed)
Patient ID: Barbara Valdez, female   DOB: [birth year], 46 y.o.   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 Tattooed). Path is pending  Objective: Vital signs in last 24 hours: Temp:  [96.2 F (35.7 C)-99.4 F (37.4 C)] 98.7 F (37.1 C) (01/18 1348) Pulse Rate:  [87-107] 91 (01/18 1348) Resp:  [16-23] 16 (01/18 1348) BP: (101-128)/(57-98) 126/87 (01/18 1348) SpO2:  [97 %-100 %] 99 % (01/18 1348) Last BM Date: 02/09/21  Intake/Output from previous day: 01/17 0701 - 01/18 0700 In: 1801.4 [I.V.:1801.4] Out: -  Intake/Output this shift: Total I/O In: 650 [I.V.:650] Out: 5 [Blood:5]  PE: Gen:  Alert, NAD, pleasant Card:  RRR Pulm:  rate and effort normal on room air Abd: Soft, ND, +BS, no masses, hernias, or organomegaly. Mild lower abdominal TTP without rebound or guarding  Lab Results:  Recent Labs    02/08/21 0409 02/09/21 0318  WBC 7.0 9.7  HGB 9.6* 10.8*  HCT 32.0* 36.4  PLT 288 335   BMET Recent Labs    02/08/21 0409 02/09/21 0318  NA 138 135  K 3.7 3.7  CL 109 104  CO2 23 19*  GLUCOSE 92 123*  BUN 9 8  CREATININE 0.71 0.63  CALCIUM 8.6* 8.9   PT/INR No results for input(s): LABPROT, INR in the last 72 hours. CMP     Component Value Date/Time   NA 135 02/09/2021 0318   K 3.7 02/09/2021 0318   CL 104 02/09/2021 0318   CO2 19 (L) 02/09/2021 0318   GLUCOSE 123 (H) 02/09/2021 0318   BUN 8 02/09/2021 0318   CREATININE 0.63 02/09/2021 0318   CALCIUM 8.9 02/09/2021 0318   PROT 7.8 02/09/2021 0318   ALBUMIN 3.4 (L) 02/09/2021 0318   AST 12 (L) 02/09/2021 0318   ALT 8 02/09/2021 0318   ALKPHOS 54 02/09/2021 0318   BILITOT 0.9 02/09/2021 0318   GFRNONAA >60 02/09/2021 0318   GFRAA >60  06/17/2016 1145   Lipase     Component Value Date/Time   LIPASE 24 02/07/2021 1202       Studies/Results: CT ABDOMEN PELVIS W CONTRAST  Result Date: 02/07/2021 CLINICAL DATA:  Abdominal pain, cramping, constipation EXAM: CT ABDOMEN AND PELVIS WITH CONTRAST TECHNIQUE: Multidetector CT imaging of the abdomen and pelvis was performed using the standard protocol following bolus administration of intravenous contrast. RADIATION DOSE REDUCTION: This exam was performed according to the departmental dose-optimization program which includes automated exposure control, adjustment of the mA and/or kV according to patient size and/or use of iterative reconstruction technique. CONTRAST:  173m OMNIPAQUE IOHEXOL 300 MG/ML  SOLN COMPARISON:  11/27/2020 FINDINGS: Lower chest: No acute abnormality. Hepatobiliary: No solid liver abnormality is seen. 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 No gallstones, gallbladder wall thickening, or biliary dilatation. Pancreas: Unremarkable. No pancreatic ductal dilatation or surrounding inflammatory changes. Spleen: Normal in size without significant abnormality. Adrenals/Urinary Tract: Adrenal glands are unremarkable. Kidneys are normal, without renal calculi, solid lesion, or hydronephrosis. Bladder is unremarkable. Stomach/Bowel: Stomach is within normal limits. Appendix appears normal. Large burden of stool throughout the extremely redundant proximal colon. There is severe, circumferential wall thickening and mucosal hyperenhancement involving the distal sigmoid colon  and rectum to the anus, generally tethered appearance unchanged, involving at least 15-20 cm of distal sigmoid and rectum (series 2, image 65, series 4, image 60). There is again seen an abscess at the medial aspect of the rectosigmoid junction measuring approximately 3.0 x 1.6 cm (series 2, image 65).  Vascular/Lymphatic: No significant vascular findings are present. Enlarged left external iliac lymph nodes measuring up to 1.1 x 0.9 cm (series 2, image 56) Reproductive: No mass or other significant abnormality. Other: No abdominal wall hernia or abnormality. No ascites. Musculoskeletal: No acute or significant osseous findings. IMPRESSION: 1. There is severe, circumferential wall thickening and mucosal hyperenhancement involving the distal sigmoid colon and rectum to the anus, generally tethered appearance unchanged, involving at least 15-20 cm of distal sigmoid and rectum. 2. There is again seen an abscess at the medial aspect of the rectosigmoid junction measuring approximately 3.0 x 1.6 cm. 3. Findings are generally consistent with nonspecific infectious, inflammatory, or ischemic colitis, including inflammatory bowel disease such as Crohn's disease or ulcerative colitis; however an underlying malignancy is very difficult to exclude, particularly given substantial wall thickening. 4. Large burden of stool throughout the extremely redundant proximal colon, likely reflecting some degree of obstipation. 5. Enlarged left external iliac lymph nodes measuring up to 1.1 x 0.9 cm fall, nonspecific, possibly reactive, however nodal metastatic disease is not excluded per above. Electronically Signed   By: Delanna Ahmadi M.D.   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 Tattooed). Path is pending - CEA pending. Will also need CT chest to evaluate for any metastatic disease - Recommend partial colectomy/ colostomy this admission. Patient agreeable to proceed. Will plan for surgery tomorrow.  [facility] for clear liquids today, NPO after midnight. Will ask WOC to see for stoma marking.   FEN - IVF, CLD, NPO after MN VTE - SCDs, okay for chemical propylaxis from a general surgery standpoint ID - none  Moderate Medical Decision Making  Anemia Malnutrition - prealbumin pending   LOS: 1 day    Wellington Hampshire, [facility] Surgery 02/09/2021, 2:15 PM Please see Amion for pager number during day hours 7:00am-4:30pm

## 2021-02-09 NOTE — Consult Note (Signed)
[place] [facility] Nurse requested for preoperative stoma site marking  Discussed surgical procedure and stoma creation with patient and family.  Explained role of the [facility] nurse team.  Provided the patient with educational booklet and provided samples of pouching options.  Answered patient and family questions.   Examined patient sitting, and standing in order to place the marking in the patient's visual field, away from any creases or abdominal contour issues and within the rectus muscle.  Attempted to mark above the patient's belt line.   Marked for colostomy in the LUQ  __5__ cm to the left of the umbilicus and even with the umbilicus.  Patient's abdomen cleansed with CHG wipes at site markings, allowed to air dry prior to marking.Covered mark with thin film transparent dressing to preserve mark until date of surgery.   [place] Nurse team will follow up with patient after surgery for continue ostomy care and teaching.  Val Riles, RN, MSN, CWOCN, CNS-BC, pager [phone]

## 2021-02-09 NOTE — Brief Op Note (Signed)
02/07/2021 - 02/09/2021  11:28 AM  PATIENT:  Barbara Valdez  46 y.o. female  PRE-OPERATIVE DIAGNOSIS:  Colitis, abnormal CT  POST-OPERATIVE DIAGNOSIS:  sigmoid colon mass biopsy r/o malignancy, rectal sigmoid biopsy for inflammation, sigmoid and rectal sigmoid colon tattooing   PROCEDURE:  Procedure(s): COLONOSCOPY (N/A) BIOPSY SUBMUCOSAL TATTOO INJECTION  SURGEON:  Surgeon(s) and Role:    * Breckin Zafar, MD - Primary  Findings ----------- -Colonoscopy showed partially obstructing masslike lesion in the distal sigmoid colon at around 25 cm.  Not able to advance scope beyond this area despite of changing scope to ultraslim.  Biopsies taken.  Tattoo was performed.  -There was another area at the rectosigmoid junction at around 20 cm which looked inflamed with purulent discharge.  Biopsies were taken.  Another tattoo was performed distal to this area.  Recommendation ----------------------- -Follow pathology -She will likely need surgery with partial sigmoid/rectosigmoid colectomy -Clear liquids diet today -Check CEA  -GI will follow  Otis Brace MD, FACP 02/09/2021, 11:30 AM  Contact #  [phone]

## 2021-02-09 NOTE — Progress Notes (Signed)
PROGRESS NOTE    Barbara Valdez  DGL:[id number] DOB: 09-09-1975 DOA: 02/07/2021 PCP: Pcp, No     Brief Narrative:  45 y.o. BF PMHx fibroids, iron deficiency anemia, colitis, anxiety   Presents with recurrent abdominal pain.  Recent admission November 2022 discharged on antibiotics but did not complete the course due to side effects of nausea, has been attempting to have outpatient colonoscopy for the past 3 months with multiple delays in the setting of insurance and scheduling.    Presents to the ED on 02/07/2021 with profoundly worsening abdominal pain, imaging concerning for colitis.   Subjective: Patient understandably upset concerning her preliminary diagnosis of cancer/abscess.  Only complaint is of abdominal pain, nausea, BRBPR.   Assessment & Plan: Covid vaccination;   Principal Problem:   Colitis Active Problems:   Iron deficiency anemia  SBO with rectosigmoid junction abscess/cancer? - Discussed case with Dr. Alessandra Bevels GI who performed colonoscopy, and feels that the mass like lesion in the distal sigmoid colon most likely cancerous.  His team is already contacted general surgery. -1/18 surgery recommends; partial colectomy/ colostomy this admission.  Have scheduled for 1/19  Acute on chronic recurrent colitis POA/Acute abdominal pain -GI following,  -Multiple delays outpatient setting due to scheduling, poor compliance with bowel prep and insurance issues -1/18 colonoscopy reveals most likely cancerous mass see SBO  -Continue antibiotics per GI   Anemia, microcytic -Chronic questionably anemia of chronic disease and malabsorption given above -1/18 anemia panel pending     DVT prophylaxis: SCD Code Status: Full Family Communication:  Status is: Inpatient    Dispo: The patient is from: Home              Anticipated d/c is to:                 Anticipated d/c date is: > 3 days              Patient currently is not medically stable to  d/c.      Consultants:  GI General surgery    Procedures/Significant Events:  1/18 Colonoscopy: A frond-like/villous, infiltrative and ulcerated partially obstructing large mass was found in the distal sigmoid colon. The mass was partially circumferential (involving two-thirds of the lumen circumference). The mass measured four cm in length. Oozing was present. Biopsies were taken with a cold forceps for histology. Area was tattooed with an injection of Spot (carbon black). A localized area of moderately congested, inflamed and thickened folds of the mucosa was found in the recto-sigmoid colon. Biopsies were taken with a cold forceps for histology.    I have personally reviewed and interpreted all radiology studies and my findings are as above.  VENTILATOR SETTINGS:    Cultures 1/18 colonic biopsy, pathology pending  Antimicrobials: Anti-infectives (From admission, onward)    Start     Ordered Stop   02/10/21 0600  cefoTEtan (CEFOTAN) 2 g in sodium chloride 0.9 % 100 mL IVPB        02/09/21 1431 02/11/21 0559         Devices    LINES / TUBES:      Continuous Infusions:   Objective: Vitals:   02/08/21 1614 02/08/21 1659 02/08/21 2152 02/09/21 0600  BP: 110/71 112/75 117/85 118/83  Pulse: (!) 103 (!) 101 (!) 107 99  Resp: 20 17 16 16   Temp: 99.4 F (37.4 C) 98.6 F (37 C) 98.8 F (37.1 C) 99.4 F (37.4 C)  TempSrc: Oral Oral Oral Oral  SpO2: 98%  100% 100% 99%  Weight:      Height:        Intake/Output Summary (Last 24 hours) at 02/09/2021 0736 Last data filed at 02/08/2021 1613 Gross per 24 hour  Intake 1801.35 ml  Output --  Net 1801.35 ml   Filed Weights   02/07/21 1142  Weight: 60.8 kg    Examination:  General: A/O x4, No acute respiratory distress Eyes: negative scleral hemorrhage, negative anisocoria, negative icterus ENT: Negative Runny nose, negative gingival bleeding, Neck:  Negative scars, masses, torticollis,  lymphadenopathy, JVD Lungs: Clear to auscultation bilaterally without wheezes or crackles Cardiovascular: Regular rate and rhythm without murmur gallop or rub normal S1 and S2 Abdomen: Positive abdominal pain epigastric with palpation, nondistended, negative bowel sounds, no rebound, no ascites, no appreciable mass Extremities: No significant cyanosis, clubbing, or edema bilateral lower extremities Skin: Negative rashes, lesions, ulcers Psychiatric:  Negative depression, negative anxiety, negative fatigue, negative mania  Central nervous system:  Cranial nerves II through XII intact, tongue/uvula midline, all extremities muscle strength 5/5, sensation intact throughout, negative dysarthria, negative expressive aphasia, negative receptive aphasia.  .     Data Reviewed: Care during the described time interval was provided by me .  I have reviewed this patient's available data, including medical history, events of note, physical examination, and all test results as part of my evaluation.  CBC: Recent Labs  Lab 02/07/21 1202 02/08/21 0409 02/09/21 0318  WBC 7.5 7.0 9.7  HGB 11.4* 9.6* 10.8*  HCT 38.3 32.0* 36.4  MCV 79.3* 79.6* 80.7  PLT 389 288 342   Basic Metabolic Panel: Recent Labs  Lab 02/07/21 1202 02/08/21 0409 02/09/21 0318  NA 137 138 135  K 3.8 3.7 3.7  CL 106 109 104  CO2 26 23 19*  GLUCOSE 109* 92 123*  BUN 10 9 8   CREATININE 0.71 0.71 0.63  CALCIUM 9.2 8.6* 8.9   GFR: Estimated Creatinine Clearance: 84 mL/min (by C-G formula based on SCr of 0.63 mg/dL). Liver Function Tests: Recent Labs  Lab 02/07/21 1202 02/08/21 0409 02/09/21 0318  AST 14* 10* 12*  ALT 9 8 8   ALKPHOS 61 49 54  BILITOT 0.6 0.6 0.9  PROT 8.6* 6.5 7.8  ALBUMIN 3.9 2.9* 3.4*   Recent Labs  Lab 02/07/21 1202  LIPASE 24   No results for input(s): AMMONIA in the last 168 hours. Coagulation Profile: No results for input(s): INR, PROTIME in the last 168 hours. Cardiac Enzymes: No  results for input(s): CKTOTAL, CKMB, CKMBINDEX, TROPONINI in the last 168 hours. BNP (last 3 results) No results for input(s): PROBNP in the last 8760 hours. HbA1C: No results for input(s): HGBA1C in the last 72 hours. CBG: No results for input(s): GLUCAP in the last 168 hours. Lipid Profile: No results for input(s): CHOL, HDL, LDLCALC, TRIG, CHOLHDL, LDLDIRECT in the last 72 hours. Thyroid Function Tests: No results for input(s): TSH, T4TOTAL, FREET4, T3FREE, THYROIDAB in the last 72 hours. Anemia Panel: No results for input(s): VITAMINB12, FOLATE, FERRITIN, TIBC, IRON, RETICCTPCT in the last 72 hours. Sepsis Labs: No results for input(s): PROCALCITON, LATICACIDVEN in the last 168 hours.  Recent Results (from the past 240 hour(s))  Resp Panel by RT-PCR (Flu A&B, Covid) Nasopharyngeal Swab     Status: None   Collection Time: 02/08/21  8:50 AM   Specimen: Nasopharyngeal Swab; Nasopharyngeal(NP) swabs in vial transport medium  Result Value Ref Range Status   SARS Coronavirus 2 by RT PCR NEGATIVE NEGATIVE Final  Comment: (NOTE) SARS-CoV-2 target nucleic acids are NOT DETECTED.  The SARS-CoV-2 RNA is generally detectable in upper respiratory specimens during the acute phase of infection. The lowest concentration of SARS-CoV-2 viral copies this assay can detect is 138 copies/mL. A negative result does not preclude SARS-Cov-2 infection and should not be used as the sole basis for treatment or other patient management decisions. A negative result may occur with  improper specimen collection/handling, submission of specimen other than nasopharyngeal swab, presence of viral mutation(s) within the areas targeted by this assay, and inadequate number of viral copies(<138 copies/mL). A negative result must be combined with clinical observations, patient history, and epidemiological information. The expected result is Negative.  Fact Sheet for Patients:   EntrepreneurPulse.com.au  Fact Sheet for Healthcare Providers:  IncredibleEmployment.be  This test is no t yet approved or cleared by the [place] FDA and  has been authorized for detection and/or diagnosis of SARS-CoV-2 by FDA under an Emergency Use Authorization (EUA). This EUA will remain  in effect (meaning this test can be used) for the duration of the COVID-19 declaration under Section 564(b)(1) of the Act, 21 U.S.C.section 360bbb-3(b)(1), unless the authorization is terminated  or revoked sooner.       Influenza A by PCR NEGATIVE NEGATIVE Final   Influenza B by PCR NEGATIVE NEGATIVE Final    Comment: (NOTE) The Xpert Xpress SARS-CoV-2/FLU/RSV plus assay is intended as an aid in the diagnosis of influenza from Nasopharyngeal swab specimens and should not be used as a sole basis for treatment. Nasal washings and aspirates are unacceptable for Xpert Xpress SARS-CoV-2/FLU/RSV testing.  Fact Sheet for Patients: EntrepreneurPulse.com.au  Fact Sheet for Healthcare Providers: IncredibleEmployment.be  This test is not yet approved or cleared by the [place] FDA and has been authorized for detection and/or diagnosis of SARS-CoV-2 by FDA under an Emergency Use Authorization (EUA). This EUA will remain in effect (meaning this test can be used) for the duration of the COVID-19 declaration under Section 564(b)(1) of the Act, 21 U.S.C. section 360bbb-3(b)(1), unless the authorization is terminated or revoked.  Performed at [facility], [place] [address], [place], [place] [address]          Radiology Studies: CT ABDOMEN PELVIS W CONTRAST  Result Date: 02/07/2021 CLINICAL DATA:  Abdominal pain, cramping, constipation EXAM: CT ABDOMEN AND PELVIS WITH CONTRAST TECHNIQUE: Multidetector CT imaging of the abdomen and pelvis was performed using the standard protocol following bolus  administration of intravenous contrast. RADIATION DOSE REDUCTION: This exam was performed according to the departmental dose-optimization program which includes automated exposure control, adjustment of the mA and/or kV according to patient size and/or use of iterative reconstruction technique. CONTRAST:  15m OMNIPAQUE IOHEXOL 300 MG/ML  SOLN COMPARISON:  11/27/2020 FINDINGS: Lower chest: No acute abnormality. Hepatobiliary: No solid liver abnormality is seen. 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 No gallstones, gallbladder wall thickening, or biliary dilatation. Pancreas: Unremarkable. No pancreatic ductal dilatation or surrounding inflammatory changes. Spleen: Normal in size without significant abnormality. Adrenals/Urinary Tract: Adrenal glands are unremarkable. Kidneys are normal, without renal calculi, solid lesion, or hydronephrosis. Bladder is unremarkable. Stomach/Bowel: Stomach is within normal limits. Appendix appears normal. Large burden of stool throughout the extremely redundant proximal colon. There is severe, circumferential wall thickening and mucosal hyperenhancement involving the distal sigmoid colon and rectum to the anus, generally tethered appearance unchanged, involving at least 15-20 cm of distal sigmoid  and rectum (series 2, image 65, series 4, image 60). There is again seen an abscess at the medial aspect of the rectosigmoid junction measuring approximately 3.0 x 1.6 cm (series 2, image 65). Vascular/Lymphatic: No significant vascular findings are present. Enlarged left external iliac lymph nodes measuring up to 1.1 x 0.9 cm (series 2, image 56) Reproductive: No mass or other significant abnormality. Other: No abdominal wall hernia or abnormality. No ascites. Musculoskeletal: No acute or significant osseous findings. IMPRESSION: 1. There is severe, circumferential wall thickening and mucosal  hyperenhancement involving the distal sigmoid colon and rectum to the anus, generally tethered appearance unchanged, involving at least 15-20 cm of distal sigmoid and rectum. 2. There is again seen an abscess at the medial aspect of the rectosigmoid junction measuring approximately 3.0 x 1.6 cm. 3. Findings are generally consistent with nonspecific infectious, inflammatory, or ischemic colitis, including inflammatory bowel disease such as Crohn's disease or ulcerative colitis; however an underlying malignancy is very difficult to exclude, particularly given substantial wall thickening. 4. Large burden of stool throughout the extremely redundant proximal colon, likely reflecting some degree of obstipation. 5. Enlarged left external iliac lymph nodes measuring up to 1.1 x 0.9 cm fall, nonspecific, possibly reactive, however nodal metastatic disease is not excluded per above. Electronically Signed   By: Delanna Ahmadi M.D.   On: 02/07/2021 15:31        Scheduled Meds:  sodium chloride flush  3 mL Intravenous Q12H   Continuous Infusions:   LOS: 1 day    Time spent:40 min    Kishan [place], Geraldo Docker, MD [facility]   If 7PM-7AM, please contact night-coverage 02/09/2021, 7:36 AM

## 2021-02-09 NOTE — Transfer of Care (Signed)
Immediate Anesthesia Transfer of Care Note  Patient: Barbara Valdez  Procedure(s) Performed: COLONOSCOPY BIOPSY SUBMUCOSAL TATTOO INJECTION  Patient Location: PACU and Endoscopy Unit  Anesthesia Type:MAC  Level of Consciousness: awake, alert , oriented and patient cooperative  Airway & Oxygen Therapy: Patient Spontanous Breathing and Patient connected to face mask oxygen  Post-op Assessment: Report given to RN and Post -op Vital signs reviewed and stable  Post vital signs: Reviewed and stable  Last Vitals:  Vitals Value Taken Time  BP    Temp    Pulse 86 02/09/21 1117  Resp 22 02/09/21 1117  SpO2 100 % 02/09/21 1117  Vitals shown include unvalidated device data.  Last Pain:  Vitals:   02/09/21 0942  TempSrc: Temporal  PainSc: 0-No pain         Complications: No notable events documented.

## 2021-02-09 NOTE — Progress Notes (Addendum)
Colonoscopy scheduled today.  Will follow up on results post scope.  Barbara Valdez 9:06 AM 02/09/2021

## 2021-02-09 NOTE — Progress Notes (Signed)
[facility] Progress Note  Barbara Valdez 46 y.o. 07-Jul-1975  CC:   Abdominal pain, rectal bleeding   Subjective: Patient seen and examined at bedside.  She had some vomiting with the colonoscopy prep but she completed her prep.  Having some mild abdominal discomfort.    Objective: Vital signs in last 24 hours: Vitals:   02/09/21 0600 02/09/21 0942  BP: 118/83 128/86  Pulse: 99 (!) 101  Resp: 16 (!) 23  Temp: 99.4 F (37.4 C) 98.1 F (36.7 C)  SpO2: 99% 100%    Physical Exam:  General:  Alert, cooperative, no distress, appears stated age  Head:  Normocephalic, without obvious abnormality, atraumatic  Eyes:  , EOM's intact,   Lungs:   Clear to auscultation bilaterally, respirations unlabored  Heart:  Regular rate and rhythm, S1, S2 normal  Abdomen:   Soft, non-tender, generalized discomfort without any tenderness, bowel sound present  Extremities: Extremities normal, atraumatic, no  edema  Pulses: 2+ and symmetric    Lab Results: Recent Labs    02/08/21 0409 02/09/21 0318  NA 138 135  K 3.7 3.7  CL 109 104  CO2 23 19*  GLUCOSE 92 123*  BUN 9 8  CREATININE 0.71 0.63  CALCIUM 8.6* 8.9   Recent Labs    02/08/21 0409 02/09/21 0318  AST 10* 12*  ALT 8 8  ALKPHOS 49 54  BILITOT 0.6 0.9  PROT 6.5 7.8  ALBUMIN 2.9* 3.4*   Recent Labs    02/08/21 0409 02/09/21 0318  WBC 7.0 9.7  HGB 9.6* 10.8*  HCT 32.0* 36.4  MCV 79.6* 80.7  PLT 288 335   No results for input(s): LABPROT, INR in the last 72 hours.    Assessment/Plan: Assessment ----------------- -46 year old patient with abdominal pain, diarrhea and mucus in the stool with intermittent rectal bleeding.  CT scan concerning for colitis and questionable abscess. -Anemia   Recommendations ------------------------- -Proceed with colonoscopy today   Risks (bleeding, infection, bowel perforation that could require surgery, sedation-related changes in cardiopulmonary systems), benefits  (identification and possible treatment of source of symptoms, exclusion of certain causes of symptoms), and alternatives (watchful waiting, radiographic imaging studies, empiric medical treatment)  were explained to patient in detail and patient wishes to proceed.    Otis Brace MD, [place] 02/09/2021, 9:49 AM  Contact #  [phone]

## 2021-02-09 NOTE — Anesthesia Preprocedure Evaluation (Addendum)
Anesthesia Evaluation  Patient identified by MRN, date of birth, ID band  Reviewed: Allergy & Precautions, NPO status , Patient's Chart, lab work & pertinent test results  Airway Mallampati: II  TM Distance: >3 FB Neck ROM: Full    Dental no notable dental hx. (+) Dental Advisory Given, Teeth Intact   Pulmonary neg pulmonary ROS,    Pulmonary exam normal breath sounds clear to auscultation       Cardiovascular Exercise Tolerance: Good Normal cardiovascular exam Rhythm:Regular Rate:Normal     Neuro/Psych Anxiety negative neurological ROS     GI/Hepatic Neg liver ROS, Distal sigmoid colon mass   Endo/Other  negative endocrine ROS  Renal/GU negative Renal ROSLab Results      Component                Value               Date                      CREATININE               0.60                02/10/2021                BUN                      9                   02/10/2021                      K                        2.8 (L)             02/10/2021                      Musculoskeletal negative musculoskeletal ROS (+)   Abdominal   Peds  Hematology  (+) anemia , Lab Results      Component                Value               Date                            HGB                      10.8 (L)            02/09/2021                HCT                      36.4                02/09/2021                 PLT                      335                 02/09/2021           Lab Results      Component  Value               Date                          HGB                      10.6 (L)            02/10/2021                HCT                      34.9 (L)            02/10/2021                PLT                      375                 02/10/2021              Anesthesia Other Findings   Reproductive/Obstetrics                           Anesthesia Physical Anesthesia Plan  ASA: 2  Anesthesia  Plan: General   Post-op Pain Management: Lidocaine infusion and Ketamine IV   Induction: Intravenous  PONV Risk Score and Plan: 4 or greater and Treatment may vary due to age or medical condition, Midazolam, Dexamethasone and Ondansetron  Airway Management Planned: Oral ETT  Additional Equipment: None  Intra-op Plan:   Post-operative Plan: Extubation in OR  Informed Consent: I have reviewed the patients History and Physical, chart, labs and discussed the procedure including the risks, benefits and alternatives for the proposed anesthesia with the patient or authorized representative who has indicated his/her understanding and acceptance.     Dental advisory given  Plan Discussed with: CRNA and Anesthesiologist  Anesthesia Plan Comments:        Anesthesia Quick Evaluation

## 2021-02-09 NOTE — Plan of Care (Signed)

## 2021-02-09 NOTE — H&P (View-Only) (Signed)
Patient ID: Barbara Valdez, female   DOB: Jul 02, 1975, 46 y.o.   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 Tattooed). Path is pending  Objective: Vital signs in last 24 hours: Temp:  [96.2 F (35.7 C)-99.4 F (37.4 C)] 98.7 F (37.1 C) (01/18 1348) Pulse Rate:  [87-107] 91 (01/18 1348) Resp:  [16-23] 16 (01/18 1348) BP: (101-128)/(57-98) 126/87 (01/18 1348) SpO2:  [97 %-100 %] 99 % (01/18 1348) Last BM Date: 02/09/21  Intake/Output from previous day: 01/17 0701 - 01/18 0700 In: 1801.4 [I.V.:1801.4] Out: -  Intake/Output this shift: Total I/O In: 650 [I.V.:650] Out: 5 [Blood:5]  PE: Gen:  Alert, NAD, pleasant Card:  RRR Pulm:  rate and effort normal on room air Abd: Soft, ND, +BS, no masses, hernias, or organomegaly. Mild lower abdominal TTP without rebound or guarding  Lab Results:  Recent Labs    02/08/21 0409 02/09/21 0318  WBC 7.0 9.7  HGB 9.6* 10.8*  HCT 32.0* 36.4  PLT 288 335   BMET Recent Labs    02/08/21 0409 02/09/21 0318  NA 138 135  K 3.7 3.7  CL 109 104  CO2 23 19*  GLUCOSE 92 123*  BUN 9 8  CREATININE 0.71 0.63  CALCIUM 8.6* 8.9   PT/INR No results for input(s): LABPROT, INR in the last 72 hours. CMP     Component Value Date/Time   NA 135 02/09/2021 0318   K 3.7 02/09/2021 0318   CL 104 02/09/2021 0318   CO2 19 (L) 02/09/2021 0318   GLUCOSE 123 (H) 02/09/2021 0318   BUN 8 02/09/2021 0318   CREATININE 0.63 02/09/2021 0318   CALCIUM 8.9 02/09/2021 0318   PROT 7.8 02/09/2021 0318   ALBUMIN 3.4 (L) 02/09/2021 0318   AST 12 (L) 02/09/2021 0318   ALT 8 02/09/2021 0318   ALKPHOS 54 02/09/2021 0318   BILITOT 0.9 02/09/2021 0318   GFRNONAA >60 02/09/2021 0318   GFRAA >60  06/17/2016 1145   Lipase     Component Value Date/Time   LIPASE 24 02/07/2021 1202       Studies/Results: CT ABDOMEN PELVIS W CONTRAST  Result Date: 02/07/2021 CLINICAL DATA:  Abdominal pain, cramping, constipation EXAM: CT ABDOMEN AND PELVIS WITH CONTRAST TECHNIQUE: Multidetector CT imaging of the abdomen and pelvis was performed using the standard protocol following bolus administration of intravenous contrast. RADIATION DOSE REDUCTION: This exam was performed according to the departmental dose-optimization program which includes automated exposure control, adjustment of the mA and/or kV according to patient size and/or use of iterative reconstruction technique. CONTRAST:  175m OMNIPAQUE IOHEXOL 300 MG/ML  SOLN COMPARISON:  11/27/2020 FINDINGS: Lower chest: No acute abnormality. Hepatobiliary: No solid liver abnormality is seen. 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 No gallstones, gallbladder wall thickening, or biliary dilatation. Pancreas: Unremarkable. No pancreatic ductal dilatation or surrounding inflammatory changes. Spleen: Normal in size without significant abnormality. Adrenals/Urinary Tract: Adrenal glands are unremarkable. Kidneys are normal, without renal calculi, solid lesion, or hydronephrosis. Bladder is unremarkable. Stomach/Bowel: Stomach is within normal limits. Appendix appears normal. Large burden of stool throughout the extremely redundant proximal colon. There is severe, circumferential wall thickening and mucosal hyperenhancement involving the distal sigmoid colon  and rectum to the anus, generally tethered appearance unchanged, involving at least 15-20 cm of distal sigmoid and rectum (series 2, image 65, series 4, image 60). There is again seen an abscess at the medial aspect of the rectosigmoid junction measuring approximately 3.0 x 1.6 cm (series 2, image 65).  Vascular/Lymphatic: No significant vascular findings are present. Enlarged left external iliac lymph nodes measuring up to 1.1 x 0.9 cm (series 2, image 56) Reproductive: No mass or other significant abnormality. Other: No abdominal wall hernia or abnormality. No ascites. Musculoskeletal: No acute or significant osseous findings. IMPRESSION: 1. There is severe, circumferential wall thickening and mucosal hyperenhancement involving the distal sigmoid colon and rectum to the anus, generally tethered appearance unchanged, involving at least 15-20 cm of distal sigmoid and rectum. 2. There is again seen an abscess at the medial aspect of the rectosigmoid junction measuring approximately 3.0 x 1.6 cm. 3. Findings are generally consistent with nonspecific infectious, inflammatory, or ischemic colitis, including inflammatory bowel disease such as Crohn's disease or ulcerative colitis; however an underlying malignancy is very difficult to exclude, particularly given substantial wall thickening. 4. Large burden of stool throughout the extremely redundant proximal colon, likely reflecting some degree of obstipation. 5. Enlarged left external iliac lymph nodes measuring up to 1.1 x 0.9 cm fall, nonspecific, possibly reactive, however nodal metastatic disease is not excluded per above. Electronically Signed   By: Delanna Ahmadi M.D.   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 Tattooed). Path is pending - CEA pending. Will also need CT chest to evaluate for any metastatic disease - Recommend partial colectomy/ colostomy this admission. Patient agreeable to proceed. Will plan for surgery tomorrow.  [place] for clear liquids today, NPO after midnight. Will ask WOC to see for stoma marking.   FEN - IVF, CLD, NPO after MN VTE - SCDs, okay for chemical propylaxis from a general surgery standpoint ID - none  Moderate Medical Decision Making  Anemia Malnutrition - prealbumin pending   LOS: 1 day    Wellington Hampshire, [facility] 02/09/2021, 2:15 PM Please see Amion for pager number during day hours 7:00am-4:30pm

## 2021-02-10 ENCOUNTER — Inpatient Hospital Stay (HOSPITAL_COMMUNITY): Payer: 59 | Admitting: Anesthesiology

## 2021-02-10 ENCOUNTER — Encounter (HOSPITAL_COMMUNITY)
Admission: EM | Disposition: A | Discharge status: Home / Self Care | Payer: Self-pay | Source: Home / Self Care | Attending: Internal Medicine

## 2021-02-10 ENCOUNTER — Encounter (HOSPITAL_COMMUNITY): Payer: Self-pay | Admitting: Internal Medicine

## 2021-02-10 HISTORY — PX: LAPAROSCOPIC PARTIAL COLECTOMY: SHX5907

## 2021-02-10 LAB — CBC WITH DIFFERENTIAL/PLATELET
Abs Immature Granulocytes: 0.06 10*3/uL (ref 0.00–0.07)
Basophils Absolute: 0 10*3/uL (ref 0.0–0.1)
Basophils Relative: 0 %
Eosinophils Absolute: 0 10*3/uL (ref 0.0–0.5)
Eosinophils Relative: 0 %
HCT: 34.9 % — ABNORMAL LOW (ref 36.0–46.0)
Hemoglobin: 10.6 g/dL — ABNORMAL LOW (ref 12.0–15.0)
Immature Granulocytes: 1 %
Lymphocytes Relative: 9 %
Lymphs Abs: 0.7 10*3/uL (ref 0.7–4.0)
MCH: 24.1 pg — ABNORMAL LOW (ref 26.0–34.0)
MCHC: 30.4 g/dL (ref 30.0–36.0)
MCV: 79.3 fL — ABNORMAL LOW (ref 80.0–100.0)
Monocytes Absolute: 0.5 10*3/uL (ref 0.1–1.0)
Monocytes Relative: 7 %
Neutro Abs: 6.6 10*3/uL (ref 1.7–7.7)
Neutrophils Relative %: 83 %
Platelets: 375 10*3/uL (ref 150–400)
RBC: 4.4 MIL/uL (ref 3.87–5.11)
RDW: 17.3 % — ABNORMAL HIGH (ref 11.5–15.5)
WBC: 7.9 10*3/uL (ref 4.0–10.5)
nRBC: 0 % (ref 0.0–0.2)

## 2021-02-10 LAB — RETICULOCYTES
Immature Retic Fract: 13.7 % (ref 2.3–15.9)
RBC.: 4.33 MIL/uL (ref 3.87–5.11)
Retic Count, Absolute: 49.4 10*3/uL (ref 19.0–186.0)
Retic Ct Pct: 1.1 % (ref 0.4–3.1)

## 2021-02-10 LAB — COMPREHENSIVE METABOLIC PANEL
ALT: 9 U/L (ref 0–44)
AST: 11 U/L — ABNORMAL LOW (ref 15–41)
Albumin: 3.2 g/dL — ABNORMAL LOW (ref 3.5–5.0)
Alkaline Phosphatase: 57 U/L (ref 38–126)
Anion gap: 11 (ref 5–15)
BUN: 9 mg/dL (ref 6–20)
CO2: 23 mmol/L (ref 22–32)
Calcium: 9.2 mg/dL (ref 8.9–10.3)
Chloride: 105 mmol/L (ref 98–111)
Creatinine, Ser: 0.6 mg/dL (ref 0.44–1.00)
GFR, Estimated: >60 mL/min (ref >60)
Glucose, Bld: 129 mg/dL — ABNORMAL HIGH (ref 70–99)
Potassium: 2.8 mmol/L — ABNORMAL LOW (ref 3.5–5.1)
Sodium: 139 mmol/L (ref 135–145)
Total Bilirubin: 0.4 mg/dL (ref 0.3–1.2)
Total Protein: 7.4 g/dL (ref 6.5–8.1)

## 2021-02-10 LAB — FERRITIN: Ferritin: 140 ng/mL (ref 11–307)

## 2021-02-10 LAB — IRON AND TIBC
Iron: 26 ug/dL — ABNORMAL LOW (ref 28–170)
Saturation Ratios: 12 % (ref 10.4–31.8)
TIBC: 215 ug/dL — ABNORMAL LOW (ref 250–450)
UIBC: 189 ug/dL

## 2021-02-10 LAB — BASIC METABOLIC PANEL
Anion gap: 10 (ref 5–15)
BUN: 10 mg/dL (ref 6–20)
CO2: 24 mmol/L (ref 22–32)
Calcium: 8.5 mg/dL — ABNORMAL LOW (ref 8.9–10.3)
Chloride: 106 mmol/L (ref 98–111)
Creatinine, Ser: 0.62 mg/dL (ref 0.44–1.00)
GFR, Estimated: >60 mL/min (ref >60)
Glucose, Bld: 151 mg/dL — ABNORMAL HIGH (ref 70–99)
Potassium: 3.5 mmol/L (ref 3.5–5.1)
Sodium: 140 mmol/L (ref 135–145)

## 2021-02-10 LAB — PHOSPHORUS: Phosphorus: 3.9 mg/dL (ref 2.5–4.6)

## 2021-02-10 LAB — SURGICAL PCR SCREEN
MRSA, PCR: NEGATIVE
Staphylococcus aureus: NEGATIVE

## 2021-02-10 LAB — VITAMIN B12: Vitamin B-12: 1140 pg/mL — ABNORMAL HIGH (ref 180–914)

## 2021-02-10 LAB — FOLATE: Folate: 36.9 ng/mL (ref >5.9)

## 2021-02-10 LAB — MAGNESIUM: Magnesium: 2.2 mg/dL (ref 1.7–2.4)

## 2021-02-10 LAB — SURGICAL PATHOLOGY

## 2021-02-10 LAB — CEA: CEA: 16.8 ng/mL — ABNORMAL HIGH (ref 0.0–4.7)

## 2021-02-10 SURGERY — LAPAROSCOPIC PARTIAL COLECTOMY
Anesthesia: General | Site: Abdomen

## 2021-02-10 MED ORDER — LACTATED RINGERS IV SOLN: INTRAVENOUS | Status: DC | PRN

## 2021-02-10 MED ORDER — LIDOCAINE HCL (CARDIAC) PF 100 MG/5ML IV SOSY
PREFILLED_SYRINGE | INTRAVENOUS | Status: DC | PRN
Start: 2021-02-10 — End: 2021-02-10
  Administered 2021-02-10: 100 mg via INTRAVENOUS

## 2021-02-10 MED ORDER — DEXAMETHASONE SODIUM PHOSPHATE 10 MG/ML IJ SOLN
INTRAMUSCULAR | Status: DC | PRN
  Administered 2021-02-10: 8 mg via INTRAVENOUS

## 2021-02-10 MED ORDER — ALBUMIN HUMAN 5 % IV SOLN
INTRAVENOUS | Status: AC
  Filled 2021-02-10: qty 250

## 2021-02-10 MED ORDER — OXYCODONE HCL 5 MG PO TABS
ORAL_TABLET | ORAL | Status: AC
  Administered 2021-02-10: 5 mg via ORAL
  Filled 2021-02-10: qty 1

## 2021-02-10 MED ORDER — ROCURONIUM BROMIDE 100 MG/10ML IV SOLN
INTRAVENOUS | Status: DC | PRN
  Administered 2021-02-10 (×2): 50 mg via INTRAVENOUS
  Administered 2021-02-10: 10 mg via INTRAVENOUS

## 2021-02-10 MED ORDER — DEXMEDETOMIDINE (PRECEDEX) IN NS 20 MCG/5ML (4 MCG/ML) IV SYRINGE
PREFILLED_SYRINGE | INTRAVENOUS | Status: DC | PRN
  Administered 2021-02-10: 4 ug via INTRAVENOUS
  Administered 2021-02-10: 8 ug via INTRAVENOUS
  Administered 2021-02-10: 4 ug via INTRAVENOUS

## 2021-02-10 MED ORDER — HYDROMORPHONE HCL 1 MG/ML IJ SOLN
INTRAMUSCULAR | Status: AC
  Administered 2021-02-10: 0.25 mg via INTRAVENOUS
  Filled 2021-02-10: qty 1

## 2021-02-10 MED ORDER — FENTANYL CITRATE (PF) 250 MCG/5ML IJ SOLN
INTRAMUSCULAR | Status: AC
  Filled 2021-02-10: qty 5

## 2021-02-10 MED ORDER — KETOROLAC TROMETHAMINE 30 MG/ML IJ SOLN
30.0000 mg | Freq: Once | INTRAMUSCULAR | Status: AC | PRN
  Administered 2021-02-10: 30 mg via INTRAVENOUS

## 2021-02-10 MED ORDER — DEXMEDETOMIDINE (PRECEDEX) IN NS 20 MCG/5ML (4 MCG/ML) IV SYRINGE
PREFILLED_SYRINGE | INTRAVENOUS | Status: AC
  Filled 2021-02-10: qty 5

## 2021-02-10 MED ORDER — MORPHINE SULFATE (PF) 2 MG/ML IV SOLN: 1.0000 mg | INTRAVENOUS | Status: DC | PRN

## 2021-02-10 MED ORDER — ROCURONIUM BROMIDE 10 MG/ML (PF) SYRINGE
PREFILLED_SYRINGE | INTRAVENOUS | Status: AC
  Filled 2021-02-10: qty 10

## 2021-02-10 MED ORDER — BUPIVACAINE LIPOSOME 1.3 % IJ SUSP
INTRAMUSCULAR | Status: AC
  Filled 2021-02-10: qty 20

## 2021-02-10 MED ORDER — MIDAZOLAM HCL 2 MG/2ML IJ SOLN
INTRAMUSCULAR | Status: AC
  Filled 2021-02-10: qty 2

## 2021-02-10 MED ORDER — ONDANSETRON HCL 4 MG/2ML IJ SOLN
INTRAMUSCULAR | Status: DC | PRN
Start: 2021-02-10 — End: 2021-02-10
  Administered 2021-02-10: 4 mg via INTRAVENOUS

## 2021-02-10 MED ORDER — PROPOFOL 10 MG/ML IV BOLUS
INTRAVENOUS | Status: AC
  Filled 2021-02-10: qty 20

## 2021-02-10 MED ORDER — PROPOFOL 10 MG/ML IV BOLUS
INTRAVENOUS | Status: DC | PRN
Start: 2021-02-10 — End: 2021-02-10
  Administered 2021-02-10: 100 mg via INTRAVENOUS

## 2021-02-10 MED ORDER — HYDROMORPHONE HCL 1 MG/ML IJ SOLN
0.2500 mg | INTRAMUSCULAR | Status: DC | PRN
  Administered 2021-02-10: 0.25 mg via INTRAVENOUS

## 2021-02-10 MED ORDER — KETAMINE HCL 50 MG/5ML IJ SOSY
PREFILLED_SYRINGE | INTRAMUSCULAR | Status: AC
  Filled 2021-02-10: qty 5

## 2021-02-10 MED ORDER — METHOCARBAMOL 500 MG PO TABS
750.0000 mg | ORAL_TABLET | Freq: Three times a day (TID) | ORAL | Status: DC
  Administered 2021-02-10 – 2021-02-16 (×17): 750 mg via ORAL
  Filled 2021-02-10 (×17): qty 2

## 2021-02-10 MED ORDER — FENTANYL CITRATE (PF) 100 MCG/2ML IJ SOLN
INTRAMUSCULAR | Status: AC
  Filled 2021-02-10: qty 2

## 2021-02-10 MED ORDER — SODIUM CHLORIDE (PF) 0.9 % IJ SOLN
INTRAMUSCULAR | Status: DC | PRN
  Administered 2021-02-10: 10 mL

## 2021-02-10 MED ORDER — KETAMINE HCL 10 MG/ML IJ SOLN
INTRAMUSCULAR | Status: DC | PRN
  Administered 2021-02-10: 10 mg via INTRAVENOUS
  Administered 2021-02-10: 20 mg via INTRAVENOUS

## 2021-02-10 MED ORDER — KETOROLAC TROMETHAMINE 30 MG/ML IJ SOLN
30.0000 mg | Freq: Three times a day (TID) | INTRAMUSCULAR | Status: AC
  Administered 2021-02-10 – 2021-02-13 (×9): 30 mg via INTRAVENOUS
  Filled 2021-02-10 (×9): qty 1

## 2021-02-10 MED ORDER — FENTANYL CITRATE (PF) 100 MCG/2ML IJ SOLN
INTRAMUSCULAR | Status: DC | PRN
Start: 2021-02-10 — End: 2021-02-10
  Administered 2021-02-10: 50 ug via INTRAVENOUS
  Administered 2021-02-10 (×2): 25 ug via INTRAVENOUS
  Administered 2021-02-10 (×5): 50 ug via INTRAVENOUS

## 2021-02-10 MED ORDER — MIDAZOLAM HCL 5 MG/5ML IJ SOLN
INTRAMUSCULAR | Status: DC | PRN
  Administered 2021-02-10 (×2): 1 mg via INTRAVENOUS

## 2021-02-10 MED ORDER — LIDOCAINE HCL (PF) 2 % IJ SOLN
INTRAMUSCULAR | Status: DC | PRN
  Administered 2021-02-10: 1.5 mg/kg/h via INTRADERMAL

## 2021-02-10 MED ORDER — LIDOCAINE HCL (PF) 2 % IJ SOLN
INTRAMUSCULAR | Status: AC
  Filled 2021-02-10: qty 10

## 2021-02-10 MED ORDER — ONDANSETRON HCL 4 MG/2ML IJ SOLN
INTRAMUSCULAR | Status: AC
  Filled 2021-02-10: qty 2

## 2021-02-10 MED ORDER — SODIUM CHLORIDE (PF) 0.9 % IJ SOLN
INTRAMUSCULAR | Status: AC
  Filled 2021-02-10: qty 10

## 2021-02-10 MED ORDER — LACTATED RINGERS IV SOLN: INTRAVENOUS | Status: DC

## 2021-02-10 MED ORDER — BUPIVACAINE LIPOSOME 1.3 % IJ SUSP
INTRAMUSCULAR | Status: DC | PRN
  Administered 2021-02-10: 20 mL

## 2021-02-10 MED ORDER — OXYCODONE HCL 5 MG PO TABS: 5.0000 mg | ORAL_TABLET | Freq: Once | ORAL | Status: AC | PRN

## 2021-02-10 MED ORDER — LIDOCAINE HCL (PF) 2 % IJ SOLN
INTRAMUSCULAR | Status: AC
  Filled 2021-02-10: qty 15

## 2021-02-10 MED ORDER — ONDANSETRON HCL 4 MG/2ML IJ SOLN: 4.0000 mg | Freq: Once | INTRAMUSCULAR | Status: DC | PRN

## 2021-02-10 MED ORDER — SUGAMMADEX SODIUM 200 MG/2ML IV SOLN
INTRAVENOUS | Status: DC | PRN
Start: 2021-02-10 — End: 2021-02-10
  Administered 2021-02-10: 50 mg via INTRAVENOUS
  Administered 2021-02-10: 200 mg via INTRAVENOUS

## 2021-02-10 MED ORDER — DEXAMETHASONE SODIUM PHOSPHATE 10 MG/ML IJ SOLN
INTRAMUSCULAR | Status: AC
  Filled 2021-02-10: qty 1

## 2021-02-10 MED ORDER — POTASSIUM CHLORIDE 10 MEQ/100ML IV SOLN
10.0000 meq | INTRAVENOUS | Status: DC
  Administered 2021-02-10: 10 meq via INTRAVENOUS
  Filled 2021-02-10 (×4): qty 100

## 2021-02-10 MED ORDER — ENOXAPARIN SODIUM 40 MG/0.4ML IJ SOSY
40.0000 mg | PREFILLED_SYRINGE | INTRAMUSCULAR | Status: DC
  Administered 2021-02-11 – 2021-02-15 (×5): 40 mg via SUBCUTANEOUS
  Filled 2021-02-10 (×5): qty 0.4

## 2021-02-10 MED ORDER — KETOROLAC TROMETHAMINE 30 MG/ML IJ SOLN
INTRAMUSCULAR | Status: AC
  Filled 2021-02-10: qty 1

## 2021-02-10 MED ORDER — 0.9 % SODIUM CHLORIDE (POUR BTL) OPTIME
TOPICAL | Status: DC | PRN
  Administered 2021-02-10: 2000 mL

## 2021-02-10 MED ORDER — ACETAMINOPHEN 500 MG PO TABS
1000.0000 mg | ORAL_TABLET | Freq: Four times a day (QID) | ORAL | Status: DC
  Administered 2021-02-10 – 2021-02-16 (×19): 1000 mg via ORAL
  Filled 2021-02-10 (×21): qty 2

## 2021-02-10 MED ORDER — ALBUMIN HUMAN 5 % IV SOLN: INTRAVENOUS | Status: DC | PRN

## 2021-02-10 MED ORDER — OXYCODONE HCL 5 MG PO TABS
5.0000 mg | ORAL_TABLET | ORAL | Status: DC | PRN
  Administered 2021-02-14: 5 mg via ORAL
  Filled 2021-02-10: qty 1

## 2021-02-10 MED ORDER — OXYCODONE HCL 5 MG/5ML PO SOLN: 5.0000 mg | Freq: Once | ORAL | Status: AC | PRN

## 2021-02-10 SURGICAL SUPPLY — 93 items
APPLIER CLIP 5 13 M/L LIGAMAX5 (MISCELLANEOUS)
APPLIER CLIP ROT 13.4 12 LRG (CLIP)
BAG COUNTER SPONGE SURGICOUNT (BAG) IMPLANT
BLADE EXTENDED COATED 6.5IN (ELECTRODE) IMPLANT
CANNULA REDUC XI 12-8 STAPL (CANNULA)
CANNULA REDUCER 12-8 DVNC XI (CANNULA) ×1 IMPLANT
CELLS DAT CNTRL 66122 CELL SVR (MISCELLANEOUS) ×1 IMPLANT
CLIP APPLIE 5 13 M/L LIGAMAX5 (MISCELLANEOUS) IMPLANT
CLIP APPLIE ROT 13.4 12 LRG (CLIP) IMPLANT
CLIP LIGATING HEM O LOK PURPLE (MISCELLANEOUS) IMPLANT
COUNTER NEEDLE 20 DBL MAG RED (NEEDLE) ×2 IMPLANT
COVER MAYO STAND STRL (DRAPES) ×4 IMPLANT
COVER SURGICAL LIGHT HANDLE (MISCELLANEOUS) ×2 IMPLANT
COVER TIP SHEARS 8 DVNC (MISCELLANEOUS) ×1 IMPLANT
COVER TIP SHEARS 8MM DA VINCI (MISCELLANEOUS)
DECANTER SPIKE VIAL GLASS SM (MISCELLANEOUS) ×1 IMPLANT
DEVICE TROCAR PUNCTURE CLOSURE (ENDOMECHANICALS) IMPLANT
DRAIN CHANNEL 19F RND (DRAIN) IMPLANT
DRAPE ARM DVNC X/XI (DISPOSABLE) ×4 IMPLANT
DRAPE COLUMN DVNC XI (DISPOSABLE) ×1 IMPLANT
DRAPE DA VINCI XI ARM (DISPOSABLE) ×8
DRAPE DA VINCI XI COLUMN (DISPOSABLE) ×2
DRAPE SURG IRRIG POUCH 19X23 (DRAPES) IMPLANT
DRSG OPSITE POSTOP 4X10 (GAUZE/BANDAGES/DRESSINGS) IMPLANT
DRSG OPSITE POSTOP 4X6 (GAUZE/BANDAGES/DRESSINGS) IMPLANT
DRSG OPSITE POSTOP 4X8 (GAUZE/BANDAGES/DRESSINGS) ×1 IMPLANT
ELECT REM PT RETURN 15FT ADLT (MISCELLANEOUS) ×2 IMPLANT
ENDOLOOP SUT PDS II  0 18 (SUTURE)
ENDOLOOP SUT PDS II 0 18 (SUTURE) IMPLANT
EVACUATOR SILICONE 100CC (DRAIN) IMPLANT
GAUZE SPONGE 4X4 12PLY STRL (GAUZE/BANDAGES/DRESSINGS) IMPLANT
GLOVE SURG ENC TEXT LTX SZ8 (GLOVE) ×6 IMPLANT
GOWN STRL REUS W/TWL XL LVL3 (GOWN DISPOSABLE) ×10 IMPLANT
HANDLE STAPLE EGIA 4 XL (STAPLE) ×1 IMPLANT
HOLDER FOLEY CATH W/STRAP (MISCELLANEOUS) ×1 IMPLANT
KIT TURNOVER KIT A (KITS) IMPLANT
LEGGING LITHOTOMY PAIR STRL (DRAPES) IMPLANT
LIGASURE IMPACT 36 18CM CVD LR (INSTRUMENTS) ×1 IMPLANT
PACK CARDIOVASCULAR III (CUSTOM PROCEDURE TRAY) ×2 IMPLANT
PACK COLON (CUSTOM PROCEDURE TRAY) ×1 IMPLANT
PAD POSITIONING PINK XL (MISCELLANEOUS) ×2 IMPLANT
PENCIL SMOKE EVACUATOR (MISCELLANEOUS) IMPLANT
RELOAD STAPLE 45 3.5 BLU DVNC (STAPLE) IMPLANT
RELOAD STAPLE 45 4.3 GRN DVNC (STAPLE) IMPLANT
RELOAD STAPLER 3.5X45 BLU DVNC (STAPLE) IMPLANT
RELOAD STAPLER 4.3X45 GRN DVNC (STAPLE) IMPLANT
RELOAD TRI 45 ART MED THCK BLK (STAPLE) ×1 IMPLANT
RELOAD TRI 60 ART MED THCK BLK (STAPLE) ×1 IMPLANT
RELOAD TRI 60 ART MED THCK PUR (STAPLE) ×2 IMPLANT
RETRACTOR WND ALEXIS 18 MED (MISCELLANEOUS) IMPLANT
RTRCTR WOUND ALEXIS 18CM MED (MISCELLANEOUS) ×2
SCISSORS LAP 5X45 EPIX DISP (ENDOMECHANICALS) ×2 IMPLANT
SEAL CANN UNIV 5-8 DVNC XI (MISCELLANEOUS) ×3 IMPLANT
SEAL XI 5MM-8MM UNIVERSAL (MISCELLANEOUS) ×8
SEALER VESSEL DA VINCI XI (MISCELLANEOUS) ×2
SEALER VESSEL EXT DVNC XI (MISCELLANEOUS) ×1 IMPLANT
SET BI-LUMEN FLTR TB AIRSEAL (TUBING) ×1 IMPLANT
SOLUTION ELECTROLUBE (MISCELLANEOUS) ×2 IMPLANT
STAPLER CANNULA SEAL DVNC XI (STAPLE) ×1 IMPLANT
STAPLER CANNULA SEAL XI (STAPLE)
STAPLER RELOAD 3.5X45 BLU DVNC (STAPLE)
STAPLER RELOAD 3.5X45 BLUE (STAPLE)
STAPLER RELOAD 4.3X45 GREEN (STAPLE)
STAPLER RELOAD 4.3X45 GRN DVNC (STAPLE)
STAPLER VISISTAT 35W (STAPLE) IMPLANT
SUT CHROMIC 3 0 SH 27 (SUTURE) ×3 IMPLANT
SUT ETHILON 2 0 PS N (SUTURE) ×1 IMPLANT
SUT MNCRL AB 4-0 PS2 18 (SUTURE) ×1 IMPLANT
SUT NOVA 1 T20/GS 25DT (SUTURE) ×2 IMPLANT
SUT PDS AB 1 CTX 36 (SUTURE) IMPLANT
SUT PDS AB 2-0 CT2 27 (SUTURE) IMPLANT
SUT PDS AB 4-0 SH 27 (SUTURE) IMPLANT
SUT PROLENE 2 0 SH DA (SUTURE) ×2 IMPLANT
SUT SILK 2 0 (SUTURE)
SUT SILK 2 0 SH CR/8 (SUTURE) ×2 IMPLANT
SUT SILK 2-0 18XBRD TIE 12 (SUTURE) ×1 IMPLANT
SUT SILK 3 0 (SUTURE)
SUT SILK 3 0 SH CR/8 (SUTURE) ×2 IMPLANT
SUT SILK 3-0 18XBRD TIE 12 (SUTURE) ×1 IMPLANT
SUT V-LOC BARB 180 2/0GR6 GS22 (SUTURE)
SUT VIC AB 2-0 SH 27 (SUTURE) ×2
SUT VIC AB 2-0 SH 27X BRD (SUTURE) IMPLANT
SUT VIC AB 4-0 SH 18 (SUTURE) ×1 IMPLANT
SUT VICRYL 0 TIES 12 18 (SUTURE) ×1 IMPLANT
SUTURE V-LC BRB 180 2/0GR6GS22 (SUTURE) IMPLANT
SYR 10ML ECCENTRIC (SYRINGE) IMPLANT
SYS WOUND ALEXIS 18CM MED (MISCELLANEOUS)
SYSTEM WOUND ALEXIS 18CM MED (MISCELLANEOUS) IMPLANT
TOWEL OR NON WOVEN STRL DISP B (DISPOSABLE) ×2 IMPLANT
TRAY FOLEY MTR SLVR 16FR STAT (SET/KITS/TRAYS/PACK) ×1 IMPLANT
TROCAR ADV FIXATION 12X100MM (TROCAR) IMPLANT
TROCAR ADV FIXATION 5X100MM (TROCAR) IMPLANT
TROCAR BLADELESS OPT 5 100 (ENDOMECHANICALS) ×2 IMPLANT

## 2021-02-10 NOTE — Interval H&P Note (Signed)
History and Physical Interval Note:  02/10/2021 9:57 AM  Barbara Valdez  has presented today for surgery, with the diagnosis of SIGMOID COLON MASS.  The various methods of treatment have been discussed with the patient and family. After consideration of risks, benefits and other options for treatment, the patient has consented to  Procedure(s): LAPAROSCOPIC-ASSISTED PARTIAL COLECTOMY WITH COLOSTOMY (N/A) as a surgical intervention.  The patient's history has been reviewed, patient examined, no change in status, stable for surgery.  I have reviewed the patient's chart and labs.  Questions were answered to the patient's satisfaction.     Pedro Earls

## 2021-02-10 NOTE — Progress Notes (Signed)
PROGRESS NOTE    Barbara Valdez  INO:[id number] DOB: Apr 11, 1975 DOA: 02/07/2021 PCP: Pcp, No     Brief Narrative:  46 y.o. BF PMHx fibroids, iron deficiency anemia, colitis, anxiety   Presents with recurrent abdominal pain.  Recent admission November 2022 discharged on antibiotics but did not complete the course due to side effects of nausea, has been attempting to have outpatient colonoscopy for the past 3 months with multiple delays in the setting of insurance and scheduling.    Presents to the ED on 02/07/2021 with profoundly worsening abdominal pain, imaging concerning for colitis.   Subjective: 1/19 afebrile overnight.  Patient currently in surgery, have attempted to see patient twice not on floor.   Assessment & Plan: Covid vaccination;   Principal Problem:   Colitis Active Problems:   Iron deficiency anemia  SBO with rectosigmoid junction abscess/cancer? - Discussed case with Dr. Alessandra Bevels GI who performed colonoscopy, and feels that the mass like lesion in the distal sigmoid colon most likely cancerous.  His team is already contacted general surgery. -1/18 surgery recommends; partial colectomy/ colostomy this admission.  Have scheduled for 1/19  Acute on chronic recurrent colitis POA/Acute abdominal pain -GI following,  -Multiple delays outpatient setting due to scheduling, poor compliance with bowel prep and insurance issues -1/18 colonoscopy reveals most likely cancerous mass see SBO  -Continue antibiotics per GI   Anemia, microcytic -Chronic questionably anemia of chronic disease and malabsorption given above -1/18 anemia panel pending  Hypokalemia - Potassium goal> 4 - Potassium IV 60 mEq     DVT prophylaxis: SCD Code Status: Full Family Communication:  Status is: Inpatient    Dispo: The patient is from: Home              Anticipated d/c is to:                 Anticipated d/c date is: > 3 days              Patient currently is not medically  stable to d/c.      Consultants:  GI General surgery    Procedures/Significant Events:  1/18 Colonoscopy: A frond-like/villous, infiltrative and ulcerated partially obstructing large mass was found in the distal sigmoid colon. The mass was partially circumferential (involving two-thirds of the lumen circumference). The mass measured four cm in length. Oozing was present. Biopsies were taken with a cold forceps for histology. Area was tattooed with an injection of Spot (carbon black). A localized area of moderately congested, inflamed and thickened folds of the mucosa was found in the recto-sigmoid colon. Biopsies were taken with a cold forceps for histology.    I have personally reviewed and interpreted all radiology studies and my findings are as above.  VENTILATOR SETTINGS:    Cultures 1/18 colonic biopsy, pathology pending  Antimicrobials: Anti-infectives (From admission, onward)    Start     Ordered Stop   02/10/21 0600  cefoTEtan (CEFOTAN) 2 g in sodium chloride 0.9 % 100 mL IVPB        02/09/21 1431 02/11/21 0559         Devices    LINES / TUBES:      Continuous Infusions:  cefoTEtan (CEFOTAN) IV     lactated ringers 75 mL/hr at 02/10/21 0843   potassium chloride       Objective: Vitals:   02/09/21 1156 02/09/21 1348 02/09/21 2153 02/10/21 0620  BP: (!) 128/98 126/87 118/80 133/90  Pulse: (!) 101 91 89 83  Resp: 20 16 17 17   Temp: 98.8 F (37.1 C) 98.7 F (37.1 C) 99.4 F (37.4 C) 98.2 F (36.8 C)  TempSrc: Oral Oral Oral Oral  SpO2: 100% 99% 99% 99%  Weight:      Height:        Intake/Output Summary (Last 24 hours) at 02/10/2021 0847 Last data filed at 02/10/2021 0102 Gross per 24 hour  Intake 1640.9 ml  Output 5 ml  Net 1635.9 ml    Filed Weights   02/07/21 1142  Weight: 60.8 kg    Examination:  Patient currently in surgery have attempted to see patient twice not on floor no charge .     Data Reviewed: Care during  the described time interval was provided by me .  I have reviewed this patient's available data, including medical history, events of note, physical examination, and all test results as part of my evaluation.  CBC: Recent Labs  Lab 02/07/21 1202 02/08/21 0409 02/09/21 0318 02/10/21 0310  WBC 7.5 7.0 9.7 7.9  NEUTROABS  --   --   --  6.6  HGB 11.4* 9.6* 10.8* 10.6*  HCT 38.3 32.0* 36.4 34.9*  MCV 79.3* 79.6* 80.7 79.3*  PLT 389 288 335 725    Basic Metabolic Panel: Recent Labs  Lab 02/07/21 1202 02/08/21 0409 02/09/21 0318 02/10/21 0310  NA 137 138 135 139  K 3.8 3.7 3.7 2.8*  CL 106 109 104 105  CO2 26 23 19* 23  GLUCOSE 109* 92 123* 129*  BUN 10 9 8 9   CREATININE 0.71 0.71 0.63 0.60  CALCIUM 9.2 8.6* 8.9 9.2  MG  --   --   --  2.2  PHOS  --   --   --  3.9    GFR: Estimated Creatinine Clearance: 84 mL/min (by C-G formula based on SCr of 0.6 mg/dL). Liver Function Tests: Recent Labs  Lab 02/07/21 1202 02/08/21 0409 02/09/21 0318 02/10/21 0310  AST 14* 10* 12* 11*  ALT 9 8 8 9   ALKPHOS 61 49 54 57  BILITOT 0.6 0.6 0.9 0.4  PROT 8.6* 6.5 7.8 7.4  ALBUMIN 3.9 2.9* 3.4* 3.2*    Recent Labs  Lab 02/07/21 1202  LIPASE 24    No results for input(s): AMMONIA in the last 168 hours. Coagulation Profile: No results for input(s): INR, PROTIME in the last 168 hours. Cardiac Enzymes: No results for input(s): CKTOTAL, CKMB, CKMBINDEX, TROPONINI in the last 168 hours. BNP (last 3 results) No results for input(s): PROBNP in the last 8760 hours. HbA1C: No results for input(s): HGBA1C in the last 72 hours. CBG: No results for input(s): GLUCAP in the last 168 hours. Lipid Profile: No results for input(s): CHOL, HDL, LDLCALC, TRIG, CHOLHDL, LDLDIRECT in the last 72 hours. Thyroid Function Tests: No results for input(s): TSH, T4TOTAL, FREET4, T3FREE, THYROIDAB in the last 72 hours. Anemia Panel: Recent Labs    02/10/21 0310  VITAMINB12 1,140*  FOLATE 36.9   FERRITIN 140  TIBC 215*  IRON 26*  RETICCTPCT 1.1   Sepsis Labs: No results for input(s): PROCALCITON, LATICACIDVEN in the last 168 hours.  Recent Results (from the past 240 hour(s))  Resp Panel by RT-PCR (Flu A&B, Covid) Nasopharyngeal Swab     Status: None   Collection Time: 02/08/21  8:50 AM   Specimen: Nasopharyngeal Swab; Nasopharyngeal(NP) swabs in vial transport medium  Result Value Ref Range Status   SARS Coronavirus 2 by RT PCR NEGATIVE NEGATIVE Final  Comment: (NOTE) SARS-CoV-2 target nucleic acids are NOT DETECTED.  The SARS-CoV-2 RNA is generally detectable in upper respiratory specimens during the acute phase of infection. The lowest concentration of SARS-CoV-2 viral copies this assay can detect is 138 copies/mL. A negative result does not preclude SARS-Cov-2 infection and should not be used as the sole basis for treatment or other patient management decisions. A negative result may occur with  improper specimen collection/handling, submission of specimen other than nasopharyngeal swab, presence of viral mutation(s) within the areas targeted by this assay, and inadequate number of viral copies(<138 copies/mL). A negative result must be combined with clinical observations, patient history, and epidemiological information. The expected result is Negative.  Fact Sheet for Patients:  EntrepreneurPulse.com.au  Fact Sheet for Healthcare Providers:  IncredibleEmployment.be  This test is no t yet approved or cleared by the [place] FDA and  has been authorized for detection and/or diagnosis of SARS-CoV-2 by FDA under an Emergency Use Authorization (EUA). This EUA will remain  in effect (meaning this test can be used) for the duration of the COVID-19 declaration under Section 564(b)(1) of the Act, 21 U.S.C.section 360bbb-3(b)(1), unless the authorization is terminated  or revoked sooner.       Influenza A by PCR NEGATIVE  NEGATIVE Final   Influenza B by PCR NEGATIVE NEGATIVE Final    Comment: (NOTE) The Xpert Xpress SARS-CoV-2/FLU/RSV plus assay is intended as an aid in the diagnosis of influenza from Nasopharyngeal swab specimens and should not be used as a sole basis for treatment. Nasal washings and aspirates are unacceptable for Xpert Xpress SARS-CoV-2/FLU/RSV testing.  Fact Sheet for Patients: EntrepreneurPulse.com.au  Fact Sheet for Healthcare Providers: IncredibleEmployment.be  This test is not yet approved or cleared by the [place] FDA and has been authorized for detection and/or diagnosis of SARS-CoV-2 by FDA under an Emergency Use Authorization (EUA). This EUA will remain in effect (meaning this test can be used) for the duration of the COVID-19 declaration under Section 564(b)(1) of the Act, 21 U.S.C. section 360bbb-3(b)(1), unless the authorization is terminated or revoked.  Performed at [facility], [place] [address], [place], [place] [address]   Surgical PCR screen     Status: None   Collection Time: 02/09/21 11:00 PM   Specimen: Nasal Mucosa; Nasal Swab  Result Value Ref Range Status   MRSA, PCR NEGATIVE NEGATIVE Final   Staphylococcus aureus NEGATIVE NEGATIVE Final    Comment: (NOTE) The Xpert SA Assay (FDA approved for NASAL specimens in patients 82 years of age and older), is one component of a comprehensive surveillance program. It is not intended to diagnose infection nor to guide or monitor treatment. Performed at [facility], [place] [address] [place], [place] [address]           Radiology Studies: No results found.      Scheduled Meds:  feeding supplement  1 Container Oral TID BM   multivitamin with minerals  1 tablet Oral Daily   mupirocin ointment  1 application Nasal BID   pantoprazole (PROTONIX) IV  40 mg Intravenous Q12H   sodium chloride flush  3 mL Intravenous Q12H    Continuous Infusions:  cefoTEtan (CEFOTAN) IV     lactated ringers 75 mL/hr at 02/10/21 0843   potassium chloride       LOS: 2 days    Time spent:40 min    Tascha Casares, Geraldo Docker, MD [facility]   If 7PM-7AM, please contact night-coverage 02/10/2021, 8:47 AM

## 2021-02-10 NOTE — Progress Notes (Addendum)
Dr. Hassell Done was notified face-to-face on unit regarding patient not receiving all previously ordered runs of K+ during colectomy/colostomy procedure. New order for STAT BMET obtained to re-establish K+ value. Dr. Dia Crawford updated for management as appropriate. Ivan Anchors, RN 02/10/21 5:56 PM

## 2021-02-10 NOTE — Plan of Care (Signed)
Problem: Education: Goal: Knowledge of General Education information will improve Description: Including pain rating scale, medication(s)/side effects and non-pharmacologic comfort measures Outcome: Progressing   Problem: Clinical Measurements: Goal: Cardiovascular complication will be avoided Outcome: Progressing   Problem: Coping: Goal: Level of anxiety will decrease Outcome: [place], RN 02/10/21 8:21 AM

## 2021-02-10 NOTE — Op Note (Signed)
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  Desmoplastic reaction in the peritoneal reflection  Procedure: Laparoscopy and open sigmoid colectomy with end pouch [name] procedure) Location Surgery: [facility] Complications:  None noted  EBL:   40 cc  Drains: 19 blake drain in the pelvis  Description of Procedure:  The patient was taken to OR 5 .  After anesthesia was administered and the patient was prepped  with chloroprep  and a timeout was performed.  The robot was prepared for potential docking for robotic assistance.  I entered the abdomen to the right of the umbilicus with an Optiview 5 mm and the abdomen was found to be markedly distended with gas all the way to the terminal ileum.  Carefully inserted 1 8 mm robotic trocar in the right side and with that took down some adhesions in the mid upper midline.  With the patient in steep Trendelenburg I did not see the tattoo.  I elected to open this patient and do this or I could feel the tumor and mobilized the bowel.  Lower midline incision was made down to the pubis and the wound protector placed.  In the pelvis but it dominantly on the left side there was a baseball sized hard mass fixed to the left ovary.  The patient had a previous hysterectomy.  It was stuck down and these adhesions were taken down with sharp dissection and with the Covidien LigaSure.  I was then able to mobilize it in and into doing bring it up into the wound and found that it was near completely obstructing and furthermore it had stuck to the bowel distally on the anterior surface of the distal sigmoid near the peritoneal reflection.  Elected to go proximally and divide the bowel with a  LigaSure.  I then went distal to the immediate obvious mass with a stapler and remove the specimen and oriented it with a suture on the distal margin.  In examining the distal margin there was a hole where this: It stuck down anteriorly.  This about the size of a quarter.  I then mobilized more the rectum but it was getting into a desmoplastic reaction.  I tried with the black load Covidien stapler but was unable to fire across this because it was too thick.  Therefore I amputated below the hole and oversewed it with a running 2-0 Prolene.  [place] drain was placed through the trocar site on the right side.  The ostomy defect was cut out which had been previously marked on the left side.  We then scored the anterior sheath with the Bovie in a stellate fashion and created my opening.  I brought up the sigmoid colon and got a good length above the skin level.  Changed my gloves.  Closed the lower midline incision in 2 layers with 2-0 Vicryl in the prone on the peritoneum and then with interrupted figure-of-eight sutures of #1 Novafil.  Irrigated and closed with a stapler.  The other trocar site was then closed with a stapler.  The ostomy was matured with two 3-0 chromic's in a running locking fashion from either end.  Digital examination revealed presence of stool and it  did not appear to be twisted.  The mucosa was pink and looked very viable.  Patient appears to have locally invasive cancer of the sigmoid with near-total obstruction and unprepped and was resected in a Hartmann procedure fashion with an end colostomy and the rectal stump closed with Prolene.  The patient tolerated the procedure well and was taken to the PACU in stable condition.     Matt B. Hassell Done, [place], [facility], [place]

## 2021-02-10 NOTE — Anesthesia Postprocedure Evaluation (Signed)
Anesthesia Post Note  Patient: Firefighter  Procedure(s) Performed: ROBOTIC ASSISTED PARTIAL COLECTOMY WITH COLOSTOMY (Abdomen)     Patient location during evaluation: PACU Anesthesia Type: General Level of consciousness: awake and alert Pain management: pain level controlled Vital Signs Assessment: post-procedure vital signs reviewed and stable Respiratory status: spontaneous breathing, nonlabored ventilation, respiratory function stable and patient connected to nasal cannula oxygen Cardiovascular status: blood pressure returned to baseline and stable Postop Assessment: no apparent nausea or vomiting Anesthetic complications: no   No notable events documented.  Last Vitals:  Vitals:   02/10/21 1545 02/10/21 1600  BP: 114/88 112/83  Pulse: 87 90  Resp: 12 14  Temp:    SpO2: 96% 94%    Last Pain:  Vitals:   02/10/21 1600  TempSrc:   PainSc: 0-No pain                 Barnet Glasgow

## 2021-02-10 NOTE — Anesthesia Procedure Notes (Signed)
Procedure Name: Intubation Date/Time: 02/10/2021 10:58 AM Performed by: Garrel Ridgel, CRNA Pre-anesthesia Checklist: Patient identified, Emergency Drugs available, Suction available and Patient being monitored Patient Re-evaluated:Patient Re-evaluated prior to induction Oxygen Delivery Method: Circle system utilized Preoxygenation: Pre-oxygenation with 100% oxygen Induction Type: IV induction Ventilation: Mask ventilation without difficulty Laryngoscope Size: Mac and 3 Grade View: Grade II Tube type: Oral Tube size: 7.0 mm Number of attempts: 1 Airway Equipment and Method: Stylet and Oral airway Placement Confirmation: ETT inserted through vocal cords under direct vision, positive ETCO2 and breath sounds checked- equal and bilateral Secured at: 23 cm Tube secured with: Tape Dental Injury: Teeth and Oropharynx as per pre-operative assessment

## 2021-02-10 NOTE — Transfer of Care (Signed)
Immediate Anesthesia Transfer of Care Note  Patient: Barbara Valdez  Procedure(s) Performed: ROBOTIC ASSISTED PARTIAL COLECTOMY WITH COLOSTOMY (Abdomen)  Patient Location: PACU  Anesthesia Type:General  Level of Consciousness: awake  Airway & Oxygen Therapy: Patient Spontanous Breathing and Patient connected to face mask oxygen  Post-op Assessment: Report given to RN and Post -op Vital signs reviewed and stable  Post vital signs: Reviewed and stable  Last Vitals:  Vitals Value Taken Time  BP 132/90 02/10/21 1430  Temp    Pulse 98 02/10/21 1433  Resp 19 02/10/21 1433  SpO2 100 % 02/10/21 1433  Vitals shown include unvalidated device data.  Last Pain:  Vitals:   02/10/21 0815  TempSrc:   PainSc: 0-No pain      Patients Stated Pain Goal: 2 (79/39/68 [id number])  Complications: No notable events documented.

## 2021-02-10 NOTE — Progress Notes (Signed)
Call placed to Short Stay to update on patient. Spoke with Ander Purpura, RN and made aware that patient has lost IV access. This RN unable to start K+ runs as ordered. IV team notified for IV re-start. Medications (K+ and antibiotic) to be sent down with patient to surgery.  Ivan Anchors, RN 02/10/21 8:29 AM

## 2021-02-10 NOTE — Plan of Care (Signed)
  Problem: Education: Goal: Knowledge of General Education information will improve Description: Including pain rating scale, medication(s)/side effects and non-pharmacologic comfort measures Outcome: Progressing   Problem: Pain Managment: Goal: General experience of comfort will improve Outcome: Progressing   

## 2021-02-11 ENCOUNTER — Encounter (HOSPITAL_COMMUNITY): Payer: Self-pay | Admitting: Surgery

## 2021-02-11 ENCOUNTER — Inpatient Hospital Stay (HOSPITAL_COMMUNITY): Payer: 59

## 2021-02-11 DIAGNOSIS — C187 Malignant neoplasm of sigmoid colon: Secondary | ICD-10-CM | POA: Diagnosis present

## 2021-02-11 DIAGNOSIS — D508 Other iron deficiency anemias: Secondary | ICD-10-CM

## 2021-02-11 DIAGNOSIS — C189 Malignant neoplasm of colon, unspecified: Secondary | ICD-10-CM

## 2021-02-11 LAB — BASIC METABOLIC PANEL
Anion gap: 7 (ref 5–15)
BUN: 10 mg/dL (ref 6–20)
CO2: 26 mmol/L (ref 22–32)
Calcium: 8.4 mg/dL — ABNORMAL LOW (ref 8.9–10.3)
Chloride: 105 mmol/L (ref 98–111)
Creatinine, Ser: 0.66 mg/dL (ref 0.44–1.00)
GFR, Estimated: >60 mL/min (ref >60)
Glucose, Bld: 117 mg/dL — ABNORMAL HIGH (ref 70–99)
Potassium: 3.6 mmol/L (ref 3.5–5.1)
Sodium: 138 mmol/L (ref 135–145)

## 2021-02-11 LAB — CBC WITH DIFFERENTIAL/PLATELET
Abs Immature Granulocytes: 0.04 10*3/uL (ref 0.00–0.07)
Basophils Absolute: 0 10*3/uL (ref 0.0–0.1)
Basophils Relative: 0 %
Eosinophils Absolute: 0 10*3/uL (ref 0.0–0.5)
Eosinophils Relative: 0 %
HCT: 29.7 % — ABNORMAL LOW (ref 36.0–46.0)
Hemoglobin: 9 g/dL — ABNORMAL LOW (ref 12.0–15.0)
Immature Granulocytes: 0 %
Lymphocytes Relative: 13 %
Lymphs Abs: 1.2 10*3/uL (ref 0.7–4.0)
MCH: 24 pg — ABNORMAL LOW (ref 26.0–34.0)
MCHC: 30.3 g/dL (ref 30.0–36.0)
MCV: 79.2 fL — ABNORMAL LOW (ref 80.0–100.0)
Monocytes Absolute: 0.9 10*3/uL (ref 0.1–1.0)
Monocytes Relative: 10 %
Neutro Abs: 7.2 10*3/uL (ref 1.7–7.7)
Neutrophils Relative %: 77 %
Platelets: 331 10*3/uL (ref 150–400)
RBC: 3.75 MIL/uL — ABNORMAL LOW (ref 3.87–5.11)
RDW: 17.2 % — ABNORMAL HIGH (ref 11.5–15.5)
WBC: 9.4 10*3/uL (ref 4.0–10.5)
nRBC: 0 % (ref 0.0–0.2)

## 2021-02-11 LAB — PHOSPHORUS: Phosphorus: 3.1 mg/dL (ref 2.5–4.6)

## 2021-02-11 LAB — MAGNESIUM: Magnesium: 1.8 mg/dL (ref 1.7–2.4)

## 2021-02-11 MED ORDER — IOHEXOL 300 MG/ML  SOLN
75.0000 mL | Freq: Once | INTRAMUSCULAR | Status: AC | PRN
  Administered 2021-02-11: 75 mL via INTRAVENOUS

## 2021-02-11 MED ORDER — POTASSIUM CHLORIDE 10 MEQ/100ML IV SOLN
10.0000 meq | INTRAVENOUS | Status: AC
  Administered 2021-02-11 (×5): 10 meq via INTRAVENOUS
  Filled 2021-02-11 (×5): qty 100

## 2021-02-11 MED ORDER — MAGNESIUM SULFATE 2 GM/50ML IV SOLN
2.0000 g | Freq: Once | INTRAVENOUS | Status: AC
  Administered 2021-02-11: 2 g via INTRAVENOUS
  Filled 2021-02-11: qty 50

## 2021-02-11 NOTE — Progress Notes (Signed)
ANTICOAGULATION CONSULT NOTE - Initial Consult  Pharmacy Consult for LMWH Indication: VTE prophylaxis  No Known Allergies  Patient Measurements: Height: 5\' 6"  (167.6 cm) Weight: 60.8 kg (134 lb) IBW/kg (Calculated) : 59.3 Heparin Dosing Weight:   Vital Signs: Temp: 99.1 F (37.3 C) (01/20 1349) Temp Source: Oral (01/20 1349) BP: 101/69 (01/20 1349) Pulse Rate: 84 (01/20 1349)  Labs: Recent Labs    02/09/21 0318 02/10/21 0310 02/10/21 1805 02/11/21 0303  HGB 10.8* 10.6*  --  9.0*  HCT 36.4 34.9*  --  29.7*  PLT 335 375  --  331  CREATININE 0.63 0.60 0.62 0.66    Estimated Creatinine Clearance: 84 mL/min (by C-G formula based on SCr of 0.66 mg/dL).   Medical History: Past Medical History:  Diagnosis Date   Anemia    Anxiety    Complication of anesthesia    took a long time to wake up after surgery     Plan:  Continue LMWH 40 q24 for VTE px Pharmacy to sign off  Eudelia Bunch, Pharm.D 02/11/2021 6:54 PM

## 2021-02-11 NOTE — Progress Notes (Signed)
PROGRESS NOTE    Barbara Valdez  BTD:[id number] DOB: 01-05-1976 DOA: 02/07/2021 PCP: Pcp, No     Brief Narrative:  46 y.o. BF PMHx fibroids, iron deficiency anemia, colitis, anxiety   Presents with recurrent abdominal pain.  Recent admission November 2022 discharged on antibiotics but did not complete the course due to side effects of nausea, has been attempting to have outpatient colonoscopy for the past 3 months with multiple delays in the setting of insurance and scheduling.    Presents to the ED on 02/07/2021 with profoundly worsening abdominal pain, imaging concerning for colitis.   Subjective: 1/20 afebrile overnight A/O x4, states pain controlled, negative nausea, negative on   Assessment & Plan: Covid vaccination;   Principal Problem:   Colitis Active Problems:   Iron deficiency anemia   Colon cancer ([place])  SBO with rectosigmoid junction abscess/COLON cancer - Discussed case with Dr. Alessandra Bevels GI who performed colonoscopy, and feels that the mass like lesion in the distal sigmoid colon most likely cancerous.  His team is already contacted general surgery. -1/18 surgery recommends; partial colectomy/ colostomy this admission.  Have scheduled for 1/19 -1/18 biopsy from sigmoid colon C/W Adenomatous lesion with extensive high-grade dysplasia.  See results below -1/19 s/p colectomy: Invasive cancer.  See results below -1/20 CT chest W contrast pending   Acute on chronic recurrent colitis POA/Acute abdominal pain -GI following,  -Multiple delays outpatient setting due to scheduling, poor compliance with bowel prep and insurance issues -1/18 colonoscopy reveals most likely cancerous mass see SBO  -Continue antibiotics per GI   Anemia, microcytic -Chronic questionably anemia of chronic disease and malabsorption given above -1/18 anemia panel; appears to be mixed picture. - 1/20 will consider possible IV iron prior to chemotherapy.  Will await stabilization over next 48  hours post surgery, may consult oncology prior to administration. - Continue to monitor hemoglobin level closely  Lab Results  Component Value Date   HGB 9.0 (L) 02/11/2021   HGB 10.6 (L) 02/10/2021   HGB 10.8 (L) 02/09/2021   HGB 9.6 (L) 02/08/2021   HGB 11.4 (L) 02/07/2021    Hypokalemia - Potassium goal> 4 - 1/20 potassium IV 50 mEq  Hypomagnesmia - Magnesium goal> 2 - 1/20 magnesium IV 2 g  Goals of care - 1/20 Palliative Care Consult: Young patient with new onset colon cancer, discuss goals of care,HCPOA.     DVT prophylaxis: Lovenox per pharmacy Code Status: Full Family Communication: 1/20 aunt present at bedside for discussion of plan of care all questions answered Status is: Inpatient    Dispo: The patient is from: Home              Anticipated d/c is to:                 Anticipated d/c date is: > 3 days              Patient currently is not medically stable to d/c.      Consultants:  [facility] General surgery    Procedures/Significant Events:  1/18 Colonoscopy: COLON MASS, SIGMOID, BIOPSY:  - Adenomatous lesion with extensive high-grade dysplasia, see comment   B. RECTAL, COLON, SIGMOID, BIOPSY:  - Mild acute/ subacute nonspecific colitis, see comment  - Negative for definite features of chronicity, granulomas or dysplasia  1/19  Laparoscopy and open sigmoid colectomy with end pouch [name] procedure):appears to have locally invasive cancer of the sigmoid with near-total obstruction     I have personally reviewed  and interpreted all radiology studies and my findings are as above.  VENTILATOR SETTINGS:    Cultures 1/18 colonic biopsy, pathology pending  Antimicrobials: Anti-infectives (From admission, onward)    Start     Ordered Stop   02/10/21 0600  cefoTEtan (CEFOTAN) 2 g in sodium chloride 0.9 % 100 mL IVPB        02/09/21 1431 02/11/21 0559         Devices    LINES / TUBES:      Continuous Infusions:  lactated  ringers 75 mL/hr at 02/11/21 0934   magnesium sulfate bolus IVPB     potassium chloride       Objective: Vitals:   02/10/21 1600 02/10/21 2130 02/11/21 0453 02/11/21 0844  BP: 112/83 105/73 100/78 103/77  Pulse: 90 78 80 77  Resp: 14 17 17 16   Temp:  97.7 F (36.5 C) 98.3 F (36.8 C) 98.5 F (36.9 C)  TempSrc:  Oral Oral Oral  SpO2: 94% 98% 99% 100%  Weight:      Height:        Intake/Output Summary (Last 24 hours) at 02/11/2021 1133 Last data filed at 02/11/2021 1057 Gross per 24 hour  Intake 3901.75 ml  Output 2970 ml  Net 931.75 ml   Filed Weights   02/07/21 1142  Weight: 60.8 kg    Examination: General: A/O x4, No acute respiratory distress Eyes: negative scleral hemorrhage, negative anisocoria, negative icterus ENT: Negative Runny nose, negative gingival bleeding, Neck:  Negative scars, masses, torticollis, lymphadenopathy, JVD Lungs: Clear to auscultation bilaterally without wheezes or crackles Cardiovascular: Regular rate and rhythm without murmur gallop or rub normal S1 and S2 Abdomen: Positive abdominal pain epigastric with palpation, nondistended, negative bowel sounds, no rebound, no ascites, no appreciable mass Extremities: No significant cyanosis, clubbing, or edema bilateral lower extremities Skin: Negative rashes, lesions, ulcers Psychiatric:  Negative depression, negative anxiety, negative fatigue, negative mania  Central nervous system:  Cranial nerves II through XII intact, tongue/uvula midline, all extremities muscle strength 5/5, sensation intact throughout, negative dysarthria, negative expressive aphasia, negative receptive aphasia.    Data Reviewed: Care during the described time interval was provided by me .  I have reviewed this patient's available data, including medical history, events of note, physical examination, and all test results as part of my evaluation.  CBC: Recent Labs  Lab 02/07/21 1202 02/08/21 0409 02/09/21 0318  02/10/21 0310 02/11/21 0303  WBC 7.5 7.0 9.7 7.9 9.4  NEUTROABS  --   --   --  6.6 7.2  HGB 11.4* 9.6* 10.8* 10.6* 9.0*  HCT 38.3 32.0* 36.4 34.9* 29.7*  MCV 79.3* 79.6* 80.7 79.3* 79.2*  PLT 389 288 335 375 709   Basic Metabolic Panel: Recent Labs  Lab 02/08/21 0409 02/09/21 0318 02/10/21 0310 02/10/21 1805 02/11/21 0303  NA 138 135 139 140 138  K 3.7 3.7 2.8* 3.5 3.6  CL 109 104 105 106 105  CO2 23 19* 23 24 26   GLUCOSE 92 123* 129* 151* 117*  BUN 9 8 9 10 10   CREATININE 0.71 0.63 0.60 0.62 0.66  CALCIUM 8.6* 8.9 9.2 8.5* 8.4*  MG  --   --  2.2  --  1.8  PHOS  --   --  3.9  --  3.1   GFR: Estimated Creatinine Clearance: 84 mL/min (by C-G formula based on SCr of 0.66 mg/dL). Liver Function Tests: Recent Labs  Lab 02/07/21 1202 02/08/21 0409 02/09/21 0318 02/10/21 0310  AST  14* 10* 12* 11*  ALT 9 8 8 9   ALKPHOS 61 49 54 57  BILITOT 0.6 0.6 0.9 0.4  PROT 8.6* 6.5 7.8 7.4  ALBUMIN 3.9 2.9* 3.4* 3.2*   Recent Labs  Lab 02/07/21 1202  LIPASE 24   No results for input(s): AMMONIA in the last 168 hours. Coagulation Profile: No results for input(s): INR, PROTIME in the last 168 hours. Cardiac Enzymes: No results for input(s): CKTOTAL, CKMB, CKMBINDEX, TROPONINI in the last 168 hours. BNP (last 3 results) No results for input(s): PROBNP in the last 8760 hours. HbA1C: No results for input(s): HGBA1C in the last 72 hours. CBG: No results for input(s): GLUCAP in the last 168 hours. Lipid Profile: No results for input(s): CHOL, HDL, LDLCALC, TRIG, CHOLHDL, LDLDIRECT in the last 72 hours. Thyroid Function Tests: No results for input(s): TSH, T4TOTAL, FREET4, T3FREE, THYROIDAB in the last 72 hours. Anemia Panel: Recent Labs    02/10/21 0310  VITAMINB12 1,140*  FOLATE 36.9  FERRITIN 140  TIBC 215*  IRON 26*  RETICCTPCT 1.1   Sepsis Labs: No results for input(s): PROCALCITON, LATICACIDVEN in the last 168 hours.  Recent Results (from the past 240 hour(s))   Resp Panel by RT-PCR (Flu A&B, Covid) Nasopharyngeal Swab     Status: None   Collection Time: 02/08/21  8:50 AM   Specimen: Nasopharyngeal Swab; Nasopharyngeal(NP) swabs in vial transport medium  Result Value Ref Range Status   SARS Coronavirus 2 by RT PCR NEGATIVE NEGATIVE Final    Comment: (NOTE) SARS-CoV-2 target nucleic acids are NOT DETECTED.  The SARS-CoV-2 RNA is generally detectable in upper respiratory specimens during the acute phase of infection. The lowest concentration of SARS-CoV-2 viral copies this assay can detect is 138 copies/mL. A negative result does not preclude SARS-Cov-2 infection and should not be used as the sole basis for treatment or other patient management decisions. A negative result may occur with  improper specimen collection/handling, submission of specimen other than nasopharyngeal swab, presence of viral mutation(s) within the areas targeted by this assay, and inadequate number of viral copies(<138 copies/mL). A negative result must be combined with clinical observations, patient history, and epidemiological information. The expected result is Negative.  Fact Sheet for Patients:  EntrepreneurPulse.com.au  Fact Sheet for Healthcare Providers:  IncredibleEmployment.be  This test is no t yet approved or cleared by the [place] FDA and  has been authorized for detection and/or diagnosis of SARS-CoV-2 by FDA under an Emergency Use Authorization (EUA). This EUA will remain  in effect (meaning this test can be used) for the duration of the COVID-19 declaration under Section 564(b)(1) of the Act, 21 U.S.C.section 360bbb-3(b)(1), unless the authorization is terminated  or revoked sooner.       Influenza A by PCR NEGATIVE NEGATIVE Final   Influenza B by PCR NEGATIVE NEGATIVE Final    Comment: (NOTE) The Xpert Xpress SARS-CoV-2/FLU/RSV plus assay is intended as an aid in the diagnosis of influenza from  Nasopharyngeal swab specimens and should not be used as a sole basis for treatment. Nasal washings and aspirates are unacceptable for Xpert Xpress SARS-CoV-2/FLU/RSV testing.  Fact Sheet for Patients: EntrepreneurPulse.com.au  Fact Sheet for Healthcare Providers: IncredibleEmployment.be  This test is not yet approved or cleared by the [place] FDA and has been authorized for detection and/or diagnosis of SARS-CoV-2 by FDA under an Emergency Use Authorization (EUA). This EUA will remain in effect (meaning this test can be used) for the duration of the COVID-19 declaration  under Section 564(b)(1) of the Act, 21 U.S.C. section 360bbb-3(b)(1), unless the authorization is terminated or revoked.  Performed at [facility], [place] [address] [place], [place] [address]   Surgical PCR screen     Status: None   Collection Time: 02/09/21 11:00 PM   Specimen: Nasal Mucosa; Nasal Swab  Result Value Ref Range Status   MRSA, PCR NEGATIVE NEGATIVE Final   Staphylococcus aureus NEGATIVE NEGATIVE Final    Comment: (NOTE) The Xpert SA Assay (FDA approved for NASAL specimens in patients 62 years of age and older), is one component of a comprehensive surveillance program. It is not intended to diagnose infection nor to guide or monitor treatment. Performed at [facility], [place] [address], [place], [place] [address]           Radiology Studies: No results found.      Scheduled Meds:  acetaminophen  1,000 mg Oral Q6H   enoxaparin (LOVENOX) injection  40 mg Subcutaneous Q24H   ketorolac  30 mg Intravenous Q8H   methocarbamol  750 mg Oral TID   multivitamin with minerals  1 tablet Oral Daily   mupirocin ointment  1 application Nasal BID   pantoprazole (PROTONIX) IV  40 mg Intravenous Q12H   sodium chloride flush  3 mL Intravenous Q12H   Continuous Infusions:  lactated ringers 75 mL/hr at 02/11/21 0934    magnesium sulfate bolus IVPB     potassium chloride       LOS: 3 days    Time spent:40 min    Andromeda Poppen, Geraldo Docker, MD [facility]   If 7PM-7AM, please contact night-coverage 02/11/2021, 11:33 AM

## 2021-02-11 NOTE — TOC Initial Note (Addendum)
Transition of Care [facility]) - Initial/Assessment Note    Patient Details  Name: Barbara Valdez MRN: [id number] Date of Birth: 1975/02/24  Transition of Care (TOC) CM/SW Contact:    Lennart Pall, LCSW Phone Number: 02/11/2021, 11:05 AM  Clinical Narrative:                 Met with pt today to introduce self/ TOC role and review anticipated dc needs.  Pt reports that she is currently without a PCP and is uninsured, however, she will be active with [facility] insurance as of 02/16/21.  We discussed possible referrals to a [facility] PCP clinic and she would like assistance with this - have secured a new patient appointment for her at [place] on 03/02/21 @ 3:30pm with Freeman Caldron, PA-C.  Will refer her to [facility] program as well as she will likely dc from hospital prior to 1/25.  We have also discussed possible HHRN needs and pt would like to have this support if I am able to secure under charity care - will reach out to agencies.  Expected Discharge Plan: Home/Self Care Barriers to Discharge: Continued Medical Work up   Patient Goals and CMS Choice Patient states their goals for this hospitalization and ongoing recovery are:: return home      Expected Discharge Plan and Services Expected Discharge Plan: Home/Self Care In-house Referral: Clinical Social Work     Living arrangements for the past 2 months: Single Family Home                                      Prior Living Arrangements/Services Living arrangements for the past 2 months: Single Family Home Lives with:: Adult Children Patient language and need for interpreter reviewed:: Yes Do you feel safe going back to the place where you live?: Yes      Need for Family Participation in Patient Care: Yes (Comment) Care giver support system in place?: Yes (comment)   Criminal Activity/Legal Involvement Pertinent to Current Situation/Hospitalization: No - Comment as needed  Activities of Daily Living Home Assistive  Devices/Equipment: None ADL Screening (condition at time of admission) Patient's cognitive ability adequate to safely complete daily activities?: Yes Is the patient deaf or have difficulty hearing?: No Does the patient have difficulty seeing, even when wearing glasses/contacts?: No Does the patient have difficulty concentrating, remembering, or making decisions?: No Patient able to express need for assistance with ADLs?: Yes Does the patient have difficulty dressing or bathing?: No Independently performs ADLs?: Yes (appropriate for developmental age) Does the patient have difficulty walking or climbing stairs?: No Weakness of Legs: Both Weakness of Arms/Hands: Both  Permission Sought/Granted Permission sought to share information with : Other (comment) (medical clinic referral)                Emotional Assessment Appearance:: Appears stated age Attitude/Demeanor/Rapport: Gracious Affect (typically observed): Accepting Orientation: : Oriented to Self, Oriented to Place, Oriented to  Time, Oriented to Situation Alcohol / Substance Use: Not Applicable Psych Involvement: No (comment)  Admission diagnosis:  Colitis [K52.9] Generalized abdominal pain [R10.84] History of colitis [Z87.19] Patient Active Problem List   Diagnosis Date Noted   Colitis 02/07/2021   Constipation 12/01/2020   Colitis with rectal bleeding 11/27/2020   Post-operative state 09/26/2016   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

## 2021-02-11 NOTE — Progress Notes (Signed)
[facility] Progress Note  Barbara Valdez 46 y.o. 1975-03-19  CC:  Colonic mass   Subjective: Patient states she is doing well today.  Still not interested in eating anything, though tolerating liquids without difficulty.  Denies abdominal pain, nausea, vomiting.  ROS : Review of Systems  Gastrointestinal:  Negative for abdominal pain, blood in stool, constipation, diarrhea, heartburn, melena, nausea and vomiting.  Genitourinary:  Negative for dysuria and urgency.     Objective: Vital signs in last 24 hours: Vitals:   02/11/21 0453 02/11/21 0844  BP: 100/78 103/77  Pulse: 80 77  Resp: 17 16  Temp: 98.3 F (36.8 C) 98.5 F (36.9 C)  SpO2: 99% 100%    Physical Exam:  General:  Alert, cooperative, no distress, appears stated age  Head:  Normocephalic, without obvious abnormality, atraumatic  Eyes:  Anicteric sclera, EOM's intact, conjunctival pallor  Lungs:   Clear to auscultation bilaterally, respirations unlabored  Heart:  Regular rate and rhythm, S1, S2 normal  Abdomen:   Soft, non-tender, bowel sounds active all four quadrants, surgical drain right side with small amount of red tinted serous drainage     Lab Results: Recent Labs    02/10/21 0310 02/10/21 1805 02/11/21 0303  NA 139 140 138  K 2.8* 3.5 3.6  CL 105 106 105  CO2 23 24 26   GLUCOSE 129* 151* 117*  BUN 9 10 10   CREATININE 0.60 0.62 0.66  CALCIUM 9.2 8.5* 8.4*  MG 2.2  --  1.8  PHOS 3.9  --  3.1   Recent Labs    02/09/21 0318 02/10/21 0310  AST 12* 11*  ALT 8 9  ALKPHOS 54 57  BILITOT 0.9 0.4  PROT 7.8 7.4  ALBUMIN 3.4* 3.2*   Recent Labs    02/10/21 0310 02/11/21 0303  WBC 7.9 9.4  NEUTROABS 6.6 7.2  HGB 10.6* 9.0*  HCT 34.9* 29.7*  MCV 79.3* 79.2*  PLT 375 331   No results for input(s): LABPROT, INR in the last 72 hours.    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  Congested, inflamed, and thickened folds of mucosa in the rectosigmoid colon, biopsy showed mild acute nonspecific colitis. 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 Diet per surgical team. [facility] will follow  Garnette Scheuermann PA-C 02/11/2021, 9:33 AM  Contact #  [phone]

## 2021-02-11 NOTE — Progress Notes (Signed)
Patient ID: Barbara Valdez, female   DOB: 1975-05-23, 46 y.o.   MRN: [id number] [facility] Progress Note  1 Day Post-Op  Subjective: CC-  Sitting up in chair. Denies pain. Tolerating sips of clears. Having ostomy output. Plans to walk later, asking about her foley.   Objective: Vital signs in last 24 hours: Temp:  [97.7 F (36.5 C)-98.5 F (36.9 C)] 98.5 F (36.9 C) (01/20 0844) Pulse Rate:  [77-100] 77 (01/20 0844) Resp:  [12-19] 16 (01/20 0844) BP: (100-133)/(73-90) 103/77 (01/20 0844) SpO2:  [94 %-100 %] 100 % (01/20 0844) Last BM Date: 02/11/21 (ostomy)  Intake/Output from previous day: 01/19 0701 - 01/20 0700 In: 4390.9 [I.V.:3790.9; IV Piggyback:600] Out: 2760 [Urine:550; Drains:170; Stool:1500; Blood:500] Intake/Output this shift: Total I/O In: 201.8 [I.V.:201.8] Out: 110 [Drains:10; Stool:100]  PE: Gen:  Alert, NAD, pleasant Card:  RRR Pulm:  rate and effort normal on room air Abd: Soft, overall nontender, RLQ drain SS, midline incision with honeycome, L colostomy with gas a semisolid brown stool in pouch GU: foley w/ clear yellow urine    Lab Results:  Recent Labs    02/10/21 0310 02/11/21 0303  WBC 7.9 9.4  HGB 10.6* 9.0*  HCT 34.9* 29.7*  PLT 375 331   BMET Recent Labs    02/10/21 1805 02/11/21 0303  NA 140 138  K 3.5 3.6  CL 106 105  CO2 24 26  GLUCOSE 151* 117*  BUN 10 10  CREATININE 0.62 0.66  CALCIUM 8.5* 8.4*   PT/INR No results for input(s): LABPROT, INR in the last 72 hours. CMP     Component Value Date/Time   NA 138 02/11/2021 0303   K 3.6 02/11/2021 0303   CL 105 02/11/2021 0303   CO2 26 02/11/2021 0303   GLUCOSE 117 (H) 02/11/2021 0303   BUN 10 02/11/2021 0303   CREATININE 0.66 02/11/2021 0303   CALCIUM 8.4 (L) 02/11/2021 0303   PROT 7.4 02/10/2021 0310   ALBUMIN 3.2 (L) 02/10/2021 0310   AST 11 (L) 02/10/2021 0310   ALT 9 02/10/2021 0310   ALKPHOS 57 02/10/2021 0310   BILITOT 0.4 02/10/2021 0310    GFRNONAA >60 02/11/2021 0303   GFRAA >60 06/17/2016 1145   Lipase     Component Value Date/Time   LIPASE 24 02/07/2021 1202   Studies/Results: No results found.  Anti-infectives: Anti-infectives (From admission, onward)    Start     Dose/Rate Route Frequency Ordered Stop   02/10/21 0600  cefoTEtan (CEFOTAN) 2 g in sodium chloride 0.9 % 100 mL IVPB        2 g 200 mL/hr over 30 Minutes Intravenous On call to O.R. 02/09/21 1431 02/10/21 1104        Assessment/Plan Sigmoid colon mass, likely malignant - colonoscopy 02/09/21: Likely malignant partially obstructing tumor in the distal sigmoid colon (Biopsied, tattooed) and Congested, inflamed and thickened folds of the mucosa in the recto-sigmoid colon (Biopsied. Tattooed). Path w/ adenomatous lesion with high grade dysplasia. Rectal bx colitis.    s/p Laparoscopy and open sigmoid colectomy with end pouch [name] procedure) 02/10/21 Dr. Hassell Done - CEA 16.8  - follow surgical path  - having gas and stool via colostomy, allow CLD. - D/C Foley - Will also need CT chest to evaluate for any metastatic disease - mobilize   FEN - CLD, prealbumin is 9.0, encourage protein shakes VTE - SCDs, okay for chemical propylaxis from a general surgery standpoint ID - none Foley- remove today  low Medical Decision Making  Anemia Malnutrition   LOS: 3 days    [facility] 02/11/2021, 9:48 AM Please see Amion for pager number during day hours 7:00am-4:30pm

## 2021-02-11 NOTE — Plan of Care (Signed)

## 2021-02-11 NOTE — Consult Note (Signed)
[place] Nurse ostomy follow up Patient receiving care in [place].  Stoma type/location: LUQ colostomy Stomal assessment/size: 1.5 inches, round, budded, stitches intact Peristomal assessment: intact Treatment options for stomal/peristomal skin: barrier ring Output: liquid brown Ostomy pouching: 2pc. 2 1/4 inch pouch, skin barrier, barrier ring Education provided: Patient participated in the entire pouch change process, including all steps of removal, system preparation and application, as well as emptying. Enrolled patient in [facility] Discharge program: Yes, today.   5 pouches, skin barriers, and barrier rooms in room for future use/discharge.  Neither daughter was present for teaching, but patient wished to proceed independently, which we did.  The patient did not express any apprehension or hesitation of processes, and she expressed confidence she will manage without difficulty when home.  Val Riles, RN, MSN, CWOCN, CNS-BC, pager [phone]

## 2021-02-11 NOTE — Plan of Care (Signed)
  Problem: Education: Goal: Knowledge of General Education information will improve Description: Including pain rating scale, medication(s)/side effects and non-pharmacologic comfort measures Outcome: Progressing   Problem: Pain Managment: Goal: General experience of comfort will improve Outcome: Progressing   

## 2021-02-12 DIAGNOSIS — Z7189 Other specified counseling: Secondary | ICD-10-CM

## 2021-02-12 DIAGNOSIS — Z515 Encounter for palliative care: Secondary | ICD-10-CM

## 2021-02-12 LAB — CBC WITH DIFFERENTIAL/PLATELET
Abs Immature Granulocytes: 0.15 10*3/uL — ABNORMAL HIGH (ref 0.00–0.07)
Basophils Absolute: 0.1 10*3/uL (ref 0.0–0.1)
Basophils Relative: 1 %
Eosinophils Absolute: 0 10*3/uL (ref 0.0–0.5)
Eosinophils Relative: 0 %
HCT: 28.8 % — ABNORMAL LOW (ref 36.0–46.0)
Hemoglobin: 8.6 g/dL — ABNORMAL LOW (ref 12.0–15.0)
Immature Granulocytes: 1 %
Lymphocytes Relative: 17 %
Lymphs Abs: 1.8 10*3/uL (ref 0.7–4.0)
MCH: 24 pg — ABNORMAL LOW (ref 26.0–34.0)
MCHC: 29.9 g/dL — ABNORMAL LOW (ref 30.0–36.0)
MCV: 80.2 fL (ref 80.0–100.0)
Monocytes Absolute: 0.6 10*3/uL (ref 0.1–1.0)
Monocytes Relative: 6 %
Neutro Abs: 8 10*3/uL — ABNORMAL HIGH (ref 1.7–7.7)
Neutrophils Relative %: 75 %
Platelets: 356 10*3/uL (ref 150–400)
RBC: 3.59 MIL/uL — ABNORMAL LOW (ref 3.87–5.11)
RDW: 17.4 % — ABNORMAL HIGH (ref 11.5–15.5)
WBC: 10.6 10*3/uL — ABNORMAL HIGH (ref 4.0–10.5)
nRBC: 0 % (ref 0.0–0.2)

## 2021-02-12 LAB — MAGNESIUM: Magnesium: 2.4 mg/dL (ref 1.7–2.4)

## 2021-02-12 LAB — COMPREHENSIVE METABOLIC PANEL
ALT: 8 U/L (ref 0–44)
AST: 14 U/L — ABNORMAL LOW (ref 15–41)
Albumin: 2.7 g/dL — ABNORMAL LOW (ref 3.5–5.0)
Alkaline Phosphatase: 42 U/L (ref 38–126)
Anion gap: 6 (ref 5–15)
BUN: 10 mg/dL (ref 6–20)
CO2: 27 mmol/L (ref 22–32)
Calcium: 8.2 mg/dL — ABNORMAL LOW (ref 8.9–10.3)
Chloride: 105 mmol/L (ref 98–111)
Creatinine, Ser: 0.68 mg/dL (ref 0.44–1.00)
GFR, Estimated: >60 mL/min (ref >60)
Glucose, Bld: 91 mg/dL (ref 70–99)
Potassium: 4.1 mmol/L (ref 3.5–5.1)
Sodium: 138 mmol/L (ref 135–145)
Total Bilirubin: 0.5 mg/dL (ref 0.3–1.2)
Total Protein: 5.7 g/dL — ABNORMAL LOW (ref 6.5–8.1)

## 2021-02-12 LAB — PHOSPHORUS: Phosphorus: 2.1 mg/dL — ABNORMAL LOW (ref 2.5–4.6)

## 2021-02-12 MED ORDER — DEXTROSE 5 % IV SOLN
15.0000 mmol | Freq: Once | INTRAVENOUS | Status: AC
  Administered 2021-02-12: 15 mmol via INTRAVENOUS
  Filled 2021-02-12: qty 5

## 2021-02-12 NOTE — Progress Notes (Signed)
2 Days Post-Op   Subjective/Chief Complaint: No complaints   Objective: Vital signs in last 24 hours: Temp:  [98.3 F (36.8 C)-99.1 F (37.3 C)] 98.3 F (36.8 C) (01/21 0637) Pulse Rate:  [84-103] 95 (01/21 0637) Resp:  [16-20] 16 (01/21 0637) BP: (95-101)/(63-75) 101/75 (01/21 0637) SpO2:  [99 %-100 %] 99 % (01/21 0637) Last BM Date: 02/11/21  Intake/Output from previous day: 01/20 0701 - 01/21 0700 In: 3266.8 [P.O.:1200; I.V.:1614.4; IV Piggyback:452.3] Out: 803 [Urine:100; Drains:353; Stool:350] Intake/Output this shift: Total I/O In: 296 [I.V.:296] Out: 70 [Drains:70]  General appearance: alert and cooperative Resp: clear to auscultation bilaterally Cardio: regular rate and rhythm GI: soft, mild tenderness. Ostomy pink and productive. Incision ok  Lab Results:  Recent Labs    02/11/21 0303 02/12/21 0302  WBC 9.4 10.6*  HGB 9.0* 8.6*  HCT 29.7* 28.8*  PLT 331 356   BMET Recent Labs    02/11/21 0303 02/12/21 0302  NA 138 138  K 3.6 4.1  CL 105 105  CO2 26 27  GLUCOSE 117* 91  BUN 10 10  CREATININE 0.66 0.68  CALCIUM 8.4* 8.2*   PT/INR No results for input(s): LABPROT, INR in the last 72 hours. ABG No results for input(s): PHART, HCO3 in the last 72 hours.  Invalid input(s): PCO2, PO2  Studies/Results: CT CHEST W CONTRAST  Result Date: 02/11/2021 CLINICAL DATA:  Perforated colonic malignancy, initial staging examination, CNS neoplasm EXAM: CT CHEST WITH CONTRAST TECHNIQUE: Multidetector CT imaging of the chest was performed during intravenous contrast administration. RADIATION DOSE REDUCTION: This exam was performed according to the departmental dose-optimization program which includes automated exposure control, adjustment of the mA and/or kV according to patient size and/or use of iterative reconstruction technique. CONTRAST:  47m OMNIPAQUE IOHEXOL 300 MG/ML  SOLN COMPARISON:  None. FINDINGS: Cardiovascular: No significant vascular findings.  Normal heart size. No pericardial effusion. Mediastinum/Nodes: No enlarged mediastinal, hilar, or axillary lymph nodes. Thyroid gland, trachea, and esophagus demonstrate no significant findings. Lungs/Pleura: Bibasilar atelectasis. No focal pulmonary nodules or infiltrates. No pneumothorax or pleural effusion. Central airways are widely patent. Upper Abdomen: Hypoattenuating lesion within the right hepatic dome compatible with a benign cavernous hemangioma, better characterized on prior CT examination of the abdomen pelvis of 02/07/2021. Multiple mildly dilated gas-filled loops of small bowel are seen within the left upper quadrant suggestive of a postoperative adynamic ileus, not well assessed on this examination. The stomach is not distended. No acute abnormality. Musculoskeletal: No acute bone abnormality. No lytic or blastic bone lesions are identified. IMPRESSION: No evidence of intrathoracic metastatic disease. Mildly dilated loops of small bowel within the upper abdomen suggestive of a mild postoperative adynamic ileus. Electronically Signed   By: AFidela SalisburyM.D.   On: 02/11/2021 20:49    Anti-infectives: Anti-infectives (From admission, onward)    Start     Dose/Rate Route Frequency Ordered Stop   02/10/21 0600  cefoTEtan (CEFOTAN) 2 g in sodium chloride 0.9 % 100 mL IVPB        2 g 200 mL/hr over 30 Minutes Intravenous On call to O.R. 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 Tattooed). Path w/ adenomatous lesion with high grade dysplasia. Rectal bx colitis.  s/p Laparoscopy and open sigmoid colectomy with end pouch [name] procedure)  02/10/21 Dr. Hassell Done - CEA 16.8  - follow surgical path  - having gas and stool via colostomy, allow fulls - D/C Foley - Will also need CT chest to evaluate for any metastatic disease - mobilize    FEN - CLD, prealbumin is 9.0, encourage protein shakes VTE - SCDs, okay for chemical propylaxis from a general surgery standpoint ID - none Foley- remove today   LOS: 4 days    Barbara Valdez 02/12/2021

## 2021-02-12 NOTE — Progress Notes (Signed)
PROGRESS NOTE    Barbara Valdez  XLK:[id number] DOB: [birth year] DOA: 02/07/2021 PCP: Pcp, No     Brief Narrative:  46 y.o. BF PMHx fibroids, iron deficiency anemia, colitis, anxiety   Presents with recurrent abdominal pain.  Recent admission November 2022 discharged on antibiotics but did not complete the course due to side effects of nausea, has been attempting to have outpatient colonoscopy for the past 3 months with multiple delays in the setting of insurance and scheduling.    Presents to the ED on 02/07/2021 with profoundly worsening abdominal pain, imaging concerning for colitis.   Subjective: 1/21 A/O x4.  Pain controlled.    Assessment & Plan: Covid vaccination;   Principal Problem:   Colitis Active Problems:   Iron deficiency anemia   Colon cancer ([place])  SBO with rectosigmoid junction abscess/COLON cancer - Discussed case with Dr. Alessandra Bevels GI who performed colonoscopy, and feels that the mass like lesion in the distal sigmoid colon most likely cancerous.  His team is already contacted general surgery. -1/18 surgery recommends; partial colectomy/ colostomy this admission.  Have scheduled for 1/19 -1/18 biopsy from sigmoid colon C/W Adenomatous lesion with extensive high-grade dysplasia.  See results below -1/19 s/p colectomy: Invasive cancer.  See results below -1/20 CT chest W contrast: Negative metastatic disease see results below  -Discussed case with Dr.Praveena Iruku Oncology who will see patient on Monday if final pathology has resulted to discuss plan of care.  Otherwise one of her colleagues will see on Tuesday.  Acute on chronic recurrent colitis POA/Acute abdominal pain -GI following,  -Multiple delays outpatient setting due to scheduling, poor compliance with bowel prep and insurance issues -1/18 colonoscopy reveals most likely cancerous mass see SBO  -Continue antibiotics per GI   Anemia, microcytic -Chronic questionably anemia of chronic disease and  malabsorption given above -1/18 anemia panel; appears to be mixed picture. - 1/20 will consider possible IV iron prior to chemotherapy.  Will await stabilization over next 48 hours post surgery, may consult oncology prior to administration. - Continue to monitor hemoglobin level closely  Lab Results  Component Value Date   HGB 8.6 (L) 02/12/2021   HGB 9.0 (L) 02/11/2021   HGB 10.6 (L) 02/10/2021   HGB 10.8 (L) 02/09/2021   HGB 9.6 (L) 02/08/2021  -Transfuse for hemoglobin<7  Hypokalemia - Potassium goal> 4   Hypomagnesmia - Magnesium goal> 2  Hypophosphatemia - Phosphorus goal> 2.5 - Sodium phosphate IV 15 mmol   Goals of care - 1/20 Palliative Care Consult: Young patient with new onset colon cancer, discuss goals of care,HCPOA.     DVT prophylaxis: Lovenox per pharmacy Code Status: Full Family Communication: 1/21 aunt and several other family members present at bedside for discussion of plan of care all questions answered Status is: Inpatient    Dispo: The patient is from: Home              Anticipated d/c is to:                 Anticipated d/c date is: > 3 days              Patient currently is not medically stable to d/c.      Consultants:  [facility] General surgery Dr.Praveena Iruku Oncology    Procedures/Significant Events:  1/18 Colonoscopy: COLON MASS, SIGMOID, BIOPSY:  - Adenomatous lesion with extensive high-grade dysplasia, see comment   B. 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 -Mildly dilated loops of small bowel within the upper abdomen suggestive of a mild postoperative adynamic ileus.   I have  personally reviewed and interpreted all radiology studies and my findings are as above.  VENTILATOR SETTINGS:    Cultures 1/18 colonic biopsy, pathology pending  Antimicrobials: Anti-infectives (From admission, onward)    Start     Ordered Stop   02/10/21 0600  cefoTEtan (CEFOTAN) 2 g in sodium chloride 0.9 % 100 mL IVPB        02/09/21 1431 02/11/21 0559         Devices    LINES / TUBES:      Continuous Infusions:  lactated ringers 75 mL/hr at 02/12/21 0930     Objective: Vitals:   02/11/21 1339 02/11/21 1349 02/12/21 0637 02/12/21 1315  BP: 95/63 101/69 101/75 106/77  Pulse: (!) 103 84 95 92  Resp: 20 16 16 15   Temp: 98.5 F (36.9 C) 99.1 F (37.3 C) 98.3 F (36.8 C) 98.4 F (36.9 C)  TempSrc: Oral Oral Oral Oral  SpO2: 99% 100% 99% 100%  Weight:      Height:        Intake/Output Summary (Last 24 hours) at 02/12/2021 1319 Last data filed at 02/12/2021 1100 Gross per 24 hour  Intake 2672.63 ml  Output 473 ml  Net 2199.63 ml    Filed Weights   02/07/21 1142  Weight: 60.8 kg    Examination: General: A/O x4, No acute respiratory distress Eyes: negative scleral hemorrhage, negative anisocoria, negative icterus ENT: Negative Runny nose, negative gingival bleeding, Neck:  Negative scars, masses, torticollis, lymphadenopathy, JVD Lungs: Clear to auscultation bilaterally without wheezes or crackles Cardiovascular: Regular rate and rhythm without murmur gallop or rub normal S1 and S2 Abdomen: Positive abdominal pain epigastric with palpation, nondistended, negative bowel sounds, no rebound, no ascites, no appreciable mass Extremities: No significant cyanosis, clubbing, or edema bilateral lower extremities Skin: Negative rashes, lesions, ulcers Psychiatric:  Negative depression, negative anxiety, negative fatigue, negative mania  Central nervous system:  Cranial nerves II through XII intact, tongue/uvula midline, all extremities muscle strength 5/5,  sensation intact throughout, negative dysarthria, negative expressive aphasia, negative receptive aphasia.    Data Reviewed: Care during the described time interval was provided by me .  I have reviewed this patient's available data, including medical history, events of note, physical examination, and all test results as part of my evaluation.  CBC: Recent Labs  Lab 02/08/21 0409 02/09/21 0318 02/10/21 0310 02/11/21 0303 02/12/21 0302  WBC 7.0 9.7 7.9 9.4 10.6*  NEUTROABS  --   --  6.6 7.2 8.0*  HGB 9.6* 10.8* 10.6* 9.0* 8.6*  HCT 32.0* 36.4 34.9* 29.7* 28.8*  MCV 79.6* 80.7 79.3* 79.2* 80.2  PLT 288 335 375 331 016    Basic Metabolic Panel: Recent Labs  Lab 02/09/21 0318 02/10/21 0310 02/10/21 1805 02/11/21 0303 02/12/21 0302  NA 135 139 140 138 138  K 3.7 2.8* 3.5 3.6 4.1  CL 104 105 106 105 105  CO2 19* 23 24 26 27   GLUCOSE 123* 129* 151* 117* 91  BUN 8 9 10 10 10   CREATININE 0.63 0.60 0.62 0.66 0.68  CALCIUM 8.9 9.2 8.5* 8.4* 8.2*  MG  --  2.2  --  1.8 2.4  PHOS  --  3.9  --  3.1 2.1*    GFR: Estimated Creatinine Clearance: 84 mL/min (by C-G formula based on SCr of 0.68 mg/dL). Liver Function Tests: Recent Labs  Lab 02/07/21 1202 02/08/21 0409 02/09/21 0318 02/10/21 0310 02/12/21 0302  AST 14* 10* 12* 11* 14*  ALT 9 8 8 9 8   ALKPHOS 61 49 54 57 42  BILITOT 0.6 0.6 0.9 0.4 0.5  PROT 8.6* 6.5 7.8 7.4 5.7*  ALBUMIN 3.9 2.9* 3.4* 3.2* 2.7*    Recent Labs  Lab 02/07/21 1202  LIPASE 24    No results for input(s): AMMONIA in the last 168 hours. Coagulation Profile: No results for input(s): INR, PROTIME in the last 168 hours. Cardiac Enzymes: No results for input(s): CKTOTAL, CKMB, CKMBINDEX, TROPONINI in the last 168 hours. BNP (last 3 results) No results for input(s): PROBNP in the last 8760 hours. HbA1C: No results for input(s): HGBA1C in the last 72 hours. CBG: No results for input(s): GLUCAP in the last 168 hours. Lipid Profile: No  results for input(s): CHOL, HDL, LDLCALC, TRIG, CHOLHDL, LDLDIRECT in the last 72 hours. Thyroid Function Tests: No results for input(s): TSH, T4TOTAL, FREET4, T3FREE, THYROIDAB in the last 72 hours. Anemia Panel: Recent Labs    02/10/21 0310  VITAMINB12 1,140*  FOLATE 36.9  FERRITIN 140  TIBC 215*  IRON 26*  RETICCTPCT 1.1    Sepsis Labs: No results for input(s): PROCALCITON, LATICACIDVEN in the last 168 hours.  Recent Results (from the past 240 hour(s))  Resp Panel by RT-PCR (Flu A&B, Covid) Nasopharyngeal Swab     Status: None   Collection Time: 02/08/21  8:50 AM   Specimen: Nasopharyngeal Swab; Nasopharyngeal(NP) swabs in vial transport medium  Result Value Ref Range Status   SARS Coronavirus 2 by RT PCR NEGATIVE NEGATIVE Final    Comment: (NOTE) SARS-CoV-2 target nucleic acids are NOT DETECTED.  The SARS-CoV-2 RNA is generally detectable in upper respiratory specimens during the acute phase of infection. The lowest concentration of SARS-CoV-2 viral copies this assay can detect is 138 copies/mL. A negative result does not preclude SARS-Cov-2 infection and should not be used as the sole basis for treatment or other patient management decisions. A negative result may occur with  improper specimen collection/handling, submission of specimen other than nasopharyngeal swab, presence of viral mutation(s) within the areas targeted by this assay, and inadequate number of viral copies(<138 copies/mL). A negative result must be combined with clinical observations, patient history, and epidemiological information. The expected result is Negative.  Fact Sheet for Patients:  EntrepreneurPulse.com.au  Fact Sheet for Healthcare Providers:  IncredibleEmployment.be  This test is no t yet approved or cleared by the [place] FDA and  has been authorized for detection and/or diagnosis of SARS-CoV-2 by FDA under an Emergency Use Authorization  (EUA). This EUA will remain  in effect (meaning this test can be used) for the duration of the COVID-19 declaration under Section 564(b)(1) of the Act, 21 U.S.C.section 360bbb-3(b)(1), unless the authorization is terminated  or revoked sooner.       Influenza A by PCR NEGATIVE NEGATIVE Final   Influenza B by PCR NEGATIVE NEGATIVE Final    Comment: (NOTE) The Xpert Xpress SARS-CoV-2/FLU/RSV plus assay is intended as an aid in the diagnosis of influenza from Nasopharyngeal swab specimens and should not be used as a sole basis for treatment. Nasal washings and aspirates are unacceptable for Xpert Xpress SARS-CoV-2/FLU/RSV testing.  Fact Sheet for Patients: EntrepreneurPulse.com.au  Fact Sheet for Healthcare Providers: IncredibleEmployment.be  This test is not yet approved or cleared by the [place] FDA and has been authorized for detection and/or diagnosis of SARS-CoV-2 by FDA under an Emergency Use Authorization (EUA). This EUA will remain in effect (meaning this test can be used) for the duration of the COVID-19 declaration under Section 564(b)(1) of the Act, 21 U.S.C. section 360bbb-3(b)(1), unless the authorization is terminated or revoked.  Performed at [facility], [place] [address], [place], [place] [address]   Surgical PCR screen     Status: None   Collection Time: 02/09/21 11:00 PM   Specimen: Nasal Mucosa; Nasal Swab  Result Value Ref Range Status   MRSA, PCR NEGATIVE NEGATIVE Final   Staphylococcus aureus NEGATIVE NEGATIVE Final    Comment: (NOTE) The Xpert SA Assay (FDA approved for NASAL specimens in patients 24 years of age and older), is one component of a comprehensive surveillance program. It is not intended to diagnose infection nor to guide or monitor treatment. Performed at [facility], [place] [address] [place], [place] [address]           Radiology Studies: CT CHEST W  CONTRAST  Result Date: 02/11/2021 CLINICAL DATA:  Perforated colonic malignancy, initial staging examination, CNS neoplasm EXAM: CT CHEST WITH CONTRAST TECHNIQUE: Multidetector CT imaging of the chest was performed during intravenous contrast administration. RADIATION DOSE REDUCTION: This exam was performed according to the departmental dose-optimization program which includes automated exposure control, adjustment of the mA and/or kV according to patient size and/or use of iterative reconstruction technique. CONTRAST:  78m OMNIPAQUE IOHEXOL 300 MG/ML  SOLN COMPARISON:  None. FINDINGS: Cardiovascular: No significant vascular findings. Normal heart size. No pericardial effusion. Mediastinum/Nodes: No enlarged mediastinal, hilar, or axillary lymph nodes. Thyroid gland, trachea, and esophagus demonstrate no significant findings. Lungs/Pleura: Bibasilar atelectasis. No focal pulmonary nodules or infiltrates. No pneumothorax or pleural effusion. Central airways are widely patent. Upper Abdomen: Hypoattenuating lesion within the right hepatic dome compatible with a benign cavernous hemangioma, better characterized on prior CT examination of the abdomen pelvis of 02/07/2021. Multiple mildly dilated gas-filled loops of small bowel are seen within the left upper quadrant suggestive of a postoperative adynamic ileus, not well assessed on this examination. The stomach is not distended. No acute abnormality. Musculoskeletal: No acute bone abnormality. No lytic or blastic bone lesions are identified. IMPRESSION: No evidence of intrathoracic metastatic disease. Mildly dilated loops of small bowel within the upper abdomen suggestive of a mild postoperative adynamic ileus. Electronically Signed   By: AFidela SalisburyM.D.   On: 02/11/2021 20:49        Scheduled Meds:  acetaminophen  1,000 mg Oral Q6H   enoxaparin (LOVENOX) injection  40 mg Subcutaneous Q24H   ketorolac  30 mg Intravenous Q8H   methocarbamol  750 mg Oral  TID   multivitamin with minerals  1 tablet Oral Daily   mupirocin ointment  1 application Nasal BID   pantoprazole (PROTONIX) IV  40 mg Intravenous Q12H   sodium chloride flush  3 mL Intravenous Q12H   Continuous Infusions:  lactated ringers 75 mL/hr at 02/12/21 0930     LOS: 4 days    Time spent:40 min    Israel Wunder, CGeraldo Docker MD [facility]   If 7PM-7AM, please contact night-coverage 02/12/2021, 1:19 PM

## 2021-02-12 NOTE — Consult Note (Signed)
Consultation Note Date: 02/12/2021   Patient Name: Barbara Valdez  DOB: 27-Dec-1975  MRN: [id number]  Age / Sex: 46 y.o., female  PCP: Pcp, No Referring Physician: Allie Bossier, MD  Reason for Consultation: Establishing goals of care  HPI/Patient Profile: 46 y.o. female admitted on 02/07/2021    Clinical Assessment and Goals of Care: 46 year old lady with sigmoid colon mass, underwent colonoscopy on 02-09-2021.  Deemed to have a likely malignant partially obstructing tumor in the distal sigmoid colon, pathology with adenomatous lesion with high-grade dysplasia.  Patient is status post laparoscopy and open sigmoid colectomy with end pouch Hartmann procedure on 02-10-2021. No evidence of intrathoracic metastatic disease on chest CT.  A palliative consult has been requested for ongoing goals of care discussions. Patient is awake alert resting in bed, her aunt is present at the bedside.  I introduced myself and palliative care as follows: Palliative medicine is specialized medical care for people living with serious illness. It focuses on providing relief from the symptoms and stress of a serious illness. The goal is to improve quality of life for both the patient and the family. Goals of care: Broad aims of medical therapy in relation to the patient's values and preferences. Our aim is to provide medical care aimed at enabling patients to achieve the goals that matter most to them, given the circumstances of their particular medical situation and their constraints.  Offered active listening and supportive presence.  Questions answered to the best of my ability.  Patient asks about when her diet will be advanced, she asks how she will get ostomy pouches/wound care supplies at home.  Offered reassurance that patient will have a TOC consult and appropriate discharge options once she is closer to being discharged home.   Offered supportive care, brief life review performed.  See additional discussions below.  Thank you for the consult. NEXT OF KIN  Lives at home with daughter Mother   SUMMARY OF RECOMMENDATIONS    Full Code, Full scope care.  Patient asks when her diet will be advanced, she asks for soft diet.  Offered active listening and supportive presence.  Chaplain consult for additional support and to see if the patient would like to discuss about completing advance care planning documents.  Thank you for the consult.    Code Status/Advance Care Planning: Full code   Symptom Management:     Palliative Prophylaxis:  Frequent Pain Assessment  Additional Recommendations (Limitations, Scope, Preferences): Full Scope Treatment  Psycho-social/Spiritual:  Desire for further Chaplaincy support:yes Additional Recommendations: Caregiving  Support/Resources  Prognosis:  Unable to determine  Discharge Planning:  recommend home health care on discharge. Will recommend patient follow up with palliative NP at [facility] if she is referred there.        Primary Diagnoses: Present on Admission:  Colitis  Iron deficiency anemia  Colon cancer ([place])   I have reviewed the medical record, interviewed the patient and family, and examined the patient. The following aspects are pertinent.  Past Medical  History:  Diagnosis Date   Anemia    Anxiety    Complication of anesthesia    took a long time to wake up after surgery   Social History   Socioeconomic History   Marital status: Single    Spouse name: Not on file   Number of children: Not on file   Years of education: Not on file   Highest education level: Not on file  Occupational History   Not on file  Tobacco Use   Smoking status: Never   Smokeless tobacco: Never  Vaping Use   Vaping Use: Never used  Substance and Sexual Activity   Alcohol use: No   Drug use: No   Sexual activity: Yes    Birth control/protection:  Surgical  Other Topics Concern   Not on file  Social History Narrative   Not on file   Social Determinants of Health   Financial Resource Strain: Not on file  Food Insecurity: Not on file  Transportation Needs: Not on file  Physical Activity: Not on file  Stress: Not on file  Social Connections: Not on file   Family History  Problem Relation Age of Onset   Hyperlipidemia Father    Hypertension Other    Breast cancer Neg Hx    Scheduled Meds:  acetaminophen  1,000 mg Oral Q6H   enoxaparin (LOVENOX) injection  40 mg Subcutaneous Q24H   ketorolac  30 mg Intravenous Q8H   methocarbamol  750 mg Oral TID   multivitamin with minerals  1 tablet Oral Daily   mupirocin ointment  1 application Nasal BID   pantoprazole (PROTONIX) IV  40 mg Intravenous Q12H   sodium chloride flush  3 mL Intravenous Q12H   Continuous Infusions:  lactated ringers 75 mL/hr at 02/12/21 0930   PRN Meds:.morphine injection, ondansetron (ZOFRAN) IV, oxyCODONE Medications Prior to Admission:  Prior to Admission medications   Medication Sig Start Date End Date Taking? Authorizing Provider  Ginger, Zingiber officinalis, (GINGER ROOT PO) Take 4-6 fluid ounces by mouth See admin instructions. Mix ginger root with lemon and 4-6 ounces of water, boil, and drink the tea one to two times a day as needed for constipation   Yes [provider]  polyethylene glycol (MIRALAX / GLYCOLAX) 17 g packet Take 17 g by mouth 2 (two) times daily. Patient not taking: Reported on 02/07/2021 12/01/20   Mariel Aloe, MD  psyllium (METAMUCIL SMOOTH TEXTURE) 58.6 % powder Take 1 packet by mouth 2 (two) times daily. Patient not taking: Reported on 02/07/2021 12/01/20   Mariel Aloe, MD   No Known Allergies Review of Systems + denies pain.   Physical Exam Awake alert No distress Has ostomy Regular S 1 S 2  No edema  Vital Signs: BP 101/75 (BP Location: Left Arm)    Pulse 95    Temp 98.3 F (36.8 C) (Oral)    Resp 16     Ht 5\' 6"  (1.676 m)    Wt 60.8 kg    LMP 10/23/2016    SpO2 99%    BMI 21.63 kg/m  Pain Scale: 0-10 POSS *See Group Information*: 1-Acceptable,Awake and alert Pain Score: 3    SpO2: SpO2: 99 % O2 Device:SpO2: 99 % O2 Flow Rate: .   IO: Intake/output summary:  Intake/Output Summary (Last 24 hours) at 02/12/2021 1308 Last data filed at 02/12/2021 1100 Gross per 24 hour  Intake 2672.63 ml  Output 473 ml  Net 2199.63 ml  LBM: Last BM Date: 02/11/21 Baseline Weight: Weight: 60.8 kg Most recent weight: Weight: 60.8 kg     Palliative Assessment/Data:   PPS 50%  Time In:  12 Time Out:   1300 Time Total:  60  Greater than 50%  of this time was spent counseling and coordinating care related to the above assessment and plan.  Signed by: Loistine Chance, MD   Please contact Palliative Medicine Team phone at [phone] for questions and concerns.  For individual provider: See Shea Evans

## 2021-02-13 LAB — CBC WITH DIFFERENTIAL/PLATELET
Abs Immature Granulocytes: 0.09 10*3/uL — ABNORMAL HIGH (ref 0.00–0.07)
Basophils Absolute: 0 10*3/uL (ref 0.0–0.1)
Basophils Relative: 0 %
Eosinophils Absolute: 0.1 10*3/uL (ref 0.0–0.5)
Eosinophils Relative: 1 %
HCT: 27.6 % — ABNORMAL LOW (ref 36.0–46.0)
Hemoglobin: 8.2 g/dL — ABNORMAL LOW (ref 12.0–15.0)
Immature Granulocytes: 1 %
Lymphocytes Relative: 19 %
Lymphs Abs: 1.6 10*3/uL (ref 0.7–4.0)
MCH: 23.6 pg — ABNORMAL LOW (ref 26.0–34.0)
MCHC: 29.7 g/dL — ABNORMAL LOW (ref 30.0–36.0)
MCV: 79.5 fL — ABNORMAL LOW (ref 80.0–100.0)
Monocytes Absolute: 0.6 10*3/uL (ref 0.1–1.0)
Monocytes Relative: 7 %
Neutro Abs: 6.1 10*3/uL (ref 1.7–7.7)
Neutrophils Relative %: 72 %
Platelets: 372 10*3/uL (ref 150–400)
RBC: 3.47 MIL/uL — ABNORMAL LOW (ref 3.87–5.11)
RDW: 17.6 % — ABNORMAL HIGH (ref 11.5–15.5)
WBC: 8.5 10*3/uL (ref 4.0–10.5)
nRBC: 0 % (ref 0.0–0.2)

## 2021-02-13 LAB — COMPREHENSIVE METABOLIC PANEL
ALT: 9 U/L (ref 0–44)
AST: 16 U/L (ref 15–41)
Albumin: 2.5 g/dL — ABNORMAL LOW (ref 3.5–5.0)
Alkaline Phosphatase: 42 U/L (ref 38–126)
Anion gap: 7 (ref 5–15)
BUN: 8 mg/dL (ref 6–20)
CO2: 25 mmol/L (ref 22–32)
Calcium: 8.1 mg/dL — ABNORMAL LOW (ref 8.9–10.3)
Chloride: 105 mmol/L (ref 98–111)
Creatinine, Ser: 0.67 mg/dL (ref 0.44–1.00)
GFR, Estimated: >60 mL/min (ref >60)
Glucose, Bld: 113 mg/dL — ABNORMAL HIGH (ref 70–99)
Potassium: 3.6 mmol/L (ref 3.5–5.1)
Sodium: 137 mmol/L (ref 135–145)
Total Bilirubin: 0.5 mg/dL (ref 0.3–1.2)
Total Protein: 5.4 g/dL — ABNORMAL LOW (ref 6.5–8.1)

## 2021-02-13 LAB — MAGNESIUM: Magnesium: 2 mg/dL (ref 1.7–2.4)

## 2021-02-13 LAB — PHOSPHORUS: Phosphorus: 4 mg/dL (ref 2.5–4.6)

## 2021-02-13 NOTE — Plan of Care (Signed)
  Problem: Coping: Goal: Level of anxiety will decrease Outcome: Progressing   Problem: Elimination: Goal: Will not experience complications related to bowel motility Outcome: Progressing Goal: Will not experience complications related to urinary retention Outcome: Progressing   Problem: Pain Managment: Goal: General experience of comfort will improve Outcome: Progressing   

## 2021-02-13 NOTE — Progress Notes (Signed)
PROGRESS NOTE    Barbara Valdez  WVP:[id number] DOB: 1975-04-16 DOA: 02/07/2021 PCP: Pcp, No     Brief Narrative:  46 y.o. BF PMHx fibroids, iron deficiency anemia, colitis, anxiety   Presents with recurrent abdominal pain.  Recent admission November 2022 discharged on antibiotics but did not complete the course due to side effects of nausea, has been attempting to have outpatient colonoscopy for the past 3 months with multiple delays in the setting of insurance and scheduling.    Presents to the ED on 02/07/2021 with profoundly worsening abdominal pain, imaging concerning for colitis.   Subjective: 1/22 afebrile overnight      Assessment & Plan: Covid vaccination;   Principal Problem:   Colitis Active Problems:   Iron deficiency anemia   Colon cancer ([place])  SBO with rectosigmoid junction abscess/COLON cancer - Discussed case with Dr. Alessandra Bevels GI who performed colonoscopy, and feels that the mass like lesion in the distal sigmoid colon most likely cancerous.  His team is already contacted general surgery. -1/18 surgery recommends; partial colectomy/ colostomy this admission.  Have scheduled for 1/19 -1/18 biopsy from sigmoid colon C/W Adenomatous lesion with extensive high-grade dysplasia.  See results below -1/19 s/p colectomy: Invasive cancer.  See results below -1/20 CT chest W contrast: Negative metastatic disease see results below  -Discussed case with Dr.Praveena Iruku Oncology who will see patient on Monday if final pathology has resulted to discuss plan of care.  Otherwise one of her colleagues will see on Tuesday.  Acute on chronic recurrent colitis POA/Acute abdominal pain -GI following,  -Multiple delays outpatient setting due to scheduling, poor compliance with bowel prep and insurance issues -1/18 colonoscopy reveals most likely cancerous mass see SBO  -Continue antibiotics per GI   Anemia, microcytic -Chronic questionably anemia of chronic disease and  malabsorption given above -1/18 anemia panel; appears to be mixed picture. - 1/20 will consider possible IV iron prior to chemotherapy.  Will await stabilization over next 48 hours post surgery, may consult oncology prior to administration. - Continue to monitor hemoglobin level closely  Lab Results  Component Value Date   HGB 8.2 (L) 02/13/2021   HGB 8.6 (L) 02/12/2021   HGB 9.0 (L) 02/11/2021   HGB 10.6 (L) 02/10/2021   HGB 10.8 (L) 02/09/2021  -Transfuse for hemoglobin<7  Hypokalemia - Potassium goal> 4   Hypomagnesmia - Magnesium goal> 2  Hypophosphatemia - Phosphorus goal> 2.5 - Sodium phosphate IV 15 mmol   Goals of care - 1/20 Palliative Care Consult: Young patient with new onset colon cancer, discuss goals of care,HCPOA.     DVT prophylaxis: Lovenox per pharmacy Code Status: Full Family Communication: 1/22 aunt and several other family members present at bedside for discussion of plan of care all questions answered Status is: Inpatient    Dispo: The patient is from: Home              Anticipated d/c is to:                 Anticipated d/c date is: > 3 days              Patient currently is not medically stable to d/c.      Consultants:  [facility] General surgery Dr.Praveena Iruku Oncology    Procedures/Significant Events:  1/18 Colonoscopy: COLON MASS, SIGMOID, BIOPSY:  - Adenomatous lesion with extensive high-grade dysplasia, see comment   B. 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 -Mildly dilated loops of small bowel within the upper abdomen suggestive of a mild postoperative adynamic ileus.   I have  personally reviewed and interpreted all radiology studies and my findings are as above.  VENTILATOR SETTINGS:    Cultures 1/18 colonic biopsy, pathology pending  Antimicrobials: Anti-infectives (From admission, onward)    Start     Ordered Stop   02/10/21 0600  cefoTEtan (CEFOTAN) 2 g in sodium chloride 0.9 % 100 mL IVPB        02/09/21 1431 02/11/21 0559         Devices    LINES / TUBES:      Continuous Infusions:  lactated ringers 10 mL/hr at 02/13/21 1008     Objective: Vitals:   02/12/21 0637 02/12/21 1315 02/12/21 2109 02/13/21 0555  BP: 101/75 106/77 107/73 99/78  Pulse: 95 92 82 84  Resp: 16 15 18 16   Temp: 98.3 F (36.8 C) 98.4 F (36.9 C) 98.5 F (36.9 C) 98.2 F (36.8 C)  TempSrc: Oral Oral Oral Oral  SpO2: 99% 100% 100% 99%  Weight:      Height:        Intake/Output Summary (Last 24 hours) at 02/13/2021 1122 Last data filed at 02/13/2021 1015 Gross per 24 hour  Intake 3870.08 ml  Output 174 ml  Net 3696.08 ml    Filed Weights   02/07/21 1142  Weight: 60.8 kg    Examination: General: A/O x4, No acute respiratory distress Eyes: negative scleral hemorrhage, negative anisocoria, negative icterus ENT: Negative Runny nose, negative gingival bleeding, Neck:  Negative scars, masses, torticollis, lymphadenopathy, JVD Lungs: Clear to auscultation bilaterally without wheezes or crackles Cardiovascular: Regular rate and rhythm without murmur gallop or rub normal S1 and S2 Abdomen: Positive abdominal pain epigastric with palpation, nondistended, negative bowel sounds, no rebound, no ascites, no appreciable mass Extremities: No significant cyanosis, clubbing, or edema bilateral lower extremities Skin: Negative rashes, lesions, ulcers Psychiatric:  Negative depression, negative anxiety, negative fatigue, negative mania  Central nervous system:  Cranial nerves II through XII intact, tongue/uvula midline, all extremities muscle strength 5/5, sensation  intact throughout, negative dysarthria, negative expressive aphasia, negative receptive aphasia.    Data Reviewed: Care during the described time interval was provided by me .  I have reviewed this patient's available data, including medical history, events of note, physical examination, and all test results as part of my evaluation.  CBC: Recent Labs  Lab 02/09/21 0318 02/10/21 0310 02/11/21 0303 02/12/21 0302 02/13/21 0257  WBC 9.7 7.9 9.4 10.6* 8.5  NEUTROABS  --  6.6 7.2 8.0* 6.1  HGB 10.8* 10.6* 9.0* 8.6* 8.2*  HCT 36.4 34.9* 29.7* 28.8* 27.6*  MCV 80.7 79.3* 79.2* 80.2 79.5*  PLT 335 375 331 356 841    Basic Metabolic Panel: Recent Labs  Lab 02/10/21 0310 02/10/21 1805 02/11/21 0303 02/12/21 0302 02/13/21 0257  NA 139 140 138 138 137  K 2.8* 3.5 3.6 4.1 3.6  CL 105 106 105 105 105  CO2 23 24 26 27 25   GLUCOSE 129* 151* 117* 91 113*  BUN 9 10 10 10 8   CREATININE 0.60 0.62 0.66 0.68 0.67  CALCIUM 9.2 8.5* 8.4* 8.2* 8.1*  MG 2.2  --  1.8 2.4  2.0  PHOS 3.9  --  3.1 2.1* 4.0    GFR: Estimated Creatinine Clearance: 84 mL/min (by C-G formula based on SCr of 0.67 mg/dL). Liver Function Tests: Recent Labs  Lab 02/08/21 0409 02/09/21 0318 02/10/21 0310 02/12/21 0302 02/13/21 0257  AST 10* 12* 11* 14* 16  ALT 8 8 9 8 9   ALKPHOS 49 54 57 42 42  BILITOT 0.6 0.9 0.4 0.5 0.5  PROT 6.5 7.8 7.4 5.7* 5.4*  ALBUMIN 2.9* 3.4* 3.2* 2.7* 2.5*    Recent Labs  Lab 02/07/21 1202  LIPASE 24    No results for input(s): AMMONIA in the last 168 hours. Coagulation Profile: No results for input(s): INR, PROTIME in the last 168 hours. Cardiac Enzymes: No results for input(s): CKTOTAL, CKMB, CKMBINDEX, TROPONINI in the last 168 hours. BNP (last 3 results) No results for input(s): PROBNP in the last 8760 hours. HbA1C: No results for input(s): HGBA1C in the last 72 hours. CBG: No results for input(s): GLUCAP in the last 168 hours. Lipid Profile: No results for  input(s): CHOL, HDL, LDLCALC, TRIG, CHOLHDL, LDLDIRECT in the last 72 hours. Thyroid Function Tests: No results for input(s): TSH, T4TOTAL, FREET4, T3FREE, THYROIDAB in the last 72 hours. Anemia Panel: No results for input(s): VITAMINB12, FOLATE, FERRITIN, TIBC, IRON, RETICCTPCT in the last 72 hours.  Sepsis Labs: No results for input(s): PROCALCITON, LATICACIDVEN in the last 168 hours.  Recent Results (from the past 240 hour(s))  Resp Panel by RT-PCR (Flu A&B, Covid) Nasopharyngeal Swab     Status: None   Collection Time: 02/08/21  8:50 AM   Specimen: Nasopharyngeal Swab; Nasopharyngeal(NP) swabs in vial transport medium  Result Value Ref Range Status   SARS Coronavirus 2 by RT PCR NEGATIVE NEGATIVE Final    Comment: (NOTE) SARS-CoV-2 target nucleic acids are NOT DETECTED.  The SARS-CoV-2 RNA is generally detectable in upper respiratory specimens during the acute phase of infection. The lowest concentration of SARS-CoV-2 viral copies this assay can detect is 138 copies/mL. A negative result does not preclude SARS-Cov-2 infection and should not be used as the sole basis for treatment or other patient management decisions. A negative result may occur with  improper specimen collection/handling, submission of specimen other than nasopharyngeal swab, presence of viral mutation(s) within the areas targeted by this assay, and inadequate number of viral copies(<138 copies/mL). A negative result must be combined with clinical observations, patient history, and epidemiological information. The expected result is Negative.  Fact Sheet for Patients:  EntrepreneurPulse.com.au  Fact Sheet for Healthcare Providers:  IncredibleEmployment.be  This test is no t yet approved or cleared by the [place] FDA and  has been authorized for detection and/or diagnosis of SARS-CoV-2 by FDA under an Emergency Use Authorization (EUA). This EUA will remain  in  effect (meaning this test can be used) for the duration of the COVID-19 declaration under Section 564(b)(1) of the Act, 21 U.S.C.section 360bbb-3(b)(1), unless the authorization is terminated  or revoked sooner.       Influenza A by PCR NEGATIVE NEGATIVE Final   Influenza B by PCR NEGATIVE NEGATIVE Final    Comment: (NOTE) The Xpert Xpress SARS-CoV-2/FLU/RSV plus assay is intended as an aid in the diagnosis of influenza from Nasopharyngeal swab specimens and should not be used as a sole basis for treatment. Nasal washings and aspirates are unacceptable for Xpert Xpress SARS-CoV-2/FLU/RSV testing.  Fact Sheet for Patients: EntrepreneurPulse.com.au  Fact Sheet for Healthcare Providers: IncredibleEmployment.be  This test is not yet approved  or cleared by the [place] and has been authorized for detection and/or diagnosis of SARS-CoV-2 by FDA under an Emergency Use Authorization (EUA). This EUA will remain in effect (meaning this test can be used) for the duration of the COVID-19 declaration under Section 564(b)(1) of the Act, 21 U.S.C. section 360bbb-3(b)(1), unless the authorization is terminated or revoked.  Performed at [facility], [place] [address] [place], [place] [address]   Surgical PCR screen     Status: None   Collection Time: 02/09/21 11:00 PM   Specimen: Nasal Mucosa; Nasal Swab  Result Value Ref Range Status   MRSA, PCR NEGATIVE NEGATIVE Final   Staphylococcus aureus NEGATIVE NEGATIVE Final    Comment: (NOTE) The Xpert SA Assay (FDA approved for NASAL specimens in patients 37 years of age and older), is one component of a comprehensive surveillance program. It is not intended to diagnose infection nor to guide or monitor treatment. Performed at [facility], [place] [address] [place],  [address]           Radiology Studies: CT CHEST W CONTRAST  Result Date:  02/11/2021 CLINICAL DATA:  Perforated colonic malignancy, initial staging examination, CNS neoplasm EXAM: CT CHEST WITH CONTRAST TECHNIQUE: Multidetector CT imaging of the chest was performed during intravenous contrast administration. RADIATION DOSE REDUCTION: This exam was performed according to the departmental dose-optimization program which includes automated exposure control, adjustment of the mA and/or kV according to patient size and/or use of iterative reconstruction technique. CONTRAST:  38m OMNIPAQUE IOHEXOL 300 MG/ML  SOLN COMPARISON:  None. FINDINGS: Cardiovascular: No significant vascular findings. Normal heart size. No pericardial effusion. Mediastinum/Nodes: No enlarged mediastinal, hilar, or axillary lymph nodes. Thyroid gland, trachea, and esophagus demonstrate no significant findings. Lungs/Pleura: Bibasilar atelectasis. No focal pulmonary nodules or infiltrates. No pneumothorax or pleural effusion. Central airways are widely patent. Upper Abdomen: Hypoattenuating lesion within the right hepatic dome compatible with a benign cavernous hemangioma, better characterized on prior CT examination of the abdomen pelvis of 02/07/2021. Multiple mildly dilated gas-filled loops of small bowel are seen within the left upper quadrant suggestive of a postoperative adynamic ileus, not well assessed on this examination. The stomach is not distended. No acute abnormality. Musculoskeletal: No acute bone abnormality. No lytic or blastic bone lesions are identified. IMPRESSION: No evidence of intrathoracic metastatic disease. Mildly dilated loops of small bowel within the upper abdomen suggestive of a mild postoperative adynamic ileus. Electronically Signed   By: AFidela SalisburyM.D.   On: 02/11/2021 20:49        Scheduled Meds:  acetaminophen  1,000 mg Oral Q6H   enoxaparin (LOVENOX) injection  40 mg Subcutaneous Q24H   ketorolac  30 mg Intravenous Q8H   methocarbamol  750 mg Oral TID   multivitamin with  minerals  1 tablet Oral Daily   mupirocin ointment  1 application Nasal BID   pantoprazole (PROTONIX) IV  40 mg Intravenous Q12H   sodium chloride flush  3 mL Intravenous Q12H   Continuous Infusions:  lactated ringers 10 mL/hr at 02/13/21 1008     LOS: 5 days    Time spent:40 min    Sameul Tagle, CGeraldo Docker MD [facility]   If 7PM-7AM, please contact night-coverage 02/13/2021, 11:22 AM

## 2021-02-13 NOTE — Progress Notes (Signed)
3 Days Post-Op   Subjective/Chief Complaint: No complaints. Tolerated fulls    Objective: Vital signs in last 24 hours: Temp:  [98.2 F (36.8 C)-98.5 F (36.9 C)] 98.2 F (36.8 C) (01/22 0555) Pulse Rate:  [82-92] 84 (01/22 0555) Resp:  [15-18] 16 (01/22 0555) BP: (99-107)/(73-78) 99/78 (01/22 0555) SpO2:  [99 %-100 %] 99 % (01/22 0555) Last BM Date: 02/13/21  Intake/Output from previous day: 01/21 0701 - 01/22 0700 In: 3736 [P.O.:1680; I.V.:1801; IV Piggyback:255] Out: 359 [Drains:269; Stool:90] Intake/Output this shift: No intake/output data recorded.  General appearance: alert and cooperative Resp: clear to auscultation bilaterally Cardio: regular rate and rhythm GI: soft, mild tenderness. Incsion ok. Ostomy pink and productive  Lab Results:  Recent Labs    02/12/21 0302 02/13/21 0257  WBC 10.6* 8.5  HGB 8.6* 8.2*  HCT 28.8* 27.6*  PLT 356 372   BMET Recent Labs    02/12/21 0302 02/13/21 0257  NA 138 137  K 4.1 3.6  CL 105 105  CO2 27 25  GLUCOSE 91 113*  BUN 10 8  CREATININE 0.68 0.67  CALCIUM 8.2* 8.1*   PT/INR No results for input(s): LABPROT, INR in the last 72 hours. ABG No results for input(s): PHART, HCO3 in the last 72 hours.  Invalid input(s): PCO2, PO2  Studies/Results: CT CHEST W CONTRAST  Result Date: 02/11/2021 CLINICAL DATA:  Perforated colonic malignancy, initial staging examination, CNS neoplasm EXAM: CT CHEST WITH CONTRAST TECHNIQUE: Multidetector CT imaging of the chest was performed during intravenous contrast administration. RADIATION DOSE REDUCTION: This exam was performed according to the departmental dose-optimization program which includes automated exposure control, adjustment of the mA and/or kV according to patient size and/or use of iterative reconstruction technique. CONTRAST:  22m OMNIPAQUE IOHEXOL 300 MG/ML  SOLN COMPARISON:  None. FINDINGS: Cardiovascular: No significant vascular findings. Normal heart size. No  pericardial effusion. Mediastinum/Nodes: No enlarged mediastinal, hilar, or axillary lymph nodes. Thyroid gland, trachea, and esophagus demonstrate no significant findings. Lungs/Pleura: Bibasilar atelectasis. No focal pulmonary nodules or infiltrates. No pneumothorax or pleural effusion. Central airways are widely patent. Upper Abdomen: Hypoattenuating lesion within the right hepatic dome compatible with a benign cavernous hemangioma, better characterized on prior CT examination of the abdomen pelvis of 02/07/2021. Multiple mildly dilated gas-filled loops of small bowel are seen within the left upper quadrant suggestive of a postoperative adynamic ileus, not well assessed on this examination. The stomach is not distended. No acute abnormality. Musculoskeletal: No acute bone abnormality. No lytic or blastic bone lesions are identified. IMPRESSION: No evidence of intrathoracic metastatic disease. Mildly dilated loops of small bowel within the upper abdomen suggestive of a mild postoperative adynamic ileus. Electronically Signed   By: AFidela SalisburyM.D.   On: 02/11/2021 20:49    Anti-infectives: Anti-infectives (From admission, onward)    Start     Dose/Rate Route Frequency Ordered Stop   02/10/21 0600  cefoTEtan (CEFOTAN) 2 g in sodium chloride 0.9 % 100 mL IVPB        2 g 200 mL/hr over 30 Minutes Intravenous On call to O.R. 02/09/21 1431 02/10/21 1104       Assessment/Plan: s/p Procedure(s): ROBOTIC ASSISTED PARTIAL COLECTOMY WITH COLOSTOMY (N/A) Advance diet. Allow soft today Sigmoid colon mass, likely malignant - colonoscopy 02/09/21: Likely malignant partially obstructing tumor in the distal sigmoid colon (Biopsied, tattooed) and Congested, inflamed and thickened folds of the mucosa in the recto-sigmoid colon (Biopsied. Tattooed). Path w/ adenomatous lesion with high grade dysplasia. Rectal bx colitis.  s/p Laparoscopy and open sigmoid colectomy with end pouch [name] procedure) 02/10/21  Dr. Hassell Done - CEA 16.8  - follow surgical path  - having gas and stool via colostomy, allow fulls - D/C Foley - Will also need CT chest to evaluate for any metastatic disease - mobilize    FEN - CLD, prealbumin is 9.0, encourage protein shakes VTE - SCDs, okay for chemical propylaxis from a general surgery standpoint ID - none Foley- removed  LOS: 5 days    Autumn Messing III 02/13/2021

## 2021-02-14 ENCOUNTER — Telehealth: Payer: Self-pay | Admitting: Hematology

## 2021-02-14 ENCOUNTER — Other Ambulatory Visit: Payer: Self-pay

## 2021-02-14 LAB — CBC WITH DIFFERENTIAL/PLATELET
Abs Immature Granulocytes: 0.1 10*3/uL — ABNORMAL HIGH (ref 0.00–0.07)
Basophils Absolute: 0 10*3/uL (ref 0.0–0.1)
Basophils Relative: 0 %
Eosinophils Absolute: 0.1 10*3/uL (ref 0.0–0.5)
Eosinophils Relative: 1 %
HCT: 28.5 % — ABNORMAL LOW (ref 36.0–46.0)
Hemoglobin: 8.5 g/dL — ABNORMAL LOW (ref 12.0–15.0)
Immature Granulocytes: 1 %
Lymphocytes Relative: 14 %
Lymphs Abs: 1.1 10*3/uL (ref 0.7–4.0)
MCH: 23.6 pg — ABNORMAL LOW (ref 26.0–34.0)
MCHC: 29.8 g/dL — ABNORMAL LOW (ref 30.0–36.0)
MCV: 79.2 fL — ABNORMAL LOW (ref 80.0–100.0)
Monocytes Absolute: 0.5 10*3/uL (ref 0.1–1.0)
Monocytes Relative: 6 %
Neutro Abs: 6.1 10*3/uL (ref 1.7–7.7)
Neutrophils Relative %: 78 %
Platelets: 436 10*3/uL — ABNORMAL HIGH (ref 150–400)
RBC: 3.6 MIL/uL — ABNORMAL LOW (ref 3.87–5.11)
RDW: 17.9 % — ABNORMAL HIGH (ref 11.5–15.5)
WBC: 7.9 10*3/uL (ref 4.0–10.5)
nRBC: 0 % (ref 0.0–0.2)

## 2021-02-14 LAB — COMPREHENSIVE METABOLIC PANEL
ALT: 11 U/L (ref 0–44)
AST: 19 U/L (ref 15–41)
Albumin: 2.5 g/dL — ABNORMAL LOW (ref 3.5–5.0)
Alkaline Phosphatase: 56 U/L (ref 38–126)
Anion gap: 7 (ref 5–15)
BUN: 6 mg/dL (ref 6–20)
CO2: 26 mmol/L (ref 22–32)
Calcium: 8 mg/dL — ABNORMAL LOW (ref 8.9–10.3)
Chloride: 105 mmol/L (ref 98–111)
Creatinine, Ser: 0.61 mg/dL (ref 0.44–1.00)
GFR, Estimated: >60 mL/min (ref >60)
Glucose, Bld: 114 mg/dL — ABNORMAL HIGH (ref 70–99)
Potassium: 3.5 mmol/L (ref 3.5–5.1)
Sodium: 138 mmol/L (ref 135–145)
Total Bilirubin: 0.6 mg/dL (ref 0.3–1.2)
Total Protein: 5.6 g/dL — ABNORMAL LOW (ref 6.5–8.1)

## 2021-02-14 LAB — PHOSPHORUS: Phosphorus: 3.8 mg/dL (ref 2.5–4.6)

## 2021-02-14 LAB — MAGNESIUM: Magnesium: 2 mg/dL (ref 1.7–2.4)

## 2021-02-14 MED ORDER — ZOLPIDEM TARTRATE 5 MG PO TABS: 5.0000 mg | ORAL_TABLET | Freq: Every evening | ORAL | Status: DC | PRN

## 2021-02-14 NOTE — Plan of Care (Signed)
Problem: Education: Goal: Knowledge of General Education information will improve Description Including pain rating scale, medication(s)/side effects and non-pharmacologic comfort measures Outcome: Progressing   Problem: Health Behavior/Discharge Planning: Goal: Ability to manage health-related needs will improve Outcome: Progressing   Problem: Elimination: Goal: Will not experience complications related to bowel motility Outcome: Progressing Goal: Will not experience complications related to urinary retention Outcome: Progressing   Problem: Pain Managment: Goal: General experience of comfort will improve Outcome: Progressing   

## 2021-02-14 NOTE — Consult Note (Signed)
[place] Nurse ostomy follow up Stoma type/location: LLQ, colostomy Stomal assessment/size:  1 1/2" round, budded  Peristomal assessment: NA Treatment options for stomal/peristomal skin: using 2" skin barrier ring Output liquid brown Ostomy pouching: 2pc. 2 1/4" pouching system with 2" skin barrier Education provided:  Met with patient and her daughter Patient changed pouch this am with minimal assistance.  Discussed showering, diet, meds, consitpation, bowel habits Discussed clothing options; daughter looking up osteosecrets for patient Seems to have a lot of support.  Education on burping gas from the pouch; gas producing foods.  Provided patient with [facility] catelog and extra supplies for DC Enrolled patient in [place] Start Discharge program: Yes  [place] Nurse will follow along with you for continued support with ostomy teaching and care [place] MSN, [place], [place], [place], [place]

## 2021-02-14 NOTE — Progress Notes (Signed)
PROGRESS NOTE    Barbara Valdez  YPP:[id number] DOB: [birth year] DOA: 02/07/2021 PCP: Pcp, No     Brief Narrative:  46 y.o. BF PMHx fibroids, iron deficiency anemia, colitis, anxiety   Presents with recurrent abdominal pain.  Recent admission November 2022 discharged on antibiotics but did not complete the course due to side effects of nausea, has been attempting to have outpatient colonoscopy for the past 3 months with multiple delays in the setting of insurance and scheduling.    Presents to the ED on 02/07/2021 with profoundly worsening abdominal pain, imaging concerning for colitis.   Subjective: 1/23 afebrile overnight A/O x4, does not feel comfortable with discharge today.  Has concern some fluids overnight.  Negative nausea, negative vomiting.   Assessment & Plan: Covid vaccination;   Principal Problem:   Colitis Active Problems:   Iron deficiency anemia   Colon cancer ([place])  SBO with rectosigmoid junction abscess/COLON cancer - Discussed case with Dr. Alessandra Bevels GI who performed colonoscopy, and feels that the mass like lesion in the distal sigmoid colon most likely cancerous.  His team is already contacted general surgery. -1/18 surgery recommends; partial colectomy/ colostomy this admission.  Have scheduled for 1/19 -1/18 biopsy from sigmoid colon C/W Adenomatous lesion with extensive high-grade dysplasia.  See results below -1/19 s/p colectomy: Invasive cancer.  See results below -1/20 CT chest W contrast: Negative metastatic disease see results below  -Discussed case with Dr.Praveena Iruku Oncology who will see patient on Monday if final pathology has resulted to discuss plan of care.  Otherwise one of her colleagues will see on Tuesday. -1/23 spoke with [facility] pathology and appears they have not ordered any stains on biopsy, she is going to investigate into case and contact me. -1/23 again discussed case with Dr.Praveena Iruku Oncology and explained that there had been  a mixup with the pathology and it had just been sent out today, her recommendation was to discharge her and have her follow-up as outpatient with Dr. Benay Spice Dr. Burr Medico -1/23 discussed case with PA Saverio Danker surgery although patient has minimal output from LP drain she recommends that we hold onto the patient at least another 24 hours.  In addition patient told her that she did have nausea and vomiting overnight.  We will hold onto patient at least another 24 hours. -Establish care with Dr. 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 - 1/20 will consider possible IV iron prior to chemotherapy.  Will await stabilization over next 48 hours post surgery, may consult oncology prior to administration. - Continue to monitor hemoglobin level closely  Lab Results  Component Value Date   HGB 8.5 (L) 02/14/2021   HGB 8.2 (L) 02/13/2021   HGB 8.6 (L) 02/12/2021   HGB 9.0 (L) 02/11/2021   HGB 10.6 (L) 02/10/2021  -Transfuse for hemoglobin<7  Hypokalemia - Potassium goal> 4   Hypomagnesmia - Magnesium goal> 2  Hypophosphatemia - Phosphorus goal> 2.5 - Sodium phosphate IV 15 mmol  Hypocalcemia  - 1/23 corrected calcium= 9.2 no calcium supplementation needed at this time.   Goals of care - 1/20 Palliative  Care Consult: Young patient with new onset colon cancer, discuss goals of care,HCPOA.     DVT prophylaxis: Lovenox per pharmacy Code Status: Full Family Communication: 1/23 aunt and several other family members present at bedside for  discussion of plan of care all questions answered Status is: Inpatient    Dispo: The patient is from: Home              Anticipated d/c is to:                 Anticipated d/c date is: > 3 days              Patient currently is not medically stable to d/c.      Consultants:  [facility] General surgery Dr.Praveena Iruku Oncology    Procedures/Significant Events:  1/18 Colonoscopy: COLON MASS, SIGMOID, BIOPSY:  - Adenomatous lesion with extensive high-grade dysplasia, see comment   B. 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 -Mildly dilated loops of small bowel within the upper abdomen suggestive of a mild postoperative adynamic ileus.   I have personally reviewed and interpreted all radiology studies and my findings are as above.  VENTILATOR SETTINGS:    Cultures 1/18 colonic biopsy, pathology pending   Antimicrobials: Anti-infectives (From admission, onward)    Start     Ordered Stop   02/10/21 0600  cefoTEtan (CEFOTAN) 2 g in sodium chloride 0.9 % 100 mL IVPB        02/09/21 1431 02/11/21 0559         Devices    LINES / TUBES:      Continuous Infusions:  lactated ringers 10 mL/hr at 02/13/21 1008     Objective: Vitals:   02/13/21 0555 02/13/21 1458 02/13/21 2112 02/14/21 0512  BP: 99/78 115/76 108/82 113/83  Pulse: 84 93 97 97  Resp: 16 14 16 16   Temp: 98.2 F (36.8 C) 98.9 F (37.2 C) 98.8 F (37.1 C) 98.7 F (37.1 C)  TempSrc: Oral Oral Oral Oral  SpO2: 99% 100% 100% 98%  Weight:      Height:        Intake/Output Summary (Last 24 hours) at 02/14/2021 7628 Last data filed at 02/14/2021 0600 Gross  per 24 hour  Intake 1469.44 ml  Output 195 ml  Net 1274.44 ml    Filed Weights   02/07/21 1142  Weight: 60.8 kg    Examination: General: A/O x4, No acute respiratory distress Eyes: negative scleral hemorrhage, negative anisocoria, negative icterus ENT: Negative Runny nose, negative gingival bleeding, Neck:  Negative scars, masses, torticollis, lymphadenopathy, JVD Lungs: Clear to auscultation bilaterally without wheezes or crackles Cardiovascular: Regular rate and rhythm without murmur gallop or rub normal S1 and S2 Abdomen: Positive abdominal pain epigastric with palpation, nondistended, negative bowel sounds, no rebound, no ascites, no appreciable mass Extremities: No significant cyanosis, clubbing, or edema bilateral lower extremities Skin: Negative rashes, lesions, ulcers Psychiatric:  Negative depression, negative anxiety, negative fatigue, negative mania  Central nervous system:  Cranial nerves II through XII intact, tongue/uvula midline, all extremities muscle strength 5/5, sensation intact throughout, negative dysarthria, negative expressive aphasia, negative receptive aphasia.    Data Reviewed: Care during the described time interval  was provided by me .  I have reviewed this patient's available data, including medical history, events of note, physical examination, and all test results as part of my evaluation.  CBC: Recent Labs  Lab 02/10/21 0310 02/11/21 0303 02/12/21 0302 02/13/21 0257 02/14/21 0314  WBC 7.9 9.4 10.6* 8.5 7.9  NEUTROABS 6.6 7.2 8.0* 6.1 6.1  HGB 10.6* 9.0* 8.6* 8.2* 8.5*  HCT 34.9* 29.7* 28.8* 27.6* 28.5*  MCV 79.3* 79.2* 80.2 79.5* 79.2*  PLT 375 331 356 372 436*    Basic Metabolic Panel: Recent Labs  Lab 02/10/21 0310 02/10/21 1805 02/11/21 0303 02/12/21 0302 02/13/21 0257 02/14/21 0314  NA 139 140 138 138 137 138  K 2.8* 3.5 3.6 4.1 3.6 3.5  CL 105 106 105 105 105 105  CO2 23 24 26 27 25 26   GLUCOSE 129* 151* 117* 91 113* 114*   BUN 9 10 10 10 8 6   CREATININE 0.60 0.62 0.66 0.68 0.67 0.61  CALCIUM 9.2 8.5* 8.4* 8.2* 8.1* 8.0*  MG 2.2  --  1.8 2.4 2.0 2.0  PHOS 3.9  --  3.1 2.1* 4.0 3.8    GFR: Estimated Creatinine Clearance: 84 mL/min (by C-G formula based on SCr of 0.61 mg/dL). Liver Function Tests: Recent Labs  Lab 02/09/21 0318 02/10/21 0310 02/12/21 0302 02/13/21 0257 02/14/21 0314  AST 12* 11* 14* 16 19  ALT 8 9 8 9 11   ALKPHOS 54 57 42 42 56  BILITOT 0.9 0.4 0.5 0.5 0.6  PROT 7.8 7.4 5.7* 5.4* 5.6*  ALBUMIN 3.4* 3.2* 2.7* 2.5* 2.5*    Recent Labs  Lab 02/07/21 1202  LIPASE 24    No results for input(s): AMMONIA in the last 168 hours. Coagulation Profile: No results for input(s): INR, PROTIME in the last 168 hours. Cardiac Enzymes: No results for input(s): CKTOTAL, CKMB, CKMBINDEX, TROPONINI in the last 168 hours. BNP (last 3 results) No results for input(s): PROBNP in the last 8760 hours. HbA1C: No results for input(s): HGBA1C in the last 72 hours. CBG: No results for input(s): GLUCAP in the last 168 hours. Lipid Profile: No results for input(s): CHOL, HDL, LDLCALC, TRIG, CHOLHDL, LDLDIRECT in the last 72 hours. Thyroid Function Tests: No results for input(s): TSH, T4TOTAL, FREET4, T3FREE, THYROIDAB in the last 72 hours. Anemia Panel: No results for input(s): VITAMINB12, FOLATE, FERRITIN, TIBC, IRON, RETICCTPCT in the last 72 hours.  Sepsis Labs: No results for input(s): PROCALCITON, LATICACIDVEN in the last 168 hours.  Recent Results (from the past 240 hour(s))  Resp Panel by RT-PCR (Flu A&B, Covid) Nasopharyngeal Swab     Status: None   Collection Time: 02/08/21  8:50 AM   Specimen: Nasopharyngeal Swab; Nasopharyngeal(NP) swabs in vial transport medium  Result Value Ref Range Status   SARS Coronavirus 2 by RT PCR NEGATIVE NEGATIVE Final    Comment: (NOTE) SARS-CoV-2 target nucleic acids are NOT DETECTED.  The SARS-CoV-2 RNA is generally detectable in upper  respiratory specimens during the acute phase of infection. The lowest concentration of SARS-CoV-2 viral copies this assay can detect is 138 copies/mL. A negative result does not preclude SARS-Cov-2 infection and should not be used as the sole basis for treatment or other patient management decisions. A negative result may occur with  improper specimen collection/handling, submission of specimen other than nasopharyngeal swab, presence of viral mutation(s) within the areas targeted by this assay, and inadequate number of viral copies(<138 copies/mL). A negative result must be combined with clinical observations, patient history,  and epidemiological information. The expected result is Negative.  Fact Sheet for Patients:  EntrepreneurPulse.com.au  Fact Sheet for Healthcare Providers:  IncredibleEmployment.be  This test is no t yet approved or cleared by the [place] FDA and  has been authorized for detection and/or diagnosis of SARS-CoV-2 by FDA under an Emergency Use Authorization (EUA). This EUA will remain  in effect (meaning this test can be used) for the duration of the COVID-19 declaration under Section 564(b)(1) of the Act, 21 U.S.C.section 360bbb-3(b)(1), unless the authorization is terminated  or revoked sooner.       Influenza A by PCR NEGATIVE NEGATIVE Final   Influenza B by PCR NEGATIVE NEGATIVE Final    Comment: (NOTE) The Xpert Xpress SARS-CoV-2/FLU/RSV plus assay is intended as an aid in the diagnosis of influenza from Nasopharyngeal swab specimens and should not be used as a sole basis for treatment. Nasal washings and aspirates are unacceptable for Xpert Xpress SARS-CoV-2/FLU/RSV testing.  Fact Sheet for Patients: EntrepreneurPulse.com.au  Fact Sheet for Healthcare Providers: IncredibleEmployment.be  This test is not yet approved or cleared by the [place] FDA and has been  authorized for detection and/or diagnosis of SARS-CoV-2 by FDA under an Emergency Use Authorization (EUA). This EUA will remain in effect (meaning this test can be used) for the duration of the COVID-19 declaration under Section 564(b)(1) of the Act, 21 U.S.C. section 360bbb-3(b)(1), unless the authorization is terminated or revoked.  Performed at [facility], [place] [address], [place], [place] [address]   Surgical PCR screen     Status: None   Collection Time: 02/09/21 11:00 PM   Specimen: Nasal Mucosa; Nasal Swab  Result Value Ref Range Status   MRSA, PCR NEGATIVE NEGATIVE Final   Staphylococcus aureus NEGATIVE NEGATIVE Final    Comment: (NOTE) The Xpert SA Assay (FDA approved for NASAL specimens in patients 2 years of age and older), is one component of a comprehensive surveillance program. It is not intended to diagnose infection nor to guide or monitor treatment. Performed at [facility], [place] [address], [place], [place] [address]           Radiology Studies: No results found.      Scheduled Meds:  acetaminophen  1,000 mg Oral Q6H   enoxaparin (LOVENOX) injection  40 mg Subcutaneous Q24H   methocarbamol  750 mg Oral TID   multivitamin with minerals  1 tablet Oral Daily   mupirocin ointment  1 application Nasal BID   pantoprazole (PROTONIX) IV  40 mg Intravenous Q12H   sodium chloride flush  3 mL Intravenous Q12H   Continuous Infusions:  lactated ringers 10 mL/hr at 02/13/21 1008     LOS: 6 days    Time spent:40 min    Britiney Blahnik, Geraldo Docker, MD [facility]   If 7PM-7AM, please contact night-coverage 02/14/2021, 9:22 AM

## 2021-02-14 NOTE — Progress Notes (Signed)
4 Days Post-Op   Subjective/Chief Complaint: Had 2 episodes of emesis overnight, but has drank her boost this morning and has tolerated it thus far.   Objective: Vital signs in last 24 hours: Temp:  [98.7 F (37.1 C)-98.9 F (37.2 C)] 98.7 F (37.1 C) (01/23 0512) Pulse Rate:  [93-97] 97 (01/23 0512) Resp:  [14-16] 16 (01/23 0512) BP: (108-115)/(76-83) 113/83 (01/23 0512) SpO2:  [98 %-100 %] 98 % (01/23 0512) Last BM Date: 02/13/21  Intake/Output from previous day: 01/22 0701 - 01/23 0700 In: 1469.4 [P.O.:960; I.V.:509.4] Out: 195 [Emesis/NG output:100; Drains:95] Intake/Output this shift: No intake/output data recorded.  PE: Abd: soft, mild distention, ostomy just changed with minimal output currently, stoma is pink and viable.  Midline incision with staples present and honeycomb dressing.  JP drain with minimal serous output  Lab Results:  Recent Labs    02/13/21 0257 02/14/21 0314  WBC 8.5 7.9  HGB 8.2* 8.5*  HCT 27.6* 28.5*  PLT 372 436*   BMET Recent Labs    02/13/21 0257 02/14/21 0314  NA 137 138  K 3.6 3.5  CL 105 105  CO2 25 26  GLUCOSE 113* 114*  BUN 8 6  CREATININE 0.67 0.61  CALCIUM 8.1* 8.0*   PT/INR No results for input(s): LABPROT, INR in the last 72 hours. ABG No results for input(s): PHART, HCO3 in the last 72 hours.  Invalid input(s): PCO2, PO2  Studies/Results: No results found.  Anti-infectives: Anti-infectives (From admission, onward)    Start     Dose/Rate Route Frequency Ordered Stop   02/10/21 0600  cefoTEtan (CEFOTAN) 2 g in sodium chloride 0.9 % 100 mL IVPB        2 g 200 mL/hr over 30 Minutes Intravenous On call to O.R. 02/09/21 1431 02/10/21 1104       Assessment/Plan: POD 4, s/p Laparoscopy and open sigmoid colectomy with end pouch Barbara Valdez procedure) 02/10/21 Dr. Hassell Done for sigmoid colon mass - Valdez 16.8  - follow surgical path, not back yet - having gas and stool via colostomy, on soft diet, but had emesis  overnight.  Cont soft diet and take it slow and see how she does.  If she tolerates with no further emesis, then may be able to go home tomorrow. -drinking Boost protein shakes - CT chest negative for mets - mobilize    FEN - soft, boost VTE - SCDs, Lovenox ID - none Foley- removed  LOS: 6 days    Barbara Valdez 02/14/2021

## 2021-02-14 NOTE — Telephone Encounter (Signed)
Dr. Clydene Laming called to schedule this pt a new pt appt with Dr. Burr Medico. He requested next available. I scheduled appt and he told me it would be communicated with pt. Msg sent to nurse navigator with update.

## 2021-02-14 NOTE — Progress Notes (Signed)
Ostomy 2-piece changed this AM. Patient completed 95% of this change with moderate coaching from RN. Will continue to offer education.   [place], Therapist, sports

## 2021-02-15 LAB — COMPREHENSIVE METABOLIC PANEL
ALT: 14 U/L (ref 0–44)
AST: 24 U/L (ref 15–41)
Albumin: 2.5 g/dL — ABNORMAL LOW (ref 3.5–5.0)
Alkaline Phosphatase: 55 U/L (ref 38–126)
Anion gap: 6 (ref 5–15)
BUN: 6 mg/dL (ref 6–20)
CO2: 29 mmol/L (ref 22–32)
Calcium: 8.2 mg/dL — ABNORMAL LOW (ref 8.9–10.3)
Chloride: 104 mmol/L (ref 98–111)
Creatinine, Ser: 0.51 mg/dL (ref 0.44–1.00)
GFR, Estimated: >60 mL/min (ref >60)
Glucose, Bld: 99 mg/dL (ref 70–99)
Potassium: 3.4 mmol/L — ABNORMAL LOW (ref 3.5–5.1)
Sodium: 139 mmol/L (ref 135–145)
Total Bilirubin: 0.5 mg/dL (ref 0.3–1.2)
Total Protein: 5.5 g/dL — ABNORMAL LOW (ref 6.5–8.1)

## 2021-02-15 LAB — CBC WITH DIFFERENTIAL/PLATELET
Abs Immature Granulocytes: 0.05 10*3/uL (ref 0.00–0.07)
Basophils Absolute: 0 10*3/uL (ref 0.0–0.1)
Basophils Relative: 0 %
Eosinophils Absolute: 0.2 10*3/uL (ref 0.0–0.5)
Eosinophils Relative: 3 %
HCT: 25.3 % — ABNORMAL LOW (ref 36.0–46.0)
Hemoglobin: 7.5 g/dL — ABNORMAL LOW (ref 12.0–15.0)
Immature Granulocytes: 1 %
Lymphocytes Relative: 30 %
Lymphs Abs: 1.7 10*3/uL (ref 0.7–4.0)
MCH: 23.6 pg — ABNORMAL LOW (ref 26.0–34.0)
MCHC: 29.6 g/dL — ABNORMAL LOW (ref 30.0–36.0)
MCV: 79.6 fL — ABNORMAL LOW (ref 80.0–100.0)
Monocytes Absolute: 0.5 10*3/uL (ref 0.1–1.0)
Monocytes Relative: 9 %
Neutro Abs: 3.2 10*3/uL (ref 1.7–7.7)
Neutrophils Relative %: 57 %
Platelets: 382 10*3/uL (ref 150–400)
RBC: 3.18 MIL/uL — ABNORMAL LOW (ref 3.87–5.11)
RDW: 17.7 % — ABNORMAL HIGH (ref 11.5–15.5)
WBC: 5.6 10*3/uL (ref 4.0–10.5)
nRBC: 0 % (ref 0.0–0.2)

## 2021-02-15 LAB — MAGNESIUM: Magnesium: 2.2 mg/dL (ref 1.7–2.4)

## 2021-02-15 LAB — CBC
HCT: 26.5 % — ABNORMAL LOW (ref 36.0–46.0)
Hemoglobin: 8 g/dL — ABNORMAL LOW (ref 12.0–15.0)
MCH: 24.1 pg — ABNORMAL LOW (ref 26.0–34.0)
MCHC: 30.2 g/dL (ref 30.0–36.0)
MCV: 79.8 fL — ABNORMAL LOW (ref 80.0–100.0)
Platelets: 434 10*3/uL — ABNORMAL HIGH (ref 150–400)
RBC: 3.32 MIL/uL — ABNORMAL LOW (ref 3.87–5.11)
RDW: 17.8 % — ABNORMAL HIGH (ref 11.5–15.5)
WBC: 6.3 10*3/uL (ref 4.0–10.5)
nRBC: 0 % (ref 0.0–0.2)

## 2021-02-15 LAB — PHOSPHORUS: Phosphorus: 3.5 mg/dL (ref 2.5–4.6)

## 2021-02-15 LAB — SURGICAL PATHOLOGY

## 2021-02-15 MED ORDER — OXYCODONE HCL 5 MG PO TABS: 5.0000 mg | ORAL_TABLET | Freq: Four times a day (QID) | ORAL | 0 refills | Status: DC | PRN

## 2021-02-15 MED ORDER — METHOCARBAMOL 750 MG PO TABS: 750.0000 mg | ORAL_TABLET | Freq: Three times a day (TID) | ORAL | 0 refills | Status: DC | PRN

## 2021-02-15 MED ORDER — SODIUM CHLORIDE 0.9 % IV SOLN
510.0000 mg | Freq: Once | INTRAVENOUS | Status: AC
  Administered 2021-02-15: 12:00:00 510 mg via INTRAVENOUS
  Filled 2021-02-15: qty 17

## 2021-02-15 MED ORDER — ACETAMINOPHEN 500 MG PO TABS: 1000.0000 mg | ORAL_TABLET | Freq: Four times a day (QID) | ORAL | 0 refills | Status: DC | PRN

## 2021-02-15 NOTE — Progress Notes (Signed)
°  Progress Note   Patient: Barbara Valdez FGH:[id number] DOB: 1975-10-15 DOA: 02/07/2021     7 DOS: the patient was seen and examined on 02/15/2021   Brief hospital course: Past medical history of iron deficiency anemia, recurrent colitis.  Presents with complaints of abdominal pain found to have colonic mass.  SP partial colectomy and colostomy creation.  Assessment and Plan Small bowel obstruction. 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 Patient has prior history of recurrent colitis.  Presently complains of abdominal pain and small bowel obstruction. CEA was elevated. GI was consulted.  Underwent colonoscopy. General surgery was consulted.  Underwent partial colectomy and colostomy creation on 1/19. Biopsy came back positive for adenocarcinoma. Hematology was consulted.  Patient will be following up with them outpatient in February. Drain removed on 1/24. Observe overnight per surgery.  Macrocytic iron deficiency anemia. Received Ferrlecit last admission with nausea and vomiting. Will treat with Feraheme. Outpatient follow-up with oncology.  Hypokalemia. Hypomagnesemia. Hypophosphatemia. Currently being treated.  Monitor.     Subjective: Pain improving.  No nausea no vomiting.  Tolerating oral diet.  Objective Vitals:   02/14/21 1409 02/14/21 2242 02/15/21 0530 02/15/21 1534  BP: 104/72 109/77 113/83 104/77  Pulse: 97 85 81 99  Resp: 14 18 18 18   Temp: 98.6 F (37 C) 98.6 F (37 C) 98.7 F (37.1 C) 98.5 F (36.9 C)  TempSrc: Oral Oral Oral   SpO2: 100% 100% 100% 97%  Weight:      Height:        General: Appear in mild distress, no Rash; Oral Mucosa Clear, moist. no Abnormal Neck Mass Or lumps, Conjunctiva normal  Cardiovascular: S1 and S2 Present, no Murmur, Respiratory: good respiratory effort, Bilateral Air entry present and CTA, no Crackles, no wheezes Abdomen: Bowel Sound present, Soft and no tenderness Extremities: no Pedal  edema Neurology: alert and oriented to time, place, and person affect appropriate. no new focal deficit Gait not checked due to patient safety concerns   Data Reviewed:  CBC shows signs of anemia.  Repeat CBC shows stable hemoglobin.  BMP shows no acute abnormality. However a repeat CBC, repeat BMP and magnesium level tomorrow.  Family Communication: Family on the phone at the time of my evaluation.  Disposition: Status is: Inpatient  Remains inpatient appropriate because: Monitoring for diet tolerance and functioning of colostomy.    Author: Berle Mull, MD 02/15/2021 7:23 PM  For on call review [email or web address].

## 2021-02-15 NOTE — Discharge Instructions (Addendum)
[facility], [place] [phone]  OPEN ABDOMINAL SURGERY: POST OP INSTRUCTIONS  Always review your discharge instruction sheet given to you by the facility where your surgery was performed.  IF YOU HAVE DISABILITY OR FAMILY LEAVE FORMS, YOU MUST BRING THEM TO THE OFFICE FOR PROCESSING.  PLEASE DO NOT GIVE THEM TO YOUR DOCTOR.  A prescription for pain medication may be given to you upon discharge.  Take your pain medication as prescribed, if needed.  If narcotic pain medicine is not needed, then you may take acetaminophen (Tylenol) or ibuprofen (Advil) as needed. Take your usually prescribed medications unless otherwise directed. If you need a refill on your pain medication, please contact your pharmacy. They will contact our office to request authorization.  Prescriptions will not be filled after 5pm or on week-ends. You should follow a light diet the first few days after arrival home, such as soup and crackers, pudding, etc.unless your doctor has advised otherwise. A high-fiber, low fat diet can be resumed as tolerated.   Be sure to include lots of fluids daily. Most patients will experience some swelling and bruising on the chest and neck area.  Ice packs will help.  Swelling and bruising can take several days to resolve Most patients will experience some swelling and bruising in the area of the incision. Ice pack will help. Swelling and bruising can take several days to resolve..  It is common to experience some constipation if taking pain medication after surgery.  Increasing fluid intake and taking a stool softener will usually help or prevent this problem from occurring.  A mild laxative (Milk of Magnesia or Miralax) should be taken according to package directions if there are no bowel movements after 48 hours.  You may have steri-strips (small skin tapes) in place directly over the incision.  These strips should be left on the skin for 7-10 days.  If your surgeon used skin  glue on the incision, you may shower in 24 hours.  The glue will flake off over the next 2-3 weeks.  Any sutures or staples will be removed at the office during your follow-up visit. You may find that a light gauze bandage over your incision may keep your staples from being rubbed or pulled. You may shower and replace the bandage daily. ACTIVITIES:  You may resume regular (light) daily activities beginning the next day--such as daily self-care, walking, climbing stairs--gradually increasing activities as tolerated.  You may have sexual intercourse when it is comfortable.  Refrain from any heavy lifting or straining until approved by your doctor. You may drive when you no longer are taking prescription pain medication, you can comfortably wear a seatbelt, and you can safely maneuver your car and apply brakes Return to Work: ___________________________________ Barbara Valdez should see your doctor in the office for a follow-up appointment approximately two weeks after your surgery.  Make sure that you call for this appointment within a day or two after you arrive home to insure a convenient appointment time. OTHER INSTRUCTIONS:  _____________________________________________________________ _____________________________________________________________  WHEN TO CALL YOUR DOCTOR: Fever over 101.0 Inability to urinate Nausea and/or vomiting Extreme swelling or bruising Continued bleeding from incision. Increased pain, redness, or drainage from the incision. Difficulty swallowing or breathing Muscle cramping or spasms. Numbness or tingling in hands or feet or around lips.  The clinic staff is available to answer your questions during regular business hours.  Please dont hesitate to call and ask to speak to one of  the nurses if you have concerns.  For further questions, please visit [email or web address]      Low Fiber Nutrition Therapy   You may need a low-fiber diet if you have Crohn's  disease, diverticulitis, gastroparesis, ulcerative colitis, a new colostomy, or new ileostomy. A low-fiber diet may also be needed following radiation therapy to the pelvis and lower bowel or recent intestinal surgery.  A low-fiber diet reduces the frequency and volume of your stools. This lessens irritation to the gastrointestinal (GI) tract and can help you heal. Use this diet if you have a stricture so your intestine doesn't get blocked. The goal of this diet is to get less than 8 grams of fiber daily. It's also important to eat enough protein foods while you are on a low-fiber diet.  Drink nutrition supplements that have 1 gram of fiber or less in each serving. If your stricture is severe or if your inflammation is severe, drink more liquids to reduce symptoms and to get enough calories and protein.  Tips Eat about 5 to 6 small meals daily or about every 3 to 4 hours. Do not skip meals.  Every time you eat, include a small amount of protein (1 to 2 ounces) plus an additional food. Low fiber starch foods are the best choice to eat with protein.  Limit acidic, spicy and high-fat or fried and greasy foods to reduce GI symptoms.  Do not eat raw fruits and vegetables while on this diet. All fruits and vegetables need to be cooked and without peels or skins.  Drink a lot of fluids, at least 8 cups of fluid each day. Limit drinks with caffeine, sugar, and sugar substitutes.  Plain water is the best choice. Avoid mixing drink packets or flavor drops into water. .  Take a chewable multivitamin with minerals. Gummy vitamins do not have enough minerals and can block an ostomy and non-chewable supplements are not easily digested. Chewable supplements must be used if you have a stricture or ostomy.  If you are lactose intolerant, you may need to eat low-lactose dairy products. If you can't tolerate dairy, ask your RDN about how you can get enough calcium from other foods.  Do not take a calcium supplement.  They can cause a blockage.  It is important to add high-calcium foods gradually to your diet and monitor for symptoms to avoid a blockage.  Do not add more fiber to your diet until your health care provider or registered dietitian nutritionist (RDN) tells you it's OK. Fiber is part of whole grains, fruits and vegetables (foods from plants) and needs to be slowly added back in to your diet when your body is healed.  Choose foods that have been safely handled and prepared to lower your risk of foodborne illness. Talk to your RDN or see the Food Safety Nutrition Therapy handout for more information.   Foods Recommended These foods are low in fat and fiber and will help with your GI symptoms. Food Group Foods Recommended  Grains  Choose grain foods with less than 2 grams of fiber per serving. Refined white flour products--for example, enriched white bread without seeds, crackers or pasta Cream of wheat or rice Grits (fine ground) Tortillas: white flour or corn White rice, well-cooked (do not rinse, or soak before cooking) Cold and hot cereals made from white or refined flour such as puffed rice or corn flakes  Protein Foods  Lean, very tender, well-cooked poultry or fish; red meats: beef, pork or lamb (slow  cook until soft; chop meats if you have stricture or ostomy) Eggs, well-cooked Smooth nut butters such as almond, peanut, or sunflower Tofu  Dairy  If you have lactose intolerance, drinking milk products from cows or goats may make diarrhea worse. Foods marked with an asterisk (*) have lactose. Milk: fat-free, 1% or 2% * (choose best tolerated) Lactose-free milk Buttermilk* Fortified non-dairy milks: almond, cashew, coconut, or rice (be aware that these options are not good sources of protein so you will need to eat an additional protein food) Kefir* (Don't include kefir in the diet until approved by your health care provider) Yogurt*/lactose-free yogurt (without nuts, fruit, granola or  chocolate) Mild cheese* (hard and aged cheeses tend to be lower in lactose such as cheddar, swiss or parmesan) Cottage cheese* or lactose-free cottage cheese Low-fat ice cream* or lactose-free ice cream Sherbet* (usually lower lactose)  Vegetables  Canned and well-cooked vegetables without seeds, skins, or hulls  Carrots or green beans, cooked White, red or yellow potatoes without skins Strained vegetable juice  Fruit Soft, and well-cooked fruits without skins, seeds, or membranes Canned fruit in juice: peaches, pears, or applesauce Fruit juice without pulp diluted by half with water may be tolerated better Fruit drinks fortified with vitamin C may be tolerated better than 100% fruit juice  Oils  When possible, choose healthy oils and fats, such as olive and canola oils, plant oils rather than solid fats.  Other  Broth and strained soups made from allowed foods Desserts (small portions) without whole grains, seeds, nuts, raisins, or coconut Jelly (clear)   Foods Not Recommended These foods are higher in fat and fiber and may make your GI symptoms worse.  Food Group Foods Not Recommended  Grains  Bread, whole wheat or with whole grain flour or seeds or nuts Brown rice, quinoa, kasha, barley Tortillas: whole grain Whole wheat pasta Whole grain and high-fiber cereals, including oatmeal, bran flakes or shredded wheat Popcorn  Protein Foods  Steak, pork chops, or other meats that are fatty or have gristle Fried meat, poultry, or fish Seafood with a tough or rubbery texture, such as shrimp Luncheon meats such as bologna and salami Sausage, bacon, or hot dogs Dried beans, peas, or lentils Hummus Sushi Nuts and chunky nut butters  Dairy  Whole milk Pea milk and soymilk (may cause diarrhea, gas, bloating, and abdominal pain) Cream Half-and-half Sour cream Yogurt with added fruit, nuts, or granola or chocolate  Vegetables  Alfalfa or bean sprouts (high fiber and risk for  bacteria) Raw or undercooked vegetables: beets; broccoli; brussels sprouts; cabbage; cauliflower; collard, mustard, or turnip greens; corn; cucumber; green peas or any kind of peas; kale; lima beans; mushrooms; okra; olives; pickles and relish; onions; parsnips; peppers; potato skins; sauerkraut; spinach; tomatoes  Fruit Raw fruit Dried fruit Avocado, berries, coconut Canned fruit in syrup Canned fruit with mandarin oranges, papaya or pineapple Fruit juice with pulp Prune juice Fruit skin  Oils  Pork rinds   Low-Fiber (8 grams) Sample 1-Day Menu  Breakfast  cup cream of wheat (0.5 gram fiber)  1 slice white toast (1 gram fiber)  1 teaspoon margarine, soft tub  2 scrambled eggs   Morning Snack 1 cup lactose-free nutrition supplement  Lunch 2 slices white bread (2 grams fiber)  3 tablespoons tuna  1 tablespoon mayonnaise  1 cup chicken noodle soup (1 gram fiber)   cup apple juice   Afternoon Snack 6 saltine crackers (0.5 gram fiber)  2 ounces low-fat cheddar cheese  Evening Meal 3 ounces tender chicken breast  1 cup white rice (0.5 gram fiber)   cup cooked canned green beans (2 grams fiber)   cup cranberry juice   Evening Snack 1 cup lactose-free nutrition supplement   Copyright 2020  Academy of Nutrition and Dietetics  High Fiber Nutrition Therapy  Fiber and fluid may help you feel less constipated and bloated and can also help ease diarrhea. Increase fiber slowly over the course of a few weeks. This will keep your symptoms from getting worse. Tips Tips for Adding Fiber to Your Eating Plan Slowly increase the amount of fiber you eat to 25 to 35 grams per day. Eat whole grain breads and cereals. Look for choices with 100% whole wheat, rye, oats, or bran as the first or second ingredient. Have brown or wild rice instead of white rice or potatoes. Enjoy a variety of grains. Good choices include barley, oats, farro, kamut, and quinoa. Bake with whole wheat flour. You can use  it to replace some white or all-purpose flour in recipes. Enjoy baked beans more often! Add dried beans and peas to casseroles or soups. Choose fresh fruit and vegetables instead of juices. Eat fruits and vegetables with peels or skins on. Compare food labels of similar foods to find higher fiber choices. On packaged foods, the amount of fiber per serving is listed on the Nutrition Facts label. Check the Nutrition Facts labels and try to choose products with at least 4 g dietary fiber per serving. Drink plenty of fluids. Set a goal of at least 8 cups per day. You may need even more fluid as you eat higher amounts of fiber. Fluid helps your body process fiber without discomfort. Foods Recommended Foods With at Least 4 g Fiber per Serving Food Group Choose  Grains ?- cup high-fiber cereal  Dried beans and peas  cup cooked red beans, kidney beans, large lima beans, navy beans, pinto beans, white beans, lentils, or black-eyed peas  Vegetables 1 artichoke (cooked)  Fruits  cup blackberries or raspberries 4 dried prunes   Foods With 1 to 3 g Fiber per Serving Food Group Choose  Grains 1 bagel (0.0-XFGH diameter) 1 slice whole wheat, cracked wheat, pumpernickel, or rye bread 2-inch square cornbread 4 whole wheat crackers 1 bran, blueberry, cornmeal, or English muffin  cup cereal with 1-3 g fiber per serving (check dietary fiber on the product's Nutrition Facts label) 2 tablespoons wheat germ or whole wheat flour  Fruits 1 apple (3-inch diameter) or  cup applesauce  cup apricots (canned) 1 banana  cup cherries (canned or fresh)  cup cranberries (fresh) 3 dates 2 medium figs (fresh)  cup fruit cocktail (canned)  grapefruit 1 kiwi fruit 1 orange (2-inch diameter) 1 peach (fresh) or  cup peaches (canned) 1 pear (fresh) or  cup pears (canned) 1 plum (2-inch diameter)  cup raisins  cup strawberries (fresh) 1 tangerine  Vegetables  cup bean sprouts (raw)  cup beets  (diced, canned)  cup broccoli, brussels sprouts, or cabbage  (cooked)  cup carrots  cup cauliflower  cup corn  cup eggplant  cup okra (boiled)  cup potatoes (baked or mashed)  cup spinach, kale, or turnip greens (cooked)  cup squash--winter, summer, or zucchini (cooked)  cup sweet potatoes or yams  cup tomatoes (canned)  Other 2 tablespoons almonds or peanuts 1 cup popcorn (popped)   High Fiber Vegetarian (Lacto-Ovo) Sample 1-Day Menu  Breakfast  cup bran cereal  1 banana  cup blueberries 1  cup 1% milk  Lunch 2 slices whole wheat bread  2 tablespoons hummus 1 ounce cheddar cheese 1 leaf lettuce 2 slices tomato  cup vegetarian baked beans 1 orange 1 cup 1% milk  Evening Meal Stir fry made with:  cup tempeh   cup brown rice 1 cup frozen broccoli 1 tablespoon soy sauce  cup peanuts  1 pear  Evening Snack 6 ounces fruit yogurt  1 cup air popped popcorn  High Fiber Vegan Sample 1-Day Menu  Breakfast  cup bran cereal  1 banana  cup blueberries 1 cup soymilk fortified with calcium, vitamin B12, and vitamin D  Lunch  cup chili with beans with:   cup tempeh crumbles  cup crushed whole wheat crackers 1 apple 1 cup soymilk fortified with calcium, vitamin B12, and vitamin D  Evening Meal 1 veggie burger  1 whole wheat bun  1 leaf lettuce  1 slice tomato Salad made with: 1 cup lettuce  cup chickpeas   cucumbers 1 tablespoon italian dressing 1 cup strawberries   Evening Snack  cup almonds  1 cup carrot sticks  High Fiber Sample 1-Day Menu  Breakfast 1/2 cup orange juice, with pulp  1/2 cup raisin bran 1 cup fat-free milk 1 cup coffee  Morning Snack 1 cup plain yogurt  2 cups water  Lunch 1 1/2 cups chili  1/2 cup kidney beans 1/2 cup soy crumble 2 tablespoons shredded cheese 8 whole wheat crackers 1 apple (with skin)   Evening Meal 2 ounces sliced chicken  1/4 cup tofu 2 cups mixed fresh vegetables 1 cup brown rice 1/2 cup  strawberries 1 cup hot tea  Evening Snack 2 tablespoons almonds  1 cup hot chocolate   Copyright 2020  Academy of Nutrition and [facility] Stay Proper nutrition can help your body recover from illness and injury.   Foods and beverages high in protein, vitamins, and minerals help rebuild muscle loss, promote healing, & reduce fall risk.   In addition to eating healthy foods, a nutrition shake is an easy, delicious way to get the nutrition you need during and after your hospital stay  It is recommended that you continue to drink 2 bottles per day of:  Ensure Plus and/or Premier Protein for at least 1 month (30 days) after your hospital stay   Tips for adding a nutrition shake into your routine: As allowed, drink one with vitamins or medications instead of water or juice Enjoy one as a tasty mid-morning or afternoon snack Drink cold or make a milkshake out of it Drink one instead of milk with cereal or snacks Use as a coffee creamer   Available at the following grocery stores and pharmacies:           * [place] [phone]            For COUPONS visit: www.ensure.com/join or [email or web address]-[email or web address]   Suggested Substitutions Ensure Plus = Boost Plus = Carnation Breakfast Essentials = Boost Compact Ensure Active Clear = Boost Breeze Glucerna Shake = Boost Glucose Control = Carnation Breakfast Essentials SUGAR FREE

## 2021-02-15 NOTE — Progress Notes (Signed)
Patient ID: Barbara Valdez, female   DOB: 06-Aug-1975, 46 y.o.   MRN: [id number] [facility] Progress Note  5 Days Post-Op  Subjective: CC-  Up in chair. Overall feeling well. Abdominal pain well controlled. No further n/v. Tolerating diet but not eating a lot. 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  Objective: Vital signs in last 24 hours: Temp:  [98.6 F (37 C)-98.7 F (37.1 C)] 98.7 F (37.1 C) (01/24 0530) Pulse Rate:  [81-97] 81 (01/24 0530) Resp:  [14-18] 18 (01/24 0530) BP: (104-113)/(72-83) 113/83 (01/24 0530) SpO2:  [100 %] 100 % (01/24 0530) Last BM Date: 02/14/21  Intake/Output from previous day: 01/23 0701 - 01/24 0700 In: 480 [P.O.:480] Out: 70 [Drains:70] Intake/Output this shift: No intake/output data recorded.  PE: Abd: soft, mild distention, ostomy pouch with scant stool and air in bag, stoma is pink and viable.  Midline incision with staples present and no erythema or drainage/ there is some dried blood. JP drain with minimal serosanguinous output - drain removed  Lab Results:  Recent Labs    02/14/21 0314 02/15/21 0311  WBC 7.9 5.6  HGB 8.5* 7.5*  HCT 28.5* 25.3*  PLT 436* 382   BMET Recent Labs    02/14/21 0314 02/15/21 0311  NA 138 139  K 3.5 3.4*  CL 105 104  CO2 26 29  GLUCOSE 114* 99  BUN 6 6  CREATININE 0.61 0.51  CALCIUM 8.0* 8.2*   PT/INR No results for input(s): LABPROT, INR in the last 72 hours. CMP     Component Value Date/Time   NA 139 02/15/2021 0311   K 3.4 (L) 02/15/2021 0311   CL 104 02/15/2021 0311   CO2 29 02/15/2021 0311   GLUCOSE 99 02/15/2021 0311   BUN 6 02/15/2021 0311   CREATININE 0.51 02/15/2021 0311   CALCIUM 8.2 (L) 02/15/2021 0311   PROT 5.5 (L) 02/15/2021 0311   ALBUMIN 2.5 (L) 02/15/2021 0311   AST 24 02/15/2021 0311   ALT 14 02/15/2021 0311   ALKPHOS 55 02/15/2021 0311   BILITOT 0.5 02/15/2021 0311   GFRNONAA >60 02/15/2021 0311   GFRAA >60  06/17/2016 1145   Lipase     Component Value Date/Time   LIPASE 24 02/07/2021 1202       Studies/Results: No results found.  Anti-infectives: Anti-infectives (From admission, onward)    Start     Dose/Rate Route Frequency Ordered Stop   02/10/21 0600  cefoTEtan (CEFOTAN) 2 g in sodium chloride 0.9 % 100 mL IVPB        2 g 200 mL/hr over 30 Minutes Intravenous On call to O.R. 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 Continue soft diet, protein shakes. Mobilize. Hemoglobin is down to 7.5 and sounds like she may be getting an iron transfusion today. Recommend keeping today and possible discharge home tomorrow if doing well.   FEN - soft, boost VTE - SCDs, Lovenox ID - none Foley- removed   LOS: 7 days    Wellington Hampshire, PA-C  [facility] 02/15/2021, 10:24 AM Please see Amion for pager number during day hours 7:00am-4:30pm

## 2021-02-16 DIAGNOSIS — K56609 Unspecified intestinal obstruction, unspecified as to partial versus complete obstruction: Secondary | ICD-10-CM | POA: Diagnosis present

## 2021-02-16 DIAGNOSIS — C187 Malignant neoplasm of sigmoid colon: Secondary | ICD-10-CM | POA: Diagnosis present

## 2021-02-16 DIAGNOSIS — Z539 Procedure and treatment not carried out, unspecified reason: Secondary | ICD-10-CM | POA: Diagnosis present

## 2021-02-16 DIAGNOSIS — F419 Anxiety disorder, unspecified: Secondary | ICD-10-CM | POA: Diagnosis present

## 2021-02-16 DIAGNOSIS — K529 Noninfective gastroenteritis and colitis, unspecified: Secondary | ICD-10-CM | POA: Diagnosis not present

## 2021-02-16 DIAGNOSIS — E46 Unspecified protein-calorie malnutrition: Secondary | ICD-10-CM | POA: Diagnosis present

## 2021-02-16 DIAGNOSIS — Z8249 Family history of ischemic heart disease and other diseases of the circulatory system: Secondary | ICD-10-CM | POA: Diagnosis not present

## 2021-02-16 DIAGNOSIS — K5190 Ulcerative colitis, unspecified, without complications: Secondary | ICD-10-CM | POA: Diagnosis present

## 2021-02-16 DIAGNOSIS — Z597 Insufficient social insurance and welfare support: Secondary | ICD-10-CM | POA: Diagnosis not present

## 2021-02-16 DIAGNOSIS — E876 Hypokalemia: Secondary | ICD-10-CM | POA: Diagnosis not present

## 2021-02-16 DIAGNOSIS — Z515 Encounter for palliative care: Secondary | ICD-10-CM | POA: Diagnosis not present

## 2021-02-16 DIAGNOSIS — D509 Iron deficiency anemia, unspecified: Secondary | ICD-10-CM | POA: Diagnosis present

## 2021-02-16 DIAGNOSIS — Z90710 Acquired absence of both cervix and uterus: Secondary | ICD-10-CM | POA: Diagnosis not present

## 2021-02-16 DIAGNOSIS — Z20822 Contact with and (suspected) exposure to covid-19: Secondary | ICD-10-CM | POA: Diagnosis present

## 2021-02-16 DIAGNOSIS — C179 Malignant neoplasm of small intestine, unspecified: Secondary | ICD-10-CM | POA: Diagnosis present

## 2021-02-16 DIAGNOSIS — R109 Unspecified abdominal pain: Secondary | ICD-10-CM | POA: Diagnosis present

## 2021-02-16 DIAGNOSIS — Z83438 Family history of other disorder of lipoprotein metabolism and other lipidemia: Secondary | ICD-10-CM | POA: Diagnosis not present

## 2021-02-16 DIAGNOSIS — K559 Vascular disorder of intestine, unspecified: Secondary | ICD-10-CM | POA: Diagnosis present

## 2021-02-16 LAB — CBC
HCT: 26 % — ABNORMAL LOW (ref 36.0–46.0)
Hemoglobin: 7.8 g/dL — ABNORMAL LOW (ref 12.0–15.0)
MCH: 24 pg — ABNORMAL LOW (ref 26.0–34.0)
MCHC: 30 g/dL (ref 30.0–36.0)
MCV: 80 fL (ref 80.0–100.0)
Platelets: 446 10*3/uL — ABNORMAL HIGH (ref 150–400)
RBC: 3.25 MIL/uL — ABNORMAL LOW (ref 3.87–5.11)
RDW: 18 % — ABNORMAL HIGH (ref 11.5–15.5)
WBC: 7.4 10*3/uL (ref 4.0–10.5)
nRBC: 0 % (ref 0.0–0.2)

## 2021-02-16 LAB — BASIC METABOLIC PANEL
Anion gap: 7 (ref 5–15)
BUN: 5 mg/dL — ABNORMAL LOW (ref 6–20)
CO2: 29 mmol/L (ref 22–32)
Calcium: 8.4 mg/dL — ABNORMAL LOW (ref 8.9–10.3)
Chloride: 102 mmol/L (ref 98–111)
Creatinine, Ser: 0.66 mg/dL (ref 0.44–1.00)
GFR, Estimated: >60 mL/min (ref >60)
Glucose, Bld: 105 mg/dL — ABNORMAL HIGH (ref 70–99)
Potassium: 3.3 mmol/L — ABNORMAL LOW (ref 3.5–5.1)
Sodium: 138 mmol/L (ref 135–145)

## 2021-02-16 LAB — MAGNESIUM: Magnesium: 2 mg/dL (ref 1.7–2.4)

## 2021-02-16 MED ORDER — ONDANSETRON HCL 4 MG PO TABS: 4.0000 mg | ORAL_TABLET | Freq: Three times a day (TID) | ORAL | 1 refills | Status: DC | PRN

## 2021-02-16 MED ORDER — ADULT MULTIVITAMIN W/MINERALS CH: 1.0000 | ORAL_TABLET | Freq: Every day | ORAL | 0 refills | Status: DC

## 2021-02-16 MED ORDER — PANTOPRAZOLE SODIUM 40 MG PO TBEC: 40.0000 mg | DELAYED_RELEASE_TABLET | Freq: Every day | ORAL | 1 refills | Status: DC

## 2021-02-16 MED ORDER — POLYETHYLENE GLYCOL 3350 17 G PO PACK: 17.0000 g | PACK | Freq: Every day | ORAL | 0 refills | Status: DC

## 2021-02-16 MED ORDER — ENSURE ENLIVE PO LIQD: 237.0000 mL | Freq: Two times a day (BID) | ORAL | Status: DC

## 2021-02-16 NOTE — TOC Transition Note (Signed)
Transition of Care [facility]) - CM/SW Discharge Note   Patient Details  Name: Cecille Mcclusky MRN: [id number] Date of Birth: Dec 30, 1975  [facility] of [facility]) CM/SW Contact:  Lennart Pall, LCSW Phone Number: 02/16/2021, 12:22 PM   Clinical Narrative:    Pt medically cleared for dc home today.  She is aware that TOC is unable to secure any HHRN coverage under charity care.  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  Pt had agreed earlier in stay to [facility] assist with PCP and she has a new patient appointment with [facility] on 2/8 @ 3:30pm.  Per pt request, have also alerted [facility] Financial counseling dept to her case and possible need for assist with Medicaid application.  No further TOC needs.   Final next level of care: Home/Self Care Barriers to Discharge: Barriers Resolved   Patient Goals and CMS Choice Patient states their goals for this hospitalization and ongoing recovery are:: return home      Discharge Placement                       Discharge Plan and Services In-house Referral: Clinical Social Work              DME Arranged: N/A DME Agency: NA                  Social Determinants of Health (SDOH) Interventions     Readmission Risk Interventions Readmission Risk Prevention Plan 02/11/2021  Post Dischage Appt Complete  Medication Screening Complete  Transportation Screening Complete  Some recent data might be hidden

## 2021-02-16 NOTE — Plan of Care (Signed)
°  Problem: Education: Goal: Knowledge of General Education information will improve Description: Including pain rating scale, medication(s)/side effects and non-pharmacologic comfort measures Outcome: Progressing   Problem: Elimination: Goal: Will not experience complications related to bowel motility Outcome: Progressing   Problem: Pain Managment: Goal: General experience of comfort will improve Outcome: Progressing   Problem: Safety: Goal: Ability to remain free from injury will improve Outcome: Progressing

## 2021-02-16 NOTE — Consult Note (Signed)
[facility] Nurse ostomy follow up Stoma type/location: LLQ, end colostomy  Stomal assessment/size: 1 1/4" budded, pale, pink Peristomal assessment: intact  Treatment options for stomal/peristomal skin: using 2" skin barrier ring Output liquid green/brown Ostomy pouching: 2pc. 2 1/4" pouching system with 2" skin barrier ring Education provided:  Patient removed old pouch, cleaned skin and applied barrier ring and pouch with minimal assistance.  She is a bit impulsive and anxious but did well.  Answered patient questions related to activity restrictions, diet, showering, supplies She is concerned about insurance because her coverage just started today from her employer.  She is concerned she may need to apply for MCD. She works at [employer] and works a job that does require minimal lifting but standing. I have sent her home with 8 skin barrier/pouches/barrier rings. Samples of filtered and beige pouch as well. Discussed intimacy and use of clothing for muffling gas sounds. Her daughter is a big support for her.  Enrolled patient in [place] Start Discharge program: Yes  [place] Nurse will follow along with you for continued support with ostomy teaching and care [place] MSN, [place], [place], [place], [place]

## 2021-02-16 NOTE — Progress Notes (Signed)
Nutrition Follow-up  DOCUMENTATION CODES:   Not applicable  INTERVENTION:  - will order Ensure Enlive BID, each supplement provides 350 kcal and 20 grams of protein. - placed Low Fiber Nutrition Therapy handout from the Academy of [employer] and Dietetics in Discharge Instructions/AVS.    NUTRITION DIAGNOSIS:   Increased nutrient needs related to acute illness, cancer and cancer related treatments as evidenced by estimated needs. -revised, ongoing  GOAL:   Patient will meet greater than or equal to 90% of their needs -progressing  MONITOR:   PO intake, Supplement acceptance, Labs, Weight trends  ASSESSMENT:   46 y.o. female with medical history of fibroids, iron deficiency anemia, colitis, and anxiety. She presented to the ED with recurrent abdominal pain. Pain has been ongoing for several months. She was admitted in November and discharged on abx but did not complete abx course due to side effects. She informed ED staff that she has been needing stool softeners for BMs and noticed increased blood and mucus per rectum.  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   Diet advanced to FLD on 1/21 and to Soft on 1/22 at 0913. Meal completion percentages documented since that time were 10% of lunch and dinner on 1/22 and 70% of breakfast and 75% of lunch yesterday.   Staff in with patient discussing colostomy care and related topics at the time of attempted visit.   She has not been weighed since admission on 1/16. Non-pitting edema to BLE documented in the edema section of flow sheet.  Discharge order entered earlier this AM. Discharge summary not yet entered. Surgery team's note from today states patient is stable for d/c from surgery standpoint.      Labs reviewed; K: 3.3 mmol/l, BUN: 5 mg/dl, Ca: 8.4  mg/dl.   Medications reviewed; 1 tablet multivitamin with minerals/day, 40 mg IV protonix BID.   Diet Order:   Diet Order             Diet - low sodium heart healthy           DIET SOFT Room service appropriate? Yes; Fluid consistency: Thin  Diet effective now                   EDUCATION NEEDS:   Education needs have been addressed  Skin:  Skin Assessment: Skin Integrity Issues: Skin Integrity Issues:: Incisions Incisions: abdomen (1/19)  Last BM:  1/24 (15 ml via colostomy)  Height:   Ht Readings from Last 1 Encounters:  02/07/21 5\' 6"  (1.676 m)    Weight:   Wt Readings from Last 1 Encounters:  02/07/21 60.8 kg      Estimated Nutritional Needs:  Kcal:  2000-2200 kcal Protein:  100-115 grams Fluid:  >/= 2 L/day     Jarome Matin, MS, RD, LDN Inpatient Clinical Dietitian RD pager # available in [employer]  After hours/weekend pager # available in [facility]

## 2021-02-16 NOTE — Progress Notes (Signed)
Patient ID: Barbara Valdez, female   DOB: 1975/02/14, 46 y.o.   MRN: [id number] [facility] Surgery Progress Note  6 Days Post-Op  Subjective: CC-  Up in chair. Overall feeling well. Abdominal pain well controlled. No further n/v. Tolerating diet but mostly drinking protein shakes.  Still with air in her colostomy and some minimal feculent output.  Objective: Vital signs in last 24 hours: Temp:  [97.6 F (36.4 C)-98.8 F (37.1 C)] 97.6 F (36.4 C) (01/25 0448) Pulse Rate:  [90-99] 96 (01/25 0448) Resp:  [17-18] 17 (01/25 0448) BP: (104-126)/(77-83) 126/83 (01/25 0448) SpO2:  [97 %-100 %] 100 % (01/25 0448) Last BM Date: 02/15/21  Intake/Output from previous day: 01/24 0701 - 01/25 0700 In: 837 [P.O.:720; IV Piggyback:117] Out: 15 [Stool:15] Intake/Output this shift: No intake/output data recorded.  PE: Abd: soft, mild distention, ostomy pouch with scant stool but a good amount of air in bag, stoma is pink and viable.  Midline incision with staples present and no erythema or drainage/ there is some dried blood.   Lab Results:  Recent Labs    02/15/21 1149 02/16/21 0311  WBC 6.3 7.4  HGB 8.0* 7.8*  HCT 26.5* 26.0*  PLT 434* 446*   BMET Recent Labs    02/15/21 0311 02/16/21 0311  NA 139 138  K 3.4* 3.3*  CL 104 102  CO2 29 29  GLUCOSE 99 105*  BUN 6 5*  CREATININE 0.51 0.66  CALCIUM 8.2* 8.4*   PT/INR No results for input(s): LABPROT, INR in the last 72 hours. CMP     Component Value Date/Time   NA 138 02/16/2021 0311   K 3.3 (L) 02/16/2021 0311   CL 102 02/16/2021 0311   CO2 29 02/16/2021 0311   GLUCOSE 105 (H) 02/16/2021 0311   BUN 5 (L) 02/16/2021 0311   CREATININE 0.66 02/16/2021 0311   CALCIUM 8.4 (L) 02/16/2021 0311   PROT 5.5 (L) 02/15/2021 0311   ALBUMIN 2.5 (L) 02/15/2021 0311   AST 24 02/15/2021 0311   ALT 14 02/15/2021 0311   ALKPHOS 55 02/15/2021 0311   BILITOT 0.5 02/15/2021 0311   GFRNONAA >60 02/16/2021 0311   GFRAA >60  06/17/2016 1145   Lipase     Component Value Date/Time   LIPASE 24 02/07/2021 1202       Studies/Results: No results found.  Anti-infectives: Anti-infectives (From admission, onward)    Start     Dose/Rate Route Frequency Ordered Stop   02/10/21 0600  cefoTEtan (CEFOTAN) 2 g in sodium chloride 0.9 % 100 mL IVPB        2 g 200 mL/hr over 30 Minutes Intravenous On call to O.R. 02/09/21 1431 02/10/21 1104        Assessment/Plan POD #6, s/p Laparoscopy and open sigmoid colectomy with end pouch Barbara Valdez procedure) 02/10/21 Dr. Hassell Done for sigmoid colon mass - Valdez 16.8, CT chest negative for mets - surgical path: Invasive moderately differentiated adenocarcinoma, 0/27 lymph nodes, margins negative >> path discussed with patient, she is scheduled for outpatient oncology appointment - JP drain removed 1/24 - Tolerating diet but colostomy output has slowed down. Continue soft diet, protein shakes. Mobilize.  -got Fe infusion yesterday -surgically stable for DC home today.  D/W Dr. Posey Pronto   FEN - soft, boost VTE - SCDs, Lovenox ID - none Foley- removed   LOS: 8 days    Barbara Valdez, [facility] Surgery 02/16/2021, 10:41 AM Please see Amion for pager number during day  hours 7:00am-4:30pm

## 2021-02-16 NOTE — Discharge Summary (Signed)
Physician Discharge Summary   Patient: Barbara Valdez MRN: [id number] DOB: Jul 02, 1975  Admit date:     02/07/2021  Discharge date: 02/16/21  Discharge Physician: Berle Mull   PCP: Pcp, No   Recommendations at discharge:    Follow up as recommended  May need IV iron infusion  Discharge Diagnoses Principal Problem:   Colitis Active Problems:   Iron deficiency anemia   Colon cancer ([place])  Resolved Problems:   * No resolved hospital problems. [facility]   Small bowel obstruction. 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 Patient has prior history of recurrent colitis.  Presently complains of abdominal pain and small bowel obstruction. CEA was elevated. GI was consulted.  Underwent colonoscopy. General surgery was consulted.  Underwent partial colectomy and colostomy creation on 1/19. Biopsy came back positive for adenocarcinoma. Hematology was consulted.  Patient will be following up with them outpatient in February. Drain removed on 1/24. Patient was given IV iron. Tolerated oral diet.   Microcytic iron deficiency anemia. Received Ferrlecit last admission with nausea and vomiting. Was treated with Feraheme. Outpatient follow-up with oncology.   Hypokalemia. Hypomagnesemia. Hypophosphatemia. Replaced.        Pain control - [employer] Controlled Substance Reporting System database was reviewed. and patient was instructed, not to drive, operate heavy machinery, perform activities at heights, swimming or participation in water activities or provide baby-sitting services while on Pain, Sleep and Anxiety Medications; until their outpatient Physician has advised to do so again. Also recommended to not to take more than prescribed Pain, Sleep and Anxiety Medications.   Consultants: General surgery Gastroenterology Oncology Procedures performed: colonoscopy  Disposition: Home Diet recommendation: Carb modified diet  DISCHARGE  MEDICATION: Allergies as of 02/16/2021       Reactions   Ferrlecit [na Ferric Gluc Cplx In Sucrose] Nausea And Vomiting        Medication List     STOP taking these medications    GINGER ROOT PO   Metamucil Smooth Texture 58.6 % powder Generic drug: psyllium       TAKE these medications    acetaminophen 500 MG tablet Commonly known as: TYLENOL Take 2 tablets (1,000 mg total) by mouth every 6 (six) hours as needed for mild pain.   methocarbamol 750 MG tablet Commonly known as: ROBAXIN Take 1 tablet (750 mg total) by mouth every 8 (eight) hours as needed for muscle spasms.   multivitamin with minerals Tabs tablet Take 1 tablet by mouth daily.   ondansetron 4 MG tablet Commonly known as: Zofran Take 1 tablet (4 mg total) by mouth every 8 (eight) hours as needed for nausea or vomiting.   oxyCODONE 5 MG immediate release tablet Commonly known as: Oxy IR/ROXICODONE Take 1 tablet (5 mg total) by mouth every 6 (six) hours as needed for severe pain.   pantoprazole 40 MG tablet Commonly known as: Protonix Take 1 tablet (40 mg total) by mouth daily.   polyethylene glycol 17 g packet Commonly known as: MIRALAX / GLYCOLAX Take 17 g by mouth daily. What changed: when to take this               Discharge Care Instructions  (From admission, onward)           Start     Ordered   02/16/21 0000  Leave dressing on - Keep it clean, dry, and intact until clinic visit        02/16/21 [phone]  Follow-up Information     Brahmbhatt, Parag, MD. Schedule an appointment as soon as possible for a visit in 3 month(s).   Specialty: Gastroenterology Why: Post hospitalization follow-up, discuss repeat colonoscopy Contact information: [place] [address] [phone]         Truitt Merle, MD. Go on 02/28/2021.   Specialties: Hematology, Oncology Why: Establish care with Dr. Truitt Merle oncology for newly diagnosed colon cancer, with CBC  and BMP Contact information: [place] [address] [phone]         [facility] [place]. Go on 02/22/2021.   Specialty: General Surgery Why: Your appointment is 02/22/21 at 9:30am for staple removal Please arrive 30 minutes prior to your appointment to check in and fill out paperwork. Engineer, civil (consulting) ID and Doctor, general practice information: [address] [place] [phone]        Johnathan Hausen, MD. Go on 03/07/2021.   Specialty: General Surgery Why: Your appointment is 03/07/21 at 9:10am Please arrive 15 minutes early to check in. Contact information: [address] [place] [address] [phone]                 Discharge Exam: Filed Weights   02/07/21 1142  Weight: 60.8 kg   Vitals:   02/15/21 0530 02/15/21 1534 02/15/21 2057 02/16/21 0448  BP: 113/83 104/77 113/78 126/83  Pulse: 81 99 90 96  Resp: 18 18 18 17   Temp: 98.7 F (37.1 C) 98.5 F (36.9 C) 98.8 F (37.1 C) 97.6 F (36.4 C)  TempSrc: Oral   Oral  SpO2: 100% 97% 100% 100%  Weight:      Height:        General: Appear in mild distress, no Rash; Oral Mucosa Clear, moist. no Abnormal Neck Mass Or lumps, Conjunctiva normal  Cardiovascular: S1 and S2 Present, no Murmur, Respiratory: good respiratory effort, Bilateral Air entry present and CTA, no Crackles, no wheezes Abdomen: Bowel Sound present, Soft and mild tenderness Extremities: no Pedal edema Neurology: alert and oriented to time, place, and person affect appropriate. no new focal deficit Gait not checked due to patient safety concerns   Condition at discharge: good  The results of significant diagnostics from this hospitalization (including imaging, microbiology, ancillary and laboratory) are listed below for reference.   Imaging Studies: CT CHEST W CONTRAST  Result Date: 02/11/2021 CLINICAL DATA:  Perforated colonic malignancy,  initial staging examination, CNS neoplasm EXAM: CT CHEST WITH CONTRAST TECHNIQUE: Multidetector CT imaging of the chest was performed during intravenous contrast administration. RADIATION DOSE REDUCTION: This exam was performed according to the departmental dose-optimization program which includes automated exposure control, adjustment of the mA and/or kV according to patient size and/or use of iterative reconstruction technique. CONTRAST:  34m OMNIPAQUE IOHEXOL 300 MG/ML  SOLN COMPARISON:  None. FINDINGS: Cardiovascular: No significant vascular findings. Normal heart size. No pericardial effusion. Mediastinum/Nodes: No enlarged mediastinal, hilar, or axillary lymph nodes. Thyroid gland, trachea, and esophagus demonstrate no significant findings. Lungs/Pleura: Bibasilar atelectasis. No focal pulmonary nodules or infiltrates. No pneumothorax or pleural effusion. Central airways are widely patent. Upper Abdomen: Hypoattenuating lesion within the right hepatic dome compatible with a benign cavernous hemangioma, better characterized on prior CT examination of the abdomen pelvis of 02/07/2021. Multiple mildly dilated gas-filled loops of small bowel are seen within the left upper quadrant suggestive of a postoperative adynamic ileus, not well assessed on this examination. The stomach  is not distended. No acute abnormality. Musculoskeletal: No acute bone abnormality. No lytic or blastic bone lesions are identified. IMPRESSION: No evidence of intrathoracic metastatic disease. Mildly dilated loops of small bowel within the upper abdomen suggestive of a mild postoperative adynamic ileus. Electronically Signed   By: Fidela Salisbury M.D.   On: 02/11/2021 20:49   CT ABDOMEN PELVIS W CONTRAST  Result Date: 02/07/2021 CLINICAL DATA:  Abdominal pain, cramping, constipation EXAM: CT ABDOMEN AND PELVIS WITH CONTRAST TECHNIQUE: Multidetector CT imaging of the abdomen and pelvis was performed using the standard protocol following  bolus administration of intravenous contrast. RADIATION DOSE REDUCTION: This exam was performed according to the departmental dose-optimization program which includes automated exposure control, adjustment of the mA and/or kV according to patient size and/or use of iterative reconstruction technique. CONTRAST:  143m OMNIPAQUE IOHEXOL 300 MG/ML  SOLN COMPARISON:  11/27/2020 FINDINGS: Lower chest: No acute abnormality. Hepatobiliary: No solid liver abnormality is seen. 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 No gallstones, gallbladder wall thickening, or biliary dilatation. Pancreas: Unremarkable. No pancreatic ductal dilatation or surrounding inflammatory changes. Spleen: Normal in size without significant abnormality. Adrenals/Urinary Tract: Adrenal glands are unremarkable. Kidneys are normal, without renal calculi, solid lesion, or hydronephrosis. Bladder is unremarkable. Stomach/Bowel: Stomach is within normal limits. Appendix appears normal. Large burden of stool throughout the extremely redundant proximal colon. There is severe, circumferential wall thickening and mucosal hyperenhancement involving the distal sigmoid colon and rectum to the anus, generally tethered appearance unchanged, involving at least 15-20 cm of distal sigmoid and rectum (series 2, image 65, series 4, image 60). There is again seen an abscess at the medial aspect of the rectosigmoid junction measuring approximately 3.0 x 1.6 cm (series 2, image 65). Vascular/Lymphatic: No significant vascular findings are present. Enlarged left external iliac lymph nodes measuring up to 1.1 x 0.9 cm (series 2, image 56) Reproductive: No mass or other significant abnormality. Other: No abdominal wall hernia or abnormality. No ascites. Musculoskeletal: No acute or significant osseous findings. IMPRESSION: 1. There is severe, circumferential wall thickening and  mucosal hyperenhancement involving the distal sigmoid colon and rectum to the anus, generally tethered appearance unchanged, involving at least 15-20 cm of distal sigmoid and rectum. 2. There is again seen an abscess at the medial aspect of the rectosigmoid junction measuring approximately 3.0 x 1.6 cm. 3. Findings are generally consistent with nonspecific infectious, inflammatory, or ischemic colitis, including inflammatory bowel disease such as Crohn's disease or ulcerative colitis; however an underlying malignancy is very difficult to exclude, particularly given substantial wall thickening. 4. Large burden of stool throughout the extremely redundant proximal colon, likely reflecting some degree of obstipation. 5. Enlarged left external iliac lymph nodes measuring up to 1.1 x 0.9 cm fall, nonspecific, possibly reactive, however nodal metastatic disease is not excluded per above. Electronically Signed   By: ADelanna AhmadiM.D.   On: 02/07/2021 15:31    Microbiology: Results for orders placed or performed during the hospital encounter of 02/07/21  Resp Panel by RT-PCR (Flu A&B, Covid) Nasopharyngeal Swab     Status: None   Collection Time: 02/08/21  8:50 AM   Specimen: Nasopharyngeal Swab; Nasopharyngeal(NP) swabs in vial transport medium  Result Value Ref Range Status   SARS Coronavirus 2 by RT PCR NEGATIVE NEGATIVE Final    Comment: (NOTE) SARS-CoV-2 target nucleic acids are NOT DETECTED.  The SARS-CoV-2 RNA is generally detectable in upper  respiratory specimens during the acute phase of infection. The lowest concentration of SARS-CoV-2 viral copies this assay can detect is 138 copies/mL. A negative result does not preclude SARS-Cov-2 infection and should not be used as the sole basis for treatment or other patient management decisions. A negative result may occur with  improper specimen collection/handling, submission of specimen other than nasopharyngeal swab, presence of viral mutation(s)  within the areas targeted by this assay, and inadequate number of viral copies(<138 copies/mL). A negative result must be combined with clinical observations, patient history, and epidemiological information. The expected result is Negative.  Fact Sheet for Patients:  EntrepreneurPulse.com.au  Fact Sheet for Healthcare Providers:  IncredibleEmployment.be  This test is no t yet approved or cleared by the [place] FDA and  has been authorized for detection and/or diagnosis of SARS-CoV-2 by FDA under an Emergency Use Authorization (EUA). This EUA will remain  in effect (meaning this test can be used) for the duration of the COVID-19 declaration under Section 564(b)(1) of the Act, 21 U.S.C.section 360bbb-3(b)(1), unless the authorization is terminated  or revoked sooner.       Influenza A by PCR NEGATIVE NEGATIVE Final   Influenza B by PCR NEGATIVE NEGATIVE Final    Comment: (NOTE) The Xpert Xpress SARS-CoV-2/FLU/RSV plus assay is intended as an aid in the diagnosis of influenza from Nasopharyngeal swab specimens and should not be used as a sole basis for treatment. Nasal washings and aspirates are unacceptable for Xpert Xpress SARS-CoV-2/FLU/RSV testing.  Fact Sheet for Patients: EntrepreneurPulse.com.au  Fact Sheet for Healthcare Providers: IncredibleEmployment.be  This test is not yet approved or cleared by the [place] FDA and has been authorized for detection and/or diagnosis of SARS-CoV-2 by FDA under an Emergency Use Authorization (EUA). This EUA will remain in effect (meaning this test can be used) for the duration of the COVID-19 declaration under Section 564(b)(1) of the Act, 21 U.S.C. section 360bbb-3(b)(1), unless the authorization is terminated or revoked.  Performed at [facility], [place] [address], [place], [place] [address]   Surgical PCR screen     Status:  None   Collection Time: 02/09/21 11:00 PM   Specimen: Nasal Mucosa; Nasal Swab  Result Value Ref Range Status   MRSA, PCR NEGATIVE NEGATIVE Final   Staphylococcus aureus NEGATIVE NEGATIVE Final    Comment: (NOTE) The Xpert SA Assay (FDA approved for NASAL specimens in patients 74 years of age and older), is one component of a comprehensive surveillance program. It is not intended to diagnose infection nor to guide or monitor treatment. Performed at [facility], [address] [place], [place] [address]     Labs: CBC: Recent Labs  Lab 02/11/21 0303 02/12/21 0302 02/13/21 0257 02/14/21 0314 02/15/21 0311 02/15/21 1149 02/16/21 0311  WBC 9.4 10.6* 8.5 7.9 5.6 6.3 7.4  NEUTROABS 7.2 8.0* 6.1 6.1 3.2  --   --   HGB 9.0* 8.6* 8.2* 8.5* 7.5* 8.0* 7.8*  HCT 29.7* 28.8* 27.6* 28.5* 25.3* 26.5* 26.0*  MCV 79.2* 80.2 79.5* 79.2* 79.6* 79.8* 80.0  PLT 331 356 372 436* 382 434* 098*   Basic Metabolic Panel: Recent Labs  Lab 02/11/21 0303 02/12/21 0302 02/13/21 0257 02/14/21 0314 02/15/21 0311 02/16/21 0311  NA 138 138 137 138 139 138  K 3.6 4.1 3.6 3.5 3.4* 3.3*  CL 105 105 105 105 104 102  CO2 26 27 25 26 29 29   GLUCOSE 117* 91 113* 114* 99 105*  BUN 10  10 8 6 6  5*  CREATININE 0.66 0.68 0.67 0.61 0.51 0.66  CALCIUM 8.4* 8.2* 8.1* 8.0* 8.2* 8.4*  MG 1.8 2.4 2.0 2.0 2.2 2.0  PHOS 3.1 2.1* 4.0 3.8 3.5  --    Liver Function Tests: Recent Labs  Lab 02/10/21 0310 02/12/21 0302 02/13/21 0257 02/14/21 0314 02/15/21 0311  AST 11* 14* 16 19 24   ALT 9 8 9 11 14   ALKPHOS 57 42 42 56 55  BILITOT 0.4 0.5 0.5 0.6 0.5  PROT 7.4 5.7* 5.4* 5.6* 5.5*  ALBUMIN 3.2* 2.7* 2.5* 2.5* 2.5*   CBG: No results for input(s): GLUCAP in the last 168 hours.  Discharge time spent: greater than 30 minutes.  Signed: Berle Mull, MD [facility] 02/16/2021

## 2021-02-21 ENCOUNTER — Ambulatory Visit: Payer: Medicaid Other | Admitting: Hematology

## 2021-02-28 ENCOUNTER — Encounter: Payer: Self-pay | Admitting: Hematology

## 2021-02-28 ENCOUNTER — Inpatient Hospital Stay: Payer: 59 | Attending: Hematology | Admitting: Hematology

## 2021-02-28 ENCOUNTER — Other Ambulatory Visit: Payer: Self-pay

## 2021-02-28 VITALS — BP 101/71 | HR 81 | Temp 98.5°F | Resp 17 | Ht 66.0 in | Wt 126.1 lb

## 2021-02-28 DIAGNOSIS — D509 Iron deficiency anemia, unspecified: Secondary | ICD-10-CM | POA: Insufficient documentation

## 2021-02-28 DIAGNOSIS — C187 Malignant neoplasm of sigmoid colon: Secondary | ICD-10-CM | POA: Diagnosis not present

## 2021-02-28 DIAGNOSIS — Z5111 Encounter for antineoplastic chemotherapy: Secondary | ICD-10-CM | POA: Insufficient documentation

## 2021-02-28 DIAGNOSIS — Z803 Family history of malignant neoplasm of breast: Secondary | ICD-10-CM | POA: Diagnosis not present

## 2021-02-28 DIAGNOSIS — R634 Abnormal weight loss: Secondary | ICD-10-CM | POA: Diagnosis not present

## 2021-02-28 DIAGNOSIS — Z79899 Other long term (current) drug therapy: Secondary | ICD-10-CM | POA: Insufficient documentation

## 2021-02-28 DIAGNOSIS — R109 Unspecified abdominal pain: Secondary | ICD-10-CM | POA: Insufficient documentation

## 2021-02-28 DIAGNOSIS — D500 Iron deficiency anemia secondary to blood loss (chronic): Secondary | ICD-10-CM

## 2021-02-28 MED ORDER — ONDANSETRON HCL 8 MG PO TABS: 8.0000 mg | ORAL_TABLET | Freq: Two times a day (BID) | ORAL | 1 refills | Status: DC | PRN

## 2021-02-28 MED ORDER — PROCHLORPERAZINE MALEATE 10 MG PO TABS: 10.0000 mg | ORAL_TABLET | Freq: Four times a day (QID) | ORAL | 1 refills | Status: DC | PRN

## 2021-02-28 MED ORDER — CAPECITABINE 500 MG PO TABS
ORAL_TABLET | ORAL | 0 refills | Status: DC
  Filled 2021-02-28: qty 70, fill #0
  Filled 2021-03-14: qty 70, 21d supply, fill #0

## 2021-02-28 NOTE — Progress Notes (Signed)
START ON PATHWAY REGIMEN - Colorectal     A cycle is every 21 days:     Capecitabine      Oxaliplatin   **Always confirm dose/schedule in your pharmacy ordering system**  Patient Characteristics: Postoperative without Neoadjuvant Therapy (Pathologic Staging), Colon, Stage IIB/C Tumor Location: Colon Therapeutic Status: Postoperative without Neoadjuvant Therapy (Pathologic Staging) AJCC M Category: cM0 AJCC T Category: pT4a AJCC N Category: pN0 AJCC 8 Stage Grouping: IIB Intent of Therapy: Curative Intent, Discussed with Patient

## 2021-02-28 NOTE — Progress Notes (Signed)
[facility]   [phone] [phone] Note   Patient Care Team: Pcp, No as PCP - General Johnathan Hausen, MD as Consulting Physician (General Surgery) Otis Brace, MD as Consulting Physician (Gastroenterology) Truitt Merle, MD as Consulting Physician (Hematology)  Date of Service:  02/28/2021   CHIEF COMPLAINTS/PURPOSE OF CONSULTATION:  Sigmoid Colon Cancer  REFERRING PHYSICIAN:  Dr. Hassell Done  ASSESSMENT & PLAN:  Barbara Valdez is a 46 y.o. postmenopausal female with  1. 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 Returned with worsening abdominal pain on 02/07/21; CT AP showed severe wall thickening and hyperenhancement involving distal sigmoid colon and rectum, 3 cm abscess at rectosigmoid junction, and one enlarged left external iliac lymph node. She was admitted and underwent colonoscopy on 02/09/21 by Dr. Alessandra Bevels showing partially obstructing tumor in distal sigmoid colon and inflamed mucosa in recto-sigmoid colon. Pathology showed adenomatous lesion with extensive high-grade dysplasia and nonspecific colitis. -She underwent emergent partial colectomy on 02/10/21 under Dr. Hassell Done. 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 Margins and lymph nodes negative. --I reviewed the work up with the patient and her daughter today. Given her stage IIB cancer and the increased risk of recurrence, I do recommend adjuvant chemotherapy.  I discussed the option of FOLFOX and CapeOx.  Because she is very young and overall healthy, I would advise CAPOX to further reduce her risk. I discussed this would consist of 4 cycles of CAPOX (IV oxaliplatin and oral Xeloda) q3weeks, then proceed with Xeloda alone for an additional 3 months, due to T4a disease and possible micro-perforation  --Chemotherapy consent: Side effects including but  does not not limited to, fatigue, nausea, vomiting, diarrhea, hair loss, neuropathy, fluid retention, renal and kidney dysfunction, neutropenic fever, needed for blood transfusion, bleeding, were discussed with patient in great detail. She agrees to proceed. -The goal of chemotherapy is curative -we will plan to begin CAPOX on 03/14/21.  2.  Iron deficiency anemia -likely secondary to #1 -she received B12 and IV Ferrlecit on 11/29/20 and IV Feraheme on 02/15/21. -we will plan to give her another dose of IV iron next week.  3. Genetics -given her young age and family history, she qualifies for genetic testing. I will refer her today.   PLAN:  -chemo education -referral to genetics -IV iron next week (per her insurance preference)  -lab, f/u, and plan to start CAPOX on 2/20 -phone visit one week after first cycle   Oncology History Overview Note   Cancer Staging  Cancer of sigmoid colon [facility]) Staging form: Colon and Rectum, AJCC 8th Edition - Pathologic stage from 02/10/2021: Stage IIB (pT4a, pN0, cM0) - Signed by Truitt Merle, MD on 02/28/2021    Cancer of sigmoid colon ([place])  11/27/2020 Imaging   CLINICAL DATA:  Abdominal pain that is began Monday. Patient reports seen scant amounts of dark blood in stool. Feels bloated.   EXAM: CT ABDOMEN AND PELVIS WITH CONTRAST  IMPRESSION: 1. Proctocolitis involving the mid and distal sigmoid colon and upper rectum with a 3.9 cm gas containing interloop abscess centered in the sigmoid mesocolon. 2. Severe fecal burden.  No findings of bowel obstruction. 3. An enlarged left common iliac lymph node is likely reactive. 4. A 2.7 cm hemangioma is noted in the right hepatic lobe. 5. Mild focal bronchiolitis in the right lower lobe.   02/07/2021 Imaging   CLINICAL  DATA:  Abdominal pain, cramping, constipation   EXAM: CT ABDOMEN AND PELVIS WITH CONTRAST  IMPRESSION: 1. There is severe, circumferential wall thickening and mucosal hyperenhancement  involving the distal sigmoid colon and rectum to the anus, generally tethered appearance unchanged, involving at least 15-20 cm of distal sigmoid and rectum. 2. There is again seen an abscess at the medial aspect of the rectosigmoid junction measuring approximately 3.0 x 1.6 cm. 3. Findings are generally consistent with nonspecific infectious, inflammatory, or ischemic colitis, including inflammatory bowel disease such as Crohn's disease or ulcerative colitis; however an underlying malignancy is very difficult to exclude, particularly given substantial wall thickening. 4. Large burden of stool throughout the extremely redundant proximal colon, likely reflecting some degree of obstipation. 5. Enlarged left external iliac lymph nodes measuring up to 1.1 x 0.9 cm fall, nonspecific, possibly reactive, however nodal metastatic disease is not excluded per above.   02/09/2021 Initial Biopsy   FINAL MICROSCOPIC DIAGNOSIS:   A. COLON MASS, SIGMOID, BIOPSY:  - Adenomatous lesion with extensive high-grade dysplasia, see comment   B. RECTAL, COLON, SIGMOID, BIOPSY:  - Mild acute/ subacute nonspecific colitis, see comment  - Negative for definite features of chronicity, granulomas or dysplasia   COMMENT:  A.  Evidence of invasive carcinoma is not seen in the submitted biopsies but the biopsy fragments appear superficial and a more severe deeper process cannot be ruled out.  B.  Histologic features in the rectosigmoid biopsies are not compatible with untreated inflammatory bowel disease.  Differential diagnosis can include infection, drug-effect and stercoral proctitis among other possibilities.    02/09/2021 Procedure   Colonoscopy, Dr. Alessandra Bevels  Impression: - Preparation of the colon was fair. - Likely malignant partially obstructing tumor in the distal sigmoid colon. Biopsied. Tattooed. - Congested, inflamed and thickened folds of the mucosa in the recto-sigmoid colon. Biopsied. Tattooed.    02/10/2021 Cancer Staging   Staging form: Colon and Rectum, AJCC 8th Edition - Pathologic stage from 02/10/2021: Stage IIB (pT4a, pN0, cM0) - Signed by Truitt Merle, MD on 02/28/2021 Stage prefix: Initial diagnosis Total positive nodes: 0 Histologic grading system: 4 grade system Histologic grade (G): G2 Residual tumor (R): R0 - None    02/10/2021 Definitive Surgery   FINAL MICROSCOPIC DIAGNOSIS:   A. COLON, SIGMOID:  - Invasive moderately differentiated adenocarcinoma.  - Twenty-seven lymph nodes, negative for metastatic carcinoma (0/27).  - See oncology table below.   B. COLON, DISTAL, SIGMOID:  - Segment of colon with serosal adhesions, acute inflammation, and tattoo pigment.  - Negative for malignancy.    02/11/2021 Initial Diagnosis   Cancer of sigmoid colon ([place])   03/14/2021 -  Chemotherapy   Patient is on Treatment Plan : COLORECTAL Xelox (Capeox) q21d        HISTORY OF PRESENTING ILLNESS:  Barbara Valdez 46 y.o. female is a here because of colon cancer. The patient was referred by Dr. Hassell Done. The patient presents to the clinic today accompanied by her daughter.   She reports she initially developed abdominal pain over a year ago. She states she was diagnosed with several different things but no definitive diagnosis. She notes colonoscopy was initially scheduled for 08/2020, but her prep was not adequate. She reports the pain worsened in 11/2020, and when she went to the ED, she was diagnosed with proctocolitis. She notes she was told colonoscopy could not be performed at that time because she was too inflamed.   She returned to the ED on 02/07/21 with worsening pain  and constipation. She reports she was taken for colonoscopy during admission. This was done by Dr. Alessandra Bevels on 02/09/21 and showed a partially-obstructing tumor in distal sigmoid colon, as well as inflamed mucosa in recto-sigmoid colon. Pathology confirmed adenomatous lesion with extensive high-grade dysplasia,  although a more severe deeper process cannot be ruled out. Rectal biopsy showed nonspecific colitis.  She was taken for emergent partial colectomy on 02/10/21 by Dr. Hassell Done. Pathology showed a 9.5 cm invasive moderately differentiated adenocarcinoma. Margins and all 27 lymph nodes were negative.   Today the patient notes they felt/feeling prior/after... -weight loss of ~40 lbs over the past year (baseline is about 160 lbs) -she reports she is eating better and supplementing with Ensure.   She has a PMHx of.... -s/p hysterectomy d/t menorrhagia    Socially... 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 -she has two daughters, ages 61 and 77. -she reports colon cancer in her maternal uncle in his 32's, breast cancer in her maternal aunt and in her cousin (the aunt's daughter). -she does not drink alcohol (not even socially) and has never smoked.   REVIEW OF SYSTEMS:    Constitutional: Denies fevers, chills or abnormal night sweats Eyes: Denies blurriness of vision, double vision or watery eyes Ears, nose, mouth, throat, and face: Denies mucositis or sore throat Respiratory: Denies cough, dyspnea or wheezes Cardiovascular: Denies palpitation, chest discomfort or lower extremity swelling Gastrointestinal:  Denies nausea, heartburn or change in bowel habits Skin: Denies abnormal skin rashes Lymphatics: Denies new lymphadenopathy or easy bruising Neurological:Denies numbness, tingling or new weaknesses Behavioral/Psych: Mood is stable, no new changes  All other systems were reviewed with the patient and are negative.   MEDICAL HISTORY:  Past Medical History:  Diagnosis Date   Anemia    Anxiety    Complication of anesthesia    took a long time to wake up after surgery    SURGICAL HISTORY: Past Surgical History:  Procedure Laterality Date   ABDOMINAL HYSTERECTOMY Bilateral 09/26/2016   Procedure: HYSTERECTOMY ABDOMINAL W/ BILATERAL SALPINGECTOMY;  Surgeon: Emily Filbert,  MD;  Location: [facility];  Service: Gynecology;  Laterality: Bilateral;   BIOPSY  02/09/2021   Procedure: BIOPSY;  Surgeon: Otis Brace, MD;  Location: [facility];  Service: Gastroenterology;;   BREAST BIOPSY Left    COLONOSCOPY N/A 02/09/2021   Procedure: COLONOSCOPY;  Surgeon: Otis Brace, MD;  Location: [facility];  Service: Gastroenterology;  Laterality: N/A;   LAPAROSCOPIC PARTIAL COLECTOMY N/A 02/10/2021   Procedure: ROBOTIC ASSISTED PARTIAL COLECTOMY WITH COLOSTOMY;  Surgeon: Johnathan Hausen, MD;  Location: [facility];  Service: General;  Laterality: N/A;   SUBMUCOSAL TATTOO INJECTION  02/09/2021   Procedure: SUBMUCOSAL TATTOO INJECTION;  Surgeon: Otis Brace, MD;  Location: [facility];  Service: Gastroenterology;;   TUBAL LIGATION      SOCIAL HISTORY: Social History   Socioeconomic History   Marital status: Single    Spouse name: Not on file   Number of children: 2   Years of education: Not on file   Highest education level: Not on file  Occupational History   Not on file  Tobacco Use   Smoking status: Never   Smokeless tobacco: Never  Vaping Use   Vaping Use: Never used  Substance and Sexual Activity   Alcohol use: Never   Drug use: No   Sexual activity: Yes    Birth control/protection: Surgical  Other Topics Concern   Not on file  Social History Narrative   Not  on file   Social Determinants of Health   Financial Resource Strain: Not on file  Food Insecurity: Not on file  Transportation Needs: Not on file  Physical Activity: Not on file  Stress: Not on file  Social Connections: Not on file  Intimate Partner Violence: Not on file    FAMILY HISTORY: Family History  Problem Relation Age of Onset   Hyperlipidemia Father    Cancer Maternal Aunt        breast cancer   Cancer Maternal Uncle 32       colon cancer   Hypertension Other    Cancer Cousin        breast cancer   Breast cancer Neg Hx     ALLERGIES:  is allergic to ferrlecit [na  ferric gluc cplx in sucrose].  MEDICATIONS:  Current Outpatient Medications  Medication Sig Dispense Refill   acetaminophen (TYLENOL) 500 MG tablet Take 2 tablets (1,000 mg total) by mouth every 6 (six) hours as needed for mild pain. 30 tablet 0   methocarbamol (ROBAXIN) 750 MG tablet Take 1 tablet (750 mg total) by mouth every 8 (eight) hours as needed for muscle spasms. 30 tablet 0   Multiple Vitamin (MULTIVITAMIN WITH MINERALS) TABS tablet Take 1 tablet by mouth daily. 150 tablet 0   ondansetron (ZOFRAN) 4 MG tablet Take 1 tablet (4 mg total) by mouth every 8 (eight) hours as needed for nausea or vomiting. 30 tablet 1   ondansetron (ZOFRAN) 8 MG tablet Take 1 tablet (8 mg total) by mouth 2 (two) times daily as needed for refractory nausea / vomiting. Start on day 3 after chemotherapy. 30 tablet 1   oxyCODONE (OXY IR/ROXICODONE) 5 MG immediate release tablet Take 1 tablet (5 mg total) by mouth every 6 (six) hours as needed for severe pain. 15 tablet 0   pantoprazole (PROTONIX) 40 MG tablet Take 1 tablet (40 mg total) by mouth daily. 30 tablet 1   polyethylene glycol (MIRALAX / GLYCOLAX) 17 g packet Take 17 g by mouth daily. 14 each 0   prochlorperazine (COMPAZINE) 10 MG tablet Take 1 tablet (10 mg total) by mouth every 6 (six) hours as needed (Nausea or vomiting). 30 tablet 1   No current facility-administered medications for this visit.    PHYSICAL EXAMINATION: ECOG PERFORMANCE STATUS: 1 - Symptomatic but completely ambulatory  Vitals:   02/28/21 1508  BP: 101/71  Pulse: 81  Resp: 17  Temp: 98.5 F (36.9 C)  SpO2: 100%   Filed Weights   02/28/21 1508  Weight: 126 lb 1.6 oz (57.2 kg)    GENERAL:alert, no distress and comfortable SKIN: skin color normal, no rashes or significant lesions EYES: normal, Conjunctiva are pink and non-injected, sclera clear  NEURO: alert & oriented x 3 with fluent speech  LABORATORY DATA:  I have reviewed the data as listed CBC Latest Ref Rng &  Units 02/16/2021 02/15/2021 02/15/2021  WBC 4.0 - 10.5 K/uL 7.4 6.3 5.6  Hemoglobin 12.0 - 15.0 g/dL 7.8(L) 8.0(L) 7.5(L)  Hematocrit 36.0 - 46.0 % 26.0(L) 26.5(L) 25.3(L)  Platelets 150 - 400 K/uL 446(H) 434(H) 382    CMP Latest Ref Rng & Units 02/16/2021 02/15/2021 02/14/2021  Glucose 70 - 99 mg/dL 105(H) 99 114(H)  BUN 6 - 20 mg/dL 5(L) 6 6  Creatinine 0.44 - 1.00 mg/dL 0.66 0.51 0.61  Sodium 135 - 145 mmol/L 138 139 138  Potassium 3.5 - 5.1 mmol/L 3.3(L) 3.4(L) 3.5  Chloride 98 - 111 mmol/L 102  104 105  CO2 22 - 32 mmol/L 29 29 26   Calcium 8.9 - 10.3 mg/dL 8.4(L) 8.2(L) 8.0(L)  Total Protein 6.5 - 8.1 g/dL - 5.5(L) 5.6(L)  Total Bilirubin 0.3 - 1.2 mg/dL - 0.5 0.6  Alkaline Phos 38 - 126 U/L - 55 56  AST 15 - 41 U/L - 24 19  ALT 0 - 44 U/L - 14 11     RADIOGRAPHIC STUDIES: I have personally reviewed the radiological images as listed and agreed with the findings in the report. CT CHEST W CONTRAST  Result Date: 02/11/2021 CLINICAL DATA:  Perforated colonic malignancy, initial staging examination, CNS neoplasm EXAM: CT CHEST WITH CONTRAST TECHNIQUE: Multidetector CT imaging of the chest was performed during intravenous contrast administration. RADIATION DOSE REDUCTION: This exam was performed according to the departmental dose-optimization program which includes automated exposure control, adjustment of the mA and/or kV according to patient size and/or use of iterative reconstruction technique. CONTRAST:  37m OMNIPAQUE IOHEXOL 300 MG/ML  SOLN COMPARISON:  None. FINDINGS: Cardiovascular: No significant vascular findings. Normal heart size. No pericardial effusion. Mediastinum/Nodes: No enlarged mediastinal, hilar, or axillary lymph nodes. Thyroid gland, trachea, and esophagus demonstrate no significant findings. Lungs/Pleura: Bibasilar atelectasis. No focal pulmonary nodules or infiltrates. No pneumothorax or pleural effusion. Central airways are widely patent. Upper Abdomen: Hypoattenuating  lesion within the right hepatic dome compatible with a benign cavernous hemangioma, better characterized on prior CT examination of the abdomen pelvis of 02/07/2021. Multiple mildly dilated gas-filled loops of small bowel are seen within the left upper quadrant suggestive of a postoperative adynamic ileus, not well assessed on this examination. The stomach is not distended. No acute abnormality. Musculoskeletal: No acute bone abnormality. No lytic or blastic bone lesions are identified. IMPRESSION: No evidence of intrathoracic metastatic disease. Mildly dilated loops of small bowel within the upper abdomen suggestive of a mild postoperative adynamic ileus. Electronically Signed   By: AFidela SalisburyM.D.   On: 02/11/2021 20:49   CT ABDOMEN PELVIS W CONTRAST  Result Date: 02/07/2021 CLINICAL DATA:  Abdominal pain, cramping, constipation EXAM: CT ABDOMEN AND PELVIS WITH CONTRAST TECHNIQUE: Multidetector CT imaging of the abdomen and pelvis was performed using the standard protocol following bolus administration of intravenous contrast. RADIATION DOSE REDUCTION: This exam was performed according to the departmental dose-optimization program which includes automated exposure control, adjustment of the mA and/or kV according to patient size and/or use of iterative reconstruction technique. CONTRAST:  1051mOMNIPAQUE IOHEXOL 300 MG/ML  SOLN COMPARISON:  11/27/2020 FINDINGS: Lower chest: No acute abnormality. Hepatobiliary: No solid liver abnormality is seen. 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 No gallstones, gallbladder wall thickening, or biliary dilatation. Pancreas: Unremarkable. No pancreatic ductal dilatation or surrounding inflammatory changes. Spleen: Normal in size without significant abnormality. Adrenals/Urinary Tract: Adrenal glands are unremarkable. Kidneys are normal, without renal calculi, solid lesion, or  hydronephrosis. Bladder is unremarkable. Stomach/Bowel: Stomach is within normal limits. Appendix appears normal. Large burden of stool throughout the extremely redundant proximal colon. There is severe, circumferential wall thickening and mucosal hyperenhancement involving the distal sigmoid colon and rectum to the anus, generally tethered appearance unchanged, involving at least 15-20 cm of distal sigmoid and rectum (series 2, image 65, series 4, image 60). There is again seen an abscess at the medial aspect of the rectosigmoid junction measuring approximately 3.0 x 1.6 cm (series 2, image 65). Vascular/Lymphatic: No significant vascular findings are present. Enlarged  left external iliac lymph nodes measuring up to 1.1 x 0.9 cm (series 2, image 56) Reproductive: No mass or other significant abnormality. Other: No abdominal wall hernia or abnormality. No ascites. Musculoskeletal: No acute or significant osseous findings. IMPRESSION: 1. There is severe, circumferential wall thickening and mucosal hyperenhancement involving the distal sigmoid colon and rectum to the anus, generally tethered appearance unchanged, involving at least 15-20 cm of distal sigmoid and rectum. 2. There is again seen an abscess at the medial aspect of the rectosigmoid junction measuring approximately 3.0 x 1.6 cm. 3. Findings are generally consistent with nonspecific infectious, inflammatory, or ischemic colitis, including inflammatory bowel disease such as Crohn's disease or ulcerative colitis; however an underlying malignancy is very difficult to exclude, particularly given substantial wall thickening. 4. Large burden of stool throughout the extremely redundant proximal colon, likely reflecting some degree of obstipation. 5. Enlarged left external iliac lymph nodes measuring up to 1.1 x 0.9 cm fall, nonspecific, possibly reactive, however nodal metastatic disease is not excluded per above. Electronically Signed   By: Delanna Ahmadi M.D.    On: 02/07/2021 15:31     Orders Placed This Encounter  Procedures   CBC with Differential/Platelet    Standing Status:   Standing    Number of Occurrences:   50    Standing Expiration Date:   02/28/2022   Comprehensive metabolic panel    Standing Status:   Standing    Number of Occurrences:   50    Standing Expiration Date:   02/28/2022   CEA (IN HOUSE-[facility])    Standing Status:   Standing    Number of Occurrences:   5    Standing Expiration Date:   02/28/2022   Ambulatory referral to Genetics    Referral Priority:   Routine    Referral Type:   Consultation    Referral Reason:   Specialty Services Required    Number of Visits Requested:   1    All questions were answered. The patient knows to call the clinic with any problems, questions or concerns. The total time spent in the appointment was 60 minutes.     Truitt Merle, MD 02/28/2021 10:47 PM  I, Wilburn Mylar, am acting as scribe for Truitt Merle, MD.   I have reviewed the above documentation for accuracy and completeness, and I agree with the above.

## 2021-02-28 NOTE — Progress Notes (Signed)
I met with Barbara Valdez and her daughter after  her consultation with Dr Burr Medico.  I explained my role as a nurse navigator and provided my contact information. I explained the services provided at [facility] and provided written information.  I explained the alight grant and let  her know one of the financial advisors will reach out to  her at the time of her chemo education class. I told her that she will be scheduled for chemotherapy education class prior to receiving chemotherapy.  I told her our schedulers will call her with those appts. I explained that Wells Guiles our oral chemotherapy pharmacist will reach out to her for xeloda education. All questions were answered.  She verbalized understanding.

## 2021-03-01 ENCOUNTER — Telehealth: Payer: Self-pay

## 2021-03-01 ENCOUNTER — Telehealth: Payer: Self-pay | Admitting: Hematology

## 2021-03-01 ENCOUNTER — Ambulatory Visit (HOSPITAL_COMMUNITY)
Admission: RE | Admit: 2021-03-01 | Discharge: 2021-03-01 | Disposition: A | Discharge status: Home / Self Care | Payer: 59 | Source: Ambulatory Visit | Attending: General Surgery | Admitting: General Surgery

## 2021-03-01 ENCOUNTER — Other Ambulatory Visit (HOSPITAL_COMMUNITY): Payer: Self-pay

## 2021-03-01 ENCOUNTER — Telehealth: Payer: Self-pay | Admitting: Pharmacist

## 2021-03-01 ENCOUNTER — Other Ambulatory Visit: Payer: Self-pay | Admitting: Hematology

## 2021-03-01 ENCOUNTER — Encounter: Payer: Self-pay | Admitting: Hematology

## 2021-03-01 DIAGNOSIS — Z933 Colostomy status: Secondary | ICD-10-CM | POA: Diagnosis present

## 2021-03-01 DIAGNOSIS — K5901 Slow transit constipation: Secondary | ICD-10-CM

## 2021-03-01 DIAGNOSIS — C189 Malignant neoplasm of colon, unspecified: Secondary | ICD-10-CM | POA: Diagnosis not present

## 2021-03-01 DIAGNOSIS — K5900 Constipation, unspecified: Secondary | ICD-10-CM | POA: Insufficient documentation

## 2021-03-01 DIAGNOSIS — Z433 Encounter for attention to colostomy: Secondary | ICD-10-CM | POA: Diagnosis not present

## 2021-03-01 NOTE — Telephone Encounter (Signed)
Oral Oncology Patient Advocate Encounter   Received notification from [employer] that prior authorization for Xeloda is required.   PA submitted on CoverMyMeds Key [id number] Status is pending   Oral Oncology Clinic will continue to follow.   Barbara Valdez Phone [phone] Fax [phone] 03/01/2021 8:22 AM

## 2021-03-01 NOTE — Discharge Instructions (Addendum)
I will enroll with Barbara Valdez [phone] They will call you to confirm. We are using 2 1/4" 2 piece pouch With barrier ring Recommend filtered pouch Try lubricating deodorant.

## 2021-03-01 NOTE — Telephone Encounter (Signed)
Sch per 2/6 inbasket, unable to leave message,mailed calendar

## 2021-03-01 NOTE — Progress Notes (Signed)
[facility]   Reason for visit:  LLQ colostomy HPI:  Colon cancer, sigmoid colectomy and colostomy ROS  Review of Systems  Constitutional:  Positive for activity change.       She enjoys working out. Has not been doing this lately.  I discuss getting back to her interests and hobbies. Discuss ostomy belt for added security when working out if desired.   Gastrointestinal:  Positive for constipation.       Thick pellets of stool from pouch.  Implementing stool softeners and increasing water intake  Encourage activity increase as tolerated.   Skin:  Positive for wound.       Midline abdominal wound, healing  Open at distal end only (scabbing)    Psychiatric/Behavioral: Negative.    All other systems reviewed and are negative. Vital signs:  BP 98/63 (BP Location: Right Arm)    Pulse 69    Temp 97.9 F (36.6 C) (Oral)    Resp 17    LMP 10/23/2016    SpO2 100%  Exam:  Physical Exam Vitals reviewed.  Constitutional:      Appearance: Normal appearance. She is normal weight.  Abdominal:     Palpations: Abdomen is soft.  Neurological:     Mental Status: She is alert and oriented to person, place, and time.  Psychiatric:        Mood and Affect: Mood normal.        Behavior: Behavior normal.        Thought Content: Thought content normal.        Judgment: Judgment normal.    Stoma type/location:  LLQ colostomy, budded Stomal assessment/size:  os at center, 32 mm, round Peristomal assessment: skin intact  midline wound is healed except distal end, some scabbing present. I provided 2x2 gauze and tape to cover this after cleansing daily.  Treatment options for stomal/peristomal skin: barrier ring and 2 piece pouch Output: Thick brown pellets of stool consistent with constipation. Drinks 2 bottles water/day. We discussed drinking 64 ounces daily and increasing fiber in diet.  She can also take stool softener as needed.  No abdominal fullness or tenderness.  Empties pouch 3-4 times  daily.  Ostomy pouching: 2pc. 2 1/4" opaque pouch with barrier ring.  Added lubricating deodorant to aid in stool sliding to bottom of pouch.   Education provided:  See above. Will enroll with [facility] today.     Impression/dx  Colostomy care Discussion  Call back as needed.  No issues with care at this time.  Plan  Will begin order supply with [facility]. Confirmed address with patient.  She is staying with her daughter currently on [address] Listed in chart.     Visit time: 55 minutes.   Domenic Moras FNP-BC

## 2021-03-01 NOTE — Telephone Encounter (Addendum)
Oral Oncology Pharmacist Encounter  Received new prescription for Xeloda (capecitabine) for the treatment of stage IIB colon cancer in conjunction with oxaliplatin, planned duration 4 cycles, followed by Xeloda monotherapy for 3 months.  BMP and CBC from 02/16/21 assessed, no baseline dose adjustments required at this time. Prescription dose and frequency assessed for appropriateness. Appropriate for therapy initiation.   Current medication list in Epic reviewed, DDIs with Xeloda identified: Category C DDI between Xeloda and Pantoprazole - proton-pump inhibitors can decrease efficacy of Xeloda - will discuss with patient alternatives to pantoprazole, such as H2RA's like famotidine while on Xeloda. Category C DDI between Xeloda and Ondansetron due to risk of Qtc prolongation with fluorouracil products. Noted patient only taking PRN and PO route, risk higher with IV administration. No change in therapy warranted at this time.   Evaluated chart and no patient barriers to medication adherence noted.   Patient agreement for treatment documented in MD note on 02/28/21.  Prescription has been e-scribed to the [facility] for benefits analysis and approval.  Oral Oncology Clinic will continue to follow for insurance authorization, copayment issues, initial counseling and start date.  Leron Croak, PharmD, BCPS Hematology/Oncology Clinical Pharmacist [facility] [place] [phone] 03/01/2021 8:27 AM

## 2021-03-02 ENCOUNTER — Ambulatory Visit: Payer: 59 | Attending: Physician Assistant | Admitting: Physician Assistant

## 2021-03-02 ENCOUNTER — Encounter: Payer: Self-pay | Admitting: Hematology

## 2021-03-02 DIAGNOSIS — K529 Noninfective gastroenteritis and colitis, unspecified: Secondary | ICD-10-CM

## 2021-03-02 DIAGNOSIS — Z09 Encounter for follow-up examination after completed treatment for conditions other than malignant neoplasm: Secondary | ICD-10-CM

## 2021-03-02 DIAGNOSIS — C187 Malignant neoplasm of sigmoid colon: Secondary | ICD-10-CM

## 2021-03-02 DIAGNOSIS — K625 Hemorrhage of anus and rectum: Secondary | ICD-10-CM

## 2021-03-02 DIAGNOSIS — Z0000 Encounter for general adult medical examination without abnormal findings: Secondary | ICD-10-CM

## 2021-03-02 DIAGNOSIS — D500 Iron deficiency anemia secondary to blood loss (chronic): Secondary | ICD-10-CM

## 2021-03-02 NOTE — Progress Notes (Signed)
Patient ID: Barbara Valdez, female   DOB: February 07, 1975, 46 y.o.   MRN: [id number] Virtual Visit via Telephone Note  I connected with Barbara Valdez on 03/02/21 at  3:30 PM EST by telephone and verified that I am speaking with the correct person using two identifiers.  Location: Patient: home Provider: [facility] office   I discussed the limitations, risks, security and privacy concerns of performing an evaluation and management service by telephone and the availability of in person appointments. I also discussed with the patient that there may be a patient responsible charge related to this service. The patient expressed understanding and agreed to proceed.   History of Present Illness: patient was in the hospital 1/16-1/25/2023 and found to have adenocarcinoma and partial colectomy.  She is being followed by heme-onc.  She met with someone regarding ostomy supplies yesterday.  She just had blood work 02/28/2021 with oncology(see oncology note below after excerpts from discharge summary.  She is feeling good overall and all of her questions have been answered thus far.  No N/V.  No fever.     Discharge Diagnoses Principal Problem:   Colitis Active Problems:   Iron deficiency anemia   Colon cancer ([place])   Resolved Problems:   * No resolved hospital problems. [facility] Course   Small bowel obstruction. 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 Patient has prior history of recurrent colitis.  Presently complains of abdominal pain and small bowel obstruction. CEA was elevated. GI was consulted.  Underwent colonoscopy. General surgery was consulted.  Underwent partial colectomy and colostomy creation on 1/19. Biopsy came back positive for adenocarcinoma. Hematology was consulted.  Patient will be following up with them outpatient in February. Drain removed on 1/24. Patient was given IV iron. Tolerated oral diet.   Microcytic iron deficiency anemia. Received  Ferrlecit last admission with nausea and vomiting. Was treated with Feraheme. Outpatient follow-up with oncology.   Hypokalemia. Hypomagnesemia. Hypophosphatemia. Replaced.     From Oncology appt 2/6: ASSESSMENT & PLAN:  Barbara Valdez is a 46 y.o. postmenopausal female with   1. 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 Returned with worsening abdominal pain on 02/07/21; CT AP showed severe wall thickening and hyperenhancement involving distal sigmoid colon and rectum, 3 cm abscess at rectosigmoid junction, and one enlarged left external iliac lymph node. She was admitted and underwent colonoscopy on 02/09/21 by Dr. Alessandra Bevels showing partially obstructing tumor in distal sigmoid colon and inflamed mucosa in recto-sigmoid colon. Pathology showed adenomatous lesion with extensive high-grade dysplasia and nonspecific colitis. -She underwent emergent partial colectomy on 02/10/21 under Dr. Hassell Done. 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 Margins and lymph nodes negative. --I reviewed the work up with the patient and her daughter today. Given her stage IIB cancer and the increased risk of recurrence, I do recommend adjuvant chemotherapy.  I discussed the option of FOLFOX and CapeOx.  Because she is very young and overall healthy, I would advise CAPOX to further reduce her risk. I discussed this would consist of 4 cycles of CAPOX (IV oxaliplatin and oral Xeloda) q3weeks, then proceed with Xeloda alone for an additional 3 months, due to T4a disease and possible micro-perforation  --Chemotherapy consent: Side effects including but does not not limited to, fatigue, nausea, vomiting, diarrhea, hair loss, neuropathy, fluid retention, renal and kidney dysfunction, neutropenic fever, needed for blood transfusion, bleeding, were  discussed with patient in great detail. She agrees to  proceed. -The goal of chemotherapy is curative -we will plan to begin CAPOX on 03/14/21.   2.  Iron deficiency anemia -likely secondary to #1 -she received B12 and IV Ferrlecit on 11/29/20 and IV Feraheme on 02/15/21. -we will plan to give her another dose of IV iron next week.   3. Genetics -given her young age and family history, she qualifies for genetic testing. I will refer her today.     PLAN:  -chemo education -referral to genetics -IV iron next week (per her insurance preference)  -lab, f/u, and plan to start CAPOX on 2/20 -phone visit one week after first cycle     Observations/Objective: NAD.  A&Ox3   Assessment and Plan: 1. Cancer of sigmoid colon ([place]) Followed by heme/onc-will be starting  treatment 03/14/2021  2. Colitis with rectal bleeding resolved  3. Iron deficiency anemia due to chronic blood loss Will be followed by heme onc  4. Hospital discharge follow-up Doing ok-all follow ups scheduled and patient is aware  5. Encounter for medical examination to establish care    Follow Up Instructions: Assign PCP in our office in about 2-3 months   I discussed the assessment and treatment plan with the patient. The patient was provided an opportunity to ask questions and all were answered. The patient agreed with the plan and demonstrated an understanding of the instructions.   The patient was advised to call back or seek an in-person evaluation if the symptoms worsen or if the condition fails to improve as anticipated.  I provided 17 minutes of non-face-to-face time during this encounter.   Freeman Caldron, PA-C

## 2021-03-04 NOTE — Telephone Encounter (Signed)
Received notification from [employer] that prior authorization is not required.  Patient's copay is [facility] [place] Phone [phone] Fax [phone] 03/04/2021 8:29 AM

## 2021-03-07 ENCOUNTER — Telehealth: Payer: Self-pay

## 2021-03-07 NOTE — Telephone Encounter (Signed)
-----   Message from Argentina Donovan, [place] sent at 03/02/2021  2:42 PM EST ----- Assign PCP in our office in about 2-3 months

## 2021-03-07 NOTE — Progress Notes (Signed)
Pharmacist Chemotherapy Monitoring - Initial Assessment    Anticipated start date: 03/14/21   The following has been reviewed per standard work regarding the patient's treatment regimen: The patient's diagnosis, treatment plan and drug doses, and organ/hematologic function Lab orders and baseline tests specific to treatment regimen  The treatment plan start date, drug sequencing, and pre-medications Prior authorization status  Patient's documented medication list, including drug-drug interaction screen and prescriptions for anti-emetics and supportive care specific to the treatment regimen The drug concentrations, fluid compatibility, administration routes, and timing of the medications to be used The patient's access for treatment and lifetime cumulative dose history, if applicable  The patient's medication allergies and previous infusion related reactions, if applicable   Changes made to treatment plan:  N/A  Follow up needed:  Pending authorization for treatment  Hgb 7.8  Benn Moulder, PharmD Pharmacy Resident  03/07/2021 12:33 PM

## 2021-03-08 ENCOUNTER — Encounter: Payer: Self-pay | Admitting: Hematology

## 2021-03-08 NOTE — Progress Notes (Signed)
Received referral from pharmacy regarding patient needing assistance with medication.  Called patient to introduce myself as Arboriculturist and to offer available resources. Discussed one-time $1000 Radio broadcast assistant to assist with personal expenses while going through treatment. Advised what is needed to apply. Patient will email information to me.  Patient also states she has cold symptoms and may have to have chemo class for tomorrow rescheduled but someone will call and let her know.  Sent her an email with my contact information on it for her to reply and for any additional financial questions or concerns.  Patient was confused about prescriptions sent to pharmacy. Staff message sent to Santiago Glad RN to follow up with patient.

## 2021-03-09 ENCOUNTER — Encounter: Payer: Self-pay | Admitting: Hematology

## 2021-03-09 ENCOUNTER — Ambulatory Visit: Payer: 59

## 2021-03-09 ENCOUNTER — Inpatient Hospital Stay: Payer: 59

## 2021-03-09 ENCOUNTER — Other Ambulatory Visit: Payer: 59

## 2021-03-09 ENCOUNTER — Ambulatory Visit: Payer: 59 | Admitting: Hematology

## 2021-03-09 ENCOUNTER — Other Ambulatory Visit (HOSPITAL_COMMUNITY): Payer: Self-pay

## 2021-03-09 ENCOUNTER — Other Ambulatory Visit: Payer: Self-pay

## 2021-03-09 DIAGNOSIS — C187 Malignant neoplasm of sigmoid colon: Secondary | ICD-10-CM

## 2021-03-09 MED ORDER — PROCHLORPERAZINE MALEATE 10 MG PO TABS: 10.0000 mg | ORAL_TABLET | Freq: Four times a day (QID) | ORAL | 1 refills | Status: DC | PRN

## 2021-03-09 MED ORDER — ONDANSETRON HCL 8 MG PO TABS: 8.0000 mg | ORAL_TABLET | Freq: Two times a day (BID) | ORAL | 1 refills | Status: DC | PRN

## 2021-03-09 NOTE — Telephone Encounter (Signed)
Spoke with pharmacy and cancelled zofran and compazine prescriptions as new ones sent to her current pharmacy [facility] in [place].

## 2021-03-09 NOTE — Progress Notes (Signed)
Attempted to call patient to answer questions she may have about needed documentation for grant and to inquire if she was coming for appointment today. No answer and voicemail not set up.

## 2021-03-10 ENCOUNTER — Encounter: Payer: Self-pay | Admitting: Hematology

## 2021-03-10 NOTE — Progress Notes (Signed)
Patient emailed income documents for grant.  Patient approved for one-time $1000 [employer] grant to assist with medications and other personal expenses while going through treatment. She will sign her approval letter on 03/14/21 at next visit. Advised she will be able to pick up her prescriptions from Outpatient pharmacy using grant approval. She verbalized understanding. She will receive a copy of approval letter and expense sheet as well along with my card for any additional financial questions or concerns.  She had a concern about ostomy supplies. Staff message sent to LCSW in notes on follow up.

## 2021-03-11 ENCOUNTER — Telehealth: Payer: Self-pay

## 2021-03-11 MED FILL — Dexamethasone Sodium Phosphate Inj 100 MG/10ML: INTRAMUSCULAR | Qty: 1 | Status: AC

## 2021-03-11 NOTE — Telephone Encounter (Signed)
This nurse attempted to reach this patient related to request for a letter for her job about her medical condition.  Unable to leave message for patient.  Will attempt to reach patient again.  No further concerns.

## 2021-03-14 ENCOUNTER — Inpatient Hospital Stay: Payer: 59

## 2021-03-14 ENCOUNTER — Encounter: Payer: Self-pay | Admitting: Hematology

## 2021-03-14 ENCOUNTER — Inpatient Hospital Stay (HOSPITAL_BASED_OUTPATIENT_CLINIC_OR_DEPARTMENT_OTHER): Payer: 59 | Admitting: Hematology

## 2021-03-14 ENCOUNTER — Other Ambulatory Visit: Payer: Self-pay

## 2021-03-14 ENCOUNTER — Other Ambulatory Visit (HOSPITAL_COMMUNITY): Payer: Self-pay

## 2021-03-14 ENCOUNTER — Ambulatory Visit: Payer: 59

## 2021-03-14 VITALS — BP 112/68 | HR 81 | Temp 98.8°F | Resp 18 | Ht 66.0 in | Wt 126.3 lb

## 2021-03-14 VITALS — BP 113/76 | HR 65 | Temp 98.3°F

## 2021-03-14 DIAGNOSIS — Z5111 Encounter for antineoplastic chemotherapy: Secondary | ICD-10-CM | POA: Diagnosis not present

## 2021-03-14 DIAGNOSIS — D500 Iron deficiency anemia secondary to blood loss (chronic): Secondary | ICD-10-CM

## 2021-03-14 DIAGNOSIS — C187 Malignant neoplasm of sigmoid colon: Secondary | ICD-10-CM

## 2021-03-14 LAB — COMPREHENSIVE METABOLIC PANEL
ALT: 9 U/L (ref 0–44)
AST: 15 U/L (ref 15–41)
Albumin: 4.2 g/dL (ref 3.5–5.0)
Alkaline Phosphatase: 60 U/L (ref 38–126)
Anion gap: 8 (ref 5–15)
BUN: 8 mg/dL (ref 6–20)
CO2: 29 mmol/L (ref 22–32)
Calcium: 10 mg/dL (ref 8.9–10.3)
Chloride: 106 mmol/L (ref 98–111)
Creatinine, Ser: 0.73 mg/dL (ref 0.44–1.00)
GFR, Estimated: >60 mL/min (ref >60)
Glucose, Bld: 87 mg/dL (ref 70–99)
Potassium: 3.8 mmol/L (ref 3.5–5.1)
Sodium: 143 mmol/L (ref 135–145)
Total Bilirubin: 0.3 mg/dL (ref 0.3–1.2)
Total Protein: 8 g/dL (ref 6.5–8.1)

## 2021-03-14 LAB — CBC WITH DIFFERENTIAL/PLATELET
Abs Immature Granulocytes: 0.03 10*3/uL (ref 0.00–0.07)
Basophils Absolute: 0 10*3/uL (ref 0.0–0.1)
Basophils Relative: 1 %
Eosinophils Absolute: 0 10*3/uL (ref 0.0–0.5)
Eosinophils Relative: 1 %
HCT: 37.1 % (ref 36.0–46.0)
Hemoglobin: 11.2 g/dL — ABNORMAL LOW (ref 12.0–15.0)
Immature Granulocytes: 1 %
Lymphocytes Relative: 43 %
Lymphs Abs: 1.6 10*3/uL (ref 0.7–4.0)
MCH: 24.3 pg — ABNORMAL LOW (ref 26.0–34.0)
MCHC: 30.2 g/dL (ref 30.0–36.0)
MCV: 80.5 fL (ref 80.0–100.0)
Monocytes Absolute: 0.3 10*3/uL (ref 0.1–1.0)
Monocytes Relative: 10 %
Neutro Abs: 1.6 10*3/uL — ABNORMAL LOW (ref 1.7–7.7)
Neutrophils Relative %: 44 %
Platelets: 401 10*3/uL — ABNORMAL HIGH (ref 150–400)
RBC: 4.61 MIL/uL (ref 3.87–5.11)
RDW: 17.2 % — ABNORMAL HIGH (ref 11.5–15.5)
WBC: 3.6 10*3/uL — ABNORMAL LOW (ref 4.0–10.5)
nRBC: 0 % (ref 0.0–0.2)

## 2021-03-14 LAB — FERRITIN: Ferritin: 213 ng/mL (ref 11–307)

## 2021-03-14 LAB — CEA (IN HOUSE-CHCC): CEA (CHCC-In House): 1.02 ng/mL (ref 0.00–5.00)

## 2021-03-14 MED ORDER — SODIUM CHLORIDE 0.9 % IV SOLN
10.0000 mg | Freq: Once | INTRAVENOUS | Status: AC
  Administered 2021-03-14: 10 mg via INTRAVENOUS
  Filled 2021-03-14: qty 10

## 2021-03-14 MED ORDER — OXALIPLATIN CHEMO INJECTION 100 MG/20ML
130.0000 mg/m2 | Freq: Once | INTRAVENOUS | Status: AC
  Administered 2021-03-14: 210 mg via INTRAVENOUS
  Filled 2021-03-14: qty 40

## 2021-03-14 MED ORDER — LORATADINE 10 MG PO TABS
10.0000 mg | ORAL_TABLET | Freq: Once | ORAL | Status: AC
  Administered 2021-03-14: 10 mg via ORAL
  Filled 2021-03-14: qty 1

## 2021-03-14 MED ORDER — LORATADINE 10 MG PO TABS: 10.0000 mg | ORAL_TABLET | Freq: Every day | ORAL | Status: DC

## 2021-03-14 MED ORDER — ONDANSETRON HCL 8 MG PO TABS: 8.0000 mg | ORAL_TABLET | Freq: Two times a day (BID) | ORAL | 1 refills | Status: DC | PRN

## 2021-03-14 MED ORDER — SODIUM CHLORIDE 0.9 % IV SOLN: Freq: Once | INTRAVENOUS | Status: AC

## 2021-03-14 MED ORDER — PROCHLORPERAZINE MALEATE 10 MG PO TABS: 10.0000 mg | ORAL_TABLET | Freq: Four times a day (QID) | ORAL | 1 refills | Status: DC | PRN

## 2021-03-14 MED ORDER — DEXTROSE 5 % IV SOLN: Freq: Once | INTRAVENOUS | Status: AC

## 2021-03-14 MED ORDER — PALONOSETRON HCL INJECTION 0.25 MG/5ML
0.2500 mg | Freq: Once | INTRAVENOUS | Status: AC
  Administered 2021-03-14: 0.25 mg via INTRAVENOUS
  Filled 2021-03-14: qty 5

## 2021-03-14 MED ORDER — SODIUM CHLORIDE 0.9 % IV SOLN
300.0000 mg | Freq: Once | INTRAVENOUS | Status: AC
  Administered 2021-03-14: 300 mg via INTRAVENOUS
  Filled 2021-03-14: qty 300

## 2021-03-14 NOTE — Progress Notes (Signed)
Patient signature for [employer] grant was obtained today and scanned.  Her one-time $1000 [employer] grant is approved and active for medications and other personal expenses while going through treatment.  She has my card for any additional financial questions or concerns.

## 2021-03-14 NOTE — Telephone Encounter (Signed)
Oral Chemotherapy Pharmacist Encounter  I met with patient in infusion for overview of: Xeloda (capecitabine) for the treatment of stage IIB colon cancer in conjunction with oxaliplatin, planned duration 4 cycles, followed by Xeloda monotherapy for ~3 months.  Counseled patient on administration, dosing, side effects, monitoring, drug-food interactions, safe handling, storage, and disposal.  Patient will take Xeloda 539m tablets, 2 tablets (10049m by mouth in AM and 3 tabs (150055mby mouth in PM, within 30 minutes of finishing meals, on days 1-14 of each 21 day cycle.   Oxaliplatin will be infused on day 1 of each 21 day cycle.  Xeloda and oxaliplatin start date: 03/14/21  Adverse effects include but are not limited to: fatigue, decreased blood counts, GI upset, diarrhea, mouth sores, and hand-foot syndrome.  Patient has anti-emetic on hand and knows to take it if nausea develops.    Patient will obtain anti diarrheal and alert the office of 4 or more loose stools above baseline.  Reviewed with patient importance of keeping a medication schedule and plan for any missed doses. No barriers to medication adherence identified.  Medication reconciliation performed and medication/allergy list updated. Patient states she no longer takes pantoprazole - medication removed from medication list.   Patient approved for [employer] will obtain Xeloda from the [facility] $0. Ms. HerSchambergerll pick this up from [facility] 03/14/21 after her appt. and will take first dose of Xeloda tonight.   Patient informed the pharmacy will reach out 5-7 days prior to needing next fill of Xeloda to coordinate continued medication acquisition to prevent break in therapy.  All questions answered.  Ms. HerElmendorficed understanding and appreciation.   Medication education handout placed in mail for patient. Patient knows to call the office with questions or concerns.  Oral Chemotherapy Clinic phone number provided to patient.   RebLeron CroakharmD, BCPS Hematology/Oncology Clinical Pharmacist [facility] [phone]/2023 11:11 AM

## 2021-03-14 NOTE — Progress Notes (Signed)
Nutrition Assessment   Reason for Assessment:   Patient identified on Malnutrition Screening report for weight loss and poor appetite  ASSESSMENT 46 year old female with adenocarcinoma of sigmoid colon.  S/p partial colectomy with colostomy on 1/19.  Past medical history of anxiety, Fe deficiency anemia.  Patient starting oxaliplatin today.  Met with patient during infusion.  Patient reports that her appetite has improved following surgery.  Was not eating well prior to surgery due to abdominal pain.  Reports that she has been eating cheerios, biscuit, soups, grits fruit for breakfast.  Rice and chicken for lunch and does not eat much supper.  She has been drinking ensure clear (1/day) and muscle milk 2 a day.  Reports that she tried ensure but did not really like.  Avoids milk products due to stomach upset.  Says that she is constipated.  MD wants her to take miralax BID.  She is taking metamucil for fiber.     Medications:  Protonix, compazine, zofran, miralax, MVI   Labs: K 3.3   Anthropometrics:   Height: 66 inches Weight: 126 lb  UBW: 160-170s per patient.  Noted 04/2020 163 lb BMI: 20  23% weight loss in the last year, significant   Estimated Energy Needs  Kcals: 2200-2500 Protein: 110-125 g Fluid: 2.2 L   NUTRITION DIAGNOSIS: Inadequate oral intake related to cancer as evidenced by 23% weight loss in the last year and poor po intake   INTERVENTION:  Reviewed other "juice" type shakes with patient as options (boost breeze and boost soothe). Also discussed diary free shakes of orgain, fairlife.   Discussed ways to add calories and protein in diet.  Encouraged increased fluid to help with constipation and bowel regimen.   Contact information given   MONITORING, EVALUATION, GOAL: weight trends, intake   Next Visit: Monday, March 13 during infusion  Halee Glynn B. Zenia Resides, [place], [place] Registered Dietitian [phone] (mobile)

## 2021-03-14 NOTE — Patient Instructions (Addendum)
Iron Sucrose Injection What is this medication? IRON SUCROSE (EYE ern SOO krose) treats low levels of iron (iron deficiency anemia) in people with kidney disease. Iron is a mineral that plays an important role in making red blood cells, which carry oxygen from your lungs to the rest of your body. This medicine may be used for other purposes; ask your health care provider or pharmacist if you have questions. COMMON BRAND NAME(S): Venofer What should I tell my care team before I take this medication? They need to know if you have any of these conditions: Anemia not caused by low iron levels Heart disease High levels of iron in the blood Kidney disease Liver disease An unusual or allergic reaction to iron, other medications, foods, dyes, or preservatives Pregnant or trying to get pregnant Breast-feeding How should I use this medication? This medication is for infusion into a vein. It is given in a hospital or clinic setting. Talk to your care team about the use of this medication in children. While this medication may be prescribed for children as young as 2 years for selected conditions, precautions do apply. Overdosage: If you think you have taken too much of this medicine contact a poison control center or emergency room at once. NOTE: This medicine is only for you. Do not share this medicine with others. What if I miss a dose? It is important not to miss your dose. Call your care team if you are unable to keep an appointment. What may interact with this medication? Do not take this medication with any of the following: Deferoxamine Dimercaprol Other iron products This medication may also interact with the following: Chloramphenicol Deferasirox This list may not describe all possible interactions. Give your health care provider a list of all the medicines, herbs, non-prescription drugs, or dietary supplements you use. Also tell them if you smoke, drink alcohol, or use illegal drugs.  Some items may interact with your medicine. What should I watch for while using this medication? Visit your care team regularly. Tell your care team if your symptoms do not start to get better or if they get worse. You may need blood work done while you are taking this medication. You may need to follow a special diet. Talk to your care team. Foods that contain iron include: whole grains/cereals, dried fruits, beans, or peas, leafy green vegetables, and organ meats (liver, kidney). What side effects may I notice from receiving this medication? Side effects that you should report to your care team as soon as possible: Allergic reactions--skin rash, itching, hives, swelling of the face, lips, tongue, or throat Low blood pressure--dizziness, feeling faint or lightheaded, blurry vision Shortness of breath Side effects that usually do not require medical attention (report to your care team if they continue or are bothersome): Flushing Headache Joint pain Muscle pain Nausea Pain, redness, or irritation at injection site This list may not describe all possible side effects. Call your doctor for medical advice about side effects. You may report side effects to FDA at 1-800-FDA-1088. Where should I keep my medication? This medication is given in a hospital or clinic and will not be stored at home. NOTE: This sheet is a summary. It may not cover all possible information. If you have questions about this medicine, talk to your doctor, pharmacist, or health care provider.  2022 [employer]/Gold Standard (2020-06-04 00:00:00)  Barbara Valdez  Discharge Instructions: Thank you for choosing [facility] to provide your oncology and  hematology care.   If you have a lab appointment with the [facility], please go directly to the [facility] and check in at the registration area.   Wear comfortable clothing and clothing appropriate for easy access to any Portacath  or PICC line.   We strive to give you quality time with your provider. You may need to reschedule your appointment if you arrive late (15 or more minutes).  Arriving late affects you and other patients whose appointments are after yours.  Also, if you miss three or more appointments without notifying the office, you may be dismissed from the clinic at the providers discretion.      For prescription refill requests, have your pharmacy contact our office and allow 72 hours for refills to be completed.    Today you received the following chemotherapy and/or immunotherapy agent: Oxaliplatin   To help prevent nausea and vomiting after your treatment, we encourage you to take your nausea medication as directed.  BELOW ARE SYMPTOMS THAT SHOULD BE REPORTED IMMEDIATELY: *FEVER GREATER THAN 100.4 F (38 C) OR HIGHER *CHILLS OR SWEATING *NAUSEA AND VOMITING THAT IS NOT CONTROLLED WITH YOUR NAUSEA MEDICATION *UNUSUAL SHORTNESS OF BREATH *UNUSUAL BRUISING OR BLEEDING *URINARY PROBLEMS (pain or burning when urinating, or frequent urination) *BOWEL PROBLEMS (unusual diarrhea, constipation, pain near the anus) TENDERNESS IN MOUTH AND THROAT WITH OR WITHOUT PRESENCE OF ULCERS (sore throat, sores in mouth, or a toothache) UNUSUAL RASH, SWELLING OR PAIN  UNUSUAL VAGINAL DISCHARGE OR ITCHING   Items with * indicate a potential emergency and should be followed up as soon as possible or go to the Emergency Department if any problems should occur.  Please show the CHEMOTHERAPY ALERT CARD or IMMUNOTHERAPY ALERT CARD at check-in to the Emergency Department and triage nurse.  Should you have questions after your visit or need to cancel or reschedule your appointment, please contact [facility]  Dept: [phone]  and follow the prompts.  Office hours are 8:00 a.m. to 4:30 p.m. Monday - Friday. Please note that voicemails left after 4:00 p.m. may not be returned until the following  business day.  We are closed weekends and major holidays. You have access to a nurse at all times for urgent questions. Please call the main number to the clinic Dept: [phone] and follow the prompts.   For any non-urgent questions, you may also contact your provider using MyChart. We now offer e-Visits for anyone 43 and older to request care online for non-urgent symptoms. For details visit [email or web address].   Also download the MyChart app! Go to the app store, search "MyChart", open the app, select [facility], and log in with your MyChart username and password.  Due to Covid, a mask is required upon entering the hospital/clinic. If you do not have a mask, one will be given to you upon arrival. For doctor visits, patients may have 1 support person aged 27 or older with them. For treatment visits, patients cannot have anyone with them due to current Covid guidelines and our immunocompromised population.  Oxaliplatin Injection What is this medication? OXALIPLATIN (ox AL i PLA tin) is a chemotherapy drug. It targets fast dividing cells, like cancer cells, and causes these cells to die. This medicine is used to treat cancers of the colon and rectum, and many other cancers. This medicine may be used for other purposes; ask your health care provider or pharmacist if you have questions. COMMON BRAND NAME(S): Eloxatin What should I tell my  care team before I take this medication? They need to know if you have any of these conditions: heart disease history of irregular heartbeat liver disease low blood counts, like white cells, platelets, or red blood cells lung or breathing disease, like asthma take medicines that treat or prevent blood clots tingling of the fingers or toes, or other nerve disorder an unusual or allergic reaction to oxaliplatin, other chemotherapy, other medicines, foods, dyes, or preservatives pregnant or trying to get pregnant breast-feeding How should I use this  medication? This drug is given as an infusion into a vein. It is administered in a hospital or clinic by a specially trained health care professional. Talk to your pediatrician regarding the use of this medicine in children. Special care may be needed. Overdosage: If you think you have taken too much of this medicine contact a poison control center or emergency room at once. NOTE: This medicine is only for you. Do not share this medicine with others. What if I miss a dose? It is important not to miss a dose. Call your doctor or health care professional if you are unable to keep an appointment. What may interact with this medication? Do not take this medicine with any of the following medications: cisapride dronedarone pimozide thioridazine This medicine may also interact with the following medications: aspirin and aspirin-like medicines certain medicines that treat or prevent blood clots like warfarin, apixaban, dabigatran, and rivaroxaban cisplatin cyclosporine diuretics medicines for infection like acyclovir, adefovir, amphotericin B, bacitracin, cidofovir, foscarnet, ganciclovir, gentamicin, pentamidine, vancomycin NSAIDs, medicines for pain and inflammation, like ibuprofen or naproxen other medicines that prolong the QT interval (an abnormal heart rhythm) pamidronate zoledronic acid This list may not describe all possible interactions. Give your health care provider a list of all the medicines, herbs, non-prescription drugs, or dietary supplements you use. Also tell them if you smoke, drink alcohol, or use illegal drugs. Some items may interact with your medicine. What should I watch for while using this medication? Your condition will be monitored carefully while you are receiving this medicine. You may need blood work done while you are taking this medicine. This medicine may make you feel generally unwell. This is not uncommon as chemotherapy can affect healthy cells as well as  cancer cells. Report any side effects. Continue your course of treatment even though you feel ill unless your healthcare professional tells you to stop. This medicine can make you more sensitive to cold. Do not drink cold drinks or use ice. Cover exposed skin before coming in contact with cold temperatures or cold objects. When out in cold weather wear warm clothing and cover your mouth and nose to warm the air that goes into your lungs. Tell your doctor if you get sensitive to the cold. Do not become pregnant while taking this medicine or for 9 months after stopping it. Women should inform their health care professional if they wish to become pregnant or think they might be pregnant. Men should not father a child while taking this medicine and for 6 months after stopping it. There is potential for serious side effects to an unborn child. Talk to your health care professional for more information. Do not breast-feed a child while taking this medicine or for 3 months after stopping it. This medicine has caused ovarian failure in some women. This medicine may make it more difficult to get pregnant. Talk to your health care professional if you are concerned about your fertility. This medicine has caused decreased sperm  counts in some men. This may make it more difficult to father a child. Talk to your health care professional if you are concerned about your fertility. This medicine may increase your risk of getting an infection. Call your health care professional for advice if you get a fever, chills, or sore throat, or other symptoms of a cold or flu. Do not treat yourself. Try to avoid being around people who are sick. Avoid taking medicines that contain aspirin, acetaminophen, ibuprofen, naproxen, or ketoprofen unless instructed by your health care professional. These medicines may hide a fever. Be careful brushing or flossing your teeth or using a toothpick because you may get an infection or bleed more  easily. If you have any dental work done, tell your dentist you are receiving this medicine. What side effects may I notice from receiving this medication? Side effects that you should report to your doctor or health care professional as soon as possible: allergic reactions like skin rash, itching or hives, swelling of the face, lips, or tongue breathing problems cough low blood counts - this medicine may decrease the number of white blood cells, red blood cells, and platelets. You may be at increased risk for infections and bleeding nausea, vomiting pain, redness, or irritation at site where injected pain, tingling, numbness in the hands or feet signs and symptoms of bleeding such as bloody or black, tarry stools; red or dark brown urine; spitting up blood or brown material that looks like coffee grounds; red spots on the skin; unusual bruising or bleeding from the eyes, gums, or nose signs and symptoms of a dangerous change in heartbeat or heart rhythm like chest pain; dizziness; fast, irregular heartbeat; palpitations; feeling faint or lightheaded; falls signs and symptoms of infection like fever; chills; cough; sore throat; pain or trouble passing urine signs and symptoms of liver injury like dark yellow or brown urine; general ill feeling or flu-like symptoms; light-colored stools; loss of appetite; nausea; right upper belly pain; unusually weak or tired; yellowing of the eyes or skin signs and symptoms of low red blood cells or anemia such as unusually weak or tired; feeling faint or lightheaded; falls signs and symptoms of muscle injury like dark urine; trouble passing urine or change in the amount of urine; unusually weak or tired; muscle pain; back pain Side effects that usually do not require medical attention (report to your doctor or health care professional if they continue or are bothersome): changes in taste diarrhea gas hair loss loss of appetite mouth sores This list may not  describe all possible side effects. Call your doctor for medical advice about side effects. You may report side effects to FDA at 1-800-FDA-1088. Where should I keep my medication? This drug is given in a hospital or clinic and will not be stored at home. NOTE: This sheet is a summary. It may not cover all possible information. If you have questions about this medicine, talk to your doctor, pharmacist, or health care provider.  2022 [employer]vier/Gold Standard (2020-09-28 00:00:00)

## 2021-03-14 NOTE — Progress Notes (Signed)
[place]   [phone] [phone]   Clinic Follow up Note   Patient Care Team: Pcp, No as PCP - General Johnathan Hausen, MD as Consulting Physician (General Surgery) Otis Brace, MD as Consulting Physician (Gastroenterology) Truitt Merle, MD as Consulting Physician (Hematology)  Date of Service:  03/14/2021  CHIEF COMPLAINT: f/u of sigmoid colon cancer  CURRENT THERAPY:  Adjuvant CAPOX, starting 03/14/21  ASSESSMENT & PLAN:  Barbara Valdez is a 46 y.o. female with   1. 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 Returned with worsening abdominal pain on 02/07/21; CT AP showed severe wall thickening and hyperenhancement involving distal sigmoid colon and rectum, 3 cm abscess at rectosigmoid junction, and one enlarged left external iliac lymph node. She was admitted and underwent colonoscopy on 02/09/21 by Dr. Alessandra Bevels showing partially obstructing tumor in distal sigmoid colon and inflamed mucosa in recto-sigmoid colon. Pathology showed adenomatous lesion with extensive high-grade dysplasia and nonspecific colitis. -She underwent emergent partial colectomy on 02/10/21 under Dr. Hassell Done. 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 Margins and lymph nodes negative. -she is scheduled to begin CAPOX today, 03/14/21, to reduce her risk of recurrence.  I again reviewed the potential side effect from capecitabine and oxaliplatin, and management.  She voiced good understanding and agrees to start.  She has been approved for a grant to help her pay for Xeloda so she will pick this up today. -I will call her next week for toxicity check.  2. Constipation -secondary to surgery -she has tried to increase her fiber intake. She endorses taking miralax as needed. I advised her to take this consistently.   3.  Iron deficiency anemia -likely  secondary to #1 -she received B12 and IV Ferrlecit on 11/29/20 and IV Feraheme on 02/15/21. -improved, hgb 11.2 today (03/14/21)   4. Genetics -given her young age and family history, she qualifies for genetic testing. She is scheduled for counseling and testing on 03/28/21.     PLAN:  -proceed with first oxaliplatin today -start Xeloda tonight, 1500 mg in PM and 1000 mg in AM, for 14 days. -phone visit one week after first cycle -Lab, f/u and cycle 2 capecitabine in 3 weeks.   No problem-specific Assessment & Plan notes found for this encounter.   SUMMARY OF ONCOLOGIC HISTORY: Oncology History Overview Note   Cancer Staging  Cancer of sigmoid colon [facility]) Staging form: Colon and Rectum, AJCC 8th Edition - Pathologic stage from 02/10/2021: Stage IIB (pT4a, pN0, cM0) - Signed by Truitt Merle, MD on 02/28/2021    Cancer of sigmoid colon ([place])  11/27/2020 Imaging   CLINICAL DATA:  Abdominal pain that is began Monday. Patient reports seen scant amounts of dark blood in stool. Feels bloated.   EXAM: CT ABDOMEN AND PELVIS WITH CONTRAST  IMPRESSION: 1. Proctocolitis involving the mid and distal sigmoid colon and upper rectum with a 3.9 cm gas containing interloop abscess centered in the sigmoid mesocolon. 2. Severe fecal burden.  No findings of bowel obstruction. 3. An enlarged left common iliac lymph node is likely reactive. 4. A 2.7 cm hemangioma is noted in the right hepatic lobe. 5. Mild focal bronchiolitis in the right lower lobe.   02/07/2021 Imaging   CLINICAL DATA:  Abdominal pain, cramping, constipation   EXAM: CT ABDOMEN AND PELVIS WITH CONTRAST  IMPRESSION: 1. There is severe, circumferential wall thickening and mucosal hyperenhancement involving the  distal sigmoid colon and rectum to the anus, generally tethered appearance unchanged, involving at least 15-20 cm of distal sigmoid and rectum. 2. There is again seen an abscess at the medial aspect of the rectosigmoid junction  measuring approximately 3.0 x 1.6 cm. 3. Findings are generally consistent with nonspecific infectious, inflammatory, or ischemic colitis, including inflammatory bowel disease such as Crohn's disease or ulcerative colitis; however an underlying malignancy is very difficult to exclude, particularly given substantial wall thickening. 4. Large burden of stool throughout the extremely redundant proximal colon, likely reflecting some degree of obstipation. 5. Enlarged left external iliac lymph nodes measuring up to 1.1 x 0.9 cm fall, nonspecific, possibly reactive, however nodal metastatic disease is not excluded per above.   02/09/2021 Initial Biopsy   FINAL MICROSCOPIC DIAGNOSIS:   A. COLON MASS, SIGMOID, BIOPSY:  - Adenomatous lesion with extensive high-grade dysplasia, see comment   B. RECTAL, COLON, SIGMOID, BIOPSY:  - Mild acute/ subacute nonspecific colitis, see comment  - Negative for definite features of chronicity, granulomas or dysplasia   COMMENT:  A.  Evidence of invasive carcinoma is not seen in the submitted biopsies but the biopsy fragments appear superficial and a more severe deeper process cannot be ruled out.  B.  Histologic features in the rectosigmoid biopsies are not compatible with untreated inflammatory bowel disease.  Differential diagnosis can include infection, drug-effect and stercoral proctitis among other possibilities.    02/09/2021 Procedure   Colonoscopy, Dr. Alessandra Bevels  Impression: - Preparation of the colon was fair. - Likely malignant partially obstructing tumor in the distal sigmoid colon. Biopsied. Tattooed. - Congested, inflamed and thickened folds of the mucosa in the recto-sigmoid colon. Biopsied. Tattooed.   02/10/2021 Cancer Staging   Staging form: Colon and Rectum, AJCC 8th Edition - Pathologic stage from 02/10/2021: Stage IIB (pT4a, pN0, cM0) - Signed by Truitt Merle, MD on 02/28/2021 Stage prefix: Initial diagnosis Total positive nodes:  0 Histologic grading system: 4 grade system Histologic grade (G): G2 Residual tumor (R): R0 - None    02/10/2021 Definitive Surgery   FINAL MICROSCOPIC DIAGNOSIS:   A. COLON, SIGMOID:  - Invasive moderately differentiated adenocarcinoma.  - Twenty-seven lymph nodes, negative for metastatic carcinoma (0/27).  - See oncology table below.   B. COLON, DISTAL, SIGMOID:  - Segment of colon with serosal adhesions, acute inflammation, and tattoo pigment.  - Negative for malignancy.    02/11/2021 Initial Diagnosis   Cancer of sigmoid colon ([place])   03/14/2021 -  Chemotherapy   Patient is on Treatment Plan : COLORECTAL Xelox (Capeox) q21d        INTERVAL HISTORY:  Barbara Valdez is here for a follow up of colon cancer. She was last seen by me on 02/28/21 in consultation. She presents to the clinic accompanied by her mother. She reports she is doing well overall. She reports she is recovering from surgery, and her energy is about 80%. She reports she is having continued constipation issues; she endorses trying miralax and increased fiber (with Benefiber and increasing fiber in her diet).   All other systems were reviewed with the patient and are negative.  MEDICAL HISTORY:  Past Medical History:  Diagnosis Date   Anemia    Anxiety    Complication of anesthesia    took a long time to wake up after surgery    SURGICAL HISTORY: Past Surgical History:  Procedure Laterality Date   ABDOMINAL HYSTERECTOMY Bilateral 09/26/2016   Procedure: HYSTERECTOMY ABDOMINAL W/ BILATERAL SALPINGECTOMY;  Surgeon: Hulan Fray,  Wilhemina Cash, MD;  Location: [facility];  Service: Gynecology;  Laterality: Bilateral;   BIOPSY  02/09/2021   Procedure: BIOPSY;  Surgeon: Otis Brace, MD;  Location: [facility];  Service: Gastroenterology;;   BREAST BIOPSY Left    COLONOSCOPY N/A 02/09/2021   Procedure: COLONOSCOPY;  Surgeon: Otis Brace, MD;  Location: [facility];  Service: Gastroenterology;  Laterality: N/A;    LAPAROSCOPIC PARTIAL COLECTOMY N/A 02/10/2021   Procedure: ROBOTIC ASSISTED PARTIAL COLECTOMY WITH COLOSTOMY;  Surgeon: Johnathan Hausen, MD;  Location: [facility];  Service: General;  Laterality: N/A;   SUBMUCOSAL TATTOO INJECTION  02/09/2021   Procedure: SUBMUCOSAL TATTOO INJECTION;  Surgeon: Otis Brace, MD;  Location: [facility];  Service: Gastroenterology;;   TUBAL LIGATION      I have reviewed the social history and family history with the patient and they are unchanged from previous note.  ALLERGIES:  is allergic to ferrlecit [na ferric gluc cplx in sucrose].  MEDICATIONS:  Current Outpatient Medications  Medication Sig Dispense Refill   acetaminophen (TYLENOL) 500 MG tablet Take 2 tablets (1,000 mg total) by mouth every 6 (six) hours as needed for mild pain. 30 tablet 0   capecitabine (XELODA) 500 MG tablet Take 2 tabs in morning and 3 tabs in evening,every 12 hours, for 14 days, then off for 7 days. Take after meals. 70 tablet 0   methocarbamol (ROBAXIN) 750 MG tablet Take 1 tablet (750 mg total) by mouth every 8 (eight) hours as needed for muscle spasms. 30 tablet 0   Multiple Vitamin (MULTIVITAMIN WITH MINERALS) TABS tablet Take 1 tablet by mouth daily. 150 tablet 0   ondansetron (ZOFRAN) 8 MG tablet Take 1 tablet (8 mg total) by mouth 2 (two) times daily as needed for refractory nausea / vomiting. Start on day 3 after chemotherapy. 30 tablet 1   polyethylene glycol (MIRALAX / GLYCOLAX) 17 g packet Take 17 g by mouth daily. 14 each 0   prochlorperazine (COMPAZINE) 10 MG tablet Take 1 tablet (10 mg total) by mouth every 6 (six) hours as needed (Nausea or vomiting). 30 tablet 1   No current facility-administered medications for this visit.    PHYSICAL EXAMINATION: ECOG PERFORMANCE STATUS: 0 - Asymptomatic  Vitals:   03/14/21 1000  BP: 112/68  Pulse: 81  Resp: 18  Temp: 98.8 F (37.1 C)  SpO2: 100%   Wt Readings from Last 3 Encounters:  03/14/21 126 lb 4.8 oz (57.3 kg)   02/28/21 126 lb 1.6 oz (57.2 kg)  02/07/21 134 lb (60.8 kg)     GENERAL:alert, no distress and comfortable SKIN: skin color normal, no rashes or significant lesions EYES: normal, Conjunctiva are pink and non-injected, sclera clear  NEURO: alert & oriented x 3 with fluent speech  LABORATORY DATA:  I have reviewed the data as listed CBC Latest Ref Rng & Units 03/14/2021 02/16/2021 02/15/2021  WBC 4.0 - 10.5 K/uL 3.6(L) 7.4 6.3  Hemoglobin 12.0 - 15.0 g/dL 11.2(L) 7.8(L) 8.0(L)  Hematocrit 36.0 - 46.0 % 37.1 26.0(L) 26.5(L)  Platelets 150 - 400 K/uL 401(H) 446(H) 434(H)     CMP Latest Ref Rng & Units 03/14/2021 02/16/2021 02/15/2021  Glucose 70 - 99 mg/dL 87 105(H) 99  BUN 6 - 20 mg/dL 8 5(L) 6  Creatinine 0.44 - 1.00 mg/dL 0.73 0.66 0.51  Sodium 135 - 145 mmol/L 143 138 139  Potassium 3.5 - 5.1 mmol/L 3.8 3.3(L) 3.4(L)  Chloride 98 - 111 mmol/L 106 102 104  CO2 22 - 32 mmol/L  29 29 29   Calcium 8.9 - 10.3 mg/dL 10.0 8.4(L) 8.2(L)  Total Protein 6.5 - 8.1 g/dL 8.0 - 5.5(L)  Total Bilirubin 0.3 - 1.2 mg/dL 0.3 - 0.5  Alkaline Phos 38 - 126 U/L 60 - 55  AST 15 - 41 U/L 15 - 24  ALT 0 - 44 U/L 9 - 14      RADIOGRAPHIC STUDIES: I have personally reviewed the radiological images as listed and agreed with the findings in the report. No results found.    No orders of the defined types were placed in this encounter.  All questions were answered. The patient knows to call the clinic with any problems, questions or concerns. No barriers to learning was detected. The total time spent in the appointment was 30 minutes.     Truitt Merle, MD 03/14/2021   I, Wilburn Mylar, am acting as scribe for Truitt Merle, MD.   I have reviewed the above documentation for accuracy and completeness, and I agree with the above.

## 2021-03-15 ENCOUNTER — Telehealth: Payer: Self-pay

## 2021-03-15 NOTE — Telephone Encounter (Signed)
Ms Barbara Valdez states that she is eatin, drinking and urinating well. No N/V noted. She is using gloves to get items out of the refrigerator.  She has been experiencing intermittent tingling and numbness in her hands. She knows to call the office at [phone] if she has any questions or concerns.  Pt will pick up Education packet at genetic  appointment on 03-28-21 up front.  She had teaching over the phone.

## 2021-03-15 NOTE — Telephone Encounter (Signed)
-----   Message from Ara Kussmaul, RN sent at 03/14/2021  4:21 PM EST ----- Regarding: feng first time follow up Pt was a first time oxaliplatin on 03/14/21, tolerated well needs a call back.

## 2021-03-16 ENCOUNTER — Other Ambulatory Visit: Payer: Self-pay

## 2021-03-16 ENCOUNTER — Telehealth: Payer: Self-pay | Admitting: Hematology

## 2021-03-16 NOTE — Telephone Encounter (Signed)
Scheduled follow-up appointments per 2/20 los. Patient is aware.

## 2021-03-16 NOTE — Progress Notes (Signed)
The proposed treatment discussed in conference is for discussion purpose only and is not a binding recommendation.  The patients have not been physically examined, or presented with their treatment options.  Therefore, final treatment plans cannot be decided.  

## 2021-03-21 ENCOUNTER — Encounter: Payer: Self-pay | Admitting: Hematology

## 2021-03-21 ENCOUNTER — Other Ambulatory Visit (HOSPITAL_COMMUNITY): Payer: Self-pay

## 2021-03-21 ENCOUNTER — Inpatient Hospital Stay (HOSPITAL_BASED_OUTPATIENT_CLINIC_OR_DEPARTMENT_OTHER): Payer: 59 | Admitting: Hematology

## 2021-03-21 DIAGNOSIS — C187 Malignant neoplasm of sigmoid colon: Secondary | ICD-10-CM

## 2021-03-21 MED ORDER — CAPECITABINE 500 MG PO TABS
ORAL_TABLET | ORAL | 1 refills | Status: DC
  Filled 2021-03-21: qty 70, fill #0
  Filled 2021-03-28: qty 70, 21d supply, fill #0
  Filled 2021-04-14: qty 70, 21d supply, fill #1

## 2021-03-21 NOTE — Progress Notes (Signed)
[place]   Telephone:([phone] Fax:([phone]) [phone]   Clinic Follow up Note   Patient Care Team: Pcp, No as PCP - General Johnathan Hausen, MD as Consulting Physician (General Surgery) Otis Brace, MD as Consulting Physician (Gastroenterology) Truitt Merle, MD as Consulting Physician (Hematology)  Date of Service:  03/21/2021  I connected with Betsey Holiday on 03/21/2021 at  9:40 AM EST by telephone visit and verified that I am speaking with the correct person using two identifiers.  I discussed the limitations, risks, security and privacy concerns of performing an evaluation and management service by telephone and the availability of in person appointments. I also discussed with the patient that there may be a patient responsible charge related to this service. The patient expressed understanding and agreed to proceed.   Other persons participating in the visit and their role in the encounter:  none  Patient's location:  home Provider's location:  my office  CHIEF COMPLAINT: f/u of sigmoid colon cancer  CURRENT THERAPY:  Adjuvant CAPOX, starting 03/14/21  ASSESSMENT & PLAN:  Barbara Valdez is a 46 y.o. female with   1. 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 Returned with worsening abdominal pain on 02/07/21; CT AP showed severe wall thickening and hyperenhancement involving distal sigmoid colon and rectum, 3 cm abscess at rectosigmoid junction, and one enlarged left external iliac lymph node. She was admitted and underwent colonoscopy on 02/09/21 by Dr. Alessandra Bevels showing partially obstructing tumor in distal sigmoid colon and inflamed mucosa in recto-sigmoid colon. Pathology showed adenomatous lesion with extensive high-grade dysplasia and nonspecific colitis. -She underwent emergent partial colectomy on 02/10/21 under Dr. Hassell Done. Pathology showed 9.5 cm invasive moderately differentiated  adenocarcinoma to sigmoid colon. Margins and lymph nodes negative. -she began CAPOX on 03/14/21 to reduce her risk of recurrence. She tolerated very well with expected cold sensitivity. She does note she had a burning sensation in her arm from the infusion that resolved after 2-3 days.   2. Constipation -secondary to surgery -she has tried to increase her fiber intake. She endorses taking miralax as needed. I advised her to take this consistently.   3.  Iron deficiency anemia -likely secondary to #1 -she received B12 and IV Ferrlecit on 11/29/20 and IV Feraheme on 02/15/21. -improved, hgb 11.2 on 03/14/21   4. Genetics -given her young age and family history, she qualifies for genetic testing. She is scheduled for counseling and testing on 03/28/21.     PLAN:  -continue Xeloda, 1500 mg in PM and 1000 mg in AM, for 14 days. She is tolerating well  -Lab, f/u and capecitabine in 2 and 5 weeks as scheduled   No problem-specific Assessment & Plan notes found for this encounter.   SUMMARY OF ONCOLOGIC HISTORY: Oncology History Overview Note   Cancer Staging  Cancer of sigmoid colon [facility]) Staging form: Colon and Rectum, AJCC 8th Edition - Pathologic stage from 02/10/2021: Stage IIB (pT4a, pN0, cM0) - Signed by Truitt Merle, MD on 02/28/2021    Cancer of sigmoid colon ([place])  11/27/2020 Imaging   CLINICAL DATA:  Abdominal pain that is began Monday. Patient reports seen scant amounts of dark blood in stool. Feels bloated.   EXAM: CT ABDOMEN AND PELVIS WITH CONTRAST  IMPRESSION: 1. Proctocolitis involving the mid and distal sigmoid colon and upper rectum with a 3.9 cm gas containing interloop abscess centered in the sigmoid mesocolon. 2. Severe fecal burden.  No findings of bowel obstruction. 3. An enlarged left common iliac lymph node is likely reactive. 4. A 2.7 cm hemangioma is noted in the right hepatic lobe. 5. Mild focal bronchiolitis in the right lower lobe.   02/07/2021 Imaging    CLINICAL DATA:  Abdominal pain, cramping, constipation   EXAM: CT ABDOMEN AND PELVIS WITH CONTRAST  IMPRESSION: 1. There is severe, circumferential wall thickening and mucosal hyperenhancement involving the distal sigmoid colon and rectum to the anus, generally tethered appearance unchanged, involving at least 15-20 cm of distal sigmoid and rectum. 2. There is again seen an abscess at the medial aspect of the rectosigmoid junction measuring approximately 3.0 x 1.6 cm. 3. Findings are generally consistent with nonspecific infectious, inflammatory, or ischemic colitis, including inflammatory bowel disease such as Crohn's disease or ulcerative colitis; however an underlying malignancy is very difficult to exclude, particularly given substantial wall thickening. 4. Large burden of stool throughout the extremely redundant proximal colon, likely reflecting some degree of obstipation. 5. Enlarged left external iliac lymph nodes measuring up to 1.1 x 0.9 cm fall, nonspecific, possibly reactive, however nodal metastatic disease is not excluded per above.   02/09/2021 Initial Biopsy   FINAL MICROSCOPIC DIAGNOSIS:   A. COLON MASS, SIGMOID, BIOPSY:  - Adenomatous lesion with extensive high-grade dysplasia, see comment   B. RECTAL, COLON, SIGMOID, BIOPSY:  - Mild acute/ subacute nonspecific colitis, see comment  - Negative for definite features of chronicity, granulomas or dysplasia   COMMENT:  A.  Evidence of invasive carcinoma is not seen in the submitted biopsies but the biopsy fragments appear superficial and a more severe deeper process cannot be ruled out.  B.  Histologic features in the rectosigmoid biopsies are not compatible with untreated inflammatory bowel disease.  Differential diagnosis can include infection, drug-effect and stercoral proctitis among other possibilities.    02/09/2021 Procedure   Colonoscopy, Dr. Alessandra Bevels  Impression: - Preparation of the colon was fair. -  Likely malignant partially obstructing tumor in the distal sigmoid colon. Biopsied. Tattooed. - Congested, inflamed and thickened folds of the mucosa in the recto-sigmoid colon. Biopsied. Tattooed.   02/10/2021 Cancer Staging   Staging form: Colon and Rectum, AJCC 8th Edition - Pathologic stage from 02/10/2021: Stage IIB (pT4a, pN0, cM0) - Signed by Truitt Merle, MD on 02/28/2021 Stage prefix: Initial diagnosis Total positive nodes: 0 Histologic grading system: 4 grade system Histologic grade (G): G2 Residual tumor (R): R0 - None    02/10/2021 Definitive Surgery   FINAL MICROSCOPIC DIAGNOSIS:   A. COLON, SIGMOID:  - Invasive moderately differentiated adenocarcinoma.  - Twenty-seven lymph nodes, negative for metastatic carcinoma (0/27).  - See oncology table below.   B. COLON, DISTAL, SIGMOID:  - Segment of colon with serosal adhesions, acute inflammation, and tattoo pigment.  - Negative for malignancy.    02/11/2021 Initial Diagnosis   Cancer of sigmoid colon ([place])   03/14/2021 -  Chemotherapy   Patient is on Treatment Plan : COLORECTAL Xelox (Capeox) q21d        INTERVAL HISTORY:  Betsey Holiday was contacted for a follow up of colon cancer. She was last seen by me on 03/14/21. She reports she tolerated treatment well overall. She does note she had burning to her arm from the infusion for several days afterwards. She notes she had the cold sensitivity as expected, and this resolved. She reports some constipation, relieved with miralax.    All other systems were reviewed with the patient and are negative.  MEDICAL  HISTORY:  Past Medical History:  Diagnosis Date   Anemia    Anxiety    Complication of anesthesia    took a long time to wake up after surgery    SURGICAL HISTORY: Past Surgical History:  Procedure Laterality Date   ABDOMINAL HYSTERECTOMY Bilateral 09/26/2016   Procedure: HYSTERECTOMY ABDOMINAL W/ BILATERAL SALPINGECTOMY;  Surgeon: Emily Filbert, MD;  Location: [facility];  Service: Gynecology;  Laterality: Bilateral;   BIOPSY  02/09/2021   Procedure: BIOPSY;  Surgeon: Otis Brace, MD;  Location: [facility];  Service: Gastroenterology;;   BREAST BIOPSY Left    COLONOSCOPY N/A 02/09/2021   Procedure: COLONOSCOPY;  Surgeon: Otis Brace, MD;  Location: [facility];  Service: Gastroenterology;  Laterality: N/A;   LAPAROSCOPIC PARTIAL COLECTOMY N/A 02/10/2021   Procedure: ROBOTIC ASSISTED PARTIAL COLECTOMY WITH COLOSTOMY;  Surgeon: Johnathan Hausen, MD;  Location: [facility];  Service: General;  Laterality: N/A;   SUBMUCOSAL TATTOO INJECTION  02/09/2021   Procedure: SUBMUCOSAL TATTOO INJECTION;  Surgeon: Otis Brace, MD;  [facility]: [facility];  Service: Gastroenterology;;   TUBAL LIGATION      I have reviewed the social history and family history with the patient and they are unchanged from previous note.  ALLERGIES:  is allergic to ferrlecit [na ferric gluc cplx in sucrose].  MEDICATIONS:  Current Outpatient Medications  Medication Sig Dispense Refill   acetaminophen (TYLENOL) 500 MG tablet Take 2 tablets (1,000 mg total) by mouth every 6 (six) hours as needed for mild pain. 30 tablet 0   capecitabine (XELODA) 500 MG tablet Take 2 tabs in morning and 3 tabs in evening,every 12 hours, for 14 days, then off for 7 days. Take after meals. 70 tablet 1   methocarbamol (ROBAXIN) 750 MG tablet Take 1 tablet (750 mg total) by mouth every 8 (eight) hours as needed for muscle spasms. 30 tablet 0   Multiple Vitamin (MULTIVITAMIN WITH MINERALS) TABS tablet Take 1 tablet by mouth daily. 150 tablet 0   ondansetron (ZOFRAN) 8 MG tablet Take 1 tablet (8 mg total) by mouth 2 (two) times daily as needed for refractory nausea / vomiting. Start on day 3 after chemotherapy. 30 tablet 1   polyethylene glycol (MIRALAX / GLYCOLAX) 17 g packet Take 17 g by mouth daily. 14 each 0   prochlorperazine (COMPAZINE) 10 MG tablet Take 1 tablet (10 mg total) by mouth every 6  (six) hours as needed (Nausea or vomiting). 30 tablet 1   No current facility-administered medications for this visit.    PHYSICAL EXAMINATION: ECOG PERFORMANCE STATUS: 0 - Asymptomatic  There were no vitals filed for this visit. Wt Readings from Last 3 Encounters:  03/14/21 126 lb 4.8 oz (57.3 kg)  02/28/21 126 lb 1.6 oz (57.2 kg)  02/07/21 134 lb (60.8 kg)     No vitals taken today, Exam not performed today  LABORATORY DATA:  I have reviewed the data as listed CBC Latest Ref Rng & Units 03/14/2021 02/16/2021 02/15/2021  WBC 4.0 - 10.5 K/uL 3.6(L) 7.4 6.3  Hemoglobin 12.0 - 15.0 g/dL 11.2(L) 7.8(L) 8.0(L)  Hematocrit 36.0 - 46.0 % 37.1 26.0(L) 26.5(L)  Platelets 150 - 400 K/uL 401(H) 446(H) 434(H)     CMP Latest Ref Rng & Units 03/14/2021 02/16/2021 02/15/2021  Glucose 70 - 99 mg/dL 87 105(H) 99  BUN 6 - 20 mg/dL 8 5(L) 6  Creatinine 0.44 - 1.00 mg/dL 0.73 0.66 0.51  Sodium 135 - 145 mmol/L 143 138 139  Potassium 3.5 -  5.1 mmol/L 3.8 3.3(L) 3.4(L)  Chloride 98 - 111 mmol/L 106 102 104  CO2 22 - 32 mmol/L _0 Calcium 8.9 - 10.3 mg/dL 10.0 8.4(L) 8.2(L)  Total Protein 6.5 - 8.1 g/dL 8.0 - 5.5(L)  Total Bilirubin 0.3 - 1.2 mg/dL 0.3 - 0.5  Alkaline Phos 38 - 126 U/L 60 - 55  AST 15 - 41 U/L 15 - 24  ALT 0 - 44 U/L 9 - 14      RADIOGRAPHIC STUDIES: I have personally reviewed the radiological images as listed and agreed with the findings in the report. No results found.    No orders of the defined types were placed in this encounter.  All questions were answered. The patient knows to call the clinic with any problems, questions or concerns. No barriers to learning was detected. The total time spent in the appointment was 8 minutes.     Truitt Merle, MD 03/21/2021   I, Wilburn Mylar, am acting as scribe for Truitt Merle, MD.   I have reviewed the above documentation for accuracy and completeness, and I agree with the above.

## 2021-03-28 ENCOUNTER — Encounter: Payer: Self-pay | Admitting: Hematology

## 2021-03-28 ENCOUNTER — Other Ambulatory Visit: Payer: Self-pay | Admitting: Genetic Counselor

## 2021-03-28 ENCOUNTER — Other Ambulatory Visit: Payer: Self-pay

## 2021-03-28 ENCOUNTER — Inpatient Hospital Stay: Payer: 59

## 2021-03-28 ENCOUNTER — Other Ambulatory Visit (HOSPITAL_COMMUNITY): Payer: Self-pay

## 2021-03-28 ENCOUNTER — Inpatient Hospital Stay: Payer: 59 | Attending: Hematology | Admitting: Genetic Counselor

## 2021-03-28 DIAGNOSIS — Z803 Family history of malignant neoplasm of breast: Secondary | ICD-10-CM | POA: Diagnosis not present

## 2021-03-28 DIAGNOSIS — C187 Malignant neoplasm of sigmoid colon: Secondary | ICD-10-CM

## 2021-03-28 DIAGNOSIS — Z800 Family history of malignant neoplasm of digestive organs: Secondary | ICD-10-CM

## 2021-03-28 DIAGNOSIS — Z79899 Other long term (current) drug therapy: Secondary | ICD-10-CM | POA: Insufficient documentation

## 2021-03-28 DIAGNOSIS — Z5111 Encounter for antineoplastic chemotherapy: Secondary | ICD-10-CM | POA: Insufficient documentation

## 2021-03-28 LAB — GENETIC SCREENING ORDER

## 2021-03-29 ENCOUNTER — Other Ambulatory Visit (HOSPITAL_COMMUNITY): Payer: Self-pay

## 2021-03-29 ENCOUNTER — Encounter: Payer: Self-pay | Admitting: Hematology

## 2021-03-30 ENCOUNTER — Encounter: Payer: Self-pay | Admitting: Genetic Counselor

## 2021-03-30 ENCOUNTER — Encounter: Payer: Self-pay | Admitting: Hematology

## 2021-03-30 DIAGNOSIS — Z800 Family history of malignant neoplasm of digestive organs: Secondary | ICD-10-CM

## 2021-03-30 DIAGNOSIS — Z803 Family history of malignant neoplasm of breast: Secondary | ICD-10-CM

## 2021-03-30 HISTORY — DX: Family history of malignant neoplasm of digestive organs: Z80.0

## 2021-03-30 HISTORY — DX: Family history of malignant neoplasm of breast: Z80.3

## 2021-03-30 NOTE — Progress Notes (Signed)
REFERRING PROVIDER: °Feng, Yan, MD °[address] °[place],  [place] [address] ° °PRIMARY PROVIDER:  °No PCP ° °PRIMARY REASON FOR VISIT:  °1. Cancer of sigmoid colon (HCC)   °2. Family history of breast cancer   °3. Family history of colon cancer   ° ° °HISTORY OF PRESENT ILLNESS:   °Ms. Hellard, a 45 y.o. female, was seen for a [place] cancer genetics consultation at the request of Dr. Feng due to a personal history of colon cancer.  Ms. Govoni presents to clinic today to discuss the possibility of a hereditary predisposition to cancer, to discuss genetic testing, and to further clarify her future cancer risks, as well as potential cancer risks for family members.  ° °In 2023, at the age of 45, Ms. Petrov was diagnosed with adenocarcinoma of the colon.  Mismatch repair IHC was intact. She underwent partial colectomy in January 2023 and is currently undergoing chemotherapy.  ° °CANCER HISTORY:  °Oncology History Overview Note  ° Cancer Staging  °Cancer of sigmoid colon (HCC) °Staging form: Colon and Rectum, AJCC 8th Edition °- Pathologic stage from 02/10/2021: Stage IIB (pT4a, pN0, cM0) - Signed by Feng, Yan, MD on 02/28/2021 ° °  °Cancer of sigmoid colon (HCC)  °11/27/2020 Imaging  ° CLINICAL DATA:  Abdominal pain that is began Monday. Patient reports seen scant amounts of dark blood in stool. Feels bloated. °  °EXAM: °CT ABDOMEN AND PELVIS WITH CONTRAST ° °IMPRESSION: °1. Proctocolitis involving the mid and distal sigmoid colon and °upper rectum with a 3.9 cm gas containing interloop abscess centered in the sigmoid mesocolon. °2. Severe fecal burden.  No findings of bowel obstruction. °3. An enlarged left common iliac lymph node is likely reactive. °4. A 2.7 cm hemangioma is noted in the right hepatic lobe. °5. Mild focal bronchiolitis in the right lower lobe. °  °02/07/2021 Imaging  ° CLINICAL DATA:  Abdominal pain, cramping, constipation °  °EXAM: °CT ABDOMEN AND PELVIS WITH CONTRAST ° °IMPRESSION: °1.  There is severe, circumferential wall thickening and mucosal °hyperenhancement involving the distal sigmoid colon and rectum to the anus, generally tethered appearance unchanged, involving at least 15-20 cm of distal sigmoid and rectum. °2. There is again seen an abscess at the medial aspect of the °rectosigmoid junction measuring approximately 3.0 x 1.6 cm. °3. Findings are generally consistent with nonspecific infectious, °inflammatory, or ischemic colitis, including inflammatory bowel °disease such as Crohn's disease or ulcerative colitis; however an °underlying malignancy is very difficult to exclude, particularly °given substantial wall thickening. °4. Large burden of stool throughout the extremely redundant proximal colon, likely reflecting some degree of obstipation. °5. Enlarged left external iliac lymph nodes measuring up to 1.1 x °0.9 cm fall, nonspecific, possibly reactive, however nodal °metastatic disease is not excluded per above. °  °02/09/2021 Initial Biopsy  ° FINAL MICROSCOPIC DIAGNOSIS:  ° °A. COLON MASS, SIGMOID, BIOPSY:  °- Adenomatous lesion with extensive high-grade dysplasia, see comment  ° °B. RECTAL, COLON, SIGMOID, BIOPSY:  °- Mild acute/ subacute nonspecific colitis, see comment  °- Negative for definite features of chronicity, granulomas or dysplasia  ° °COMMENT:  °A.  Evidence of invasive carcinoma is not seen in the submitted biopsies but the biopsy fragments appear superficial and a more severe deeper process cannot be ruled out.  °B.  Histologic features in the rectosigmoid biopsies are not compatible with untreated inflammatory bowel disease.  Differential diagnosis can include infection, drug-effect and stercoral proctitis among other possibilities.  °  °02/09/2021 Procedure  °   Colonoscopy, Dr. Alessandra Bevels  Impression: - Preparation of the colon was fair. - Likely malignant partially obstructing tumor in the distal sigmoid colon. Biopsied. Tattooed. - Congested, inflamed and  thickened folds of the mucosa in the recto-sigmoid colon. Biopsied. Tattooed.   02/10/2021 Cancer Staging   Staging form: Colon and Rectum, AJCC 8th Edition - Pathologic stage from 02/10/2021: Stage IIB (pT4a, pN0, cM0) - Signed by Truitt Merle, MD on 02/28/2021 Stage prefix: Initial diagnosis Total positive nodes: 0 Histologic grading system: 4 grade system Histologic grade (G): G2 Residual tumor (R): R0 - None    02/10/2021 Definitive Surgery   FINAL MICROSCOPIC DIAGNOSIS:   A. COLON, SIGMOID:  - Invasive moderately differentiated adenocarcinoma.  - Twenty-seven lymph nodes, negative for metastatic carcinoma (0/27).  - See oncology table below.   B. COLON, DISTAL, SIGMOID:  - Segment of colon with serosal adhesions, acute inflammation, and tattoo pigment.  - Negative for malignancy.    02/11/2021 Initial Diagnosis   Cancer of sigmoid colon ([place])   03/14/2021 -  Chemotherapy   Patient is on Treatment Plan : COLORECTAL Xelox (Capeox) q21d       RISK FACTORS:  Mammogram within the last year: no Number of breast biopsies:  one biopsy at age 43; cyst per patient . Hysterectomy: yes.  Ovaries intact: yes.  Menarche was at age 77 or 48.  First live birth at age 60.  OCP use for approximately 0 years.  HRT use: 0 years.  Past Medical History:  Diagnosis Date   Anemia    Anxiety    Complication of anesthesia    took a long time to wake up after surgery   Family history of breast cancer 03/30/2021   Family history of colon cancer 03/30/2021    Past Surgical History:  Procedure Laterality Date   ABDOMINAL HYSTERECTOMY Bilateral 09/26/2016   Procedure: HYSTERECTOMY ABDOMINAL W/ BILATERAL SALPINGECTOMY;  Surgeon: Emily Filbert, MD;  Location: [facility];  Service: Gynecology;  Laterality: Bilateral;   BIOPSY  02/09/2021   Procedure: BIOPSY;  Surgeon: Otis Brace, MD;  [facility]: [facility];  Service: Gastroenterology;;   BREAST BIOPSY Left    COLONOSCOPY N/A 02/09/2021   Procedure:  COLONOSCOPY;  Surgeon: Otis Brace, MD;  Location: [facility];  Service: Gastroenterology;  Laterality: N/A;   LAPAROSCOPIC PARTIAL COLECTOMY N/A 02/10/2021   Procedure: ROBOTIC ASSISTED PARTIAL COLECTOMY WITH COLOSTOMY;  Surgeon: Johnathan Hausen, MD;  Location: [facility];  Service: General;  Laterality: N/A;   SUBMUCOSAL TATTOO INJECTION  02/09/2021   Procedure: SUBMUCOSAL TATTOO INJECTION;  Surgeon: Otis Brace, MD;  [facility];  Service: Gastroenterology;;   TUBAL LIGATION      FAMILY HISTORY:  We obtained a detailed, 4-generation family history.  Significant diagnoses are listed below: Family History  Problem Relation Age of Onset   Breast cancer Maternal Aunt    Lymphoma Maternal Aunt    Colon cancer Maternal Uncle 40   Lung cancer Maternal Uncle    Lymphoma Paternal Grandmother        d. 53   Breast cancer Cousin 89       maternal female cousin   Cancer Other        PGF's mother; d. early 56s; unknown type; mets    Ms. Coltrane is unaware of previous family history of genetic testing for hereditary cancer risks. There is no reported Ashkenazi Jewish ancestry. There is no known consanguinity.  GENETIC COUNSELING ASSESSMENT: Ms. Wisnewski is a 46 y.o. female with a  personal history of cancer which is somewhat suggestive of a hereditary cancer syndrome and predisposition to cancer given her age of diagnosis. We, therefore, discussed and recommended the following at today's visit.  ° °DISCUSSION: We discussed that 5 - 10% of cancer is hereditary.  Most cases of hereditary colon cancer are associated with mutations in the Lynch syndrome genes.  There are other genes that can be associated with hereditary colon cancer syndromes.  We discussed that testing is beneficial for several reasons including knowing how to follow individuals for their cancer risks, identifying whether potential treatment options would be beneficial, and understanding if other family members could be  at risk for cancer and allowing them to undergo genetic testing.  ° °We reviewed the characteristics, features and inheritance patterns of hereditary cancer syndromes. We also discussed genetic testing, including the appropriate family members to test, the process of testing, insurance coverage and turn-around-time for results. We discussed the implications of a negative, positive, carrier and/or variant of uncertain significant result. We recommended Ms. Ricketson pursue genetic testing for a panel that includes genes associated with colon and breast cancer.  ° °Ms. Collymore  was offered a common hereditary cancer panel (47 genes) and an expanded pan-cancer panel (77 genes). Ms. Cancro was informed of the benefits and limitations of each panel, including that expanded pan-cancer panels contain genes that do not have clear management guidelines at this point in time.  We also discussed that as the number of genes included on a panel increases, the chances of variants of uncertain significance increases.  After considering the benefits and limitations of each gene panel, Ms. Cuda  elected to have an expanded pan-cancer panel through [facility]. ° °The CancerNext-Expanded gene panel offered by [facility] and includes sequencing, rearrangement, and RNA analysis for the following 77 genes: AIP, ALK, APC, ATM, AXIN2, BAP1, BARD1, BLM, BMPR1A, BRCA1, BRCA2, BRIP1, CDC73, CDH1, CDK4, CDKN1B, CDKN2A, CHEK2, CTNNA1, DICER1, FANCC, FH, FLCN, GALNT12, KIF1B, LZTR1, MAX, MEN1, MET, MLH1, MSH2, MSH3, MSH6, MUTYH, NBN, NF1, NF2, NTHL1, PALB2, PHOX2B, PMS2, POT1, PRKAR1A, PTCH1, PTEN, RAD51C, RAD51D, RB1, RECQL, RET, SDHA, SDHAF2, SDHB, SDHC, SDHD, SMAD4, SMARCA4, SMARCB1, SMARCE1, STK11, SUFU, TMEM127, TP53, TSC1, TSC2, VHL and XRCC2 (sequencing and deletion/duplication); EGFR, EGLN1, HOXB13, KIT, MITF, PDGFRA, POLD1, and POLE (sequencing only); EPCAM and GREM1 (deletion/duplication only).  ° ° °Based on Ms. Ransford's personal  history of cancer, she meets medical criteria for genetic testing. Despite that she meets criteria, she may still have an out of pocket cost. We discussed that if her out of pocket cost for testing is over $100, the laboratory should contact her and discuss the self-pay prices and/or patient pay assistance programs.   ° °PLAN: After considering the risks, benefits, and limitations, Ms. Goering provided informed consent to pursue genetic testing and the blood sample was sent to [facility] for analysis of the CustomNext-Cancer +RNAinsight Panel. Results should be available within approximately 3 weeks' time, at which point they will be disclosed by telephone to Ms. Mesquita, as will any additional recommendations warranted by these results. Ms. Cislo will receive a summary of her genetic counseling visit and a copy of her results once available. This information will also be available in Epic.  ° °Lastly, we encouraged Ms. Gavigan to remain in contact with cancer genetics annually so that we can continuously update the family history and inform her of any changes in cancer genetics and testing that may be of benefit for this family.  ° °Ms.   Bisch's questions were answered to her satisfaction today. Our contact information was provided should additional questions or concerns arise. Thank you for the referral and allowing [place] to share in the care of your patient.   Jettie Mannor M. Joette Catching, [place], [facility] Genetic Counselor [email or web address] (P) [phone]  The patient was seen for a total of 30 minutes in face-to-face genetic counseling.  The patient was accompanied by her daughter, Ubaldo Glassing.  Drs. Lindi Adie and/or Burr Medico were available to discuss this case as needed.    _______________________________________________________________________ For Office Staff:  Number of people involved in session: 2 Was an Intern/ student involved with case: yes; [employer] first year student, Charlies Constable, took pedigree under my  direct supervision

## 2021-04-01 ENCOUNTER — Other Ambulatory Visit: Payer: Self-pay | Admitting: Hematology

## 2021-04-01 DIAGNOSIS — C187 Malignant neoplasm of sigmoid colon: Secondary | ICD-10-CM

## 2021-04-01 MED FILL — Dexamethasone Sodium Phosphate Inj 100 MG/10ML: INTRAMUSCULAR | Qty: 1 | Status: AC

## 2021-04-04 ENCOUNTER — Inpatient Hospital Stay: Payer: 59

## 2021-04-04 ENCOUNTER — Inpatient Hospital Stay (HOSPITAL_BASED_OUTPATIENT_CLINIC_OR_DEPARTMENT_OTHER): Payer: 59 | Admitting: Hematology

## 2021-04-04 ENCOUNTER — Encounter: Payer: Self-pay | Admitting: Hematology

## 2021-04-04 ENCOUNTER — Other Ambulatory Visit: Payer: Self-pay

## 2021-04-04 VITALS — BP 115/88 | HR 77 | Temp 98.7°F | Resp 18 | Wt 127.5 lb

## 2021-04-04 DIAGNOSIS — Z79899 Other long term (current) drug therapy: Secondary | ICD-10-CM | POA: Diagnosis not present

## 2021-04-04 DIAGNOSIS — C187 Malignant neoplasm of sigmoid colon: Secondary | ICD-10-CM

## 2021-04-04 DIAGNOSIS — D500 Iron deficiency anemia secondary to blood loss (chronic): Secondary | ICD-10-CM

## 2021-04-04 DIAGNOSIS — Z5111 Encounter for antineoplastic chemotherapy: Secondary | ICD-10-CM | POA: Diagnosis present

## 2021-04-04 LAB — CBC WITH DIFFERENTIAL/PLATELET
Abs Immature Granulocytes: 0.01 10*3/uL (ref 0.00–0.07)
Basophils Absolute: 0 10*3/uL (ref 0.0–0.1)
Basophils Relative: 1 %
Eosinophils Absolute: 0.1 10*3/uL (ref 0.0–0.5)
Eosinophils Relative: 3 %
HCT: 37.9 % (ref 36.0–46.0)
Hemoglobin: 11.5 g/dL — ABNORMAL LOW (ref 12.0–15.0)
Immature Granulocytes: 0 %
Lymphocytes Relative: 43 %
Lymphs Abs: 1.3 10*3/uL (ref 0.7–4.0)
MCH: 24.8 pg — ABNORMAL LOW (ref 26.0–34.0)
MCHC: 30.3 g/dL (ref 30.0–36.0)
MCV: 81.9 fL (ref 80.0–100.0)
Monocytes Absolute: 0.4 10*3/uL (ref 0.1–1.0)
Monocytes Relative: 13 %
Neutro Abs: 1.2 10*3/uL — ABNORMAL LOW (ref 1.7–7.7)
Neutrophils Relative %: 40 %
Platelets: 193 10*3/uL (ref 150–400)
RBC: 4.63 MIL/uL (ref 3.87–5.11)
RDW: 18.9 % — ABNORMAL HIGH (ref 11.5–15.5)
WBC: 3 10*3/uL — ABNORMAL LOW (ref 4.0–10.5)
nRBC: 0 % (ref 0.0–0.2)

## 2021-04-04 LAB — COMPREHENSIVE METABOLIC PANEL
ALT: 14 U/L (ref 0–44)
AST: 27 U/L (ref 15–41)
Albumin: 4.3 g/dL (ref 3.5–5.0)
Alkaline Phosphatase: 52 U/L (ref 38–126)
Anion gap: 6 (ref 5–15)
BUN: 7 mg/dL (ref 6–20)
CO2: 28 mmol/L (ref 22–32)
Calcium: 10.2 mg/dL (ref 8.9–10.3)
Chloride: 106 mmol/L (ref 98–111)
Creatinine, Ser: 0.64 mg/dL (ref 0.44–1.00)
GFR, Estimated: >60 mL/min (ref >60)
Glucose, Bld: 99 mg/dL (ref 70–99)
Potassium: 3.8 mmol/L (ref 3.5–5.1)
Sodium: 140 mmol/L (ref 135–145)
Total Bilirubin: 0.3 mg/dL (ref 0.3–1.2)
Total Protein: 7.7 g/dL (ref 6.5–8.1)

## 2021-04-04 LAB — CEA (IN HOUSE-CHCC): CEA (CHCC-In House): <1 ng/mL (ref 0.00–5.00)

## 2021-04-04 LAB — FERRITIN: Ferritin: 848 ng/mL — ABNORMAL HIGH (ref 11–307)

## 2021-04-04 MED ORDER — OXALIPLATIN CHEMO INJECTION 100 MG/20ML
130.0000 mg/m2 | Freq: Once | INTRAVENOUS | Status: AC
  Administered 2021-04-04: 210 mg via INTRAVENOUS
  Filled 2021-04-04: qty 42

## 2021-04-04 MED ORDER — SODIUM CHLORIDE 0.9 % IV SOLN
10.0000 mg | Freq: Once | INTRAVENOUS | Status: AC
  Administered 2021-04-04: 10 mg via INTRAVENOUS
  Filled 2021-04-04: qty 10

## 2021-04-04 MED ORDER — DEXTROSE 5 % IV SOLN: Freq: Once | INTRAVENOUS | Status: AC

## 2021-04-04 MED ORDER — PALONOSETRON HCL INJECTION 0.25 MG/5ML
0.2500 mg | Freq: Once | INTRAVENOUS | Status: AC
  Administered 2021-04-04: 0.25 mg via INTRAVENOUS
  Filled 2021-04-04: qty 5

## 2021-04-04 NOTE — Progress Notes (Signed)
Per Dr. Burr Medico, [place] to treat with Oxaliplatin today w/ ANC 1.2 ?

## 2021-04-04 NOTE — Progress Notes (Signed)
Nutrition Follow-up: ? ?Patient with adenocarcinoma of sigmoid colon.  S/p partial colectomy with colostomy on 1/19.  Patient on oxaliplatin.   ? ?Met with patient during infusion.  Reports that her appetite is good.  She is eating mostly 3 meals per day.  Drinking muscle milk.  Reports more formed stool in bag.  MD talked to her about miralax.   ? ? ? ?Medications: reviewed ? ?Labs: reviewed ? ?Anthropometrics:  ? ?Weight 127 lb 8 oz today, increased ?126 lb on 2/20 ? ?UBW of 160-170 per patient about 1 year ago ? ?NUTRITION DIAGNOSIS: Inadequate oral intake improving ? ? ?INTERVENTION:  ?Patient to continue oral nutrition supplements for added nutrition ?Continue working on bowel regimen to prevent constipation ? ?  ? ?MONITORING, EVALUATION, GOAL: weight trends, intake ? ? ?NEXT VISIT: Monday, April 3 during infusion ? ?Georgian Mcclory B. Zenia Resides, RD, LDN ?Registered Dietitian ?[phone] (mobile) ? ? ?

## 2021-04-04 NOTE — Progress Notes (Signed)
Ferritin level 848. Per Dr. Burr Medico hold venofer infusion  ?

## 2021-04-04 NOTE — Progress Notes (Signed)
[place]   Telephone:(336) [phone] Fax:([phone]   Clinic Follow up Note   Patient Care Team: Pcp, No as PCP - General Johnathan Hausen, MD as Consulting Physician (General Surgery) Otis Brace, MD as Consulting Physician (Gastroenterology) Truitt Merle, MD as Consulting Physician (Hematology)  Date of Service:  04/04/2021  CHIEF COMPLAINT: f/u of sigmoid colon cancer  CURRENT THERAPY:  Adjuvant CAPOX, starting 03/14/21  ASSESSMENT & PLAN:  Barbara Valdez is a 46 y.o. female with   1. 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 Returned with worsening abdominal pain on 02/07/21; CT AP showed severe wall thickening and hyperenhancement involving distal sigmoid colon and rectum, 3 cm abscess at rectosigmoid junction, and one enlarged left external iliac lymph node. She was admitted and underwent colonoscopy on 02/09/21 by Dr. Alessandra Bevels showing partially obstructing tumor in distal sigmoid colon and inflamed mucosa in recto-sigmoid colon. Pathology showed adenomatous lesion with extensive high-grade dysplasia and nonspecific colitis. -She underwent emergent partial colectomy on 02/10/21 under Dr. Hassell Done. 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 Margins and lymph nodes negative. -she began CAPOX on 03/14/21 to reduce her risk of recurrence. She tolerated very well with expected cold sensitivity. She does note she had a burning sensation in her arm from the infusion that resolved after 2-3 days. -she recovered well from cycle 1. Labs reviewed, overall stable, adequate to proceed with cycle 2 as scheduled.   2. Constipation -secondary to surgery -she has tried to increase her fiber intake. She endorses taking miralax as needed. I advised her to take this consistently. -nutrition will see her today.   3.  Iron deficiency anemia -likely secondary to  #1 -she received B12 and IV Ferrlecit on 11/29/20 and IV Feraheme on 02/15/21. -improved, hgb 11.5 today (04/04/21)   4. Genetics -given her young age and family history, she qualifies for genetic testing. She is scheduled for counseling and testing on 03/28/21.   5. Mild neutropenia -Secondary to chemotherapy -I reviewed neutropenic fever precautions  PLAN:  -proceed with cycle 2 oxali today -she started cycle 2 Xeloda this morning, 1500 mg in PM and 1000 mg in AM, for 14 days. She is tolerating well  -Lab, f/u and CAPOX in 3 and 6 weeks   No problem-specific Assessment & Plan notes found for this encounter.   SUMMARY OF ONCOLOGIC HISTORY: Oncology History Overview Note   Cancer Staging  Cancer of sigmoid colon [facility]) Staging form: Colon and Rectum, AJCC 8th Edition - Pathologic stage from 02/10/2021: Stage IIB (pT4a, pN0, cM0) - Signed by Truitt Merle, MD on 02/28/2021    Cancer of sigmoid colon ([place])  11/27/2020 Imaging   CLINICAL DATA:  Abdominal pain that is began Monday. Patient reports seen scant amounts of dark blood in stool. Feels bloated.   EXAM: CT ABDOMEN AND PELVIS WITH CONTRAST  IMPRESSION: 1. Proctocolitis involving the mid and distal sigmoid colon and upper rectum with a 3.9 cm gas containing interloop abscess centered in the sigmoid mesocolon. 2. Severe fecal burden.  No findings of bowel obstruction. 3. An enlarged left common iliac lymph node is likely reactive. 4. A 2.7 cm hemangioma is noted in the right hepatic lobe. 5. Mild focal bronchiolitis in the right lower lobe.   02/07/2021 Imaging   CLINICAL DATA:  Abdominal pain, cramping, constipation   EXAM: CT ABDOMEN AND PELVIS WITH CONTRAST  IMPRESSION: 1. There is severe,  circumferential wall thickening and mucosal hyperenhancement involving the distal sigmoid colon and rectum to the anus, generally tethered appearance unchanged, involving at least 15-20 cm of distal sigmoid and rectum. 2. There is again  seen an abscess at the medial aspect of the rectosigmoid junction measuring approximately 3.0 x 1.6 cm. 3. Findings are generally consistent with nonspecific infectious, inflammatory, or ischemic colitis, including inflammatory bowel disease such as Crohn's disease or ulcerative colitis; however an underlying malignancy is very difficult to exclude, particularly given substantial wall thickening. 4. Large burden of stool throughout the extremely redundant proximal colon, likely reflecting some degree of obstipation. 5. Enlarged left external iliac lymph nodes measuring up to 1.1 x 0.9 cm fall, nonspecific, possibly reactive, however nodal metastatic disease is not excluded per above.   02/09/2021 Initial Biopsy   FINAL MICROSCOPIC DIAGNOSIS:   A. COLON MASS, SIGMOID, BIOPSY:  - Adenomatous lesion with extensive high-grade dysplasia, see comment   B. RECTAL, COLON, SIGMOID, BIOPSY:  - Mild acute/ subacute nonspecific colitis, see comment  - Negative for definite features of chronicity, granulomas or dysplasia   COMMENT:  A.  Evidence of invasive carcinoma is not seen in the submitted biopsies but the biopsy fragments appear superficial and a more severe deeper process cannot be ruled out.  B.  Histologic features in the rectosigmoid biopsies are not compatible with untreated inflammatory bowel disease.  Differential diagnosis can include infection, drug-effect and stercoral proctitis among other possibilities.    02/09/2021 Procedure   Colonoscopy, Dr. Alessandra Bevels  Impression: - Preparation of the colon was fair. - Likely malignant partially obstructing tumor in the distal sigmoid colon. Biopsied. Tattooed. - Congested, inflamed and thickened folds of the mucosa in the recto-sigmoid colon. Biopsied. Tattooed.   02/10/2021 Cancer Staging   Staging form: Colon and Rectum, AJCC 8th Edition - Pathologic stage from 02/10/2021: Stage IIB (pT4a, pN0, cM0) - Signed by Truitt Merle, MD on  02/28/2021 Stage prefix: Initial diagnosis Total positive nodes: 0 Histologic grading system: 4 grade system Histologic grade (G): G2 Residual tumor (R): R0 - None    02/10/2021 Definitive Surgery   FINAL MICROSCOPIC DIAGNOSIS:   A. COLON, SIGMOID:  - Invasive moderately differentiated adenocarcinoma.  - Twenty-seven lymph nodes, negative for metastatic carcinoma (0/27).  - See oncology table below.   B. COLON, DISTAL, SIGMOID:  - Segment of colon with serosal adhesions, acute inflammation, and tattoo pigment.  - Negative for malignancy.    02/11/2021 Initial Diagnosis   Cancer of sigmoid colon ([place])   03/14/2021 -  Chemotherapy   Patient is on Treatment Plan : COLORECTAL Xelox (Capeox) q21d        INTERVAL HISTORY:  Barbara Valdez is here for a follow up of colon cancer. She was last seen by me on 03/21/21. She was seen in the infusion area. She reports she has recovered well from her first cycle. She has some skin darkening but no peeling or cracking. She denies issues with bowel movements.   All other systems were reviewed with the patient and are negative.  MEDICAL HISTORY:  Past Medical History:  Diagnosis Date   Anemia    Anxiety    Complication of anesthesia    took a long time to wake up after surgery   Family history of breast cancer 03/30/2021   Family history of colon cancer 03/30/2021    SURGICAL HISTORY: Past Surgical History:  Procedure Laterality Date   ABDOMINAL HYSTERECTOMY Bilateral 09/26/2016   Procedure: HYSTERECTOMY ABDOMINAL W/ BILATERAL SALPINGECTOMY;  Surgeon: Emily Filbert, MD;  Location: [facility];  Service: Gynecology;  Laterality: Bilateral;   BIOPSY  02/09/2021   Procedure: BIOPSY;  Surgeon: Otis Brace, MD;  Location: [facility];  Service: Gastroenterology;;   BREAST BIOPSY Left    COLONOSCOPY N/A 02/09/2021   Procedure: COLONOSCOPY;  Surgeon: Otis Brace, MD;  Location: [facility];  Service: Gastroenterology;  Laterality: N/A;    LAPAROSCOPIC PARTIAL COLECTOMY N/A 02/10/2021   Procedure: ROBOTIC ASSISTED PARTIAL COLECTOMY WITH COLOSTOMY;  Surgeon: Johnathan Hausen, MD;  Location: [facility];  Service: General;  Laterality: N/A;   SUBMUCOSAL TATTOO INJECTION  02/09/2021   Procedure: SUBMUCOSAL TATTOO INJECTION;  Surgeon: Otis Brace, MD;  Location: [facility];  Service: Gastroenterology;;   TUBAL LIGATION      I have reviewed the social history and family history with the patient and they are unchanged from previous note.  ALLERGIES:  is allergic to ferrlecit [na ferric gluc cplx in sucrose].  MEDICATIONS:  Current Outpatient Medications  Medication Sig Dispense Refill   acetaminophen (TYLENOL) 500 MG tablet Take 2 tablets (1,000 mg total) by mouth every 6 (six) hours as needed for mild pain. 30 tablet 0   capecitabine (XELODA) 500 MG tablet Take 2 tabs in morning and 3 tabs in evening,every 12 hours, for 14 days, then off for 7 days. Take after meals. 70 tablet 1   methocarbamol (ROBAXIN) 750 MG tablet Take 1 tablet (750 mg total) by mouth every 8 (eight) hours as needed for muscle spasms. 30 tablet 0   Multiple Vitamin (MULTIVITAMIN WITH MINERALS) TABS tablet Take 1 tablet by mouth daily. 150 tablet 0   ondansetron (ZOFRAN) 8 MG tablet Take 1 tablet (8 mg total) by mouth 2 (two) times daily as needed for refractory nausea / vomiting. Start on day 3 after chemotherapy. 30 tablet 1   polyethylene glycol (MIRALAX / GLYCOLAX) 17 g packet Take 17 g by mouth daily. 14 each 0   prochlorperazine (COMPAZINE) 10 MG tablet TAKE 1 TABLET BY MOUTH EVERY 6 HOURS AS NEEDED 30 tablet 1   No current facility-administered medications for this visit.    PHYSICAL EXAMINATION: ECOG PERFORMANCE STATUS: 0 - Asymptomatic  There were no vitals filed for this visit. Wt Readings from Last 3 Encounters:  04/04/21 127 lb 8 oz (57.8 kg)  03/14/21 126 lb 4.8 oz (57.3 kg)  02/28/21 126 lb 1.6 oz (57.2 kg)     GENERAL:alert, no distress  and comfortable SKIN: skin color normal, no rashes or significant lesions EYES: normal, Conjunctiva are pink and non-injected, sclera clear  NEURO: alert & oriented x 3 with fluent speech  LABORATORY DATA:  I have reviewed the data as listed CBC Latest Ref Rng & Units 04/04/2021 03/14/2021 02/16/2021  WBC 4.0 - 10.5 K/uL 3.0(L) 3.6(L) 7.4  Hemoglobin 12.0 - 15.0 g/dL 11.5(L) 11.2(L) 7.8(L)  Hematocrit 36.0 - 46.0 % 37.9 37.1 26.0(L)  Platelets 150 - 400 K/uL 193 401(H) 446(H)     CMP Latest Ref Rng & Units 04/04/2021 03/14/2021 02/16/2021  Glucose 70 - 99 mg/dL 99 87 105(H)  BUN 6 - 20 mg/dL 7 8 5(L)  Creatinine 0.44 - 1.00 mg/dL 0.64 0.73 0.66  Sodium 135 - 145 mmol/L 140 143 138  Potassium 3.5 - 5.1 mmol/L 3.8 3.8 3.3(L)  Chloride 98 - 111 mmol/L 106 106 102  CO2 22 - 32 mmol/L 28 29 29   Calcium 8.9 - 10.3 mg/dL 10.2 10.0 8.4(L)  Total Protein 6.5 - 8.1 g/dL 7.7  8.0 -  Total Bilirubin 0.3 - 1.2 mg/dL 0.3 0.3 -  Alkaline Phos 38 - 126 U/L 52 60 -  AST 15 - 41 U/L 27 15 -  ALT 0 - 44 U/L 14 9 -      RADIOGRAPHIC STUDIES: I have personally reviewed the radiological images as listed and agreed with the findings in the report. No results found.    No orders of the defined types were placed in this encounter.  All questions were answered. The patient knows to call the clinic with any problems, questions or concerns. No barriers to learning was detected.      Truitt Merle, MD 04/04/2021   I, Wilburn Mylar, am acting as scribe for Truitt Merle, MD.   I have reviewed the above documentation for accuracy and completeness, and I agree with the above.

## 2021-04-04 NOTE — Patient Instructions (Signed)
[facility] MEDICAL [facility]COLOG[facility]  Discharge Instructions: Thank you for choosing [facility] to provide your oncology and hematology care.   If you have a lab appointment with the Cancer Center, please go directly to the Cancer Center and check in at the registration area.   Wear comfortable clothing and clothing appropriate for easy access to any Portacath or PICC line.   We strive to give you quality time with your provider. You may need to reschedule your appointment if you arrive late (15 or more minutes).  Arriving late affects you and other patients whose appointments are after yours.  Also, if you miss three or more appointments without notifying the office, you may be dismissed from the clinic at the provider's discretion.      For prescription refill requests, have your pharmacy contact our office and allow 72 hours for refills to be completed.    Today you received the following chemotherapy and/or immunotherapy agents: Oxaliplatin.       To help prevent nausea and vomiting after your treatment, we encourage you to take your nausea medication as directed.  BELOW ARE SYMPTOMS THAT SHOULD BE REPORTED IMMEDIATELY: *FEVER GREATER THAN 100.4 F (38 C) OR HIGHER *CHILLS OR SWEATING *NAUSEA AND VOMITING THAT IS NOT CONTROLLED WITH YOUR NAUSEA MEDICATION *UNUSUAL SHORTNESS OF BREATH *UNUSUAL BRUISING OR BLEEDING *URINARY PROBLEMS (pain or burning when urinating, or frequent urination) *BOWEL PROBLEMS (unusual diarrhea, constipation, pain near the anus) TENDERNESS IN MOUTH AND THROAT WITH OR WITHOUT PRESENCE OF ULCERS (sore throat, sores in mouth, or a toothache) UNUSUAL RASH, SWELLING OR PAIN  UNUSUAL VAGINAL DISCHARGE OR ITCHING   Items with * indicate a potential emergency and should be followed up as soon as possible or go to the Emergency Department if any problems should occur.  Please show the CHEMOTHERAPY ALERT CARD or IMMUNOTHERAPY ALERT CARD at check-in  to the Emergency Department and triage nurse.  Should you have questions after your visit or need to cancel or reschedule your appointment, please contact [facility]  Dept: [phone]  and follow the prompts.  Office hours are 8:00 a.m. to 4:30 p.m. Monday - Friday. Please note that voicemails left after 4:00 p.m. may not be returned until the following business day.  We are closed weekends and major holidays. You have access to a nurse at all times for urgent questions. Please call the main number to the clinic Dept: [phone] and follow the prompts.   For any non-urgent questions, you may also contact your provider using MyChart. We now offer e-Visits for anyone 18 and older to request care online for non-urgent symptoms. For details visit [email or web address].   Also download the MyChart app! Go to the app store, search "MyChart", open the app, select [facility], and log in with your MyChart username and password.  Due to Covid, a mask is required upon entering the hospital/clinic. If you do not have a mask, one will be given to you upon arrival. For doctor visits, patients may have 1 support person aged 18 or older with them. For treatment visits, patients cannot have anyone with them due to current Covid guidelines and our immunocompromised population.   

## 2021-04-13 ENCOUNTER — Encounter: Payer: Self-pay | Admitting: Hematology

## 2021-04-14 ENCOUNTER — Other Ambulatory Visit (HOSPITAL_COMMUNITY): Payer: Self-pay

## 2021-04-16 ENCOUNTER — Other Ambulatory Visit: Payer: Self-pay | Admitting: Hematology

## 2021-04-16 DIAGNOSIS — C187 Malignant neoplasm of sigmoid colon: Secondary | ICD-10-CM

## 2021-04-21 ENCOUNTER — Encounter: Payer: Self-pay | Admitting: Hematology

## 2021-04-21 ENCOUNTER — Other Ambulatory Visit (HOSPITAL_COMMUNITY): Payer: Self-pay

## 2021-04-25 ENCOUNTER — Other Ambulatory Visit: Payer: Self-pay

## 2021-04-25 ENCOUNTER — Inpatient Hospital Stay: Payer: 59 | Attending: Hematology | Admitting: Hematology

## 2021-04-25 ENCOUNTER — Inpatient Hospital Stay: Payer: 59

## 2021-04-25 ENCOUNTER — Encounter: Payer: Self-pay | Admitting: Hematology

## 2021-04-25 VITALS — BP 127/93 | HR 78 | Temp 98.6°F | Resp 18 | Ht 66.0 in | Wt 128.6 lb

## 2021-04-25 DIAGNOSIS — Z803 Family history of malignant neoplasm of breast: Secondary | ICD-10-CM | POA: Insufficient documentation

## 2021-04-25 DIAGNOSIS — Z79899 Other long term (current) drug therapy: Secondary | ICD-10-CM | POA: Diagnosis not present

## 2021-04-25 DIAGNOSIS — Z5111 Encounter for antineoplastic chemotherapy: Secondary | ICD-10-CM | POA: Diagnosis present

## 2021-04-25 DIAGNOSIS — D509 Iron deficiency anemia, unspecified: Secondary | ICD-10-CM | POA: Insufficient documentation

## 2021-04-25 DIAGNOSIS — C187 Malignant neoplasm of sigmoid colon: Secondary | ICD-10-CM

## 2021-04-25 DIAGNOSIS — T451X5A Adverse effect of antineoplastic and immunosuppressive drugs, initial encounter: Secondary | ICD-10-CM | POA: Insufficient documentation

## 2021-04-25 DIAGNOSIS — Z800 Family history of malignant neoplasm of digestive organs: Secondary | ICD-10-CM | POA: Insufficient documentation

## 2021-04-25 DIAGNOSIS — D701 Agranulocytosis secondary to cancer chemotherapy: Secondary | ICD-10-CM | POA: Insufficient documentation

## 2021-04-25 LAB — COMPREHENSIVE METABOLIC PANEL
ALT: 18 U/L (ref 0–44)
AST: 35 U/L (ref 15–41)
Albumin: 4 g/dL (ref 3.5–5.0)
Alkaline Phosphatase: 71 U/L (ref 38–126)
Anion gap: 8 (ref 5–15)
BUN: 11 mg/dL (ref 6–20)
CO2: 26 mmol/L (ref 22–32)
Calcium: 9.7 mg/dL (ref 8.9–10.3)
Chloride: 108 mmol/L (ref 98–111)
Creatinine, Ser: 0.61 mg/dL (ref 0.44–1.00)
GFR, Estimated: >60 mL/min (ref >60)
Glucose, Bld: 93 mg/dL (ref 70–99)
Potassium: 3.9 mmol/L (ref 3.5–5.1)
Sodium: 142 mmol/L (ref 135–145)
Total Bilirubin: 0.4 mg/dL (ref 0.3–1.2)
Total Protein: 7.9 g/dL (ref 6.5–8.1)

## 2021-04-25 LAB — CBC WITH DIFFERENTIAL/PLATELET
Abs Immature Granulocytes: 0.01 10*3/uL (ref 0.00–0.07)
Basophils Absolute: 0 10*3/uL (ref 0.0–0.1)
Basophils Relative: 1 %
Eosinophils Absolute: 0.1 10*3/uL (ref 0.0–0.5)
Eosinophils Relative: 4 %
HCT: 37.6 % (ref 36.0–46.0)
Hemoglobin: 11.6 g/dL — ABNORMAL LOW (ref 12.0–15.0)
Immature Granulocytes: 0 %
Lymphocytes Relative: 49 %
Lymphs Abs: 1.1 10*3/uL (ref 0.7–4.0)
MCH: 25.6 pg — ABNORMAL LOW (ref 26.0–34.0)
MCHC: 30.9 g/dL (ref 30.0–36.0)
MCV: 82.8 fL (ref 80.0–100.0)
Monocytes Absolute: 0.4 10*3/uL (ref 0.1–1.0)
Monocytes Relative: 15 %
Neutro Abs: 0.7 10*3/uL — ABNORMAL LOW (ref 1.7–7.7)
Neutrophils Relative %: 31 %
Platelets: 191 10*3/uL (ref 150–400)
RBC: 4.54 MIL/uL (ref 3.87–5.11)
RDW: 19.5 % — ABNORMAL HIGH (ref 11.5–15.5)
WBC: 2.3 10*3/uL — ABNORMAL LOW (ref 4.0–10.5)
nRBC: 0 % (ref 0.0–0.2)

## 2021-04-25 NOTE — Progress Notes (Signed)
Nutrition ? ?RD was planning on seeing patient during infusion but infusion cancelled for today.  Will follow-up as able ? ?Zehava Turski B. Zenia Resides, RD, LDN ?Registered Dietitian ?[phone] ? ?

## 2021-04-25 NOTE — Progress Notes (Signed)
?[place]   ?Telephone:(336) [phone] [phone]   ?Clinic Follow up Note  ? ?Patient Care Team: ?Pcp, No as PCP - General ?Johnathan Hausen, MD as Consulting Physician (General Surgery) ?Otis Brace, MD as Consulting Physician (Gastroenterology) ?Truitt Merle, MD as Consulting Physician (Hematology) ? ?Date of Service:  04/25/2021 ? ?CHIEF COMPLAINT: f/u of sigmoid colon cancer ? ?CURRENT THERAPY:  ?Adjuvant CAPOX, starting 03/14/21 ? -Xeloda dose: 1500 mg in PM and 1000 mg in AM, for 14 days ? ?ASSESSMENT & PLAN:  ?Barbara Valdez is a 46 y.o. female with  ? ?1. 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 Returned with worsening abdominal pain on 02/07/21; CT AP showed severe wall thickening and hyperenhancement involving distal sigmoid colon and rectum, 3 cm abscess at rectosigmoid junction, and one enlarged left external iliac lymph node. She was admitted and underwent colonoscopy on 02/09/21 by Dr. Alessandra Bevels showing partially obstructing tumor in distal sigmoid colon and inflamed mucosa in recto-sigmoid colon. Pathology showed adenomatous lesion with extensive high-grade dysplasia and nonspecific colitis. ?-She underwent emergent partial colectomy on 02/10/21 under Dr. Hassell Done. 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 Margins and lymph nodes negative. ?-she began CAPOX on 03/14/21 to reduce her risk of recurrence. She tolerated very well with expected cold sensitivity.  ?-she recovered well from cycle 2. Labs reviewed, her Sullivan is 0.7 today. I recommend we hold her treatment for a week, despite her feeling well. I advised her to hold her Xeloda for a week as well. She will restart Xeloda next week before she comes in. ?-we discussed she will have one more cycle of oxali after next week and then continue Xeloda for 3 more months. She notes her other doctors are planning  for colonoscopy in May, then colostomy reversal in July/August. ?  ?2.  Iron deficiency anemia ?-likely secondary to #1 ?-she received B12 and IV Ferrlecit on 11/29/20 and IV Feraheme on 02/15/21. ?-stable now, hgb 11.6 today (04/25/21) ?  ?3. Genetics ?-given her young age and family history, she qualifies for genetic testing. She is scheduled for counseling and testing on 03/28/21. ?  ?4. Neutropenia ?-Secondary to chemotherapy ?-we will postpone treatment for a week. ? ?  ?PLAN:  ?-postpone cycle 3 oxali and Xeloda by a week due to her ANC 0.7 ?-hold Xeloda for a week and restart next Monday  ?-Lab, f/u and CAPOX in 4 weeks ?-will copy Dr. Hassell Done  ? ? ?No problem-specific Assessment & Plan notes found for this encounter. ? ? ?SUMMARY OF ONCOLOGIC HISTORY: ?Oncology History Overview Note  ? Cancer Staging  ?Cancer of sigmoid colon ([place]) ?Staging form: Colon and Rectum, AJCC 8th Edition ?- Pathologic stage from 02/10/2021: Stage IIB (pT4a, pN0, cM0) - Signed by Truitt Merle, MD on 02/28/2021 ? ?  ?Cancer of sigmoid colon ([place])  ?11/27/2020 Imaging  ? CLINICAL DATA:  Abdominal pain that is began Monday. Patient reports seen scant amounts of dark blood in stool. Feels bloated. ?  ?EXAM: ?CT ABDOMEN AND PELVIS WITH CONTRAST ? ?IMPRESSION: ?1. Proctocolitis involving the mid and distal sigmoid colon and ?upper rectum with a 3.9 cm gas containing interloop abscess centered in the sigmoid mesocolon. ?2. Severe fecal burden.  No findings of bowel obstruction. ?3. An enlarged left common iliac lymph node is likely reactive. ?4. A 2.7 cm hemangioma is noted in the right hepatic lobe. ?5. Mild focal bronchiolitis in  the right lower lobe. ?  ?02/07/2021 Imaging  ? CLINICAL DATA:  Abdominal pain, cramping, constipation ?  ?EXAM: ?CT ABDOMEN AND PELVIS WITH CONTRAST ? ?IMPRESSION: ?1. There is severe, circumferential wall thickening and mucosal ?hyperenhancement involving the distal sigmoid colon and rectum to the anus, generally tethered  appearance unchanged, involving at least 15-20 cm of distal sigmoid and rectum. ?2. There is again seen an abscess at the medial aspect of the ?rectosigmoid junction measuring approximately 3.0 x 1.6 cm. ?3. Findings are generally consistent with nonspecific infectious, ?inflammatory, or ischemic colitis, including inflammatory bowel ?disease such as Crohn's disease or ulcerative colitis; however an ?underlying malignancy is very difficult to exclude, particularly ?given substantial wall thickening. ?4. Large burden of stool throughout the extremely redundant proximal colon, likely reflecting some degree of obstipation. ?5. Enlarged left external iliac lymph nodes measuring up to 1.1 x ?0.9 cm fall, nonspecific, possibly reactive, however nodal ?metastatic disease is not excluded per above. ?  ?02/09/2021 Initial Biopsy  ? FINAL MICROSCOPIC DIAGNOSIS:  ? ?A. COLON MASS, SIGMOID, BIOPSY:  ?- Adenomatous lesion with extensive high-grade dysplasia, see comment  ? ?B. RECTAL, COLON, SIGMOID, BIOPSY:  ?- Mild acute/ subacute nonspecific colitis, see comment  ?- Negative for definite features of chronicity, granulomas or dysplasia  ? ?COMMENT:  ?A.  Evidence of invasive carcinoma is not seen in the submitted biopsies but the biopsy fragments appear superficial and a more severe deeper process cannot be ruled out.  ?B.  Histologic features in the rectosigmoid biopsies are not compatible with untreated inflammatory bowel disease.  Differential diagnosis can include infection, drug-effect and stercoral proctitis among other possibilities.  ?  ?02/09/2021 Procedure  ? Colonoscopy, Dr. Alessandra Bevels ? ?Impression: ?- Preparation of the colon was fair. ?- Likely malignant partially obstructing tumor in the distal sigmoid colon. Biopsied. Tattooed. ?- Congested, inflamed and thickened folds of the mucosa in the recto-sigmoid colon. Biopsied. Tattooed. ?  ?02/10/2021 Cancer Staging  ? Staging form: Colon and Rectum, AJCC 8th Edition ?-  Pathologic stage from 02/10/2021: Stage IIB (pT4a, pN0, cM0) - Signed by Truitt Merle, MD on 02/28/2021 ?Stage prefix: Initial diagnosis ?Total positive nodes: 0 ?Histologic grading system: 4 grade system ?Histologic grade (G): G2 ?Residual tumor (R): R0 - None ? ?  ?02/10/2021 Definitive Surgery  ? FINAL MICROSCOPIC DIAGNOSIS:  ? ?A. COLON, SIGMOID:  ?- Invasive moderately differentiated adenocarcinoma.  ?- Twenty-seven lymph nodes, negative for metastatic carcinoma (0/27).  ?- See oncology table below.  ? ?B. COLON, DISTAL, SIGMOID:  ?- Segment of colon with serosal adhesions, acute inflammation, and tattoo pigment.  ?- Negative for malignancy.  ?  ?02/11/2021 Initial Diagnosis  ? Cancer of sigmoid colon [facility]) ?  ?03/14/2021 -  Chemotherapy  ? Patient is on Treatment Plan : COLORECTAL Xelox (Capeox) q21d  ?   ? ? ? ?INTERVAL HISTORY:  ?Caylea Foronda is here for a follow up of colon cancer. She was last seen by me on 04/04/21. She presents to the clinic accompanied by her mother. ?She reports hand tingling and mouth sensitivity from the infusion. She notes it lasts about 2 weeks then resolves. She also notes a small knot forming at the infusion site. ?She reports she is having loose stool, denies diarrhea and stomach pain. ?  ?All other systems were reviewed with the patient and are negative. ? ?MEDICAL HISTORY:  ?Past Medical History:  ?Diagnosis Date  ? Anemia   ? Anxiety   ? Complication of anesthesia   ? took  a long time to wake up after surgery  ? Family history of breast cancer 03/30/2021  ? Family history of colon cancer 03/30/2021  ? ? ?SURGICAL HISTORY: ?Past Surgical History:  ?Procedure Laterality Date  ? ABDOMINAL HYSTERECTOMY Bilateral 09/26/2016  ? Procedure: HYSTERECTOMY ABDOMINAL W/ BILATERAL SALPINGECTOMY;  Surgeon: Emily Filbert, MD;  Location: [facility];  Service: Gynecology;  Laterality: Bilateral;  ? BIOPSY  02/09/2021  ? Procedure: BIOPSY;  Surgeon: Otis Brace, MD;  [facility];  Service:  Gastroenterology;;  ? BREAST BIOPSY Left   ? COLONOSCOPY N/A 02/09/2021  ? Procedure: COLONOSCOPY;  Surgeon: Otis Brace, MD;  Location[facility];  Service: Gastroenterology;  Laterality: N/A;  ? [place]

## 2021-04-26 ENCOUNTER — Telehealth: Payer: Self-pay | Admitting: Hematology

## 2021-04-26 NOTE — Telephone Encounter (Signed)
.  Called patient to schedule appointment per 4/3 los, patient is aware of date and time.   ?

## 2021-04-28 ENCOUNTER — Encounter: Payer: Self-pay | Admitting: Genetic Counselor

## 2021-04-28 ENCOUNTER — Ambulatory Visit: Payer: Self-pay | Admitting: Genetic Counselor

## 2021-04-28 ENCOUNTER — Telehealth: Payer: Self-pay | Admitting: Genetic Counselor

## 2021-04-28 DIAGNOSIS — Z803 Family history of malignant neoplasm of breast: Secondary | ICD-10-CM

## 2021-04-28 DIAGNOSIS — Z1379 Encounter for other screening for genetic and chromosomal anomalies: Secondary | ICD-10-CM | POA: Insufficient documentation

## 2021-04-28 DIAGNOSIS — C187 Malignant neoplasm of sigmoid colon: Secondary | ICD-10-CM

## 2021-04-28 DIAGNOSIS — Z800 Family history of malignant neoplasm of digestive organs: Secondary | ICD-10-CM

## 2021-04-28 NOTE — Progress Notes (Signed)
HPI:   ?Barbara Valdez was previously seen in the [facility] clinic due to a personal history of clon cancer and concerns regarding a hereditary predisposition to cancer. Please refer to our prior cancer genetics clinic note for more information regarding our discussion, assessment and recommendations, at the time. Barbara Valdez recent genetic test results were disclosed to her, as were recommendations warranted by these results. These results and recommendations are discussed in more detail below. ? ?CANCER HISTORY:  ?Oncology History Overview Note  ? 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 ?  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 CLINICAL DATA:  Abdominal pain that is began Monday. Patient reports seen scant amounts of dark blood in stool. Feels bloated. ?  ?EXAM: ?CT ABDOMEN AND PELVIS WITH CONTRAST ? ?IMPRESSION: ?1. Proctocolitis involving the mid and distal sigmoid colon and ?upper rectum with a 3.9 cm gas containing interloop abscess centered in the sigmoid mesocolon. ?2. Severe fecal burden.  No findings of bowel obstruction. ?3. An enlarged left common iliac lymph node is likely reactive. ?4. A 2.7 cm hemangioma is noted in the right hepatic lobe. ?5. Mild focal bronchiolitis in the right lower lobe. ?  ?02/07/2021 Imaging  ? CLINICAL DATA:  Abdominal pain, cramping, constipation ?  ?EXAM: ?CT ABDOMEN AND PELVIS WITH CONTRAST ? ?IMPRESSION: ?1. There is severe, circumferential wall thickening and mucosal ?hyperenhancement involving the distal sigmoid colon and rectum to the anus, generally tethered appearance unchanged, involving at least 15-20 cm of distal sigmoid and rectum. ?2. There is again seen an abscess at the medial aspect of the ?rectosigmoid junction measuring approximately 3.0 x 1.6 cm. ?3. Findings are  generally consistent with nonspecific infectious, ?inflammatory, or ischemic colitis, including inflammatory bowel ?disease such as Crohn's disease or ulcerative colitis; however an ?underlying malignancy is very difficult to exclude, particularly ?given substantial wall thickening. ?4. Large burden of stool throughout the extremely redundant proximal colon, likely reflecting some degree of obstipation. ?5. Enlarged left external iliac lymph nodes measuring up to 1.1 x ?0.9 cm fall, nonspecific, possibly reactive, however nodal ?metastatic disease is not excluded per above. ?  ?02/09/2021 Initial Biopsy  ? FINAL MICROSCOPIC DIAGNOSIS:  ? ?A. COLON MASS, SIGMOID, BIOPSY:  ?- Adenomatous lesion with extensive high-grade dysplasia, see comment  ? ?B. RECTAL, COLON, SIGMOID, BIOPSY:  ?- Mild acute/ subacute nonspecific colitis, see comment  ?- Negative for definite features of chronicity, granulomas or dysplasia  ? ?COMMENT:  ?A.  Evidence of invasive carcinoma is not seen in the submitted biopsies but the biopsy fragments appear superficial and a more severe deeper process cannot be ruled out.  ?B.  Histologic features in the rectosigmoid biopsies are not compatible with untreated inflammatory bowel disease.  Differential diagnosis can include infection, drug-effect and stercoral proctitis among other possibilities.  ?  ?02/09/2021 Procedure  ? Colonoscopy, Dr. Alessandra Bevels ? ?Impression: ?- Preparation of the colon was fair. ?- Likely malignant partially obstructing tumor in the distal sigmoid colon. Biopsied. Tattooed. ?- Congested, inflamed and thickened folds of the mucosa in the recto-sigmoid colon. Biopsied. Tattooed. ?  ?02/10/2021 Cancer Staging  ? Staging form: Colon and Rectum, AJCC 8th Edition ?- Pathologic stage from 02/10/2021: Stage IIB (pT4a, pN0, cM0) - Signed by Truitt Merle, MD on 02/28/2021 ?Stage prefix: Initial diagnosis ?Total positive nodes:  0 ?Histologic grading system: 4 grade system ?Histologic grade  (G): G2 ?Residual tumor (R): R0 - None ? ?  ?02/10/2021 Definitive Surgery  ? FINAL MICROSCOPIC DIAGNOSIS:  ? ?A. COLON, SIGMOID:  ?- Invasive moderately differentiated adenocarcinoma.  ?- Twenty-seven lymph nodes, negative for metastatic carcinoma (0/27).  ?- See oncology table below.  ? ?B. COLON, DISTAL, SIGMOID:  ?- Segment of colon with serosal adhesions, acute inflammation, and tattoo pigment.  ?- Negative for malignancy.  ?  ?02/11/2021 Initial Diagnosis  ? Cancer of sigmoid colon [facility]) ?  ?03/14/2021 -  Chemotherapy  ? Patient is on Treatment Plan : COLORECTAL Xelox ([place]apeox) q21d  ?   ?04/20/2021 Genetic Testing  ? Negative hereditary cancer genetic testing: no pathogenic variants detected in [facility] CustomNext-Cancer +RNAinsight Panel.  Report date is April 20, 2021.  ? ?The CustomNext-Cancer+RNAinsight panel offered by [facility] includes sequencing and rearrangement analysis for the following 47 genes:  APC, ATM, AXIN2, BARD1, BMPR1A, BRCA1, BRCA2, BRIP1, CDH1, CDK4, CDKN2A, CHEK2, DICER1, EPCAM, GREM1, HOXB13, MEN1, MLH1, MSH2, MSH3, MSH6, MUTYH, NBN, NF1, NF2, NTHL1, PALB2, PMS2, POLD1, POLE, PTEN, RAD51C, RAD51D, RECQL, RET, SDHA, SDHAF2, SDHB, SDHC, SDHD, SMAD4, SMARCA4, STK11, TP53, TSC1, TSC2, and VHL.  RNA data is routinely analyzed for use in variant interpretation for all genes. ? ?  ? ? ?FAMILY HISTORY:  ?We obtained a detailed, 4-generation family history.  Significant diagnoses are listed below: ?     ?Family History  ?Problem Relation Age of Onset  ? Breast cancer Maternal Aunt    ? Lymphoma Maternal Aunt    ? Colon cancer Maternal Uncle 31  ? Lung cancer Maternal Uncle    ? Lymphoma Paternal Grandmother    ?      d. 51  ? Breast cancer Cousin 33  ?      maternal female cousin  ? Cancer Other    ?      PGF's mother; d. early 37s; unknown type; mets  ?  ?Ms. Mears is unaware of previous family history of genetic testing for hereditary cancer risks. There is no reported Ashkenazi Jewish  ancestry. There is no known consanguinity. ?  ? ?GENETIC TEST RESULTS:  ?The [facility] CustomNext-Cancer +RNAinsight Panel found no pathogenic mutations. The CustomNext-Cancer+RNAinsight panel offered by [facility] includes sequencing and rearrangement analysis for the following 47 genes:  APC, ATM, AXIN2, BARD1, BMPR1A, BRCA1, BRCA2, BRIP1, CDH1, CDK4, CDKN2A, CHEK2, DICER1, EPCAM, GREM1, HOXB13, MEN1, MLH1, MSH2, MSH3, MSH6, MUTYH, NBN, NF1, NF2, NTHL1, PALB2, PMS2, POLD1, POLE, PTEN, RAD51C, RAD51D, RECQL, RET, SDHA, SDHAF2, SDHB, SDHC, SDHD, SMAD4, SMARCA4, STK11, TP53, TSC1, TSC2, and VHL.  RNA data is routinely analyzed for use in variant interpretation for all genes. ? ?The test report has been scanned into EPIC and is located under the Molecular Pathology section of the Results Review tab.  A portion of the result report is included below for reference. Genetic testing reported out on April 20, 2021.  ? ? ? ?Even though a pathogenic variant was not identified, possible explanations for the cancer in the family may include: ?There may be no hereditary risk for cancer in the family. The cancers in Ms. Polio and/or her family may be sporadic/familial or due to other genetic and environmental factors. ?There may be a gene mutation in one of these genes that current testing methods cannot detect but that chance is small. ?There could be another gene that has not yet been discovered, or that we have not yet  tested, that is responsible for the cancer diagnoses in the family.  ?It is also possible there is a hereditary cause for the cancer in the family that Ms. Bump did not inherit. ? ?Therefore, it is important to remain in touch with cancer genetics in the future so that we can continue to offer Ms. Novinger the most up to date genetic testing.  ? ? ?ADDITIONAL GENETIC TESTING:  ?We discussed with Ms. Shaheed that her genetic testing was fairly extensive.  If there are genes identified to increase cancer risk  that can be analyzed in the future, we would be happy to discuss and coordinate this testing at that time.   ? ?CANCER SCREENING RECOMMENDATIONS:  ?Ms. Zingale's test result is considered negative (normal).  This me

## 2021-04-28 NOTE — Telephone Encounter (Signed)
Revealed negative genetic testing.  Discussed that we do not know why she has colon cancer or why there is cancer in the family. It could be sporadic/famillial, due to a different gene that we are not testing, or maybe our current technology may not be able to pick something up.  It will be important for her to keep in contact with genetics to keep up with whether additional testing may be needed.   ? ? ?

## 2021-05-03 ENCOUNTER — Inpatient Hospital Stay: Payer: 59

## 2021-05-03 ENCOUNTER — Other Ambulatory Visit: Payer: Self-pay

## 2021-05-03 VITALS — BP 133/90 | HR 85 | Temp 98.8°F | Resp 20 | Ht 66.0 in | Wt 130.5 lb

## 2021-05-03 DIAGNOSIS — D500 Iron deficiency anemia secondary to blood loss (chronic): Secondary | ICD-10-CM

## 2021-05-03 DIAGNOSIS — C187 Malignant neoplasm of sigmoid colon: Secondary | ICD-10-CM

## 2021-05-03 DIAGNOSIS — Z5111 Encounter for antineoplastic chemotherapy: Secondary | ICD-10-CM | POA: Diagnosis not present

## 2021-05-03 LAB — CBC WITH DIFFERENTIAL/PLATELET
Abs Immature Granulocytes: 0.01 10*3/uL (ref 0.00–0.07)
Basophils Absolute: 0 10*3/uL (ref 0.0–0.1)
Basophils Relative: 1 %
Eosinophils Absolute: 0.1 10*3/uL (ref 0.0–0.5)
Eosinophils Relative: 2 %
HCT: 35.2 % — ABNORMAL LOW (ref 36.0–46.0)
Hemoglobin: 11.1 g/dL — ABNORMAL LOW (ref 12.0–15.0)
Immature Granulocytes: 0 %
Lymphocytes Relative: 48 %
Lymphs Abs: 1.6 10*3/uL (ref 0.7–4.0)
MCH: 25.8 pg — ABNORMAL LOW (ref 26.0–34.0)
MCHC: 31.5 g/dL (ref 30.0–36.0)
MCV: 81.9 fL (ref 80.0–100.0)
Monocytes Absolute: 0.4 10*3/uL (ref 0.1–1.0)
Monocytes Relative: 12 %
Neutro Abs: 1.2 10*3/uL — ABNORMAL LOW (ref 1.7–7.7)
Neutrophils Relative %: 37 %
Platelets: 133 10*3/uL — ABNORMAL LOW (ref 150–400)
RBC: 4.3 MIL/uL (ref 3.87–5.11)
RDW: 19 % — ABNORMAL HIGH (ref 11.5–15.5)
WBC: 3.2 10*3/uL — ABNORMAL LOW (ref 4.0–10.5)
nRBC: 0 % (ref 0.0–0.2)

## 2021-05-03 LAB — COMPREHENSIVE METABOLIC PANEL WITH GFR
ALT: 18 U/L (ref 0–44)
AST: 34 U/L (ref 15–41)
Albumin: 4.2 g/dL (ref 3.5–5.0)
Alkaline Phosphatase: 88 U/L (ref 38–126)
Anion gap: 7 (ref 5–15)
BUN: 11 mg/dL (ref 6–20)
CO2: 28 mmol/L (ref 22–32)
Calcium: 9.9 mg/dL (ref 8.9–10.3)
Chloride: 107 mmol/L (ref 98–111)
Creatinine, Ser: 0.7 mg/dL (ref 0.44–1.00)
GFR, Estimated: >60 mL/min (ref >60)
Glucose, Bld: 84 mg/dL (ref 70–99)
Potassium: 3.6 mmol/L (ref 3.5–5.1)
Sodium: 142 mmol/L (ref 135–145)
Total Bilirubin: 0.5 mg/dL (ref 0.3–1.2)
Total Protein: 8.3 g/dL — ABNORMAL HIGH (ref 6.5–8.1)

## 2021-05-03 MED ORDER — SODIUM CHLORIDE 0.9 % IV SOLN: Freq: Once | INTRAVENOUS | Status: AC

## 2021-05-03 MED ORDER — OXALIPLATIN CHEMO INJECTION 100 MG/20ML
130.0000 mg/m2 | Freq: Once | INTRAVENOUS | Status: AC
  Administered 2021-05-03: 210 mg via INTRAVENOUS
  Filled 2021-05-03: qty 40

## 2021-05-03 MED ORDER — PALONOSETRON HCL INJECTION 0.25 MG/5ML
0.2500 mg | Freq: Once | INTRAVENOUS | Status: AC
  Administered 2021-05-03: 0.25 mg via INTRAVENOUS
  Filled 2021-05-03: qty 5

## 2021-05-03 MED ORDER — SODIUM CHLORIDE 0.9 % IV SOLN
10.0000 mg | Freq: Once | INTRAVENOUS | Status: AC
  Administered 2021-05-03: 10 mg via INTRAVENOUS
  Filled 2021-05-03: qty 1
  Filled 2021-05-03: qty 10

## 2021-05-03 MED ORDER — DEXTROSE 5 % IV SOLN: Freq: Once | INTRAVENOUS | Status: AC

## 2021-05-03 MED ORDER — SODIUM CHLORIDE 0.9 % IV SOLN: 300.0000 mg | Freq: Once | INTRAVENOUS | Status: DC

## 2021-05-03 NOTE — Progress Notes (Signed)
Per Dr. Burr Medico, no Venofer treatment today. ?

## 2021-05-03 NOTE — Patient Instructions (Addendum)
[facility]  Discharge Instructions: ?Thank you for choosing [facility] to provide your oncology and hematology care.  ? ?If you have a lab appointment with the [facility], please go directly to the [facility] and check in at the registration area. ?  ?Wear comfortable clothing and clothing appropriate for easy access to any Portacath or PICC line.  ? ?We strive to give you quality time with your provider. You may need to reschedule your appointment if you arrive late (15 or more minutes).  Arriving late affects you and other patients whose appointments are after yours.  Also, if you miss three or more appointments without notifying the office, you may be dismissed from the clinic at the provider?s discretion.    ?  ?For prescription refill requests, have your pharmacy contact our office and allow 72 hours for refills to be completed.   ? ?Today you received the following chemotherapy and/or immunotherapy agent: Oxaliplatin ?  ?To help prevent nausea and vomiting after your treatment, we encourage you to take your nausea medication as directed. ? ?BELOW ARE SYMPTOMS THAT SHOULD BE REPORTED IMMEDIATELY: ?*FEVER GREATER THAN 100.4 F (38 ?C) OR HIGHER ?*CHILLS OR SWEATING ?*NAUSEA AND VOMITING THAT IS NOT CONTROLLED WITH YOUR NAUSEA MEDICATION ?*UNUSUAL SHORTNESS OF BREATH ?*UNUSUAL BRUISING OR BLEEDING ?*URINARY PROBLEMS (pain or burning when urinating, or frequent urination) ?*BOWEL PROBLEMS (unusual diarrhea, constipation, pain near the anus) ?TENDERNESS IN MOUTH AND THROAT WITH OR WITHOUT PRESENCE OF ULCERS (sore throat, sores in mouth, or a toothache) ?UNUSUAL RASH, SWELLING OR PAIN  ?UNUSUAL VAGINAL DISCHARGE OR ITCHING  ? ?Items with * indicate a potential emergency and should be followed up as soon as possible or go to the Emergency Department if any problems should occur. ? ?Please show the CHEMOTHERAPY ALERT CARD or IMMUNOTHERAPY ALERT CARD at check-in to  the Emergency Department and triage nurse. ? ?Should you have questions after your visit or need to cancel or reschedule your appointment, please contact [facility]  Dept: [phone]  and follow the prompts.  Office hours are 8:00 a.m. to 4:30 p.m. Monday - Friday. Please note that voicemails left after 4:00 p.m. may not be returned until the following business day.  We are closed weekends and major holidays. You have access to a nurse at all times for urgent questions. Please call the main number to the clinic Dept: [phone] and follow the prompts. ? ? ?For any non-urgent questions, you may also contact your provider using MyChart. We now offer e-Visits for anyone 62 and older to request care online for non-urgent symptoms. For details visit [email or web address]. ?  ?Also download the MyChart app! Go to the app store, search "MyChart", open the app, select [facility], and log in with your MyChart username and password. ? ?Due to Covid, a mask is required upon entering the hospital/clinic. If you do not have a mask, one will be given to you upon arrival. For doctor visits, patients may have 1 support person aged 15 or older with them. For treatment visits, patients cannot have anyone with them due to current Covid guidelines and our immunocompromised population.  ?

## 2021-05-03 NOTE — Progress Notes (Signed)
Per verbal order from Dr. Burr Medico, "OK To Treat w/ANC 1.2" today. ?

## 2021-05-05 ENCOUNTER — Other Ambulatory Visit (HOSPITAL_COMMUNITY): Payer: Self-pay

## 2021-05-09 ENCOUNTER — Other Ambulatory Visit: Payer: Self-pay

## 2021-05-09 ENCOUNTER — Other Ambulatory Visit (HOSPITAL_COMMUNITY): Payer: Self-pay

## 2021-05-09 ENCOUNTER — Other Ambulatory Visit: Payer: Self-pay | Admitting: Hematology

## 2021-05-09 ENCOUNTER — Encounter (HOSPITAL_BASED_OUTPATIENT_CLINIC_OR_DEPARTMENT_OTHER): Payer: Self-pay

## 2021-05-09 ENCOUNTER — Emergency Department (HOSPITAL_COMMUNITY): Admission: EM | Admit: 2021-05-09 | Discharge: 2021-05-09 | Discharge status: Against Medical Advice | Payer: 59

## 2021-05-09 DIAGNOSIS — K9400 Colostomy complication, unspecified: Secondary | ICD-10-CM | POA: Diagnosis present

## 2021-05-09 MED ORDER — CAPECITABINE 500 MG PO TABS
ORAL_TABLET | ORAL | 1 refills | Status: DC
  Filled 2021-05-16 (×2): qty 70, 21d supply, fill #0

## 2021-05-09 NOTE — ED Triage Notes (Signed)
Patient here POV from Home. ? ?Patient endorses History of Colon Cancer since January. 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 Patient has been on Chemotherapy Treatment (Last Treatment Tuesday) and causes her to have Constipation. ? ?Patient was changing Ostomy Bag today and noted Stoma was inward somewhat. No Associated ABD Pain. No Fevers. 1 Episode of Emesis today. ? ?NAD Noted during Triage. A&Ox4. GCS 15. Ambulatory. ?

## 2021-05-10 ENCOUNTER — Emergency Department (HOSPITAL_BASED_OUTPATIENT_CLINIC_OR_DEPARTMENT_OTHER)
Admission: EM | Admit: 2021-05-10 | Discharge: 2021-05-10 | Disposition: A | Discharge status: Home / Self Care | Payer: 59 | Attending: Emergency Medicine | Admitting: Emergency Medicine

## 2021-05-10 DIAGNOSIS — K9400 Colostomy complication, unspecified: Secondary | ICD-10-CM

## 2021-05-10 NOTE — ED Provider Notes (Signed)
? ?[facility] EM[facility] DEPT  ?Provider Note ? ?CSN: [id number] ?Arrival date & time: 05/09/21 2225 ? ?History ?Chief Complaint  ?Patient presents with  ? GI Problem  ? ? ?Barbara Valdez is a 46 y.o. female with partial colectomy for colon cancer earlier this year has a colostomy in her LLQ reports she has been constipated recently, not taking a stool softener. This evening when she was changing her ostomy device she noticed her stoma was more recessed than sticking out like she is used to. Denies any pain, no bleeding, no vomiting. She has not had a fever. She states she called her surgeon's office and was told to come to the ED.  ? ? ?Home Medications ?Prior to Admission medications   ?Medication Sig Start Date End Date Taking? Authorizing Provider  ?acetaminophen (TYLENOL) 500 MG tablet Take 2 tablets (1,000 mg total) by mouth every 6 (six) hours as needed for mild pain. 02/15/21   Meuth, Brooke A, PA-C  ?capecitabine (XELODA) 500 MG tablet Take 2 tabs in morning and 3 tabs in evening,every 12 hours, for 14 days, then off for 7 days. Take after meals. 05/09/21   Truitt Merle, MD  ?methocarbamol (ROBAXIN) 750 MG tablet Take 1 tablet (750 mg total) by mouth every 8 (eight) hours as needed for muscle spasms. 02/15/21   Meuth, Blaine Hamper, PA-C  ?Multiple Vitamin (MULTIVITAMIN WITH MINERALS) TABS tablet Take 1 tablet by mouth daily. 02/16/21   Lavina Hamman, MD  ?ondansetron (ZOFRAN) 8 MG tablet Take 1 tablet (8 mg total) by mouth 2 (two) times daily as needed for refractory nausea / vomiting. Start on day 3 after chemotherapy. 03/14/21   Truitt Merle, MD  ?polyethylene glycol (MIRALAX / GLYCOLAX) 17 g packet Take 17 g by mouth daily. 02/16/21   Lavina Hamman, MD  ?prochlorperazine (COMPAZINE) 10 MG tablet TAKE 1 TABLET BY MOUTH EVERY 6 HOURS AS NEEDED 04/16/21   Truitt Merle, MD  ? ? ? ?Allergies    ?Ferrlecit [na ferric gluc cplx in sucrose] ? ? ?Review of Systems   ?Review of Systems ?Please see HPI for pertinent  positives and negatives ? ?Physical Exam ?BP 119/70 (BP Location: Right Arm)   Pulse 93   Temp 97.8 ?F (36.6 ?C) (Temporal)   Resp 18   Ht 5' 6"  (1.676 m)   Wt 59.2 kg   LMP 10/23/2016   SpO2 100%   BMI 21.07 kg/m?  ? ?Physical Exam ?Vitals and nursing note reviewed.  ?Constitutional:   ?   Appearance: Normal appearance.  ?HENT:  ?   Head: Normocephalic and atraumatic.  ?   Nose: Nose normal.  ?   Mouth/Throat:  ?   Mouth: Mucous membranes are moist.  ?Eyes:  ?   Extraocular Movements: Extraocular movements intact.  ?   Conjunctiva/sclera: Conjunctivae normal.  ?Cardiovascular:  ?   Rate and Rhythm: Normal rate.  ?Pulmonary:  ?   Effort: Pulmonary effort is normal.  ?   Breath sounds: Normal breath sounds.  ?Abdominal:  ?   General: Abdomen is flat.  ?   Palpations: Abdomen is soft.  ?   Tenderness: There is no abdominal tenderness. There is no guarding.  ?   Comments: Stoma appears healthy, no signs of peristomal hernia or obstruction, the stoma is not protruding like patient is accustomed to  ?Musculoskeletal:     ?   General: No swelling. Normal range of motion.  ?   Cervical back: Neck supple.  ?Skin: ?  General: Skin is warm and dry.  ?Neurological:  ?   General: No focal deficit present.  ?   Mental Status: She is alert.  ?Psychiatric:     ?   Mood and Affect: Mood normal.  ? ? ?ED Results / Procedures / Treatments   ?EKG ?None ? ?Procedures ?Procedures ? ?Medications Ordered in the ED ?Medications - No data to display ? ?Initial Impression and Plan ? Patient with benign exam concerned for change in the appearance of her stoma. Advised to discuss this with her surgeon's office later today when they open. Recommend miralax for constipation. No indication for any ED labs or imaging.  ? ?ED Course  ? ?  ? ? ?MDM Rules/Calculators/A&P ?Medical Decision Making ?Problems Addressed: ?Colostomy complication, unspecified ([place]): acute illness or injury ? ? ? ?Final Clinical Impression(s) / ED Diagnoses ?Final  diagnoses:  ?Colostomy complication, unspecified ([place])  ? ? ?Rx / DC Orders ?ED Discharge Orders   ? ? None  ? ?  ? ?  ?Truddie Hidden, MD ?05/10/21 0301 ? ?

## 2021-05-16 ENCOUNTER — Other Ambulatory Visit (HOSPITAL_COMMUNITY): Payer: Self-pay

## 2021-05-16 ENCOUNTER — Encounter: Payer: Self-pay | Admitting: Hematology

## 2021-05-23 ENCOUNTER — Inpatient Hospital Stay: Payer: 59 | Attending: Hematology

## 2021-05-23 ENCOUNTER — Other Ambulatory Visit: Payer: Self-pay

## 2021-05-23 ENCOUNTER — Other Ambulatory Visit (HOSPITAL_COMMUNITY): Payer: Self-pay

## 2021-05-23 ENCOUNTER — Encounter: Payer: Self-pay | Admitting: Hematology

## 2021-05-23 ENCOUNTER — Inpatient Hospital Stay: Payer: 59 | Admitting: Hematology

## 2021-05-23 ENCOUNTER — Inpatient Hospital Stay: Payer: 59

## 2021-05-23 VITALS — BP 128/96 | HR 81 | Temp 98.8°F | Resp 18 | Ht 66.0 in | Wt 131.7 lb

## 2021-05-23 DIAGNOSIS — C187 Malignant neoplasm of sigmoid colon: Secondary | ICD-10-CM | POA: Diagnosis present

## 2021-05-23 DIAGNOSIS — Z5111 Encounter for antineoplastic chemotherapy: Secondary | ICD-10-CM | POA: Insufficient documentation

## 2021-05-23 DIAGNOSIS — Z79899 Other long term (current) drug therapy: Secondary | ICD-10-CM | POA: Diagnosis not present

## 2021-05-23 DIAGNOSIS — D500 Iron deficiency anemia secondary to blood loss (chronic): Secondary | ICD-10-CM

## 2021-05-23 LAB — FERRITIN: Ferritin: 376 ng/mL — ABNORMAL HIGH (ref 11–307)

## 2021-05-23 LAB — CBC WITH DIFFERENTIAL/PLATELET
Abs Immature Granulocytes: 0 10*3/uL (ref 0.00–0.07)
Basophils Absolute: 0 10*3/uL (ref 0.0–0.1)
Basophils Relative: 0 %
Eosinophils Absolute: 0 10*3/uL (ref 0.0–0.5)
Eosinophils Relative: 1 %
HCT: 33.1 % — ABNORMAL LOW (ref 36.0–46.0)
Hemoglobin: 10.4 g/dL — ABNORMAL LOW (ref 12.0–15.0)
Immature Granulocytes: 0 %
Lymphocytes Relative: 50 %
Lymphs Abs: 1.2 10*3/uL (ref 0.7–4.0)
MCH: 26.4 pg (ref 26.0–34.0)
MCHC: 31.4 g/dL (ref 30.0–36.0)
MCV: 84 fL (ref 80.0–100.0)
Monocytes Absolute: 0.4 10*3/uL (ref 0.1–1.0)
Monocytes Relative: 18 %
Neutro Abs: 0.8 10*3/uL — ABNORMAL LOW (ref 1.7–7.7)
Neutrophils Relative %: 31 %
Platelets: 112 10*3/uL — ABNORMAL LOW (ref 150–400)
RBC: 3.94 MIL/uL (ref 3.87–5.11)
RDW: 19.6 % — ABNORMAL HIGH (ref 11.5–15.5)
WBC: 2.4 10*3/uL — ABNORMAL LOW (ref 4.0–10.5)
nRBC: 0 % (ref 0.0–0.2)

## 2021-05-23 LAB — COMPREHENSIVE METABOLIC PANEL
ALT: 24 U/L (ref 0–44)
AST: 47 U/L — ABNORMAL HIGH (ref 15–41)
Albumin: 4.1 g/dL (ref 3.5–5.0)
Alkaline Phosphatase: 82 U/L (ref 38–126)
Anion gap: 3 — ABNORMAL LOW (ref 5–15)
BUN: 9 mg/dL (ref 6–20)
CO2: 30 mmol/L (ref 22–32)
Calcium: 9.7 mg/dL (ref 8.9–10.3)
Chloride: 108 mmol/L (ref 98–111)
Creatinine, Ser: 0.68 mg/dL (ref 0.44–1.00)
GFR, Estimated: >60 mL/min (ref >60)
Glucose, Bld: 96 mg/dL (ref 70–99)
Potassium: 3.6 mmol/L (ref 3.5–5.1)
Sodium: 141 mmol/L (ref 135–145)
Total Bilirubin: 0.4 mg/dL (ref 0.3–1.2)
Total Protein: 7.7 g/dL (ref 6.5–8.1)

## 2021-05-23 LAB — CEA (IN HOUSE-CHCC): CEA (CHCC-In House): 1.12 ng/mL (ref 0.00–5.00)

## 2021-05-23 MED ORDER — DEXTROSE 5 % IV SOLN: Freq: Once | INTRAVENOUS | Status: AC

## 2021-05-23 MED ORDER — PALONOSETRON HCL INJECTION 0.25 MG/5ML
0.2500 mg | Freq: Once | INTRAVENOUS | Status: AC
  Administered 2021-05-23: 0.25 mg via INTRAVENOUS
  Filled 2021-05-23: qty 5

## 2021-05-23 MED ORDER — OXALIPLATIN CHEMO INJECTION 100 MG/20ML
100.0000 mg/m2 | Freq: Once | INTRAVENOUS | Status: AC
  Administered 2021-05-23: 165 mg via INTRAVENOUS
  Filled 2021-05-23: qty 33

## 2021-05-23 MED ORDER — CAPECITABINE 500 MG PO TABS
ORAL_TABLET | ORAL | 1 refills | Status: DC
  Filled 2021-05-23: qty 84, fill #0
  Filled 2021-06-06: qty 84, 21d supply, fill #0
  Filled 2021-06-28: qty 84, 21d supply, fill #1

## 2021-05-23 MED ORDER — SODIUM CHLORIDE 0.9 % IV SOLN
10.0000 mg | Freq: Once | INTRAVENOUS | Status: AC
  Administered 2021-05-23: 10 mg via INTRAVENOUS
  Filled 2021-05-23: qty 10

## 2021-05-23 NOTE — Progress Notes (Signed)
Nutrition Follow-up: ? ?Patient with adenocarcinoma of sigmoid colon.  S/p partial colectomy with colostomy on 1/19.  Patient on oxaliplatin and xeloda ? ?Met with patient during infusion.  Patient reports that her appetite is good and she is gaining weight.  Has been eating mostly baked chicken, some fish, vegetables, rice almond milk, beans, grits, biscuits, peanut butter crackers, muscle milk.  Drinks mainly water.  Had cake recently for birthday.   ? ?Medications: reviewed ? ?Labs: reviewed ? ?Anthropometrics:  ? ?Weight 131 lb 11.2 oz today ? ?127 lb 8 oz on 3/13 ?126 lb on 2/20 ? ?160 -170 lb UBW ? ? ?NUTRITION DIAGNOSIS: Inadequate oral intake improving ? ? ?INTERVENTION:  ?Continue with well-balanced foods including good sources of protein.  ?Continue muscle milk for added nutrition as does not like ensure/boost shakes ?Continue bowel regimen as needed ?  ? ?MONITORING, EVALUATION, GOAL: weight trends, intake ? ? ?NEXT VISIT: as needed ? ?Barbara Valdez B. Zenia Resides, RD, LDN ?Registered Dietitian ?[phone] [phone] ? ? ?

## 2021-05-23 NOTE — Progress Notes (Signed)
[facility]   Telephone:(336) [phone] Fax:(336) [phone]   Clinic Follow up Note   Patient Care Team: Pcp, No as PCP - General Luretha Murphy, MD as Consulting Physician (General Surgery) Kathi Der, MD as Consulting Physician (Gastroenterology) Malachy Mood, MD as Consulting Physician (Hematology)  Date of Service:  05/23/2021  CHIEF COMPLAINT: f/u of sigmoid colon cancer  CURRENT THERAPY:  Adjuvant CAPOX, starting 03/14/21             -Xeloda dose: 1500 mg in PM and 1000 mg in AM, for 14 days  ASSESSMENT & PLAN:  Barbara Valdez is a 46 y.o. female with   1. 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 Returned with worsening abdominal pain on 02/07/21; CT AP showed severe wall thickening and hyperenhancement involving distal sigmoid colon and rectum, 3 cm abscess at rectosigmoid junction, and one enlarged left external iliac lymph node. She was admitted and underwent colonoscopy on 02/09/21 by Dr. Levora Angel showing partially obstructing tumor in distal sigmoid colon and inflamed mucosa in recto-sigmoid colon. Pathology showed adenomatous lesion with extensive high-grade dysplasia and nonspecific colitis. -She underwent emergent partial colectomy on 02/10/21 under Dr. [name]. 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 Margins and lymph nodes negative. -she began CAPOX on 03/14/21 to reduce her risk of recurrence. She tolerated very well with expected cold sensitivity.  -cycle 2 was delayed by a week due to neutropenia. Her ANC is back down to 0.8 today. We will reduce her dose today and recheck her labs in one week. She will start Xeloda tonight. -she tells me she is scheduled for colonoscopy on 06/21/21, but she expressed concern that this is too soon. I will reach out to her surgeon to see if they feel this is necessary now. -plan for post-treatment  scan in 07/2021.   2.  Iron deficiency anemia -likely secondary to #1 -she received B12 and IV Ferrlecit on 11/29/20 and IV Feraheme on 02/15/21. -overall stable, hgb slightly down to 10.4 today (05/23/21)   3. Genetics -she has a family history of breast cancer, colon cancer, and lymphoma. -testing on 03/28/21 was negative   4. Neutropenia -Secondary to chemotherapy -we will postpone treatment for a week.     PLAN:  -proceed with cycle 4 oxali today at reduced dose due to neutropenia  -start cycle 4 Xeloda tonight at same dose  -Lab in 1 week, for neutropenia f/u  -lab and f/u in 3 weeks, plan to continue Xeloda, plan to increase to 1500mg  q12h from next cycle  -will reach out to Dr. [name] about her next colonoscopy, patient would like to switch her GI care from [facility] to [facility]   No problem-specific Assessment & Plan notes found for this encounter.   SUMMARY OF ONCOLOGIC HISTORY: Oncology History Overview Note   Cancer Staging  Cancer of sigmoid colon [facility]) Staging form: Colon and Rectum, AJCC 8th Edition - Pathologic stage from 02/10/2021: Stage IIB (pT4a, pN0, cM0) - Signed by Malachy Mood, MD on 02/28/2021    Cancer of sigmoid colon (HCC)  11/27/2020 Imaging   CLINICAL DATA:  Abdominal pain that is began Monday. Patient reports seen scant amounts of dark blood in stool. Feels bloated.   EXAM: CT ABDOMEN AND PELVIS WITH CONTRAST  IMPRESSION: 1. Proctocolitis involving the mid and distal sigmoid colon and upper rectum with a 3.9 cm gas containing interloop abscess centered in  the sigmoid mesocolon. 2. Severe fecal burden.  No findings of bowel obstruction. 3. An enlarged left common iliac lymph node is likely reactive. 4. A 2.7 cm hemangioma is noted in the right hepatic lobe. 5. Mild focal bronchiolitis in the right lower lobe.   02/07/2021 Imaging   CLINICAL DATA:  Abdominal pain, cramping, constipation   EXAM: CT ABDOMEN AND PELVIS WITH CONTRAST  IMPRESSION: 1.  There is severe, circumferential wall thickening and mucosal hyperenhancement involving the distal sigmoid colon and rectum to the anus, generally tethered appearance unchanged, involving at least 15-20 cm of distal sigmoid and rectum. 2. There is again seen an abscess at the medial aspect of the rectosigmoid junction measuring approximately 3.0 x 1.6 cm. 3. Findings are generally consistent with nonspecific infectious, inflammatory, or ischemic colitis, including inflammatory bowel disease such as Crohn's disease or ulcerative colitis; however an underlying malignancy is very difficult to exclude, particularly given substantial wall thickening. 4. Large burden of stool throughout the extremely redundant proximal colon, likely reflecting some degree of obstipation. 5. Enlarged left external iliac lymph nodes measuring up to 1.1 x 0.9 cm fall, nonspecific, possibly reactive, however nodal metastatic disease is not excluded per above.   02/09/2021 Initial Biopsy   FINAL MICROSCOPIC DIAGNOSIS:   A. COLON MASS, SIGMOID, BIOPSY:  - Adenomatous lesion with extensive high-grade dysplasia, see comment   B. RECTAL, COLON, SIGMOID, BIOPSY:  - Mild acute/ subacute nonspecific colitis, see comment  - Negative for definite features of chronicity, granulomas or dysplasia   COMMENT:  A.  Evidence of invasive carcinoma is not seen in the submitted biopsies but the biopsy fragments appear superficial and a more severe deeper process cannot be ruled out.  B.  Histologic features in the rectosigmoid biopsies are not compatible with untreated inflammatory bowel disease.  Differential diagnosis can include infection, drug-effect and stercoral proctitis among other possibilities.    02/09/2021 Procedure   Colonoscopy, Dr. Levora Angel  Impression: - Preparation of the colon was fair. - Likely malignant partially obstructing tumor in the distal sigmoid colon. Biopsied. Tattooed. - Congested, inflamed and  thickened folds of the mucosa in the recto-sigmoid colon. Biopsied. Tattooed.   02/10/2021 Cancer Staging   Staging form: Colon and Rectum, AJCC 8th Edition - Pathologic stage from 02/10/2021: Stage IIB (pT4a, pN0, cM0) - Signed by Malachy Mood, MD on 02/28/2021 Stage prefix: Initial diagnosis Total positive nodes: 0 Histologic grading system: 4 grade system Histologic grade (G): G2 Residual tumor (R): R0 - None    02/10/2021 Definitive Surgery   FINAL MICROSCOPIC DIAGNOSIS:   A. COLON, SIGMOID:  - Invasive moderately differentiated adenocarcinoma.  - Twenty-seven lymph nodes, negative for metastatic carcinoma (0/27).  - See oncology table below.   B. COLON, DISTAL, SIGMOID:  - Segment of colon with serosal adhesions, acute inflammation, and tattoo pigment.  - Negative for malignancy.    02/11/2021 Initial Diagnosis   Cancer of sigmoid colon (HCC)    03/14/2021 -  Chemotherapy   Patient is on Treatment Plan : COLORECTAL Xelox (Capeox) q21d       04/20/2021 Genetic Testing   Negative hereditary cancer genetic testing: no pathogenic variants detected in [facility] CustomNext-Cancer +RNAinsight Panel.  Report date is April 20, 2021.   The CustomNext-Cancer+RNAinsight panel offered by [employer] includes sequencing and rearrangement analysis for the following 47 genes:  APC, ATM, AXIN2, BARD1, BMPR1A, BRCA1, BRCA2, BRIP1, CDH1, CDK4, CDKN2A, CHEK2, DICER1, EPCAM, GREM1, HOXB13, MEN1, MLH1, MSH2, MSH3, MSH6, MUTYH, NBN, NF1, NF2, NTHL1,  PALB2, PMS2, POLD1, POLE, PTEN, RAD51C, RAD51D, RECQL, RET, SDHA, SDHAF2, SDHB, SDHC, SDHD, SMAD4, SMARCA4, STK11, TP53, TSC1, TSC2, and VHL.  RNA data is routinely analyzed for use in variant interpretation for all genes.       INTERVAL HISTORY:  Nonna Kutney is here for a follow up of colon cancer. She was last seen by me on 04/25/21. She presents to the clinic accompanied by her mother. She reports she did well with last cycle. She notes she doesn't  drink anything cold.   All other systems were reviewed with the patient and are negative.  MEDICAL HISTORY:  Past Medical History:  Diagnosis Date   Anemia    Anxiety    Complication of anesthesia    took a long time to wake up after surgery   Family history of breast cancer 03/30/2021   Family history of colon cancer 03/30/2021    SURGICAL HISTORY: Past Surgical History:  Procedure Laterality Date   ABDOMINAL HYSTERECTOMY Bilateral 09/26/2016   Procedure: HYSTERECTOMY ABDOMINAL W/ BILATERAL SALPINGECTOMY;  Surgeon: Allie Bossier, MD;  Location: [facility];  Service: Gynecology;  Laterality: Bilateral;   BIOPSY  02/09/2021   Procedure: BIOPSY;  Surgeon: Kathi Der, MD;  Location: [facility];  Service: Gastroenterology;;   BREAST BIOPSY Left    COLONOSCOPY N/A 02/09/2021   Procedure: COLONOSCOPY;  Surgeon: Kathi Der, MD;  Location: [facility];  Service: Gastroenterology;  Laterality: N/A;   LAPAROSCOPIC PARTIAL COLECTOMY N/A 02/10/2021   Procedure: ROBOTIC ASSISTED PARTIAL COLECTOMY WITH COLOSTOMY;  Surgeon: Luretha Murphy, MD;  Location: [facility];  Service: General;  Laterality: N/A;   SUBMUCOSAL TATTOO INJECTION  02/09/2021   Procedure: SUBMUCOSAL TATTOO INJECTION;  Surgeon: Kathi Der, MD;  Location: [facility];  Service: Gastroenterology;;   TUBAL LIGATION      I have reviewed the social history and family history with the patient and they are unchanged from previous note.  ALLERGIES:  is allergic to ferrlecit [na ferric gluc cplx in sucrose].  MEDICATIONS:  Current Outpatient Medications  Medication Sig Dispense Refill   acetaminophen (TYLENOL) 500 MG tablet Take 2 tablets (1,000 mg total) by mouth every 6 (six) hours as needed for mild pain. 30 tablet 0   capecitabine (XELODA) 500 MG tablet Take 2 tabs in morning and 3 tabs in evening,every 12 hours, for 14 days, then off for 7 days. Take after meals. 70 tablet 1   methocarbamol (ROBAXIN) 750 MG tablet Take 1  tablet (750 mg total) by mouth every 8 (eight) hours as needed for muscle spasms. 30 tablet 0   Multiple Vitamin (MULTIVITAMIN WITH MINERALS) TABS tablet Take 1 tablet by mouth daily. 150 tablet 0   ondansetron (ZOFRAN) 8 MG tablet Take 1 tablet (8 mg total) by mouth 2 (two) times daily as needed for refractory nausea / vomiting. Start on day 3 after chemotherapy. 30 tablet 1   pantoprazole (PROTONIX) 40 MG tablet Take 40 mg by mouth daily.     polyethylene glycol (MIRALAX / GLYCOLAX) 17 g packet Take 17 g by mouth daily. 14 each 0   prochlorperazine (COMPAZINE) 10 MG tablet TAKE 1 TABLET BY MOUTH EVERY 6 HOURS AS NEEDED 30 tablet 1   No current facility-administered medications for this visit.   Facility-Administered Medications Ordered in Other Visits  Medication Dose Route Frequency Provider Last Rate Last Admin   oxaliplatin (ELOXATIN) 165 mg in dextrose 5 % 500 mL chemo infusion  100 mg/m2 (Treatment Plan Recorded) Intravenous Once Malachy Mood,  MD 267 mL/hr at 05/23/21 1308 165 mg at 05/23/21 1308    PHYSICAL EXAMINATION: ECOG PERFORMANCE STATUS: 1 - Symptomatic but completely ambulatory  Vitals:   05/23/21 1120  BP: (!) 128/96  Pulse: 81  Resp: 18  Temp: 98.8 F (37.1 C)  SpO2: 100%   Wt Readings from Last 3 Encounters:  05/23/21 131 lb 11.2 oz (59.7 kg)  05/09/21 130 lb 8.2 oz (59.2 kg)  05/03/21 130 lb 8 oz (59.2 kg)     GENERAL:alert, no distress and comfortable SKIN: skin color normal, no rashes or significant lesions EYES: normal, Conjunctiva are pink and non-injected, sclera clear  NEURO: alert & oriented x 3 with fluent speech  LABORATORY DATA:  I have reviewed the data as listed    Latest Ref Rng & Units 05/23/2021   10:43 AM 05/03/2021   12:15 PM 04/25/2021   11:03 AM  CBC  WBC 4.0 - 10.5 K/uL 2.4   3.2   2.3    Hemoglobin 12.0 - 15.0 g/dL 16.1   09.6   04.5    Hematocrit 36.0 - 46.0 % 33.1   35.2   37.6    Platelets 150 - 400 K/uL 112   133   191           Latest Ref Rng & Units 05/23/2021   10:43 AM 05/03/2021   12:15 PM 04/25/2021   11:03 AM  CMP  Glucose 70 - 99 mg/dL 96   84   93    BUN 6 - 20 mg/dL 9   11   11     Creatinine 0.44 - 1.00 mg/dL 4.09   8.11   9.14    Sodium 135 - 145 mmol/L 141   142   142    Potassium 3.5 - 5.1 mmol/L 3.6   3.6   3.9    Chloride 98 - 111 mmol/L 108   107   108    CO2 22 - 32 mmol/L 30   28   26     Calcium 8.9 - 10.3 mg/dL 9.7   9.9   9.7    Total Protein 6.5 - 8.1 g/dL 7.7   8.3   7.9    Total Bilirubin 0.3 - 1.2 mg/dL 0.4   0.5   0.4    Alkaline Phos 38 - 126 U/L 82   88   71    AST 15 - 41 U/L 47   34   35    ALT 0 - 44 U/L 24   18   18         RADIOGRAPHIC STUDIES: I have personally reviewed the radiological images as listed and agreed with the findings in the report. No results found.    No orders of the defined types were placed in this encounter.  All questions were answered. The patient knows to call the clinic with any problems, questions or concerns. No barriers to learning was detected. The total time spent in the appointment was 30 minutes.     Malachy Mood, MD 05/23/2021   I, Mickie Bail, am acting as scribe for Malachy Mood, MD.   I have reviewed the above documentation for accuracy and completeness, and I agree with the above.

## 2021-05-23 NOTE — Patient Instructions (Signed)
Barbara Valdez  Discharge Instructions: Thank you for choosing [facility] to provide your Valdez and hematology care.   If you have a lab appointment with the Cancer Center, please go directly to the Cancer Center and check in at the registration area.   Wear comfortable clothing and clothing appropriate for easy access to any Portacath or PICC line.   We strive to give you quality time with your provider. You may need to reschedule your appointment if you arrive late (15 or more minutes).  Arriving late affects you and other patients whose appointments are after yours.  Also, if you miss three or more appointments without notifying the office, you may be dismissed from the clinic at the provider's discretion.      For prescription refill requests, have your pharmacy contact our office and allow 72 hours for refills to be completed.    Today you received the following chemotherapy and/or immunotherapy agents: Oxaliplatin.       To help prevent nausea and vomiting after your treatment, we encourage you to take your nausea medication as directed.  BELOW ARE SYMPTOMS THAT SHOULD BE REPORTED IMMEDIATELY: *FEVER GREATER THAN 100.4 F (38 C) OR HIGHER *CHILLS OR SWEATING *NAUSEA AND VOMITING THAT IS NOT CONTROLLED WITH YOUR NAUSEA MEDICATION *UNUSUAL SHORTNESS OF BREATH *UNUSUAL BRUISING OR BLEEDING *URINARY PROBLEMS (pain or burning when urinating, or frequent urination) *BOWEL PROBLEMS (unusual diarrhea, constipation, pain near the anus) TENDERNESS IN MOUTH AND THROAT WITH OR WITHOUT PRESENCE OF ULCERS (sore throat, sores in mouth, or a toothache) UNUSUAL RASH, SWELLING OR PAIN  UNUSUAL VAGINAL DISCHARGE OR ITCHING   Items with * indicate a potential emergency and should be followed up as soon as possible or go to the Emergency Department if any problems should occur.  Please show the CHEMOTHERAPY ALERT CARD or IMMUNOTHERAPY ALERT CARD at check-in  to the Emergency Department and triage nurse.  Should you have questions after your visit or need to cancel or reschedule your appointment, please contact [facility]  Dept: [phone]  and follow the prompts.  Office hours are 8:00 a.m. to 4:30 p.m. Monday - Friday. Please note that voicemails left after 4:00 p.m. may not be returned until the following business day.  We are closed weekends and major holidays. You have access to a nurse at all times for urgent questions. Please call the main number to the clinic Dept: [phone] and follow the prompts.   For any non-urgent questions, you may also contact your provider using MyChart. We now offer e-Visits for anyone 18 and older to request care online for non-urgent symptoms. For details visit [email or web address].   Also download the MyChart app! Go to the app store, search "MyChart", open the app, select [place], and log in with your MyChart username and password.  Due to Covid, a mask is required upon entering the hospital/clinic. If you do not have a mask, one will be given to you upon arrival. For doctor visits, patients may have 1 support person aged 18 or older with them. For treatment visits, patients cannot have anyone with them due to current Covid guidelines and our immunocompromised population.   

## 2021-05-23 NOTE — Progress Notes (Signed)
Ok to treat with ANC 0.8 K/uL per Dr Burr Medico ?

## 2021-05-25 ENCOUNTER — Telehealth: Payer: Self-pay

## 2021-05-25 NOTE — Telephone Encounter (Signed)
Called pt to schedule OV. Pt has been scheduled on 06/17/21 @ 150pm, arrival time 130pm. Provided with office location. ?

## 2021-05-25 NOTE — Telephone Encounter (Signed)
-----   Message from Evalee Jefferson, RN sent at 05/25/2021  1:28 PM EDT ----- ?Regarding: Colonoscopy Appt. ?Aloria Looper, I just spoke with the pt at the telephone number below.  I informed her that you have tried contacting her.  She is available right now if you could give her a call.  Thanks! ? ? ?----- Message ----- ?From: Aleatha Borer, LPN ?Sent: 05/25/2021  10:01 AM EDT ?To: Johnathan Hausen, MD, Truitt Merle, MD, # ? ?I have tried to call this pt to try to schedule her an OV as requested. Called [phone] but VM is not set up. I do not have an alternate #. Will continue efforts to reach pt. ? ?Dr. Burr Medico or Dr. Hassell Done - do either of you have an alternate # for this pt? ?----- Message ----- ?From: Sharyn Creamer, MD ?Sent: 05/25/2021   9:48 AM EDT ?To: Johnathan Hausen, MD, Truitt Merle, MD, # ? ?Hi Yan, yes of course. ? ?Joeanna Howdyshell, please get her into clinic with me. I see I have an opening on 5/17 at 2 PM. See if she would be okay with this. ? ?Thanks, ?Lyndee Leo ?----- Message ----- ?From: Truitt Merle, MD ?Sent: 05/24/2021  11:21 PM EDT ?To: Johnathan Hausen, MD, Sharyn Creamer, MD, # ? ?I see, thanks much for letting me know. She if finishing her iv chemo in a few weeks, she still has 3 months of oral chemo, do you plan to do her taking down surgery soon, or OK to wait for 3-4 months?  ? ?She does not want to go back to [facility]. Wilfred Curtis, could you get her in for a colonoscopy in the next month?  ? ?Thanks much, ? ?Krista Blue  ? ?----- Message ----- ?From: Johnathan Hausen, MD ?Sent: 05/24/2021   9:57 PM EDT ?To: Truitt Merle, MD, Evalee Jefferson, RN ? ?Lucianne Lei, ?I wanted her to have a colonoscopy before takedown of her colostomy.  The day before I performed her surgery she had basically a flexible sigmoidoscopy.  She had symptoms since the previous spring and when she was in the hospital in late fall there were attempts to have colonoscopy performed and she was put off.  I think that it would be helpful to have her scoped from below as well as from her  ostomy before we proceed with takedown.   ?Thanks ?Kaylyn Lim ? ? ?----- Message ----- ?From: Truitt Merle, MD ?Sent: 05/23/2021   2:10 PM EDT ?To: Johnathan Hausen, MD, Evalee Jefferson, RN ? ?Dr. Hassell Done, ? ?Do you want her to repeat colonoscopy in June/July? She has colon cancer surgery in Jan 2023, we normally repeat colonoscopy in a year. Pt is under the impression that you want her to have it this summer. I reviewed her last colonoscopy and it did not say was incomplete.  ? ?Thanks  ? ?Krista Blue  ? ? ? ? ? ? ?

## 2021-05-25 NOTE — Telephone Encounter (Signed)
Lft message with Otilio Connors in the [facility] office of [facility] for Dr. Rodman Key B. Hassell Done [phone]) to please give Dr. Burr Medico a call on her cellphone regarding this mutual patient.  Shamera stated she would relay the message to Dr. Hassell Done. ?

## 2021-05-27 ENCOUNTER — Telehealth: Payer: Self-pay | Admitting: Hematology

## 2021-05-27 NOTE — Telephone Encounter (Signed)
Scheduled follow-up appointment per 5/1 los. Patient is aware. ?

## 2021-05-30 ENCOUNTER — Inpatient Hospital Stay: Payer: 59 | Admitting: Hematology

## 2021-05-30 ENCOUNTER — Inpatient Hospital Stay: Payer: 59

## 2021-05-30 ENCOUNTER — Other Ambulatory Visit: Payer: Self-pay

## 2021-05-30 DIAGNOSIS — C187 Malignant neoplasm of sigmoid colon: Secondary | ICD-10-CM

## 2021-05-30 DIAGNOSIS — Z5111 Encounter for antineoplastic chemotherapy: Secondary | ICD-10-CM | POA: Diagnosis not present

## 2021-05-30 LAB — CBC WITH DIFFERENTIAL/PLATELET
Abs Immature Granulocytes: 0.03 10*3/uL (ref 0.00–0.07)
Basophils Absolute: 0 10*3/uL (ref 0.0–0.1)
Basophils Relative: 1 %
Eosinophils Absolute: 0.1 10*3/uL (ref 0.0–0.5)
Eosinophils Relative: 2 %
HCT: 35.2 % — ABNORMAL LOW (ref 36.0–46.0)
Hemoglobin: 10.9 g/dL — ABNORMAL LOW (ref 12.0–15.0)
Immature Granulocytes: 1 %
Lymphocytes Relative: 37 %
Lymphs Abs: 1.5 10*3/uL (ref 0.7–4.0)
MCH: 26 pg (ref 26.0–34.0)
MCHC: 31 g/dL (ref 30.0–36.0)
MCV: 84 fL (ref 80.0–100.0)
Monocytes Absolute: 0.5 10*3/uL (ref 0.1–1.0)
Monocytes Relative: 13 %
Neutro Abs: 1.9 10*3/uL (ref 1.7–7.7)
Neutrophils Relative %: 46 %
Platelets: 51 10*3/uL — ABNORMAL LOW (ref 150–400)
RBC: 4.19 MIL/uL (ref 3.87–5.11)
RDW: 19 % — ABNORMAL HIGH (ref 11.5–15.5)
WBC: 4.1 10*3/uL (ref 4.0–10.5)
nRBC: 0 % (ref 0.0–0.2)

## 2021-05-30 LAB — COMPREHENSIVE METABOLIC PANEL
ALT: 26 U/L (ref 0–44)
AST: 50 U/L — ABNORMAL HIGH (ref 15–41)
Albumin: 4.2 g/dL (ref 3.5–5.0)
Alkaline Phosphatase: 86 U/L (ref 38–126)
Anion gap: 4 — ABNORMAL LOW (ref 5–15)
BUN: 15 mg/dL (ref 6–20)
CO2: 29 mmol/L (ref 22–32)
Calcium: 9.7 mg/dL (ref 8.9–10.3)
Chloride: 109 mmol/L (ref 98–111)
Creatinine, Ser: 0.91 mg/dL (ref 0.44–1.00)
GFR, Estimated: >60 mL/min (ref >60)
Glucose, Bld: 98 mg/dL (ref 70–99)
Potassium: 4.4 mmol/L (ref 3.5–5.1)
Sodium: 142 mmol/L (ref 135–145)
Total Bilirubin: 0.5 mg/dL (ref 0.3–1.2)
Total Protein: 8.3 g/dL — ABNORMAL HIGH (ref 6.5–8.1)

## 2021-06-01 ENCOUNTER — Other Ambulatory Visit (HOSPITAL_COMMUNITY): Payer: Self-pay

## 2021-06-06 ENCOUNTER — Other Ambulatory Visit (HOSPITAL_COMMUNITY): Payer: Self-pay

## 2021-06-08 ENCOUNTER — Encounter: Payer: Self-pay | Admitting: Internal Medicine

## 2021-06-08 ENCOUNTER — Encounter: Payer: Self-pay | Admitting: Hematology

## 2021-06-08 ENCOUNTER — Ambulatory Visit: Payer: 59 | Admitting: Internal Medicine

## 2021-06-08 ENCOUNTER — Other Ambulatory Visit (HOSPITAL_COMMUNITY): Payer: Self-pay

## 2021-06-08 VITALS — BP 100/60 | HR 84 | Ht 65.0 in | Wt 131.0 lb

## 2021-06-08 DIAGNOSIS — Z85038 Personal history of other malignant neoplasm of large intestine: Secondary | ICD-10-CM | POA: Diagnosis not present

## 2021-06-08 DIAGNOSIS — D500 Iron deficiency anemia secondary to blood loss (chronic): Secondary | ICD-10-CM | POA: Diagnosis not present

## 2021-06-08 NOTE — Patient Instructions (Signed)
If you are age 45 or older, your body mass index should be between 23-30. Your Body mass index is 21.8 kg/m?Marland Kitchen If this is out of the aforementioned range listed, please consider follow up with your Primary Care Provider. ? ?If you are age 52 or younger, your body mass index should be between 19-25. Your Body mass index is 21.8 kg/m?Marland Kitchen If this is out of the aformentioned range listed, please consider follow up with your Primary Care Provider.  ? ?Use one Fleet Enema one hour prior to coming for procedure starting at 5am. ? ?You have been scheduled for a colonoscopy. Please follow written instructions given to you at your visit today.  ?Please pick up your prep supplies at the pharmacy within the next 1-3 days. ?If you use inhalers (even only as needed), please bring them with you on the day of your procedure. ? ?The [facility] providers would like to encourage you to use [facility] to communicate with providers for non-urgent requests or questions.  Due to long hold times on the telephone, sending your provider a message by [facility] may be a faster and more efficient way to get a response.  Please allow 48 business hours for a response.  Please remember that this is for non-urgent requests.  ? ?Thank you for entrusting me with your care and for choosing [employer], ?Dr. [name] ? ? ?

## 2021-06-08 NOTE — Progress Notes (Signed)
Chief Complaint: Colonoscopy  HPI : 46 year old female with history of colon cancer s/p partial colectomy and chemotherapy, IDA presents for consideration of colonoscopy  She was diagnosed with colon cancer in 01/2021. Colonoscopy at that time showed a partially obstructing tumor in the distal sigmoid colon. She then underwent sigmoid colectomy with end pouch [name] procedure) on 02/10/21 with final pathology revealing invasive moderately differentiated adenocarcinoma. She underwent chemotherapy after her surgery to reduce her risk of recurrence. Patient has been doing well. Her ostomy has been functioning well. Denies any blood in her ostomy output. Denies abdominal pain. Denies N&V or dysphagia. She is not passing anything from her anus.   Past Medical History:  Diagnosis Date   Anemia    Anxiety    Colon cancer ([place]) 01/2021   stage 2   Complication of anesthesia    took a long time to wake up after surgery   Family history of breast cancer 03/30/2021   Family history of colon cancer 03/30/2021   Past Surgical History:  Procedure Laterality Date   ABDOMINAL HYSTERECTOMY Bilateral 09/26/2016   Procedure: HYSTERECTOMY ABDOMINAL W/ BILATERAL SALPINGECTOMY;  Surgeon: Emily Filbert, MD;  Location: [facility];  Service: Gynecology;  Laterality: Bilateral;   BIOPSY  02/09/2021   Procedure: BIOPSY;  Surgeon: Otis Brace, MD;  Location: [facility];  Service: Gastroenterology;;   BREAST BIOPSY Left    COLONOSCOPY N/A 02/09/2021   Procedure: COLONOSCOPY;  Surgeon: Otis Brace, MD;  Location: [facility];  Service: Gastroenterology;  Laterality: N/A;   LAPAROSCOPIC PARTIAL COLECTOMY N/A 02/10/2021   Procedure: ROBOTIC ASSISTED PARTIAL COLECTOMY WITH COLOSTOMY;  Surgeon: Johnathan Hausen, MD;  Location: [facility];  Service: General;  Laterality: N/A;   SUBMUCOSAL TATTOO INJECTION  02/09/2021   Procedure: SUBMUCOSAL TATTOO INJECTION;  Surgeon: Otis Brace, MD;  Location: [facility];   Service: Gastroenterology;;   TUBAL LIGATION     Family History  Problem Relation Age of Onset   Hyperlipidemia Father    Breast cancer Maternal Aunt    Lymphoma Maternal Aunt    Colon cancer Maternal Uncle 62   Lung cancer Maternal Uncle    Lymphoma Paternal Grandmother        d. 37   Breast cancer Cousin 57       maternal female cousin   Hypertension Other    Cancer Other        PGF's mother; d. early 46s; unknown type; mets   Social History   Tobacco Use   Smoking status: Never   Smokeless tobacco: Never  Vaping Use   Vaping Use: Never used  Substance Use Topics   Alcohol use: Never   Drug use: No   Current Outpatient Medications  Medication Sig Dispense Refill   acetaminophen (TYLENOL) 500 MG tablet Take 2 tablets (1,000 mg total) by mouth every 6 (six) hours as needed for mild pain. 30 tablet 0   capecitabine (XELODA) 500 MG tablet Take 3 tabs in morning and 3 tabs in evening,every 12 hours, for 14 days, then off for 7 days. Take after meals. 84 tablet 1   methocarbamol (ROBAXIN) 750 MG tablet Take 1 tablet (750 mg total) by mouth every 8 (eight) hours as needed for muscle spasms. 30 tablet 0   Multiple Vitamin (MULTIVITAMIN WITH MINERALS) TABS tablet Take 1 tablet by mouth daily. 150 tablet 0   ondansetron (ZOFRAN) 8 MG tablet Take 1 tablet (8 mg total) by mouth 2 (two) times daily as needed for  refractory nausea / vomiting. Start on day 3 after chemotherapy. 30 tablet 1   pantoprazole (PROTONIX) 40 MG tablet Take 40 mg by mouth daily as needed.     polyethylene glycol (MIRALAX / GLYCOLAX) 17 g packet Take 17 g by mouth daily. 14 each 0   prochlorperazine (COMPAZINE) 10 MG tablet TAKE 1 TABLET BY MOUTH EVERY 6 HOURS AS NEEDED 30 tablet 1   No current facility-administered medications for this visit.   Allergies  Allergen Reactions   Ferrlecit [Na Ferric Gluc Cplx In Sucrose] Nausea And Vomiting   Review of Systems: All systems reviewed and negative except where  noted in HPI.   Physical Exam: BP 100/60   Pulse 84   Ht 5' 5"  (1.651 m)   Wt 131 lb (59.4 kg)   LMP 10/23/2016   BMI 21.80 kg/m  Constitutional: Pleasant,well-developed, female in no acute distress. HEENT: Normocephalic and atraumatic. Conjunctivae are normal. No scleral icterus. Cardiovascular: Normal rate, regular rhythm.  Pulmonary/chest: Effort normal and breath sounds normal. No wheezing, rales or rhonchi. Abdominal: Soft, nondistended, nontender. Ostomy present in the LLQ that appears healthy with solid non-bloody stool. Extremities: No edema Neurological: Alert and oriented to person place and time. Skin: Skin is warm and dry. No rashes noted. Psychiatric: Normal mood and affect. Behavior is normal.  Labs 05/2021: CBC with low Hb of 10.9, plts of 51. CMP with mildly elevated AST of 50.  CT A/P w/contrast 02/07/21: IMPRESSION: 1. There is severe, circumferential wall thickening and mucosal hyperenhancement involving the distal sigmoid colon and rectum to the anus, generally tethered appearance unchanged, involving at least 15-20 cm of distal sigmoid and rectum. 2. There is again seen an abscess at the medial aspect of the rectosigmoid junction measuring approximately 3.0 x 1.6 cm. 3. Findings are generally consistent with nonspecific infectious, inflammatory, or ischemic colitis, including inflammatory bowel disease such as Crohn's disease or ulcerative colitis; however an underlying malignancy is very difficult to exclude, particularly given substantial wall thickening. 4. Large burden of stool throughout the extremely redundant proximal colon, likely reflecting some degree of obstipation. 5. Enlarged left external iliac lymph nodes measuring up to 1.1 x 0.9 cm fall, nonspecific, possibly reactive, however nodal metastatic disease is not excluded per above.  Colonoscopy 02/09/21: - Preparation of the colon was fair. - Likely malignant partially obstructing tumor in the  distal sigmoid colon. Biopsied. Tattooed. - Congested, inflamed and thickened folds of the mucosa in the recto-sigmoid colon. Biopsied. Tattooed. Path: A. COLON MASS, SIGMOID, BIOPSY:  - Adenomatous lesion with extensive high-grade dysplasia Evidence of invasive carcinoma is not seen in the submitted biopsies but the biopsy fragments appear superficial and a more severe deeper  process cannot be ruled out.  B. RECTAL, COLON, SIGMOID, BIOPSY:  - Mild acute/ subacute nonspecific colitis - Histologic features in the rectosigmoid biopsies are not compatible with untreated inflammatory bowel disease.  Differential diagnosis can include infection, drug-effect and stercoral proctitis among other possibilities.  - Negative for definite features of chronicity, granulomas or dysplasia   ASSESSMENT AND PLAN: History of colon cancer s/p Hartmann's procedure IDA Thrombocytopenia Patient presents for consideration of colonoscopy prior to reversal of her colostomy after sigmoid colectomy with end pouch creation.  I went over the colonoscopy procedure with the patient, and she is agreeable to proceeding.  I do note that her platelet levels have been downtrending over time, most recently checked as 51,000. Ideally I would like her platelet count to be over 50,000 in preparation  for colonoscopy so I will plan to touch base with her oncologist Dr. Burr Medico to see if there are any options for making sure that her platelets are at an adequate level in preparation for her colonoscopy procedure.  Patient does have iron deficiency anemia, though I expect most of this is due to her recent surgery and chemotherapy for colon cancer. - Will touch base with Dr. Burr Medico regarding thrombocytopenia.  - Colonoscopy [facility], currently scheduled for 5/30 at 7:30 AM. May need to delay her procedure if platelets are too low.  [name], MD

## 2021-06-10 ENCOUNTER — Encounter: Payer: Self-pay | Admitting: Hematology

## 2021-06-10 ENCOUNTER — Encounter: Payer: Self-pay | Admitting: Internal Medicine

## 2021-06-13 ENCOUNTER — Telehealth: Payer: Self-pay | Admitting: Hematology

## 2021-06-13 ENCOUNTER — Inpatient Hospital Stay: Payer: 59 | Admitting: Hematology

## 2021-06-13 ENCOUNTER — Inpatient Hospital Stay: Payer: 59

## 2021-06-13 NOTE — Telephone Encounter (Signed)
Called to sch per 5/22 inb, unable to leave vmail. Pt is mychart active

## 2021-06-15 ENCOUNTER — Ambulatory Visit: Payer: 59 | Admitting: Hematology

## 2021-06-15 ENCOUNTER — Other Ambulatory Visit: Payer: 59

## 2021-06-17 ENCOUNTER — Ambulatory Visit: Payer: 59 | Admitting: Gastroenterology

## 2021-06-17 ENCOUNTER — Telehealth: Payer: Self-pay | Admitting: Internal Medicine

## 2021-06-17 NOTE — Telephone Encounter (Signed)
Called pt and informed of Dr. Libby Maw recommendations. Verbalized acceptance and understanding.

## 2021-06-17 NOTE — Telephone Encounter (Signed)
Inbound call from patient stating she has some questions about what Dr. Lorenso Courier would do about putting a suppository in her rectum the day of her procedure on 5/30. Please advise

## 2021-06-21 ENCOUNTER — Ambulatory Visit (AMBULATORY_SURGERY_CENTER): Payer: 59 | Admitting: Internal Medicine

## 2021-06-21 ENCOUNTER — Encounter: Payer: Self-pay | Admitting: Internal Medicine

## 2021-06-21 VITALS — BP 102/69 | HR 76 | Temp 97.5°F | Resp 22 | Ht 65.0 in | Wt 131.0 lb

## 2021-06-21 DIAGNOSIS — Z85038 Personal history of other malignant neoplasm of large intestine: Secondary | ICD-10-CM

## 2021-06-21 MED ORDER — SODIUM CHLORIDE 0.9 % IV SOLN: 500.0000 mL | INTRAVENOUS | Status: DC

## 2021-06-21 NOTE — Patient Instructions (Signed)
Please read handouts provided. Repeat colonoscopy in one year for screening.   YOU HAD AN ENDOSCOPIC PROCEDURE TODAY AT [facility]:   Refer to the procedure report that was given to you for any specific questions about what was found during the examination.  If the procedure report does not answer your questions, please call your gastroenterologist to clarify.  If you requested that your care partner not be given the details of your procedure findings, then the procedure report has been included in a sealed envelope for you to review at your convenience later.  YOU SHOULD EXPECT: Some feelings of bloating in the abdomen. Passage of more gas than usual.  Walking can help get rid of the air that was put into your GI tract during the procedure and reduce the bloating. If you had a lower endoscopy (such as a colonoscopy or flexible sigmoidoscopy) you may notice spotting of blood in your stool or on the toilet paper. If you underwent a bowel prep for your procedure, you may not have a normal bowel movement for a few days.  Please Note:  You might notice some irritation and congestion in your nose or some drainage.  This is from the oxygen used during your procedure.  There is no need for concern and it should clear up in a day or so.  SYMPTOMS TO REPORT IMMEDIATELY:  Following lower endoscopy (colonoscopy or flexible sigmoidoscopy):  Excessive amounts of blood in the stool  Significant tenderness or worsening of abdominal pains  Swelling of the abdomen that is new, acute  Fever of 100F or higher   For urgent or emergent issues, a gastroenterologist can be reached at any hour by calling [phone]. Do not use MyChart messaging for urgent concerns.    DIET:  We do recommend a small meal at first, but then you may proceed to your regular diet.  Drink plenty of fluids but you should avoid alcoholic beverages for 24 hours.  ACTIVITY:  You should plan to take it easy for the rest  of today and you should NOT DRIVE or use heavy machinery until tomorrow (because of the sedation medicines used during the test).    FOLLOW UP: Our staff will call the number listed on your records 48-72 hours following your procedure to check on you and address any questions or concerns that you may have regarding the information given to you following your procedure. If we do not reach you, we will leave a message.  We will attempt to reach you two times.  During this call, we will ask if you have developed any symptoms of COVID 19. If you develop any symptoms (ie: fever, flu-like symptoms, shortness of breath, cough etc.) before then, please call [phone].  If you test positive for Covid 19 in the 2 weeks post procedure, please call and report this information to [place].    If any biopsies were taken you will be contacted by phone or by letter within the next 1-3 weeks.  Please call us at [phone] if you have not heard about the biopsies in 3 weeks.    SIGNATURES/CONFIDENTIALITY: You and/or your care partner have signed paperwork which will be entered into your electronic medical record.  These signatures attest to the fact that that the information above on your After Visit Summary has been reviewed and is understood.  Full responsibility of the confidentiality of this discharge information lies with you and/or your care-partner.

## 2021-06-21 NOTE — Progress Notes (Signed)
A and O x3. Report to RN. Tolerated MAC anesthesia well. 

## 2021-06-21 NOTE — Progress Notes (Signed)
GASTROENTEROLOGY PROCEDURE H&P NOTE   Primary Care Physician: Pcp, No    Reason for Procedure:   History of colon cancer  Plan:    Colonoscopy  Patient is appropriate for endoscopic procedure(s) in the ambulatory ([place]) setting.  The nature of the procedure, as well as the risks, benefits, and alternatives were carefully and thoroughly reviewed with the patient. Ample time for discussion and questions allowed. The patient understood, was satisfied, and agreed to proceed.     HPI: Barbara Valdez is a 46 y.o. female who presents for colonoscopy for evaluation of history of colon cancer .  Patient was most recently seen in the Gastroenterology Clinic on 06/08/21.  No interval change in medical history since that appointment. Please refer to that note for full details regarding GI history and clinical presentation.   Past Medical History:  Diagnosis Date   Anemia    Anxiety    Colon cancer ([place]) 01/2021   stage 2   Complication of anesthesia    took a long time to wake up after surgery   Family history of breast cancer 03/30/2021   Family history of colon cancer 03/30/2021    Past Surgical History:  Procedure Laterality Date   ABDOMINAL HYSTERECTOMY Bilateral 09/26/2016   Procedure: HYSTERECTOMY ABDOMINAL W/ BILATERAL SALPINGECTOMY;  Surgeon: Emily Filbert, MD;  Location: [facility];  Service: Gynecology;  Laterality: Bilateral;   BIOPSY  02/09/2021   Procedure: BIOPSY;  Surgeon: Otis Brace, MD;  Location: [facility];  Service: Gastroenterology;;   BREAST BIOPSY Left    COLONOSCOPY N/A 02/09/2021   Procedure: COLONOSCOPY;  Surgeon: Otis Brace, MD;  Location: [facility];  Service: Gastroenterology;  Laterality: N/A;   LAPAROSCOPIC PARTIAL COLECTOMY N/A 02/10/2021   Procedure: ROBOTIC ASSISTED PARTIAL COLECTOMY WITH COLOSTOMY;  Surgeon: Johnathan Hausen, MD;  Location: [facility];  Service: General;  Laterality: N/A;   SUBMUCOSAL TATTOO INJECTION  02/09/2021   Procedure:  SUBMUCOSAL TATTOO INJECTION;  Surgeon: Otis Brace, MD;  Location: [facility];  Service: Gastroenterology;;   TUBAL LIGATION      Prior to Admission medications   Medication Sig Start Date End Date Taking? Authorizing Provider  capecitabine (XELODA) 500 MG tablet Take 3 tabs in morning and 3 tabs in evening,every 12 hours, for 14 days, then off for 7 days. Take after meals. 05/23/21  Yes Truitt Merle, MD  Multiple Vitamin (MULTIVITAMIN WITH MINERALS) TABS tablet Take 1 tablet by mouth daily. 02/16/21  Yes Lavina Hamman, MD  polyethylene glycol (MIRALAX / GLYCOLAX) 17 g packet Take 17 g by mouth daily. 02/16/21  Yes Lavina Hamman, MD  acetaminophen (TYLENOL) 500 MG tablet Take 2 tablets (1,000 mg total) by mouth every 6 (six) hours as needed for mild pain. Patient not taking: Reported on 06/21/2021 02/15/21   Wellington Hampshire, PA-C  methocarbamol (ROBAXIN) 750 MG tablet Take 1 tablet (750 mg total) by mouth every 8 (eight) hours as needed for muscle spasms. 02/15/21   Meuth, Brooke A, PA-C  ondansetron (ZOFRAN) 8 MG tablet Take 1 tablet (8 mg total) by mouth 2 (two) times daily as needed for refractory nausea / vomiting. Start on day 3 after chemotherapy. 03/14/21   Truitt Merle, MD  pantoprazole (PROTONIX) 40 MG tablet Take 40 mg by mouth daily as needed. Patient not taking: Reported on 06/21/2021 05/01/21   [provider]  prochlorperazine (COMPAZINE) 10 MG tablet TAKE 1 TABLET BY MOUTH EVERY 6 HOURS AS NEEDED 04/16/21   Truitt Merle, MD  Current Outpatient Medications  Medication Sig Dispense Refill   capecitabine (XELODA) 500 MG tablet Take 3 tabs in morning and 3 tabs in evening,every 12 hours, for 14 days, then off for 7 days. Take after meals. 84 tablet 1   Multiple Vitamin (MULTIVITAMIN WITH MINERALS) TABS tablet Take 1 tablet by mouth daily. 150 tablet 0   polyethylene glycol (MIRALAX / GLYCOLAX) 17 g packet Take 17 g by mouth daily. 14 each 0   acetaminophen (TYLENOL) 500 MG tablet  Take 2 tablets (1,000 mg total) by mouth every 6 (six) hours as needed for mild pain. (Patient not taking: Reported on 06/21/2021) 30 tablet 0   methocarbamol (ROBAXIN) 750 MG tablet Take 1 tablet (750 mg total) by mouth every 8 (eight) hours as needed for muscle spasms. 30 tablet 0   ondansetron (ZOFRAN) 8 MG tablet Take 1 tablet (8 mg total) by mouth 2 (two) times daily as needed for refractory nausea / vomiting. Start on day 3 after chemotherapy. 30 tablet 1   pantoprazole (PROTONIX) 40 MG tablet Take 40 mg by mouth daily as needed. (Patient not taking: Reported on 06/21/2021)     prochlorperazine (COMPAZINE) 10 MG tablet TAKE 1 TABLET BY MOUTH EVERY 6 HOURS AS NEEDED 30 tablet 1   Current Facility-Administered Medications  Medication Dose Route Frequency Provider Last Rate Last Admin   0.9 %  sodium chloride infusion  500 mL Intravenous Continuous Sharyn Creamer, MD        Allergies as of 06/21/2021 - Review Complete 06/21/2021  Allergen Reaction Noted   Ferrlecit [na ferric gluc cplx in sucrose] Nausea And Vomiting 02/15/2021    Family History  Problem Relation Age of Onset   Hyperlipidemia Father    Breast cancer Maternal Aunt    Lymphoma Maternal Aunt    Colon cancer Maternal Uncle 62   Lung cancer Maternal Uncle    Lymphoma Paternal Grandmother        d. 52   Breast cancer Cousin 49       maternal female cousin   Hypertension Other    Cancer Other        PGF's mother; d. early 65s; unknown type; mets    Social History   Socioeconomic History   Marital status: Single    Spouse name: Not on file   Number of children: 2   Years of education: Not on file   Highest education level: Not on file  Occupational History   Not on file  Tobacco Use   Smoking status: Never   Smokeless tobacco: Never  Vaping Use   Vaping Use: Never used  Substance and Sexual Activity   Alcohol use: Never   Drug use: No   Sexual activity: Not Currently    Partners: Male    Birth  control/protection: Surgical  Other Topics Concern   Not on file  Social History Narrative   Not on file   Social Determinants of Health   Financial Resource Strain: Not on file  Food Insecurity: Not on file  Transportation Needs: Not on file  Physical Activity: Not on file  Stress: Not on file  Social Connections: Not on file  Intimate Partner Violence: Not on file    Physical Exam: Vital signs in last 24 hours: BP 108/73   Pulse 80   Temp (!) 97.5 F (36.4 C)   Resp (!) 9   Ht 5' 5"  (1.651 m)   Wt 131 lb (59.4 kg)   LMP 10/23/2016  SpO2 100%   BMI 21.80 kg/m  GEN: NAD EYE: Sclerae anicteric ENT: MMM CV: Non-tachycardic Pulm: No increased WOB GI: Soft NEURO:  Alert & Oriented   [name], MD [facility]   06/21/2021 7:49 AM

## 2021-06-21 NOTE — Op Note (Signed)
[place] Patient Name: Barbara Valdez Procedure Date: 06/21/2021 7:36 AM MRN: [id number] Endoscopist: Sonny Masters "Barbara Valdez ,  Age: 46 Referring MD:  Date of Birth: Jan 22, 1976 Gender: Female Account #: [id number] Procedure:                Colonoscopy Indications:              High risk colon cancer surveillance: Personal                            history of colon cancer Medicines:                Monitored Anesthesia Care Procedure:                Pre-Anesthesia Assessment:                           - Prior to the procedure, a History and Physical                            was performed, and patient medications and                            allergies were reviewed. The patient's tolerance of                            previous anesthesia was also reviewed. The risks                            and benefits of the procedure and the sedation                            options and risks were discussed with the patient.                            All questions were answered, and informed consent                            was obtained. Prior Anticoagulants: The patient has                            taken no previous anticoagulant or antiplatelet                            agents. ASA Grade Assessment: II - A patient with                            mild systemic disease. After reviewing the risks                            and benefits, the patient was deemed in                            satisfactory condition to undergo the procedure.  After obtaining informed consent, the colonoscope                            was passed under direct vision. Throughout the                            procedure, the patient's blood pressure, pulse, and                            oxygen saturations were monitored continuously. The                            CF [id number] #[id number] was introduced through the                            descending colostomy and advanced to  the the                            terminal ileum. The scope was then taken out and                            introduced through the rectum and advanced to the                            end of the rectal stump. The colonoscopy was                            performed without difficulty. The patient tolerated                            the procedure well. The quality of the bowel                            preparation was good. The terminal ileum, ileocecal                            valve, appendiceal orifice, and rectum were                            photographed. Rectal retroflexion was not performed                            due to limited space within the rectum. Scope In: 7:52:23 AM Scope Out: 8:07:31 AM Scope Withdrawal Time: 0 hours 7 minutes 37 seconds  Total Procedure Duration: 0 hours 15 minutes 8 seconds  Findings:                 The terminal ileum appeared normal.                           A tattoo was seen in the rectum. The tattoo site                            appeared normal.  The exam was otherwise without abnormality. Complications:            No immediate complications. Estimated Blood Loss:     Estimated blood loss: none. Impression:               - The examined portion of the ileum was normal.                           - A tattoo was seen in the rectum. The tattoo site                            appeared normal.                           - The examination was otherwise normal.                           - No specimens collected. Recommendation:           - Discharge patient to home (with escort).                           - Repeat colonoscopy in 1 year for surveillance                            based on personal history of colon cancer.                           - The findings and recommendations were discussed                            with the patient. Sonny Masters "Barbara Valdez,  06/21/2021 8:20:17 AM

## 2021-06-22 ENCOUNTER — Telehealth: Payer: Self-pay | Admitting: *Deleted

## 2021-06-22 NOTE — Telephone Encounter (Signed)
  Follow up Call-     06/21/2021    7:18 AM  Call back number  Post procedure Call Back phone  # [phone]  Permission to leave phone message Yes     Patient questions:  Do you have a fever, pain , or abdominal swelling? No. Pain Score  0 *  Have you tolerated food without any problems? Yes.    Have you been able to return to your normal activities? Yes.    Do you have any questions about your discharge instructions: Diet   No. Medications  No. Follow up visit  No.  Do you have questions or concerns about your Care? No.  Actions: * If pain score is 4 or above: No action needed, pain <4.

## 2021-06-28 ENCOUNTER — Other Ambulatory Visit: Payer: Self-pay

## 2021-06-28 ENCOUNTER — Other Ambulatory Visit (HOSPITAL_COMMUNITY): Payer: Self-pay

## 2021-06-28 ENCOUNTER — Encounter: Payer: Self-pay | Admitting: Pharmacist

## 2021-06-28 MED ORDER — CAPECITABINE 500 MG PO TABS
ORAL_TABLET | ORAL | 1 refills | Status: DC
Start: 2021-06-28 — End: 2021-07-22

## 2021-06-28 NOTE — Progress Notes (Signed)
Erroneous Encounter

## 2021-06-29 ENCOUNTER — Telehealth: Payer: Self-pay | Admitting: Pharmacy Technician

## 2021-06-29 ENCOUNTER — Other Ambulatory Visit (HOSPITAL_COMMUNITY): Payer: Self-pay

## 2021-06-29 NOTE — Telephone Encounter (Signed)
Oral Oncology Patient Advocate Encounter   Received notification that prior authorization for Capecitabine (Xeloda) is required.   PA submitted on 06/29/2021 Key [id number] Status is pending     Lady Deutscher, CPhT-Adv Pharmacy Patient Advocate Specialist [place] Patient Advocate Team Direct Number: [phone]  Fax: [phone]

## 2021-06-29 NOTE — Telephone Encounter (Signed)
Oral Oncology Patient Advocate Encounter  Prior Authorization for Capecitabine (Xeloda) has been approved.    PA# [id number] Effective dates: 06/29/2021 through 06/30/2022  Patient must fill at [facility] [phone].    Lady Deutscher, CPhT-Adv Pharmacy Patient Advocate Specialist [place] Patient Advocate Team Direct Number: [phone]  Fax: [phone]

## 2021-07-03 NOTE — Progress Notes (Unsigned)
[place]   [phone] [phone]   Clinic Follow up Note   Patient Care Team: Pcp, No as PCP - General Johnathan Hausen, MD as Consulting Physician (General Surgery) Otis Brace, MD as Consulting Physician (Gastroenterology) Truitt Merle, MD as Consulting Physician (Hematology) 07/04/2021  I connected with Barbara Valdez on 07/04/21 at 11:30 AM EDT by telephone visit and verified that I am speaking with the correct person using two identifiers.   I discussed the limitations, risks, security and privacy concerns of performing an evaluation and management service by telemedicine and the availability of in-person appointments. I also discussed with the patient that there may be a patient responsible charge related to this service. The patient expressed understanding and agreed to proceed.   Other persons participating in the visit and their role in the encounter: none    Patient's location: home  Provider's location: [place] office    CHIEF COMPLAINT: Follow up colon cancer   SUMMARY OF ONCOLOGIC HISTORY: Oncology History Overview Note   Cancer Staging  Cancer of sigmoid colon [facility]) Staging form: Colon and Rectum, AJCC 8th Edition - Pathologic stage from 02/10/2021: Stage IIB (pT4a, pN0, cM0) - Signed by Truitt Merle, MD on 02/28/2021    Cancer of sigmoid colon ([place])  11/27/2020 Imaging   CLINICAL DATA:  Abdominal pain that is began Monday. Patient reports seen scant amounts of dark blood in stool. Feels bloated.   EXAM: CT ABDOMEN AND PELVIS WITH CONTRAST  IMPRESSION: 1. Proctocolitis involving the mid and distal sigmoid colon and upper rectum with a 3.9 cm gas containing interloop abscess centered in the sigmoid mesocolon. 2. Severe fecal burden.  No findings of bowel obstruction. 3. An enlarged left common iliac lymph node is likely reactive. 4. A 2.7 cm hemangioma is noted in the right hepatic lobe. 5. Mild focal bronchiolitis in the right  lower lobe.   02/07/2021 Imaging   CLINICAL DATA:  Abdominal pain, cramping, constipation   EXAM: CT ABDOMEN AND PELVIS WITH CONTRAST  IMPRESSION: 1. There is severe, circumferential wall thickening and mucosal hyperenhancement involving the distal sigmoid colon and rectum to the anus, generally tethered appearance unchanged, involving at least 15-20 cm of distal sigmoid and rectum. 2. There is again seen an abscess at the medial aspect of the rectosigmoid junction measuring approximately 3.0 x 1.6 cm. 3. Findings are generally consistent with nonspecific infectious, inflammatory, or ischemic colitis, including inflammatory bowel disease such as Crohn's disease or ulcerative colitis; however an underlying malignancy is very difficult to exclude, particularly given substantial wall thickening. 4. Large burden of stool throughout the extremely redundant proximal colon, likely reflecting some degree of obstipation. 5. Enlarged left external iliac lymph nodes measuring up to 1.1 x 0.9 cm fall, nonspecific, possibly reactive, however nodal metastatic disease is not excluded per above.   02/09/2021 Initial Biopsy   FINAL MICROSCOPIC DIAGNOSIS:   A. COLON MASS, SIGMOID, BIOPSY:  - Adenomatous lesion with extensive high-grade dysplasia, see comment   B. RECTAL, COLON, SIGMOID, BIOPSY:  - Mild acute/ subacute nonspecific colitis, see comment  - Negative for definite features of chronicity, granulomas or dysplasia   COMMENT:  A.  Evidence of invasive carcinoma is not seen in the submitted biopsies but the biopsy fragments appear superficial and a more severe deeper process cannot be ruled out.  B.  Histologic features in the rectosigmoid biopsies are not compatible with untreated inflammatory bowel disease.  Differential diagnosis can include infection, drug-effect and stercoral proctitis among other  possibilities.    02/09/2021 Procedure   Colonoscopy, Dr. Alessandra Bevels  Impression: -  Preparation of the colon was fair. - Likely malignant partially obstructing tumor in the distal sigmoid colon. Biopsied. Tattooed. - Congested, inflamed and thickened folds of the mucosa in the recto-sigmoid colon. Biopsied. Tattooed.   02/10/2021 Cancer Staging   Staging form: Colon and Rectum, AJCC 8th Edition - Pathologic stage from 02/10/2021: Stage IIB (pT4a, pN0, cM0) - Signed by Truitt Merle, MD on 02/28/2021 Stage prefix: Initial diagnosis Total positive nodes: 0 Histologic grading system: 4 grade system Histologic grade (G): G2 Residual tumor (R): R0 - None   02/10/2021 Definitive Surgery   FINAL MICROSCOPIC DIAGNOSIS:   A. COLON, SIGMOID:  - Invasive moderately differentiated adenocarcinoma.  - Twenty-seven lymph nodes, negative for metastatic carcinoma (0/27).  - See oncology table below.   B. COLON, DISTAL, SIGMOID:  - Segment of colon with serosal adhesions, acute inflammation, and tattoo pigment.  - Negative for malignancy.    02/11/2021 Initial Diagnosis   Cancer of sigmoid colon ([place])   03/14/2021 -  Chemotherapy   Patient is on Treatment Plan : COLORECTAL Xelox (Capeox) q21d     04/20/2021 Genetic Testing   Negative hereditary cancer genetic testing: no pathogenic variants detected in [facility] CustomNext-Cancer +RNAinsight Panel.  Report date is April 20, 2021.   The CustomNext-Cancer+RNAinsight panel offered by [facility] includes sequencing and rearrangement analysis for the following 47 genes:  APC, ATM, AXIN2, BARD1, BMPR1A, BRCA1, BRCA2, BRIP1, CDH1, CDK4, CDKN2A, CHEK2, DICER1, EPCAM, GREM1, HOXB13, MEN1, MLH1, MSH2, MSH3, MSH6, MUTYH, NBN, NF1, NF2, NTHL1, PALB2, PMS2, POLD1, POLE, PTEN, RAD51C, RAD51D, RECQL, RET, SDHA, SDHAF2, SDHB, SDHC, SDHD, SMAD4, SMARCA4, STK11, TP53, TSC1, TSC2, and VHL.  RNA data is routinely analyzed for use in variant interpretation for all genes.      CURRENT THERAPY: Adjuvant CAPOX, starting 02/2021 (Xeloda 1500 mg PM and 1000 mg AM  for 2 weeks on/1 week off)   Please to increase Xeloda to 1500 mg BID today 07/04/21  INTERVAL HISTORY:Barbara Valdez presents by phone. She came in this morning for lab but left and didn't know she had a provider visit. Last seen by Dr. Burr Medico 05/23/21 and completed cycle 4 CAPOX.  She had a colonoscopy 06/21/2021 and has recovered well.  She plans to begin Xeloda today.  She recovered well from last chemo with no residual cold sensitivity or neuropathy.  Feet are black.  She gets an "achiness" in the middle of her chest on the last day or 2 of Xeloda pills, then goes away. Doesn't think it is reflux.  Not associated with cough, shortness of breath, exertion, dizziness, nausea/vomiting or any other changes.  This has occurred with each chemo cycle.  She is otherwise doing well, eating and drinking well, energy is adequate.  Bowels moving well.  Denies hand/foot syndrome, fever, chills, pain, or any other new specific complaints.   MEDICAL HISTORY:  Past Medical History:  Diagnosis Date   Anemia    Anxiety    Colon cancer ([place]) 01/2021   stage 2   Complication of anesthesia    took a long time to wake up after surgery   Family history of breast cancer 03/30/2021   Family history of colon cancer 03/30/2021    SURGICAL HISTORY: Past Surgical History:  Procedure Laterality Date   ABDOMINAL HYSTERECTOMY Bilateral 09/26/2016   Procedure: HYSTERECTOMY ABDOMINAL W/ BILATERAL SALPINGECTOMY;  Surgeon: Emily Filbert, MD;  Location: [facility];  Service: Gynecology;  Laterality: Bilateral;   BIOPSY  02/09/2021   Procedure: BIOPSY;  Surgeon: Otis Brace, MD;  Location: [facility];  Service: Gastroenterology;;   BREAST BIOPSY Left    COLONOSCOPY N/A 02/09/2021   Procedure: COLONOSCOPY;  Surgeon: Otis Brace, MD;  Location: [facility];  Service: Gastroenterology;  Laterality: N/A;   LAPAROSCOPIC PARTIAL COLECTOMY N/A 02/10/2021   Procedure: ROBOTIC ASSISTED PARTIAL COLECTOMY WITH COLOSTOMY;  Surgeon:  Johnathan Hausen, MD;  Location: [facility];  Service: General;  Laterality: N/A;   SUBMUCOSAL TATTOO INJECTION  02/09/2021   Procedure: SUBMUCOSAL TATTOO INJECTION;  Surgeon: Otis Brace, MD;  Location: [facility];  Service: Gastroenterology;;   TUBAL LIGATION      I have reviewed the social history and family history with the patient and they are unchanged from previous note.  ALLERGIES:  is allergic to ferrlecit [na ferric gluc cplx in sucrose].  MEDICATIONS:  Current Outpatient Medications  Medication Sig Dispense Refill   acetaminophen (TYLENOL) 500 MG tablet Take 2 tablets (1,000 mg total) by mouth every 6 (six) hours as needed for mild pain. (Patient not taking: Reported on 06/21/2021) 30 tablet 0   capecitabine (XELODA) 500 MG tablet Take 3 tabs in morning and 3 tabs in evening,every 12 hours, for 14 days, then off for 7 days. Take after meals. 84 tablet 1   methocarbamol (ROBAXIN) 750 MG tablet Take 1 tablet (750 mg total) by mouth every 8 (eight) hours as needed for muscle spasms. 30 tablet 0   Multiple Vitamin (MULTIVITAMIN WITH MINERALS) TABS tablet Take 1 tablet by mouth daily. 150 tablet 0   ondansetron (ZOFRAN) 8 MG tablet Take 1 tablet (8 mg total) by mouth 2 (two) times daily as needed for refractory nausea / vomiting. Start on day 3 after chemotherapy. 30 tablet 1   pantoprazole (PROTONIX) 40 MG tablet Take 40 mg by mouth daily as needed. (Patient not taking: Reported on 06/21/2021)     polyethylene glycol (MIRALAX / GLYCOLAX) 17 g packet Take 17 g by mouth daily. 14 each 0   prochlorperazine (COMPAZINE) 10 MG tablet TAKE 1 TABLET BY MOUTH EVERY 6 HOURS AS NEEDED 30 tablet 1   No current facility-administered medications for this visit.    PHYSICAL EXAMINATION: ECOG PERFORMANCE STATUS: 0 - Asymptomatic  There were no vitals filed for this visit. There were no vitals filed for this visit.  Patient appears well over the phone. Mood/affect appear normal. Voice is clear,  speech is strong. No cough or conversational dyspnea  LABORATORY DATA:  I have reviewed the data as listed    Latest Ref Rng & Units 07/04/2021   11:03 AM 05/30/2021    2:49 PM 05/23/2021   10:43 AM  CBC  WBC 4.0 - 10.5 K/uL 2.4  4.1  2.4   Hemoglobin 12.0 - 15.0 g/dL 10.9  10.9  10.4   Hematocrit 36.0 - 46.0 % 34.6  35.2  33.1   Platelets 150 - 400 K/uL 263  51  112         Latest Ref Rng & Units 07/04/2021   11:03 AM 05/30/2021    2:49 PM 05/23/2021   10:43 AM  CMP  Glucose 70 - 99 mg/dL 94  98  96   BUN 6 - 20 mg/dL 15  15  9    Creatinine 0.44 - 1.00 mg/dL 0.84  0.91  0.68   Sodium 135 - 145 mmol/L 141  142  141   Potassium 3.5 - 5.1 mmol/L 3.9  4.4  3.6   Chloride 98 - 111 mmol/L 106  109  108   CO2 22 - 32 mmol/L 30  29  30    Calcium 8.9 - 10.3 mg/dL 10.0  9.7  9.7   Total Protein 6.5 - 8.1 g/dL 8.0  8.3  7.7   Total Bilirubin 0.3 - 1.2 mg/dL 0.5  0.5  0.4   Alkaline Phos 38 - 126 U/L 98  86  82   AST 15 - 41 U/L 41  50  47   ALT 0 - 44 U/L 21  26  24        RADIOGRAPHIC STUDIES: I have personally reviewed the radiological images as listed and agreed with the findings in the report. No results found.   ASSESSMENT & PLAN: Barbara Valdez is a 46 y.o. female with    1. 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 Returned with worsening abdominal pain on 02/07/21; CT AP showed severe wall thickening and hyperenhancement involving distal sigmoid colon and rectum, 3 cm abscess at rectosigmoid junction, and one enlarged left external iliac lymph node. She was admitted and underwent colonoscopy on 02/09/21 by Dr. Alessandra Bevels showing partially obstructing tumor in distal sigmoid colon and inflamed mucosa in recto-sigmoid colon. Pathology showed adenomatous lesion with extensive high-grade dysplasia and nonspecific colitis. -She underwent emergent partial colectomy on 02/10/21 under Dr. Hassell Done. Pathology showed 9.5  cm invasive moderately differentiated adenocarcinoma to sigmoid colon. Margins and lymph nodes negative. -she completed 4 cycles of adjuvant CAPOX  03/14/21 -05/23/21 to reduce her risk of recurrence. She tolerated very well.  Her main side effects are skin discoloration and neutropenia.  She notes an achiness in the middle of her chest at the end of the 2 weeks on Xeloda, no other associated symptoms, and resolves on its own after she stops.  -She underwent colonoscopy 06/21/2020 which was unremarkable, no specimens were obtained.  Plan to repeat in 1 year (Dr. [name]) -plan for post-treatment scan in 07/2021. -The plan is to continue 3 months of single agent Xeloda, increased dose to 1500 mg twice daily for 2 weeks on/1 week off starting today 6/12.   -Labs reviewed, ANC 0.8, adequate to proceed.  Repeat next week -Follow-up 6/29 or 6/30 prior to next cycle   2.  Iron deficiency anemia -likely secondary to #1 -she received B12 and IV Ferrlecit on 11/29/20 and IV Feraheme on 02/15/21. -overall stable in 10 range lately   3. Genetics -she has a family history of breast cancer, colon cancer, and lymphoma. -testing on 03/28/21 was negative   4. Neutropenia -Secondary to chemotherapy -reviewed precautions   PLAN: -Colonoscopy and today's labs reviewed -Proceed with 3 months of Xeloda starting today as planned, 1500 mg twice daily for 2 weeks on/1 week off -CBC next week to monitor neutropenia, continue precautions -Follow-up 6/29 or 6/30 prior to next cycle -Restage in July   No barriers to learning were detected. I discussed the assessment and treatment plan with the patient. The patient was provided an opportunity to ask questions and all were answered. The patient agreed with the plan and demonstrated an understanding of the instructions.   The patient was advised to call back or seek an in-person evaluation if the symptoms worsen or if the condition fails to improve as anticipated. A  total of 10 minutes spent on today's phone call.      Alla Feeling, NP  07/04/21     

## 2021-07-04 ENCOUNTER — Other Ambulatory Visit: Payer: Self-pay

## 2021-07-04 ENCOUNTER — Inpatient Hospital Stay: Payer: 59 | Attending: Hematology

## 2021-07-04 ENCOUNTER — Encounter: Payer: Self-pay | Admitting: Nurse Practitioner

## 2021-07-04 ENCOUNTER — Inpatient Hospital Stay (HOSPITAL_BASED_OUTPATIENT_CLINIC_OR_DEPARTMENT_OTHER): Payer: 59 | Admitting: Nurse Practitioner

## 2021-07-04 DIAGNOSIS — D701 Agranulocytosis secondary to cancer chemotherapy: Secondary | ICD-10-CM

## 2021-07-04 DIAGNOSIS — D509 Iron deficiency anemia, unspecified: Secondary | ICD-10-CM | POA: Insufficient documentation

## 2021-07-04 DIAGNOSIS — Z79899 Other long term (current) drug therapy: Secondary | ICD-10-CM | POA: Diagnosis not present

## 2021-07-04 DIAGNOSIS — Z800 Family history of malignant neoplasm of digestive organs: Secondary | ICD-10-CM | POA: Insufficient documentation

## 2021-07-04 DIAGNOSIS — C187 Malignant neoplasm of sigmoid colon: Secondary | ICD-10-CM | POA: Diagnosis present

## 2021-07-04 DIAGNOSIS — Z9049 Acquired absence of other specified parts of digestive tract: Secondary | ICD-10-CM | POA: Insufficient documentation

## 2021-07-04 DIAGNOSIS — T451X5A Adverse effect of antineoplastic and immunosuppressive drugs, initial encounter: Secondary | ICD-10-CM | POA: Diagnosis not present

## 2021-07-04 DIAGNOSIS — R202 Paresthesia of skin: Secondary | ICD-10-CM | POA: Diagnosis not present

## 2021-07-04 DIAGNOSIS — Z803 Family history of malignant neoplasm of breast: Secondary | ICD-10-CM | POA: Insufficient documentation

## 2021-07-04 DIAGNOSIS — Z807 Family history of other malignant neoplasms of lymphoid, hematopoietic and related tissues: Secondary | ICD-10-CM | POA: Insufficient documentation

## 2021-07-04 DIAGNOSIS — D500 Iron deficiency anemia secondary to blood loss (chronic): Secondary | ICD-10-CM

## 2021-07-04 LAB — COMPREHENSIVE METABOLIC PANEL
ALT: 21 U/L (ref 0–44)
AST: 41 U/L (ref 15–41)
Albumin: 4.3 g/dL (ref 3.5–5.0)
Alkaline Phosphatase: 98 U/L (ref 38–126)
Anion gap: 5 (ref 5–15)
BUN: 15 mg/dL (ref 6–20)
CO2: 30 mmol/L (ref 22–32)
Calcium: 10 mg/dL (ref 8.9–10.3)
Chloride: 106 mmol/L (ref 98–111)
Creatinine, Ser: 0.84 mg/dL (ref 0.44–1.00)
GFR, Estimated: >60 mL/min (ref >60)
Glucose, Bld: 94 mg/dL (ref 70–99)
Potassium: 3.9 mmol/L (ref 3.5–5.1)
Sodium: 141 mmol/L (ref 135–145)
Total Bilirubin: 0.5 mg/dL (ref 0.3–1.2)
Total Protein: 8 g/dL (ref 6.5–8.1)

## 2021-07-04 LAB — CBC WITH DIFFERENTIAL/PLATELET
Abs Immature Granulocytes: 0.01 10*3/uL (ref 0.00–0.07)
Basophils Absolute: 0 10*3/uL (ref 0.0–0.1)
Basophils Relative: 1 %
Eosinophils Absolute: 0 10*3/uL (ref 0.0–0.5)
Eosinophils Relative: 1 %
HCT: 34.6 % — ABNORMAL LOW (ref 36.0–46.0)
Hemoglobin: 10.9 g/dL — ABNORMAL LOW (ref 12.0–15.0)
Immature Granulocytes: 0 %
Lymphocytes Relative: 51 %
Lymphs Abs: 1.2 10*3/uL (ref 0.7–4.0)
MCH: 27.7 pg (ref 26.0–34.0)
MCHC: 31.5 g/dL (ref 30.0–36.0)
MCV: 87.8 fL (ref 80.0–100.0)
Monocytes Absolute: 0.3 10*3/uL (ref 0.1–1.0)
Monocytes Relative: 12 %
Neutro Abs: 0.8 10*3/uL — ABNORMAL LOW (ref 1.7–7.7)
Neutrophils Relative %: 35 %
Platelets: 263 10*3/uL (ref 150–400)
RBC: 3.94 MIL/uL (ref 3.87–5.11)
RDW: 20.7 % — ABNORMAL HIGH (ref 11.5–15.5)
WBC: 2.4 10*3/uL — ABNORMAL LOW (ref 4.0–10.5)
nRBC: 0 % (ref 0.0–0.2)

## 2021-07-04 LAB — CEA (IN HOUSE-CHCC): CEA (CHCC-In House): 1.14 ng/mL (ref 0.00–5.00)

## 2021-07-04 LAB — FERRITIN: Ferritin: 122 ng/mL (ref 11–307)

## 2021-07-05 ENCOUNTER — Encounter: Payer: Self-pay | Admitting: Hematology

## 2021-07-05 ENCOUNTER — Telehealth: Payer: Self-pay | Admitting: Hematology

## 2021-07-05 NOTE — Telephone Encounter (Signed)
Scheduled follow-up appointments per 6/12 los. Patient is aware.

## 2021-07-11 ENCOUNTER — Inpatient Hospital Stay: Payer: 59

## 2021-07-11 ENCOUNTER — Other Ambulatory Visit: Payer: Self-pay | Admitting: Lab

## 2021-07-11 DIAGNOSIS — C187 Malignant neoplasm of sigmoid colon: Secondary | ICD-10-CM | POA: Diagnosis not present

## 2021-07-11 LAB — COMPREHENSIVE METABOLIC PANEL
ALT: 24 U/L (ref 0–44)
AST: 41 U/L (ref 15–41)
Albumin: 4.4 g/dL (ref 3.5–5.0)
Alkaline Phosphatase: 103 U/L (ref 38–126)
Anion gap: 8 (ref 5–15)
BUN: 15 mg/dL (ref 6–20)
CO2: 26 mmol/L (ref 22–32)
Calcium: 10.2 mg/dL (ref 8.9–10.3)
Chloride: 106 mmol/L (ref 98–111)
Creatinine, Ser: 0.86 mg/dL (ref 0.44–1.00)
GFR, Estimated: >60 mL/min (ref >60)
Glucose, Bld: 89 mg/dL (ref 70–99)
Potassium: 4.1 mmol/L (ref 3.5–5.1)
Sodium: 140 mmol/L (ref 135–145)
Total Bilirubin: 0.7 mg/dL (ref 0.3–1.2)
Total Protein: 8.4 g/dL — ABNORMAL HIGH (ref 6.5–8.1)

## 2021-07-11 LAB — CBC WITH DIFFERENTIAL/PLATELET
Abs Immature Granulocytes: 0 10*3/uL (ref 0.00–0.07)
Basophils Absolute: 0 10*3/uL (ref 0.0–0.1)
Basophils Relative: 0 %
Eosinophils Absolute: 0 10*3/uL (ref 0.0–0.5)
Eosinophils Relative: 1 %
HCT: 36.2 % (ref 36.0–46.0)
Hemoglobin: 11.3 g/dL — ABNORMAL LOW (ref 12.0–15.0)
Immature Granulocytes: 0 %
Lymphocytes Relative: 47 %
Lymphs Abs: 1.3 10*3/uL (ref 0.7–4.0)
MCH: 27.5 pg (ref 26.0–34.0)
MCHC: 31.2 g/dL (ref 30.0–36.0)
MCV: 88.1 fL (ref 80.0–100.0)
Monocytes Absolute: 0.3 10*3/uL (ref 0.1–1.0)
Monocytes Relative: 11 %
Neutro Abs: 1.1 10*3/uL — ABNORMAL LOW (ref 1.7–7.7)
Neutrophils Relative %: 41 %
Platelets: 174 10*3/uL (ref 150–400)
RBC: 4.11 MIL/uL (ref 3.87–5.11)
RDW: 19.1 % — ABNORMAL HIGH (ref 11.5–15.5)
WBC: 2.8 10*3/uL — ABNORMAL LOW (ref 4.0–10.5)
nRBC: 0 % (ref 0.0–0.2)

## 2021-07-22 ENCOUNTER — Encounter: Payer: Self-pay | Admitting: Hematology

## 2021-07-22 ENCOUNTER — Other Ambulatory Visit: Payer: Self-pay

## 2021-07-22 ENCOUNTER — Inpatient Hospital Stay (HOSPITAL_BASED_OUTPATIENT_CLINIC_OR_DEPARTMENT_OTHER): Payer: 59 | Admitting: Hematology

## 2021-07-22 ENCOUNTER — Inpatient Hospital Stay: Payer: 59

## 2021-07-22 VITALS — BP 118/87 | HR 74 | Temp 98.4°F | Resp 18 | Wt 140.7 lb

## 2021-07-22 DIAGNOSIS — C187 Malignant neoplasm of sigmoid colon: Secondary | ICD-10-CM

## 2021-07-22 DIAGNOSIS — Z1231 Encounter for screening mammogram for malignant neoplasm of breast: Secondary | ICD-10-CM

## 2021-07-22 LAB — CBC WITH DIFFERENTIAL/PLATELET
Abs Immature Granulocytes: 0.01 10*3/uL (ref 0.00–0.07)
Basophils Absolute: 0 10*3/uL (ref 0.0–0.1)
Basophils Relative: 1 %
Eosinophils Absolute: 0 10*3/uL (ref 0.0–0.5)
Eosinophils Relative: 1 %
HCT: 36.2 % (ref 36.0–46.0)
Hemoglobin: 11.3 g/dL — ABNORMAL LOW (ref 12.0–15.0)
Immature Granulocytes: 0 %
Lymphocytes Relative: 36 %
Lymphs Abs: 1.3 10*3/uL (ref 0.7–4.0)
MCH: 28.5 pg (ref 26.0–34.0)
MCHC: 31.2 g/dL (ref 30.0–36.0)
MCV: 91.2 fL (ref 80.0–100.0)
Monocytes Absolute: 0.4 10*3/uL (ref 0.1–1.0)
Monocytes Relative: 10 %
Neutro Abs: 1.9 10*3/uL (ref 1.7–7.7)
Neutrophils Relative %: 52 %
Platelets: 315 10*3/uL (ref 150–400)
RBC: 3.97 MIL/uL (ref 3.87–5.11)
RDW: 19.6 % — ABNORMAL HIGH (ref 11.5–15.5)
WBC: 3.7 10*3/uL — ABNORMAL LOW (ref 4.0–10.5)
nRBC: 0 % (ref 0.0–0.2)

## 2021-07-22 LAB — COMPREHENSIVE METABOLIC PANEL
ALT: 22 U/L (ref 0–44)
AST: 42 U/L — ABNORMAL HIGH (ref 15–41)
Albumin: 4.5 g/dL (ref 3.5–5.0)
Alkaline Phosphatase: 93 U/L (ref 38–126)
Anion gap: 6 (ref 5–15)
BUN: 15 mg/dL (ref 6–20)
CO2: 29 mmol/L (ref 22–32)
Calcium: 10.1 mg/dL (ref 8.9–10.3)
Chloride: 106 mmol/L (ref 98–111)
Creatinine, Ser: 0.92 mg/dL (ref 0.44–1.00)
GFR, Estimated: >60 mL/min (ref >60)
Glucose, Bld: 76 mg/dL (ref 70–99)
Potassium: 4 mmol/L (ref 3.5–5.1)
Sodium: 141 mmol/L (ref 135–145)
Total Bilirubin: 0.5 mg/dL (ref 0.3–1.2)
Total Protein: 8.5 g/dL — ABNORMAL HIGH (ref 6.5–8.1)

## 2021-07-22 MED ORDER — CAPECITABINE 500 MG PO TABS: ORAL_TABLET | ORAL | 1 refills | Status: DC

## 2021-07-22 NOTE — Progress Notes (Signed)
[place]   [phone] Fax:([phone]) [phone]   Clinic Follow up Note   Patient Care Team: Pcp, No as PCP - General Johnathan Hausen, MD as Consulting Physician (General Surgery) Otis Brace, MD as Consulting Physician (Gastroenterology) Truitt Merle, MD as Consulting Physician (Hematology)  Date of Service:  07/22/2021  CHIEF COMPLAINT: f/u of sigmoid colon cancer  CURRENT THERAPY:  Adjuvant Xeloda, q21d, starting 03/14/21  -dose: 1551m BID days 1-14  ASSESSMENT & PLAN:  Barbara Washburnis a 46y.o. female with   1. 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 Returned with worsening pain on 02/07/21; CT AP showed severe wall thickening and hyperenhancement involving distal sigmoid colon and rectum, 3 cm abscess at rectosigmoid junction, and one enlarged left external iliac lymph node. She was admitted, and colonoscopy on 02/09/21 by Dr. BAlessandra Bevelsshowed adenomatous lesion with extensive high-grade dysplasia and nonspecific colitis. -s/p emergent partial colectomy 02/10/21 under Dr. MHassell Done Pathology showed 9.5 cm invasive moderately differentiated adenocarcinoma to sigmoid colon. Margins and lymph nodes negative. -she received adjuvant CAPOX 03/14/21 - 05/23/21 to reduce her risk of recurrence. She tolerated very well with expected cold sensitivity.  -she now continues on Xeloda alone to complete 6 months of treatment, current dose 15034mBID. Plan to increase dose to 150060mM and 2000m43m with cycle 3. -surveillance colonoscopy on 06/21/21 with Dr. DorsLorenso Courier unremarkable. Plan for repeat in 1 year. -plan for post-treatment scan in 07/2021; I ordered today.    2.  Iron deficiency anemia -likely secondary to #1 -she received B12 and IV Ferrlecit on 11/29/20 and IV Feraheme on 02/15/21. -overall stable in 10-range   3. Genetics -she has a family history of breast cancer, colon cancer, and  lymphoma. -testing on 03/28/21 was negative   4. Neutropenia -Secondary to chemotherapy  5. Cancer Screening -she notes she is due for repeat mammogram; I ordered for her today.     PLAN:  -start C5 Xeloda Monday, 7/3 -screening mammogram to be done anytime in next few months, ordered today  -f/u in 3 weeks, with lab and CT several days before   No problem-specific Assessment & Plan notes found for this encounter.   SUMMARY OF ONCOLOGIC HISTORY: Oncology History Overview Note   Cancer Staging  [facility]: Colon and Rectum, AJCC 8th Edition - Pathologic stage from 02/10/2021: Stage IIB (pT4a, pN0, cM0) - Signed by FengTruitt Merle on 02/28/2021    Cancer of sigmoid colon ([facility])[place]/05/2020 Imaging   CLINICAL DATA:  Abdominal pain that is began Monday. Patient reports seen scant amounts of dark blood in stool. Feels bloated.   EXAM: CT ABDOMEN AND PELVIS WITH CONTRAST  IMPRESSION: 1. Proctocolitis involving the mid and distal sigmoid colon and upper rectum with a 3.9 cm gas containing interloop abscess centered in the sigmoid mesocolon. 2. Severe fecal burden.  No findings of bowel obstruction. 3. An enlarged left common iliac lymph node is likely reactive. 4. A 2.7 cm hemangioma is noted in the right hepatic lobe. 5. Mild focal bronchiolitis in the right lower lobe.   02/07/2021 Imaging   CLINICAL DATA:  Abdominal pain, cramping, constipation   EXAM: CT ABDOMEN AND PELVIS WITH CONTRAST  IMPRESSION: 1. There is severe, circumferential wall thickening and mucosal hyperenhancement involving the distal sigmoid colon and rectum to the anus, generally tethered appearance unchanged, involving at least 15-20 cm of  distal sigmoid and rectum. 2. There is again seen an abscess at the medial aspect of the rectosigmoid junction measuring approximately 3.0 x 1.6 cm. 3. Findings are generally consistent with nonspecific infectious, inflammatory, or ischemic  colitis, including inflammatory bowel disease such as Crohn's disease or ulcerative colitis; however an underlying malignancy is very difficult to exclude, particularly given substantial wall thickening. 4. Large burden of stool throughout the extremely redundant proximal colon, likely reflecting some degree of obstipation. 5. Enlarged left external iliac lymph nodes measuring up to 1.1 x 0.9 cm fall, nonspecific, possibly reactive, however nodal metastatic disease is not excluded per above.   02/09/2021 Initial Biopsy   FINAL MICROSCOPIC DIAGNOSIS:   A. COLON MASS, SIGMOID, BIOPSY:  - Adenomatous lesion with extensive high-grade dysplasia, see comment   B. RECTAL, COLON, SIGMOID, BIOPSY:  - Mild acute/ subacute nonspecific colitis, see comment  - Negative for definite features of chronicity, granulomas or dysplasia   COMMENT:  A.  Evidence of invasive carcinoma is not seen in the submitted biopsies but the biopsy fragments appear superficial and a more severe deeper process cannot be ruled out.  B.  Histologic features in the rectosigmoid biopsies are not compatible with untreated inflammatory bowel disease.  Differential diagnosis can include infection, drug-effect and stercoral proctitis among other possibilities.    02/09/2021 Procedure   Colonoscopy, Dr. Alessandra Bevels  Impression: - Preparation of the colon was fair. - Likely malignant partially obstructing tumor in the distal sigmoid colon. Biopsied. Tattooed. - Congested, inflamed and thickened folds of the mucosa in the recto-sigmoid colon. Biopsied. Tattooed.   02/10/2021 Cancer Staging   Staging form: Colon and Rectum, AJCC 8th Edition - Pathologic stage from 02/10/2021: Stage IIB (pT4a, pN0, cM0) - Signed by Truitt Merle, MD on 02/28/2021 Stage prefix: Initial diagnosis Total positive nodes: 0 Histologic grading system: 4 grade system Histologic grade (G): G2 Residual tumor (R): R0 - None   02/10/2021 Definitive Surgery   FINAL  MICROSCOPIC DIAGNOSIS:   A. COLON, SIGMOID:  - Invasive moderately differentiated adenocarcinoma.  - Twenty-seven lymph nodes, negative for metastatic carcinoma (0/27).  - See oncology table below.   B. COLON, DISTAL, SIGMOID:  - Segment of colon with serosal adhesions, acute inflammation, and tattoo pigment.  - Negative for malignancy.    02/11/2021 Initial Diagnosis   Cancer of sigmoid colon ([place])   03/14/2021 -  Chemotherapy   Patient is on Treatment Plan : COLORECTAL Xelox ([place]) q21d     04/20/2021 Genetic Testing   Negative hereditary cancer genetic testing: no pathogenic variants detected in [facility] [employer]Next-Cancer +RNAinsight Panel.  Report date is April 20, 2021.   The CustomNext-Cancer+RNAinsight panel offered by [facility] includes sequencing and rearrangement analysis for the following 47 genes:  APC, ATM, AXIN2, BARD1, BMPR1A, BRCA1, BRCA2, BRIP1, CDH1, CDK4, CDKN2A, CHEK2, DICER1, EPCAM, GREM1, HOXB13, MEN1, MLH1, MSH2, MSH3, MSH6, MUTYH, NBN, NF1, NF2, NTHL1, PALB2, PMS2, POLD1, POLE, PTEN, RAD51C, RAD51D, RECQL, RET, SDHA, SDHAF2, SDHB, SDHC, SDHD, SMAD4, SMARCA4, STK11, TP53, TSC1, TSC2, and VHL.  RNA data is routinely analyzed for use in variant interpretation for all genes.       INTERVAL HISTORY:  Barbara Valdez is here for a follow up of colon cancer. She was last seen by NP Lacie on 07/04/21. She presents to the clinic accompanied by her mother. She reports she is feeling better on just Xeloda. She reports continued tingling in her feet.   All other systems were reviewed with the patient and are negative.  MEDICAL HISTORY:  Past Medical History:  Diagnosis Date   Anemia    Anxiety    Colon cancer ([facility]) 01/2021   stage 2   Complication of anesthesia    took a long time to wake up after surgery   Family history of breast cancer 03/30/2021   Family history of colon cancer 03/30/2021    SURGICAL HISTORY: Past Surgical History:  Procedure Laterality  Date   ABDOMINAL HYSTERECTOMY Bilateral 09/26/2016   Procedure: HYSTERECTOMY ABDOMINAL W/ BILATERAL SALPINGECTOMY;  Surgeon: Emily Filbert, MD;  Location: [facility];  Service: Gynecology;  Laterality: Bilateral;   BIOPSY  02/09/2021   Procedure: BIOPSY;  Surgeon: Otis Brace, MD;  Location: [facility];  Service: Gastroenterology;;   BREAST BIOPSY Left    COLONOSCOPY N/A 02/09/2021   Procedure: COLONOSCOPY;  Surgeon: Otis Brace, MD;  Location: [facility];  Service: Gastroenterology;  Laterality: N/A;   LAPAROSCOPIC PARTIAL COLECTOMY N/A 02/10/2021   Procedure: ROBOTIC ASSISTED PARTIAL COLECTOMY WITH COLOSTOMY;  Surgeon: Johnathan Hausen, MD;  Location: [facility];  Service: General;  Laterality: N/A;   SUBMUCOSAL TATTOO INJECTION  02/09/2021   Procedure: SUBMUCOSAL TATTOO INJECTION;  Surgeon: Otis Brace, MD;  Location: [facility];  Service: Gastroenterology;;   TUBAL LIGATION      I have reviewed the social history and family history with the patient and they are unchanged from previous note.  ALLERGIES:  is allergic to ferrlecit [na ferric gluc cplx in sucrose].  MEDICATIONS:  Current Outpatient Medications  Medication Sig Dispense Refill   acetaminophen (TYLENOL) 500 MG tablet Take 2 tablets (1,000 mg total) by mouth every 6 (six) hours as needed for mild pain. (Patient not taking: Reported on 06/21/2021) 30 tablet 0   capecitabine (XELODA) 500 MG tablet Take 3 tabs in morning and 4 tabs in evening,every 12 hours, for 14 days, then off for 7 days. Take after meals. 98 tablet 1   methocarbamol (ROBAXIN) 750 MG tablet Take 1 tablet (750 mg total) by mouth every 8 (eight) hours as needed for muscle spasms. 30 tablet 0   Multiple Vitamin (MULTIVITAMIN WITH MINERALS) TABS tablet Take 1 tablet by mouth daily. 150 tablet 0   ondansetron (ZOFRAN) 8 MG tablet Take 1 tablet (8 mg total) by mouth 2 (two) times daily as needed for refractory nausea / vomiting. Start on day 3 after  chemotherapy. 30 tablet 1   pantoprazole (PROTONIX) 40 MG tablet Take 40 mg by mouth daily as needed. (Patient not taking: Reported on 06/21/2021)     polyethylene glycol (MIRALAX / GLYCOLAX) 17 g packet Take 17 g by mouth daily. 14 each 0   prochlorperazine (COMPAZINE) 10 MG tablet TAKE 1 TABLET BY MOUTH EVERY 6 HOURS AS NEEDED 30 tablet 1   No current facility-administered medications for this visit.    PHYSICAL EXAMINATION: ECOG PERFORMANCE STATUS: 1 - Symptomatic but completely ambulatory  Vitals:   07/22/21 1228  BP: 118/87  Pulse: 74  Resp: 18  Temp: 98.4 F (36.9 C)  SpO2: 100%   Wt Readings from Last 3 Encounters:  07/22/21 140 lb 11.2 oz (63.8 kg)  06/21/21 131 lb (59.4 kg)  06/08/21 131 lb (59.4 kg)     GENERAL:alert, no distress and comfortable SKIN: skin color normal, no rashes or significant lesions EYES: normal, Conjunctiva are pink and non-injected, sclera clear  NEURO: alert & oriented x 3 with fluent speech  LABORATORY DATA:  I have reviewed the data as listed    Latest Ref Rng &  Units 07/22/2021   12:11 PM 07/11/2021    1:01 PM 07/04/2021   11:03 AM  CBC  WBC 4.0 - 10.5 K/uL 3.7  2.8  2.4   Hemoglobin 12.0 - 15.0 g/dL 11.3  11.3  10.9   Hematocrit 36.0 - 46.0 % 36.2  36.2  34.6   Platelets 150 - 400 K/uL 315  174  263         Latest Ref Rng & Units 07/22/2021   12:11 PM 07/11/2021    1:01 PM 07/04/2021   11:03 AM  CMP  Glucose 70 - 99 mg/dL 76  89  94   BUN 6 - 20 mg/dL _0 Creatinine 0.44 - 1.00 mg/dL 0.92  0.86  0.84   Sodium 135 - 145 mmol/L 141  140  141   Potassium 3.5 - 5.1 mmol/L 4.0  4.1  3.9   Chloride 98 - 111 mmol/L 106  106  106   CO2 22 - 32 mmol/L _1 Calcium 8.9 - 10.3 mg/dL 10.1  10.2  10.0   Total Protein 6.5 - 8.1 g/dL 8.5  8.4  8.0   Total Bilirubin 0.3 - 1.2 mg/dL 0.5  0.7  0.5   Alkaline Phos 38 - 126 U/L 93  103  98   AST 15 - 41 U/L 42  41  41   ALT 0 - 44 U/L _2 RADIOGRAPHIC  STUDIES: I have personally reviewed the radiological images as listed and agreed with the findings in the report. No results found.    Orders Placed This Encounter  Procedures   MM Digital Screening    INS: [employer] PF: 11/07/16 @ [facility] NO BRST ISSUES NO HX BRST CA AT THE AGE OF 15 SHE HAD LT BRST CYST REMOVED NO IMPLANTS OR REDUCTION NO SPECIAL PHYSICAL NEEDS NO COVID SCHEDULED WITH: PATIENT INFORMED OF $82 NO SHOW POLICY RECOMMENDED 2 PIECE OUTFIT PREP: NO BATH POWDERS, LOTIONS, DEODORANTS OR PERFUMES IN THE BREAST OR UNDERARM AREAS/CH    Standing Status:   Future    Standing Expiration Date:   07/22/2022    Order Specific Question:   Reason for Exam (SYMPTOM  OR DIAGNOSIS REQUIRED)    Answer:   screening    Order Specific Question:   Is the patient pregnant?    Answer:   No    Order Specific Question:   Preferred imaging location?    Answer:   [facility]   CT ABDOMEN PELVIS W CONTRAST    Standing Status:   Future    Standing Expiration Date:   07/23/2022    Order Specific Question:   If indicated for the ordered procedure, I authorize the administration of contrast media per Radiology protocol    Answer:   Yes    Order Specific Question:   Preferred imaging location?    Answer:   [facility]    Order Specific Question:   Is Oral Contrast requested for this exam?    Answer:   Yes, Per Radiology protocol    Order Specific Question:   Is patient pregnant?    Answer:   No   All questions were answered. The patient knows to call the clinic with any problems, questions or concerns. No barriers to learning was detected. The total time spent in the appointment was 30 minutes.     Truitt Merle, MD  07/22/2021   I, Wilburn Mylar, am acting as scribe for Truitt Merle, MD.   I have reviewed the above documentation for accuracy and completeness, and I agree with the above.

## 2021-08-10 ENCOUNTER — Ambulatory Visit
Admission: RE | Admit: 2021-08-10 | Discharge: 2021-08-10 | Disposition: A | Discharge status: Home / Self Care | Payer: 59 | Source: Ambulatory Visit | Attending: Hematology | Admitting: Hematology

## 2021-08-10 DIAGNOSIS — Z1231 Encounter for screening mammogram for malignant neoplasm of breast: Secondary | ICD-10-CM

## 2021-08-11 ENCOUNTER — Ambulatory Visit (HOSPITAL_COMMUNITY)
Admission: RE | Admit: 2021-08-11 | Discharge: 2021-08-11 | Disposition: A | Discharge status: Home / Self Care | Payer: 59 | Source: Ambulatory Visit | Attending: Hematology | Admitting: Hematology

## 2021-08-11 DIAGNOSIS — C187 Malignant neoplasm of sigmoid colon: Secondary | ICD-10-CM | POA: Diagnosis present

## 2021-08-11 MED ORDER — IOHEXOL 300 MG/ML  SOLN
100.0000 mL | Freq: Once | INTRAMUSCULAR | Status: AC | PRN
  Administered 2021-08-11: 100 mL via INTRAVENOUS

## 2021-08-12 ENCOUNTER — Inpatient Hospital Stay: Payer: 59 | Attending: Hematology

## 2021-08-12 ENCOUNTER — Other Ambulatory Visit: Payer: Self-pay

## 2021-08-12 ENCOUNTER — Inpatient Hospital Stay (HOSPITAL_BASED_OUTPATIENT_CLINIC_OR_DEPARTMENT_OTHER): Payer: 59 | Admitting: Hematology

## 2021-08-12 ENCOUNTER — Encounter: Payer: Self-pay | Admitting: Hematology

## 2021-08-12 VITALS — BP 116/79 | HR 96 | Temp 99.1°F | Resp 18 | Ht 65.0 in | Wt 141.9 lb

## 2021-08-12 DIAGNOSIS — R0789 Other chest pain: Secondary | ICD-10-CM | POA: Diagnosis not present

## 2021-08-12 DIAGNOSIS — Z79899 Other long term (current) drug therapy: Secondary | ICD-10-CM | POA: Insufficient documentation

## 2021-08-12 DIAGNOSIS — T451X5A Adverse effect of antineoplastic and immunosuppressive drugs, initial encounter: Secondary | ICD-10-CM | POA: Insufficient documentation

## 2021-08-12 DIAGNOSIS — D509 Iron deficiency anemia, unspecified: Secondary | ICD-10-CM | POA: Diagnosis not present

## 2021-08-12 DIAGNOSIS — D701 Agranulocytosis secondary to cancer chemotherapy: Secondary | ICD-10-CM | POA: Diagnosis not present

## 2021-08-12 DIAGNOSIS — Z800 Family history of malignant neoplasm of digestive organs: Secondary | ICD-10-CM | POA: Insufficient documentation

## 2021-08-12 DIAGNOSIS — Z803 Family history of malignant neoplasm of breast: Secondary | ICD-10-CM | POA: Diagnosis not present

## 2021-08-12 DIAGNOSIS — D500 Iron deficiency anemia secondary to blood loss (chronic): Secondary | ICD-10-CM

## 2021-08-12 DIAGNOSIS — R202 Paresthesia of skin: Secondary | ICD-10-CM | POA: Diagnosis not present

## 2021-08-12 DIAGNOSIS — R5383 Other fatigue: Secondary | ICD-10-CM | POA: Diagnosis not present

## 2021-08-12 DIAGNOSIS — C187 Malignant neoplasm of sigmoid colon: Secondary | ICD-10-CM

## 2021-08-12 LAB — CBC WITH DIFFERENTIAL/PLATELET
Abs Immature Granulocytes: 0.01 10*3/uL (ref 0.00–0.07)
Basophils Absolute: 0 10*3/uL (ref 0.0–0.1)
Basophils Relative: 1 %
Eosinophils Absolute: 0.1 10*3/uL (ref 0.0–0.5)
Eosinophils Relative: 2 %
HCT: 35 % — ABNORMAL LOW (ref 36.0–46.0)
Hemoglobin: 11.3 g/dL — ABNORMAL LOW (ref 12.0–15.0)
Immature Granulocytes: 0 %
Lymphocytes Relative: 40 %
Lymphs Abs: 1.3 10*3/uL (ref 0.7–4.0)
MCH: 28.8 pg (ref 26.0–34.0)
MCHC: 32.3 g/dL (ref 30.0–36.0)
MCV: 89.3 fL (ref 80.0–100.0)
Monocytes Absolute: 0.3 10*3/uL (ref 0.1–1.0)
Monocytes Relative: 10 %
Neutro Abs: 1.6 10*3/uL — ABNORMAL LOW (ref 1.7–7.7)
Neutrophils Relative %: 47 %
Platelets: 232 10*3/uL (ref 150–400)
RBC: 3.92 MIL/uL (ref 3.87–5.11)
RDW: 16.2 % — ABNORMAL HIGH (ref 11.5–15.5)
WBC: 3.3 10*3/uL — ABNORMAL LOW (ref 4.0–10.5)
nRBC: 0 % (ref 0.0–0.2)

## 2021-08-12 LAB — COMPREHENSIVE METABOLIC PANEL
ALT: 12 U/L (ref 0–44)
AST: 24 U/L (ref 15–41)
Albumin: 4.4 g/dL (ref 3.5–5.0)
Alkaline Phosphatase: 69 U/L (ref 38–126)
Anion gap: 8 (ref 5–15)
BUN: 16 mg/dL (ref 6–20)
CO2: 28 mmol/L (ref 22–32)
Calcium: 9.9 mg/dL (ref 8.9–10.3)
Chloride: 107 mmol/L (ref 98–111)
Creatinine, Ser: 0.85 mg/dL (ref 0.44–1.00)
GFR, Estimated: >60 mL/min (ref >60)
Glucose, Bld: 105 mg/dL — ABNORMAL HIGH (ref 70–99)
Potassium: 3.9 mmol/L (ref 3.5–5.1)
Sodium: 143 mmol/L (ref 135–145)
Total Bilirubin: 0.6 mg/dL (ref 0.3–1.2)
Total Protein: 8 g/dL (ref 6.5–8.1)

## 2021-08-12 LAB — CEA (IN HOUSE-CHCC): CEA (CHCC-In House): 1.3 ng/mL (ref 0.00–5.00)

## 2021-08-12 LAB — FERRITIN: Ferritin: 73 ng/mL (ref 11–307)

## 2021-08-12 NOTE — Progress Notes (Signed)
[place]   [phone] [phone]   Clinic Follow up Note   Patient Care Team: Pcp, No as PCP - General Johnathan Hausen, MD as Consulting Physician (General Surgery) Otis Brace, MD as Consulting Physician (Gastroenterology) Truitt Merle, MD as Consulting Physician (Hematology)  Date of Service:  08/12/2021  CHIEF COMPLAINT: f/u of sigmoid colon cancer  CURRENT THERAPY:  Adjuvant Xeloda, q21d, starting 03/14/21             -dose: 1549m AM, 20049mPM days 1-14  ASSESSMENT & PLAN:  Barbara Raabes a [age].o. female with   1. 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 Returned with worsening pain on 02/07/21; CT AP showed severe wall thickening and hyperenhancement involving distal sigmoid colon and rectum, 3 cm abscess at rectosigmoid junction, and one enlarged left external iliac lymph node. She was admitted, and colonoscopy on 02/09/21 by Dr. BrAlessandra Bevelshowed adenomatous lesion with extensive high-grade dysplasia and nonspecific colitis. -s/p emergent partial colectomy 02/10/21 under Dr. MaHassell DonePathology showed 9.5 cm invasive moderately differentiated adenocarcinoma to sigmoid colon. Margins and lymph nodes negative. -she received adjuvant CAPOX 03/14/21 - 05/23/21 to reduce her risk of recurrence. She tolerated very well with expected cold sensitivity.  -she now continues on Xeloda alone to complete 6 months of treatment, current dose 150016mM and 2000m105m. -surveillance colonoscopy on 06/21/21 with Dr. DorsLorenso Courier unremarkable. Plan for repeat in 1 year. -she had restaging CT AP yesterday, 08/11/21, but the scan has not yet been read. I will call her when the results become available. -she continues to do well on Xeloda. She has some residual tingling in her feet from prior oxali, currently mild and tolerable. Labs reviewed, overall stable. Will continue Xeloda for two more  cycles.     2.  Iron deficiency anemia -likely secondary to #1 -she received B12 and IV Ferrlecit on 11/29/20 and IV Feraheme on 02/15/21. -overall stable in 11-range now   3. Genetics -she has a family history of breast cancer, colon cancer, and lymphoma. -testing on 03/28/21 was negative   4. Neutropenia -Secondary to chemotherapy   5. Cancer Screening -annual mammogram 08/10/21 was negative. -she is looking to establish care with GYN     PLAN:  -continue C6 Xeloda, will start cycle 7 on 7/31 -lab and f/u in 4-5 weeks before last cycle 8   No problem-specific Assessment & Plan notes found for this encounter.   SUMMARY OF ONCOLOGIC HISTORY: Oncology History Overview Note   Cancer Staging  [facility] ([facility] form: Colon and Rectum, AJCC 8th Edition - Pathologic stage from 02/10/2021: Stage IIB (pT4a, pN0, cM0) - Signed by FengTruitt Merle on 02/28/2021    Cancer of sigmoid colon (HCC)[place]1/05/2020 Imaging   CLINICAL DATA:  Abdominal pain that is began Monday. Patient reports seen scant amounts of dark blood in stool. Feels bloated.   EXAM: CT ABDOMEN AND PELVIS WITH CONTRAST  IMPRESSION: 1. Proctocolitis involving the mid and distal sigmoid colon and upper rectum with a 3.9 cm gas containing interloop abscess centered in the sigmoid mesocolon. 2. Severe fecal burden.  No findings of bowel obstruction. 3. An enlarged left common iliac lymph node is likely reactive. 4. A 2.7 cm hemangioma is noted in the right hepatic lobe. 5. Mild focal bronchiolitis in the right lower lobe.   02/07/2021 Imaging   CLINICAL DATA:  Abdominal  pain, cramping, constipation   EXAM: CT ABDOMEN AND PELVIS WITH CONTRAST  IMPRESSION: 1. There is severe, circumferential wall thickening and mucosal hyperenhancement involving the distal sigmoid colon and rectum to the anus, generally tethered appearance unchanged, involving at least 15-20 cm of distal sigmoid and rectum. 2. There is  again seen an abscess at the medial aspect of the rectosigmoid junction measuring approximately 3.0 x 1.6 cm. 3. Findings are generally consistent with nonspecific infectious, inflammatory, or ischemic colitis, including inflammatory bowel disease such as Crohn's disease or ulcerative colitis; however an underlying malignancy is very difficult to exclude, particularly given substantial wall thickening. 4. Large burden of stool throughout the extremely redundant proximal colon, likely reflecting some degree of obstipation. 5. Enlarged left external iliac lymph nodes measuring up to 1.1 x 0.9 cm fall, nonspecific, possibly reactive, however nodal metastatic disease is not excluded per above.   02/09/2021 Initial Biopsy   FINAL MICROSCOPIC DIAGNOSIS:   A. COLON MASS, SIGMOID, BIOPSY:  - Adenomatous lesion with extensive high-grade dysplasia, see comment   B. RECTAL, COLON, SIGMOID, BIOPSY:  - Mild acute/ subacute nonspecific colitis, see comment  - Negative for definite features of chronicity, granulomas or dysplasia   COMMENT:  A.  Evidence of invasive carcinoma is not seen in the submitted biopsies but the biopsy fragments appear superficial and a more severe deeper process cannot be ruled out.  B.  Histologic features in the rectosigmoid biopsies are not compatible with untreated inflammatory bowel disease.  Differential diagnosis can include infection, drug-effect and stercoral proctitis among other possibilities.    02/09/2021 Procedure   Colonoscopy, Dr. Alessandra Bevels  Impression: - Preparation of the colon was fair. - Likely malignant partially obstructing tumor in the distal sigmoid colon. Biopsied. Tattooed. - Congested, inflamed and thickened folds of the mucosa in the recto-sigmoid colon. Biopsied. Tattooed.   02/10/2021 Cancer Staging   Staging form: Colon and Rectum, AJCC 8th Edition - Pathologic stage from 02/10/2021: Stage IIB (pT4a, pN0, cM0) - Signed by Truitt Merle, MD on  02/28/2021 Stage prefix: Initial diagnosis Total positive nodes: 0 Histologic grading system: 4 grade system Histologic grade (G): G2 Residual tumor (R): R0 - None   02/10/2021 Definitive Surgery   FINAL MICROSCOPIC DIAGNOSIS:   A. COLON, SIGMOID:  - Invasive moderately differentiated adenocarcinoma.  - Twenty-seven lymph nodes, negative for metastatic carcinoma (0/27).  - See oncology table below.   B. COLON, DISTAL, SIGMOID:  - Segment of colon with serosal adhesions, acute inflammation, and tattoo pigment.  - Negative for malignancy.    02/11/2021 Initial Diagnosis   Cancer of sigmoid colon ([place])   03/14/2021 -  Chemotherapy   Patient is on Treatment Plan : COLORECTAL Xelox ([place]ox) q21d     04/20/2021 Genetic Testing   Negative hereditary cancer genetic testing: no pathogenic variants detected in [facility] CustomNext-Cancer +RNAinsight Panel.  Report date is April 20, 2021.   The CustomNext-Cancer+RNAinsight panel offered by [facility] includes sequencing and rearrangement analysis for the following 47 genes:  APC, ATM, AXIN2, BARD1, BMPR1A, BRCA1, BRCA2, BRIP1, CDH1, CDK4, CDKN2A, CHEK2, DICER1, EPCAM, GREM1, HOXB13, MEN1, MLH1, MSH2, MSH3, MSH6, MUTYH, NBN, NF1, NF2, NTHL1, PALB2, PMS2, POLD1, POLE, PTEN, RAD51C, RAD51D, RECQL, RET, SDHA, SDHAF2, SDHB, SDHC, SDHD, SMAD4, SMARCA4, STK11, TP53, TSC1, TSC2, and VHL.  RNA data is routinely analyzed for use in variant interpretation for all genes.       INTERVAL HISTORY:  Barbara Valdez is here for a follow up of colon cancer. She was last  seen by me on 07/22/21. She presents to the clinic accompanied by her mother. She reports she has some tingling in her feet, fatigue, and chest tightness. She tells me she didn't start this last cycle on time due to payment/insurance issues. So, she is currently on this week.   All other systems were reviewed with the patient and are negative.  MEDICAL HISTORY:  Past Medical History:  Diagnosis  Date   Anemia    Anxiety    Colon cancer ([place]) 01/2021   stage 2   Complication of anesthesia    took a long time to wake up after surgery   Family history of breast cancer 03/30/2021   Family history of colon cancer 03/30/2021    SURGICAL HISTORY: Past Surgical History:  Procedure Laterality Date   ABDOMINAL HYSTERECTOMY Bilateral 09/26/2016   Procedure: HYSTERECTOMY ABDOMINAL W/ BILATERAL SALPINGECTOMY;  Surgeon: Emily Filbert, MD;  Location: [facility];  Service: Gynecology;  Laterality: Bilateral;   BIOPSY  02/09/2021   Procedure: BIOPSY;  Surgeon: Otis Brace, MD;  Location: [facility];  Service: Gastroenterology;;   BREAST BIOPSY Left    COLONOSCOPY N/A 02/09/2021   Procedure: COLONOSCOPY;  Surgeon: Otis Brace, MD;  Location: [facility];  Service: Gastroenterology;  Laterality: N/A;   LAPAROSCOPIC PARTIAL COLECTOMY N/A 02/10/2021   Procedure: ROBOTIC ASSISTED PARTIAL COLECTOMY WITH COLOSTOMY;  Surgeon: Johnathan Hausen, MD;  Location: [facility];  Service: General;  Laterality: N/A;   SUBMUCOSAL TATTOO INJECTION  02/09/2021   Procedure: SUBMUCOSAL TATTOO INJECTION;  Surgeon: Otis Brace, MD;  Location: [facility];  Service: Gastroenterology;;   TUBAL LIGATION      I have reviewed the social history and family history with the patient and they are unchanged from previous note.  ALLERGIES:  is allergic to ferrlecit [na ferric gluc cplx in sucrose].  MEDICATIONS:  Current Outpatient Medications  Medication Sig Dispense Refill   acetaminophen (TYLENOL) 500 MG tablet Take 2 tablets (1,000 mg total) by mouth every 6 (six) hours as needed for mild pain. (Patient not taking: Reported on 06/21/2021) 30 tablet 0   capecitabine (XELODA) 500 MG tablet Take 3 tabs in morning and 4 tabs in evening,every 12 hours, for 14 days, then off for 7 days. Take after meals. 98 tablet 1   methocarbamol (ROBAXIN) 750 MG tablet Take 1 tablet (750 mg total) by mouth every 8 (eight) hours as  needed for muscle spasms. 30 tablet 0   Multiple Vitamin (MULTIVITAMIN WITH MINERALS) TABS tablet Take 1 tablet by mouth daily. 150 tablet 0   ondansetron (ZOFRAN) 8 MG tablet Take 1 tablet (8 mg total) by mouth 2 (two) times daily as needed for refractory nausea / vomiting. Start on day 3 after chemotherapy. 30 tablet 1   pantoprazole (PROTONIX) 40 MG tablet Take 40 mg by mouth daily as needed. (Patient not taking: Reported on 06/21/2021)     polyethylene glycol (MIRALAX / GLYCOLAX) 17 g packet Take 17 g by mouth daily. 14 each 0   prochlorperazine (COMPAZINE) 10 MG tablet TAKE 1 TABLET BY MOUTH EVERY 6 HOURS AS NEEDED 30 tablet 1   No current facility-administered medications for this visit.    PHYSICAL EXAMINATION: ECOG PERFORMANCE STATUS: 1 - Symptomatic but completely ambulatory  Vitals:   08/12/21 1357  BP: 116/79  Pulse: 96  Resp: 18  Temp: 99.1 F (37.3 C)  SpO2: 100%   Wt Readings from Last 3 Encounters:  08/12/21 141 lb 14.4 oz (64.4 kg)  07/22/21 140  lb 11.2 oz (63.8 kg)  06/21/21 131 lb (59.4 kg)     GENERAL:alert, no distress and comfortable SKIN: skin color normal, no rashes or significant lesions EYES: normal, Conjunctiva are pink and non-injected, sclera clear  NEURO: alert & oriented x 3 with fluent speech  LABORATORY DATA:  I have reviewed the data as listed    Latest Ref Rng & Units 08/12/2021    1:36 PM 07/22/2021   12:11 PM 07/11/2021    1:01 PM  CBC  WBC 4.0 - 10.5 K/uL 3.3  3.7  2.8   Hemoglobin 12.0 - 15.0 g/dL 11.3  11.3  11.3   Hematocrit 36.0 - 46.0 % 35.0  36.2  36.2   Platelets 150 - 400 K/uL 232  315  174         Latest Ref Rng & Units 08/12/2021    1:36 PM 07/22/2021   12:11 PM 07/11/2021    1:01 PM  CMP  Glucose 70 - 99 mg/dL 105  76  89   BUN 6 - 20 mg/dL _0 Creatinine 0.44 - 1.00 mg/dL 0.85  0.92  0.86   Sodium 135 - 145 mmol/L 143  141  140   Potassium 3.5 - 5.1 mmol/L 3.9  4.0  4.1   Chloride 98 - 111 mmol/L 107  106   106   CO2 22 - 32 mmol/L _1 Calcium 8.9 - 10.3 mg/dL 9.9  10.1  10.2   Total Protein 6.5 - 8.1 g/dL 8.0  8.5  8.4   Total Bilirubin 0.3 - 1.2 mg/dL 0.6  0.5  0.7   Alkaline Phos 38 - 126 U/L 69  93  103   AST 15 - 41 U/L 24  42  41   ALT 0 - 44 U/L _2 RADIOGRAPHIC STUDIES: I have personally reviewed the radiological images as listed and agreed with the findings in the report. CT ABDOMEN PELVIS W CONTRAST  Result Date: 08/12/2021 CLINICAL DATA:  History of colon cancer. Surveillance. No current complaints. * Tracking Code: BO * EXAM: CT ABDOMEN AND PELVIS WITH CONTRAST TECHNIQUE: Multidetector CT imaging of the abdomen and pelvis was performed using the standard protocol following bolus administration of intravenous contrast. RADIATION DOSE REDUCTION: This exam was performed according to the departmental dose-optimization program which includes automated exposure control, adjustment of the mA and/or kV according to patient size and/or use of iterative reconstruction technique. CONTRAST:  119m OMNIPAQUE IOHEXOL 300 MG/ML  SOLN COMPARISON:  Abdominopelvic CT 02/07/2021 FINDINGS: Lower chest: Clear lung bases. No significant pleural or pericardial effusion. Hepatobiliary: Stable 2.4 cm hemangioma within the dome of the right hepatic lobe (image 7/2). Stable small cyst posteriorly in the right hepatic lobe adjacent to the right kidney on image 16/2. No suspicious liver lesions. No evidence of gallstones, gallbladder wall thickening or biliary dilatation. Pancreas: Unremarkable. No pancreatic ductal dilatation or surrounding inflammatory changes. Spleen: Normal in size without focal abnormality. Adrenals/Urinary Tract: Both adrenal glands appear normal. The kidneys appear normal without evidence of urinary tract calculus, suspicious lesion or hydronephrosis. The bladder appears unremarkable for its degree of distention. Stomach/Bowel: Enteric contrast was administered and has  passed into the colostomy. Patient has undergone interval sigmoid colostomy and resection of the more distal sigmoid colon mass. [place] appears unremarkable. The stomach appears unremarkable for its degree of distension. No evidence of bowel wall  thickening, distention or surrounding inflammatory change. The appendix appears normal. Mildly prominent stool throughout the colon. Vascular/Lymphatic: No enlarged abdominal lymph nodes. A soft tissue nodule posterior to the left iliac vessels measures 2.2 x 1.3 cm on image 60/2 and is favored to be related to the left ovary based on continuity with the gonadal vessels. There is distortion of the pelvic anatomy by the patient's interval surgery. No definite pelvic adenopathy. No significant vascular findings. Reproductive: Previous hysterectomy (09/26/2016). As above, suspected ovarian tissue along the left external iliac vessels without suspicious adnexal findings. Other: As above, sigmoid colostomy in the left lower quadrant with postsurgical changes in anterior pelvic wall. No ascites or peritoneal nodularity. Musculoskeletal: No acute or significant osseous findings. IMPRESSION: 1. Interval partial sigmoid colon resection and colostomy. 2. Soft tissue nodule along the posterior aspect of the left external iliac vessels is favored to be related to the patient's left ovary. Pelvic adenopathy difficult to completely exclude. Correlate with CEA levels (today's labs pending). No other adenopathy or signs of metastatic disease. 3. Stable hepatic hemangioma. Electronically Signed   By: Richardean Sale M.D.   On: 08/12/2021 15:43      No orders of the defined types were placed in this encounter.  All questions were answered. The patient knows to call the clinic with any problems, questions or concerns. No barriers to learning was detected. The total time spent in the appointment was 30 minutes.     Truitt Merle, MD 08/12/2021   I, Wilburn Mylar, am acting as  scribe for Truitt Merle, MD.   I have reviewed the above documentation for accuracy and completeness, and I agree with the above.

## 2021-08-15 ENCOUNTER — Other Ambulatory Visit: Payer: Self-pay

## 2021-08-19 ENCOUNTER — Other Ambulatory Visit: Payer: Self-pay

## 2021-09-07 ENCOUNTER — Other Ambulatory Visit: Payer: Self-pay

## 2021-09-13 ENCOUNTER — Other Ambulatory Visit: Payer: Self-pay | Admitting: Hematology

## 2021-09-13 NOTE — Telephone Encounter (Signed)
Refilled Capecitabine 572m for [facility] no refill.  Pt scheduled to see Dr. FBurr Medico08/24/2023.

## 2021-09-15 ENCOUNTER — Inpatient Hospital Stay (HOSPITAL_BASED_OUTPATIENT_CLINIC_OR_DEPARTMENT_OTHER): Payer: PRIVATE HEALTH INSURANCE | Admitting: Hematology

## 2021-09-15 ENCOUNTER — Encounter: Payer: Self-pay | Admitting: Hematology

## 2021-09-15 ENCOUNTER — Other Ambulatory Visit: Payer: Self-pay

## 2021-09-15 ENCOUNTER — Inpatient Hospital Stay: Payer: PRIVATE HEALTH INSURANCE | Attending: Hematology

## 2021-09-15 VITALS — BP 120/86 | HR 66 | Temp 98.8°F | Resp 16 | Ht 65.0 in | Wt 145.0 lb

## 2021-09-15 DIAGNOSIS — D509 Iron deficiency anemia, unspecified: Secondary | ICD-10-CM | POA: Diagnosis not present

## 2021-09-15 DIAGNOSIS — C187 Malignant neoplasm of sigmoid colon: Secondary | ICD-10-CM | POA: Diagnosis present

## 2021-09-15 DIAGNOSIS — Z807 Family history of other malignant neoplasms of lymphoid, hematopoietic and related tissues: Secondary | ICD-10-CM | POA: Insufficient documentation

## 2021-09-15 DIAGNOSIS — Z800 Family history of malignant neoplasm of digestive organs: Secondary | ICD-10-CM | POA: Diagnosis not present

## 2021-09-15 DIAGNOSIS — D701 Agranulocytosis secondary to cancer chemotherapy: Secondary | ICD-10-CM | POA: Insufficient documentation

## 2021-09-15 DIAGNOSIS — Z79899 Other long term (current) drug therapy: Secondary | ICD-10-CM | POA: Insufficient documentation

## 2021-09-15 DIAGNOSIS — T451X5A Adverse effect of antineoplastic and immunosuppressive drugs, initial encounter: Secondary | ICD-10-CM | POA: Insufficient documentation

## 2021-09-15 DIAGNOSIS — Z803 Family history of malignant neoplasm of breast: Secondary | ICD-10-CM | POA: Insufficient documentation

## 2021-09-15 DIAGNOSIS — Z9049 Acquired absence of other specified parts of digestive tract: Secondary | ICD-10-CM | POA: Diagnosis not present

## 2021-09-15 LAB — COMPREHENSIVE METABOLIC PANEL
ALT: 10 U/L (ref 0–44)
AST: 21 U/L (ref 15–41)
Albumin: 4.3 g/dL (ref 3.5–5.0)
Alkaline Phosphatase: 62 U/L (ref 38–126)
Anion gap: 3 — ABNORMAL LOW (ref 5–15)
BUN: 13 mg/dL (ref 6–20)
CO2: 32 mmol/L (ref 22–32)
Calcium: 9.9 mg/dL (ref 8.9–10.3)
Chloride: 107 mmol/L (ref 98–111)
Creatinine, Ser: 0.72 mg/dL (ref 0.44–1.00)
GFR, Estimated: >60 mL/min (ref >60)
Glucose, Bld: 86 mg/dL (ref 70–99)
Potassium: 4 mmol/L (ref 3.5–5.1)
Sodium: 142 mmol/L (ref 135–145)
Total Bilirubin: 0.4 mg/dL (ref 0.3–1.2)
Total Protein: 7.9 g/dL (ref 6.5–8.1)

## 2021-09-15 LAB — CBC WITH DIFFERENTIAL/PLATELET
Abs Immature Granulocytes: 0 10*3/uL (ref 0.00–0.07)
Basophils Absolute: 0 10*3/uL (ref 0.0–0.1)
Basophils Relative: 0 %
Eosinophils Absolute: 0 10*3/uL (ref 0.0–0.5)
Eosinophils Relative: 1 %
HCT: 36.2 % (ref 36.0–46.0)
Hemoglobin: 11.7 g/dL — ABNORMAL LOW (ref 12.0–15.0)
Immature Granulocytes: 0 %
Lymphocytes Relative: 41 %
Lymphs Abs: 1.2 10*3/uL (ref 0.7–4.0)
MCH: 28.7 pg (ref 26.0–34.0)
MCHC: 32.3 g/dL (ref 30.0–36.0)
MCV: 88.9 fL (ref 80.0–100.0)
Monocytes Absolute: 0.3 10*3/uL (ref 0.1–1.0)
Monocytes Relative: 11 %
Neutro Abs: 1.4 10*3/uL — ABNORMAL LOW (ref 1.7–7.7)
Neutrophils Relative %: 47 %
Platelets: 242 10*3/uL (ref 150–400)
RBC: 4.07 MIL/uL (ref 3.87–5.11)
RDW: 16.5 % — ABNORMAL HIGH (ref 11.5–15.5)
WBC: 3 10*3/uL — ABNORMAL LOW (ref 4.0–10.5)
nRBC: 0 % (ref 0.0–0.2)

## 2021-09-15 NOTE — Progress Notes (Signed)
[place]   [phone] [phone]   Clinic Follow up Note   Patient Care Team: Pcp, No as PCP - General Johnathan Hausen, MD as Consulting Physician (General Surgery) Otis Brace, MD as Consulting Physician (Gastroenterology) Truitt Merle, MD as Consulting Physician (Hematology)  Date of Service:  09/15/2021  CHIEF COMPLAINT: f/u of sigmoid colon cancer  CURRENT THERAPY:  Adjuvant Xeloda, q21d, starting 03/14/21             -dose: 1573m AM, 2009mPM days 1-14  ASSESSMENT & PLAN:  TaLety Valdez a [age].o. female with   1. 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 Returned with worsening pain on 02/07/21; CT AP showed severe wall thickening and hyperenhancement involving distal sigmoid colon and rectum, 3 cm abscess at rectosigmoid junction, and one enlarged left external iliac lymph node. She was admitted, and colonoscopy on 02/09/21 by Dr. BrAlessandra Bevelshowed adenomatous lesion with extensive high-grade dysplasia and nonspecific colitis. -s/p emergent partial colectomy 02/10/21 under Dr. MaHassell DonePathology showed 9.5 cm invasive moderately differentiated adenocarcinoma to sigmoid colon. Margins and lymph nodes negative. -she received adjuvant CAPOX 03/14/21 - 05/23/21 to reduce her risk of recurrence. She tolerated very well with expected cold sensitivity.  -she now continues on Xeloda alone to complete 6 months of treatment, current dose 150026mM and 2000m83m. -surveillance colonoscopy on 06/21/21 with Dr. DorsLorenso Courier unremarkable. Plan for repeat in 1 year. -restaging CT AP on 08/11/21 showed: a soft tissue nodule favored to be related to left ovary, but adenopathy not completely excluded. -she continues to do well on Xeloda. She has some residual tingling in her feet from prior oxali, currently mild and tolerable. Labs reviewed, overall stable. This is her last cycle. -plan to  repeat CT scan in 5 months    2.  Iron deficiency anemia -likely secondary to #1 -she received B12 and IV Ferrlecit on 11/29/20 and IV Feraheme on 02/15/21. -overall stable in 11-range now   3. Genetics -she has a family history of breast cancer, colon cancer, and lymphoma. -testing on 03/28/21 was negative   4. Neutropenia -Secondary to chemotherapy   5. Cancer Screening -annual mammogram 08/10/21 was negative. -she is scheduled to meet GYN on 11/18/21.     PLAN:  -continue last cycle C8 Xeloda, will finish 9/3 -lab and f/u in 3 months, will order CT scan on next visit    No problem-specific Assessment & Plan notes found for this encounter.   SUMMARY OF ONCOLOGIC HISTORY: Oncology History Overview Note   Cancer Staging  [facility] form: Colon and Rectum, AJCC 8th Edition - Pathologic stage from 02/10/2021: Stage IIB (pT4a, pN0, cM0) - Signed by FengTruitt Merle on 02/28/2021    Cancer of sigmoid colon (HCC)[place]1/05/2020 Imaging   CLINICAL DATA:  Abdominal pain that is began Monday. Patient reports seen scant amounts of dark blood in stool. Feels bloated.   EXAM: CT ABDOMEN AND PELVIS WITH CONTRAST  IMPRESSION: 1. Proctocolitis involving the mid and distal sigmoid colon and upper rectum with a 3.9 cm gas containing interloop abscess centered in the sigmoid mesocolon. 2. Severe fecal burden.  No findings of bowel obstruction. 3. An enlarged left common iliac lymph node is likely reactive. 4. A 2.7 cm hemangioma is noted in the right hepatic lobe. 5. Mild focal bronchiolitis in the right lower lobe.   02/07/2021 Imaging  CLINICAL DATA:  Abdominal pain, cramping, constipation   EXAM: CT ABDOMEN AND PELVIS WITH CONTRAST  IMPRESSION: 1. There is severe, circumferential wall thickening and mucosal hyperenhancement involving the distal sigmoid colon and rectum to the anus, generally tethered appearance unchanged, involving at least 15-20 cm of distal  sigmoid and rectum. 2. There is again seen an abscess at the medial aspect of the rectosigmoid junction measuring approximately 3.0 x 1.6 cm. 3. Findings are generally consistent with nonspecific infectious, inflammatory, or ischemic colitis, including inflammatory bowel disease such as Crohn's disease or ulcerative colitis; however an underlying malignancy is very difficult to exclude, particularly given substantial wall thickening. 4. Large burden of stool throughout the extremely redundant proximal colon, likely reflecting some degree of obstipation. 5. Enlarged left external iliac lymph nodes measuring up to 1.1 x 0.9 cm fall, nonspecific, possibly reactive, however nodal metastatic disease is not excluded per above.   02/09/2021 Initial Biopsy   FINAL MICROSCOPIC DIAGNOSIS:   A. COLON MASS, SIGMOID, BIOPSY:  - Adenomatous lesion with extensive high-grade dysplasia, see comment   B. RECTAL, COLON, SIGMOID, BIOPSY:  - Mild acute/ subacute nonspecific colitis, see comment  - Negative for definite features of chronicity, granulomas or dysplasia   COMMENT:  A.  Evidence of invasive carcinoma is not seen in the submitted biopsies but the biopsy fragments appear superficial and a more severe deeper process cannot be ruled out.  B.  Histologic features in the rectosigmoid biopsies are not compatible with untreated inflammatory bowel disease.  Differential diagnosis can include infection, drug-effect and stercoral proctitis among other possibilities.    02/09/2021 Procedure   Colonoscopy, Dr. Alessandra Bevels  Impression: - Preparation of the colon was fair. - Likely malignant partially obstructing tumor in the distal sigmoid colon. Biopsied. Tattooed. - Congested, inflamed and thickened folds of the mucosa in the recto-sigmoid colon. Biopsied. Tattooed.   02/10/2021 Cancer Staging   Staging form: Colon and Rectum, AJCC 8th Edition - Pathologic stage from 02/10/2021: Stage IIB (pT4a, pN0, cM0)  - Signed by Truitt Merle, MD on 02/28/2021 Stage prefix: Initial diagnosis Total positive nodes: 0 Histologic grading system: 4 grade system Histologic grade (G): G2 Residual tumor (R): R0 - None   02/10/2021 Definitive Surgery   FINAL MICROSCOPIC DIAGNOSIS:   A. COLON, SIGMOID:  - Invasive moderately differentiated adenocarcinoma.  - Twenty-seven lymph nodes, negative for metastatic carcinoma (0/27).  - See oncology table below.   B. COLON, DISTAL, SIGMOID:  - Segment of colon with serosal adhesions, acute inflammation, and tattoo pigment.  - Negative for malignancy.    02/11/2021 Initial Diagnosis   Cancer of sigmoid colon ([place])   03/14/2021 -  Chemotherapy   Patient is on Treatment Plan : COLORECTAL Xelox ([place]) q21d     04/20/2021 Genetic Testing   Negative hereditary cancer genetic testing: no pathogenic variants detected in [facility] CustomNext-Cancer +RNAinsight Panel.  Report date is April 20, 2021.   The CustomNext-Cancer+RNAinsight panel offered by [facility] includes sequencing and rearrangement analysis for the following 47 genes:  APC, ATM, AXIN2, BARD1, BMPR1A, BRCA1, BRCA2, BRIP1, CDH1, CDK4, CDKN2A, CHEK2, DICER1, EPCAM, GREM1, HOXB13, MEN1, MLH1, MSH2, MSH3, MSH6, MUTYH, NBN, NF1, NF2, NTHL1, PALB2, PMS2, POLD1, POLE, PTEN, RAD51C, RAD51D, RECQL, RET, SDHA, SDHAF2, SDHB, SDHC, SDHD, SMAD4, SMARCA4, STK11, TP53, TSC1, TSC2, and VHL.  RNA data is routinely analyzed for use in variant interpretation for all genes.       INTERVAL HISTORY:  Barbara Valdez is here for a follow up of colon  cancer. She was last seen by me on 08/12/21. She presents to the clinic alone. She reports some chest pain/tightness following infusion. She also reports diarrhea, requiring her to empty her colostomy bag 5 times a day. She reports stable tingling and skin peeling in her hands and feet.   All other systems were reviewed with the patient and are negative.  MEDICAL HISTORY:  Past Medical  History:  Diagnosis Date   Anemia    Anxiety    Colon cancer ([place]) 01/2021   stage 2   Complication of anesthesia    took a long time to wake up after surgery   Family history of breast cancer 03/30/2021   Family history of colon cancer 03/30/2021    SURGICAL HISTORY: Past Surgical History:  Procedure Laterality Date   ABDOMINAL HYSTERECTOMY Bilateral 09/26/2016   Procedure: HYSTERECTOMY ABDOMINAL W/ BILATERAL SALPINGECTOMY;  Surgeon: Emily Filbert, MD;  Location: [facility];  Service: Gynecology;  Laterality: Bilateral;   BIOPSY  02/09/2021   Procedure: BIOPSY;  Surgeon: Otis Brace, MD;  Location: [facility];  Service: Gastroenterology;;   BREAST BIOPSY Left    COLONOSCOPY N/A 02/09/2021   Procedure: COLONOSCOPY;  Surgeon: Otis Brace, MD;  Location: [facility];  Service: Gastroenterology;  Laterality: N/A;   LAPAROSCOPIC PARTIAL COLECTOMY N/A 02/10/2021   Procedure: ROBOTIC ASSISTED PARTIAL COLECTOMY WITH COLOSTOMY;  Surgeon: Johnathan Hausen, MD;  Location: [facility];  Service: General;  Laterality: N/A;   SUBMUCOSAL TATTOO INJECTION  02/09/2021   Procedure: SUBMUCOSAL TATTOO INJECTION;  Surgeon: Otis Brace, MD;  Location: [facility];  Service: Gastroenterology;;   TUBAL LIGATION      I have reviewed the social history and family history with the patient and they are unchanged from previous note.  ALLERGIES:  is allergic to ferrlecit [na ferric gluc cplx in sucrose].  MEDICATIONS:  Current Outpatient Medications  Medication Sig Dispense Refill   acetaminophen (TYLENOL) 500 MG tablet Take 2 tablets (1,000 mg total) by mouth every 6 (six) hours as needed for mild pain. (Patient not taking: Reported on 06/21/2021) 30 tablet 0   capecitabine (XELODA) 500 MG tablet TAKE 3 TABLETS BY MOUTH IN THE  MORNING AND 4 TABLETS BY MOUTH  IN THE EVENING EVERY 12 HOURS  FOR 14 DAYS THEN OFF FOR 7 DAYS  TAKE AFTER MEALS 98 tablet 0   methocarbamol (ROBAXIN) 750 MG tablet Take 1  tablet (750 mg total) by mouth every 8 (eight) hours as needed for muscle spasms. 30 tablet 0   Multiple Vitamin (MULTIVITAMIN WITH MINERALS) TABS tablet Take 1 tablet by mouth daily. 150 tablet 0   ondansetron (ZOFRAN) 8 MG tablet Take 1 tablet (8 mg total) by mouth 2 (two) times daily as needed for refractory nausea / vomiting. Start on day 3 after chemotherapy. 30 tablet 1   pantoprazole (PROTONIX) 40 MG tablet Take 40 mg by mouth daily as needed. (Patient not taking: Reported on 06/21/2021)     polyethylene glycol (MIRALAX / GLYCOLAX) 17 g packet Take 17 g by mouth daily. 14 each 0   prochlorperazine (COMPAZINE) 10 MG tablet TAKE 1 TABLET BY MOUTH EVERY 6 HOURS AS NEEDED 30 tablet 1   No current facility-administered medications for this visit.    PHYSICAL EXAMINATION: ECOG PERFORMANCE STATUS: 0 - Asymptomatic  Vitals:   09/15/21 1356  BP: 120/86  Pulse: 66  Resp: 16  Temp: 98.8 F (37.1 C)  SpO2: 100%   Wt Readings from Last 3 Encounters:  09/15/21 145  lb (65.8 kg)  08/12/21 141 lb 14.4 oz (64.4 kg)  07/22/21 140 lb 11.2 oz (63.8 kg)     GENERAL:alert, no distress and comfortable SKIN: skin color normal, no rashes or significant lesions EYES: normal, Conjunctiva are pink and non-injected, sclera clear  NEURO: alert & oriented x 3 with fluent speech  LABORATORY DATA:  I have reviewed the data as listed    Latest Ref Rng & Units 09/15/2021   12:55 PM 08/12/2021    1:36 PM 07/22/2021   12:11 PM  CBC  WBC 4.0 - 10.5 K/uL 3.0  3.3  3.7   Hemoglobin 12.0 - 15.0 g/dL 11.7  11.3  11.3   Hematocrit 36.0 - 46.0 % 36.2  35.0  36.2   Platelets 150 - 400 K/uL 242  232  315         Latest Ref Rng & Units 09/15/2021   12:55 PM 08/12/2021    1:36 PM 07/22/2021   12:11 PM  CMP  Glucose 70 - 99 mg/dL 86  105  76   BUN 6 - 20 mg/dL 13  16  15    Creatinine 0.44 - 1.00 mg/dL 0.72  0.85  0.92   Sodium 135 - 145 mmol/L 142  143  141   Potassium 3.5 - 5.1 mmol/L 4.0  3.9  4.0    Chloride 98 - 111 mmol/L 107  107  106   CO2 22 - 32 mmol/L 32  28  29   Calcium 8.9 - 10.3 mg/dL 9.9  9.9  10.1   Total Protein 6.5 - 8.1 g/dL 7.9  8.0  8.5   Total Bilirubin 0.3 - 1.2 mg/dL 0.4  0.6  0.5   Alkaline Phos 38 - 126 U/L 62  69  93   AST 15 - 41 U/L 21  24  42   ALT 0 - 44 U/L 10  12  22        RADIOGRAPHIC STUDIES: I have personally reviewed the radiological images as listed and agreed with the findings in the report. No results found.    No orders of the defined types were placed in this encounter.  All questions were answered. The patient knows to call the clinic with any problems, questions or concerns. No barriers to learning was detected. The total time spent in the appointment was 30 minutes.     Truitt Merle, MD 09/15/2021   I, Wilburn Mylar, am acting as scribe for Truitt Merle, MD.   I have reviewed the above documentation for accuracy and completeness, and I agree with the above.

## 2021-09-16 ENCOUNTER — Other Ambulatory Visit: Payer: Self-pay

## 2021-09-16 ENCOUNTER — Encounter: Payer: Self-pay | Admitting: Hematology

## 2021-09-22 ENCOUNTER — Telehealth: Payer: Self-pay

## 2021-09-22 NOTE — Telephone Encounter (Signed)
Pt LVM stating that she just recently changed insurance to [email or web address] and they are requesting prescriptions/orders for the pt's "stomach bags" (thinks she's means ostomy supplies) or the pt will need to pay $169 out of pocket for her supplies.  Tried calling the pt back but unfortunately the pt did not answer the telephone nor is her voicemail box setup.  This RN sent pt a MyChart message with the information to the [facility] in hopes that they can assist this patient.

## 2021-09-23 ENCOUNTER — Other Ambulatory Visit: Payer: Self-pay

## 2021-09-27 ENCOUNTER — Other Ambulatory Visit (HOSPITAL_COMMUNITY): Payer: Self-pay | Admitting: Nurse Practitioner

## 2021-09-27 DIAGNOSIS — K9400 Colostomy complication, unspecified: Secondary | ICD-10-CM

## 2021-09-27 NOTE — Progress Notes (Unsigned)
Patient has changed insurance to [employer] and needs new supplier for pouches.  I will enroll her with [facility]. She understands that I do not have her new insurance info on file and they will want that information when they call her.  I am leaving pouches at the front desk for her as she is running low now   2 piece  2 1/4" pouches with barrier ring.   Domenic Moras MSN, RN, FNP-BC CWON Wound, Ostomy, Continence Nurse Pager [phone]

## 2021-09-28 ENCOUNTER — Other Ambulatory Visit: Payer: Self-pay | Admitting: Hematology

## 2021-10-12 ENCOUNTER — Encounter: Payer: Self-pay | Admitting: Hematology

## 2021-10-20 ENCOUNTER — Encounter: Payer: Self-pay | Admitting: Hematology

## 2021-11-02 ENCOUNTER — Other Ambulatory Visit: Payer: Self-pay

## 2021-11-18 ENCOUNTER — Encounter (HOSPITAL_BASED_OUTPATIENT_CLINIC_OR_DEPARTMENT_OTHER): Payer: 59 | Admitting: Medical

## 2021-12-07 ENCOUNTER — Encounter (HOSPITAL_COMMUNITY): Payer: Self-pay | Admitting: Nurse Practitioner

## 2021-12-21 NOTE — Progress Notes (Unsigned)
[place]   Telephone:([phone]) [phone] [phone]   Clinic Follow up Note   Patient Care Team: Pcp, No as PCP - General Barbara Hausen, MD as Consulting Physician (General Surgery) Barbara Brace, MD as Consulting Physician (Gastroenterology) Barbara Merle, MD as Consulting Physician (Hematology)  Date of Service:  12/22/2021  CHIEF COMPLAINT: f/u of sigmoid colon cancer   CURRENT THERAPY:   Adjuvant Xeloda, q21d, starting 03/14/21             -dose: 1535m AM, 20093mPM days 1-14  ASSESSMENT:  Barbara Valdez a [age].o. female with    1. 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 MaHassell DonePathology showed 9.5 cm invasive moderately differentiated adenocarcinoma to sigmoid colon. Margins and lymph nodes negative. -she received adjuvant CAPOX 03/14/21 - 05/23/21 to reduce her risk of recurrence. she had additional Xeloda alone to complete 6 months of treatment.  -surveillance colonoscopy on 06/21/21 with Dr. DoLorenso Valdez unremarkable. Plan for repeat in 1 year. -restaging CT AP on 08/11/21 showed: a soft tissue nodule favored to be related to left ovary, but adenopathy not completely excluded. -She is clinically doing well, no concern for recurrence.  2.  Iron deficiency anemia -likely secondary to #1 -she received B12 and IV Ferrlecit on 11/29/20 and IV Feraheme on 02/15/21. -overall stable in 11-range now   3. Genetics -she has a family history of breast cancer, colon cancer, and lymphoma. -testing on 03/28/21 was negative  No problem-specific Assessment & Plan notes found for this encounter.     PLAN: -Discuss Pt returning to work, will write a letter for her  -Scan in 2 mths. -lab reviewed Calcium elevated, low calcium diet  -Pt will call and schedule her CT  -F/u in 2 months with lab and CT cap w contrast a few days before    SUMMARY OF ONCOLOGIC  HISTORY: Oncology History Overview Note   Cancer Staging  [facility]: Colon and Rectum, AJCC 8th Edition - Pathologic stage from 02/10/2021: Stage IIB (pT4a, pN0, cM0) - Signed by FeTruitt MerleMD on 02/28/2021    Cancer of sigmoid colon ([facility] 11/27/2020 Imaging   CLINICAL DATA:  Abdominal pain that is began Monday. Patient reports seen scant amounts of dark blood in stool. Feels bloated.   EXAM: CT ABDOMEN AND PELVIS WITH CONTRAST  IMPRESSION: 1. Proctocolitis involving the mid and distal sigmoid colon and upper rectum with a 3.9 cm gas containing interloop abscess centered in the sigmoid mesocolon. 2. Severe fecal burden.  No findings of bowel obstruction. 3. An enlarged left common iliac lymph node is likely reactive. 4. A 2.7 cm hemangioma is noted in the right hepatic lobe. 5. Mild focal bronchiolitis in the right lower lobe.   02/07/2021 Imaging   CLINICAL DATA:  Abdominal pain, cramping, constipation   EXAM: CT ABDOMEN AND PELVIS WITH CONTRAST  IMPRESSION: 1. There is severe, circumferential wall thickening and mucosal hyperenhancement involving the distal sigmoid colon and rectum to the anus, generally tethered appearance unchanged, involving at least 15-20 cm of distal sigmoid and rectum. 2. There is again seen an abscess at the medial aspect of the rectosigmoid junction measuring approximately 3.0 x 1.6 cm. 3. Findings are generally consistent with nonspecific infectious, inflammatory, or ischemic colitis, including inflammatory bowel disease such as Crohn's disease or ulcerative colitis; however an underlying malignancy is very difficult to exclude,  particularly given substantial wall thickening. 4. Large burden of stool throughout the extremely redundant proximal colon, likely reflecting some degree of obstipation. 5. Enlarged left external iliac lymph nodes measuring up to 1.1 x 0.9 cm fall, nonspecific, possibly reactive, however  nodal metastatic disease is not excluded per above.   02/09/2021 Initial Biopsy   FINAL MICROSCOPIC DIAGNOSIS:   A. COLON MASS, SIGMOID, BIOPSY:  - Adenomatous lesion with extensive high-grade dysplasia, see comment   B. RECTAL, COLON, SIGMOID, BIOPSY:  - Mild acute/ subacute nonspecific colitis, see comment  - Negative for definite features of chronicity, granulomas or dysplasia   COMMENT:  A.  Evidence of invasive carcinoma is not seen in the submitted biopsies but the biopsy fragments appear superficial and a more severe deeper process cannot be ruled out.  B.  Histologic features in the rectosigmoid biopsies are not compatible with untreated inflammatory bowel disease.  Differential diagnosis can include infection, drug-effect and stercoral proctitis among other possibilities.    02/09/2021 Procedure   Colonoscopy, Dr. Alessandra Valdez  Impression: - Preparation of the colon was fair. - Likely malignant partially obstructing tumor in the distal sigmoid colon. Biopsied. Tattooed. - Congested, inflamed and thickened folds of the mucosa in the recto-sigmoid colon. Biopsied. Tattooed.   02/10/2021 Cancer Staging   Staging form: Colon and Rectum, AJCC 8th Edition - Pathologic stage from 02/10/2021: Stage IIB (pT4a, pN0, cM0) - Signed by Barbara Merle, MD on 02/28/2021 Stage prefix: Initial diagnosis Total positive nodes: 0 Histologic grading system: 4 grade system Histologic grade (G): G2 Residual tumor (R): R0 - None   02/10/2021 Definitive Surgery   FINAL MICROSCOPIC DIAGNOSIS:   A. COLON, SIGMOID:  - Invasive moderately differentiated adenocarcinoma.  - Twenty-seven lymph nodes, negative for metastatic carcinoma (0/27).  - See oncology table below.   B. COLON, DISTAL, SIGMOID:  - Segment of colon with serosal adhesions, acute inflammation, and tattoo pigment.  - Negative for malignancy.    02/11/2021 Initial Diagnosis   Cancer of sigmoid colon ([place])   03/14/2021 - 05/23/2021  Chemotherapy   Patient is on Treatment Plan : COLORECTAL Xelox ([place]) q21d     04/20/2021 Genetic Testing   Negative hereditary cancer genetic testing: no pathogenic variants detected in [facility] CustomNext-Cancer +RNAinsight Panel.  Report date is April 20, 2021.   The CustomNext-Cancer+RNAinsight panel offered by [facility] includes sequencing and rearrangement analysis for the following 47 genes:  APC, ATM, AXIN2, BARD1, BMPR1A, BRCA1, BRCA2, BRIP1, CDH1, CDK4, CDKN2A, CHEK2, DICER1, EPCAM, GREM1, HOXB13, MEN1, MLH1, MSH2, MSH3, MSH6, MUTYH, NBN, NF1, NF2, NTHL1, PALB2, PMS2, POLD1, POLE, PTEN, RAD51C, RAD51D, RECQL, RET, SDHA, SDHAF2, SDHB, SDHC, SDHD, SMAD4, SMARCA4, STK11, TP53, TSC1, TSC2, and VHL.  RNA data is routinely analyzed for use in variant interpretation for all genes.       INTERVAL HISTORY:  Barbara Valdez is here for a follow up of  colon cancer She was last seen by me on 09/15/2021 She presents to the clinic alone.Pt states she is doing well. Pt denies pain.Pt wanted to know was she able to go back to work.   All other systems were reviewed with the patient and are negative.  MEDICAL HISTORY:  Past Medical History:  Diagnosis Date   Anemia    Anxiety    Colon cancer ([place]) 01/2021   stage 2   Complication of anesthesia    took a long time to wake up after surgery   Family history of breast cancer 03/30/2021   Family history  of colon cancer 03/30/2021    SURGICAL HISTORY: Past Surgical History:  Procedure Laterality Date   ABDOMINAL HYSTERECTOMY Bilateral 09/26/2016   Procedure: HYSTERECTOMY ABDOMINAL W/ BILATERAL SALPINGECTOMY;  Surgeon: Emily Filbert, MD;  Location: [facility];  Service: Gynecology;  Laterality: Bilateral;   BIOPSY  02/09/2021   Procedure: BIOPSY;  Surgeon: Barbara Brace, MD;  Location: [facility];  Service: Gastroenterology;;   BREAST BIOPSY Left    COLONOSCOPY N/A 02/09/2021   Procedure: COLONOSCOPY;  Surgeon: Barbara Brace, MD;   Location: [facility];  Service: Gastroenterology;  Laterality: N/A;   LAPAROSCOPIC PARTIAL COLECTOMY N/A 02/10/2021   Procedure: ROBOTIC ASSISTED PARTIAL COLECTOMY WITH COLOSTOMY;  Surgeon: Barbara Hausen, MD;  Location: [facility];  Service: General;  Laterality: N/A;   SUBMUCOSAL TATTOO INJECTION  02/09/2021   Procedure: SUBMUCOSAL TATTOO INJECTION;  Surgeon: Barbara Brace, MD;  Location: [facility];  Service: Gastroenterology;;   TUBAL LIGATION      I have reviewed the social history and family history with the patient and they are unchanged from previous note.  ALLERGIES:  is allergic to ferrlecit [na ferric gluc cplx in sucrose].  MEDICATIONS:  Current Outpatient Medications  Medication Sig Dispense Refill   acetaminophen (TYLENOL) 500 MG tablet Take 2 tablets (1,000 mg total) by mouth every 6 (six) hours as needed for mild pain. (Patient not taking: Reported on 06/21/2021) 30 tablet 0   capecitabine (XELODA) 500 MG tablet TAKE 3 TABLETS BY MOUTH IN THE  MORNING AND 4 TABLETS BY MOUTH  IN THE EVENING EVERY 12 HOURS  FOR 14 DAYS THEN OFF FOR 7 DAYS  TAKE AFTER MEALS 98 tablet 0   methocarbamol (ROBAXIN) 750 MG tablet Take 1 tablet (750 mg total) by mouth every 8 (eight) hours as needed for muscle spasms. 30 tablet 0   Multiple Vitamin (MULTIVITAMIN WITH MINERALS) TABS tablet Take 1 tablet by mouth daily. 150 tablet 0   ondansetron (ZOFRAN) 8 MG tablet Take 1 tablet (8 mg total) by mouth 2 (two) times daily as needed for refractory nausea / vomiting. Start on day 3 after chemotherapy. 30 tablet 1   pantoprazole (PROTONIX) 40 MG tablet Take 40 mg by mouth daily as needed. (Patient not taking: Reported on 06/21/2021)     polyethylene glycol (MIRALAX / GLYCOLAX) 17 g packet Take 17 g by mouth daily. 14 each 0   prochlorperazine (COMPAZINE) 10 MG tablet TAKE 1 TABLET BY MOUTH EVERY 6 HOURS AS NEEDED 30 tablet 1   No current facility-administered medications for this visit.    PHYSICAL  EXAMINATION: ECOG PERFORMANCE STATUS: 0 - Asymptomatic  Vitals:   12/22/21 1317  BP: (!) 124/90  Pulse: 79  Resp: 18  Temp: 98.3 F (36.8 C)  SpO2: 100%   Wt Readings from Last 3 Encounters:  12/22/21 157 lb 3.2 oz (71.3 kg)  09/15/21 145 lb (65.8 kg)  08/12/21 141 lb 14.4 oz (64.4 kg)    GENERAL:alert, no distress and comfortable SKIN: skin color, texture, turgor are normal, no rashes or significant lesions NECK: supple, thyroid normal size, non-tender, without nodularity LYMPH:  no palpable lymphadenopathy in the cervical, axillary (-) LUNGS: clear to auscultation and percussion with normal breathing effort(-) HEART: regular rate & rhythm and no murmurs and no lower extremity edema(-) ABDOMEN:abdomen soft, non-tender and normal bowel sounds Musculoskeletal:no cyanosis of digits and no clubbing  NEURO: alert & oriented x 3 with fluent speech, no focal motor/sensory deficits  LABORATORY DATA:  I have reviewed the data as  listed    Latest Ref Rng & Units 12/22/2021   12:56 PM 09/15/2021   12:55 PM 08/12/2021    1:36 PM  CBC  WBC 4.0 - 10.5 K/uL 3.7  3.0  3.3   Hemoglobin 12.0 - 15.0 g/dL 12.2  11.7  11.3   Hematocrit 36.0 - 46.0 % 38.9  36.2  35.0   Platelets 150 - 400 K/uL 273  242  232         Latest Ref Rng & Units 12/22/2021   12:56 PM 09/15/2021   12:55 PM 08/12/2021    1:36 PM  CMP  Glucose 70 - 99 mg/dL 90  86  105   BUN 6 - 20 mg/dL _0 Creatinine 0.44 - 1.00 mg/dL 0.79  0.72  0.85   Sodium 135 - 145 mmol/L 139  142  143   Potassium 3.5 - 5.1 mmol/L 4.4  4.0  3.9   Chloride 98 - 111 mmol/L 103  107  107   CO2 22 - 32 mmol/L 30  32  28   Calcium 8.9 - 10.3 mg/dL 10.7  9.9  9.9   Total Protein 6.5 - 8.1 g/dL 9.0  7.9  8.0   Total Bilirubin 0.3 - 1.2 mg/dL 0.5  0.4  0.6   Alkaline Phos 38 - 126 U/L 70  62  69   AST 15 - 41 U/L _1 ALT 0 - 44 U/L _2 RADIOGRAPHIC STUDIES: I have personally reviewed the radiological images  as listed and agreed with the findings in the report. No results found.    Orders Placed This Encounter  Procedures   CT CHEST ABDOMEN PELVIS W CONTRAST    Standing Status:   Future    Standing Expiration Date:   12/23/2022    Order Specific Question:   Is patient pregnant?    Answer:   No    Order Specific Question:   Preferred imaging location?    Answer:   [facility]    Order Specific Question:   Release to patient    Answer:   Immediate    Order Specific Question:   Is Oral Contrast requested for this exam?    Answer:   Yes, Per Radiology protocol   All questions were answered. The patient knows to call the clinic with any problems, questions or concerns. No barriers to learning was detected. The total time spent in the appointment was 30 minutes.     Barbara Merle, MD 12/22/2021   I, Audry Riles, CMA, am acting as scribe for Barbara Merle, MD.   I have reviewed the above documentation for accuracy and completeness, and I agree with the above.

## 2021-12-22 ENCOUNTER — Other Ambulatory Visit: Payer: Self-pay

## 2021-12-22 ENCOUNTER — Inpatient Hospital Stay: Payer: PRIVATE HEALTH INSURANCE

## 2021-12-22 ENCOUNTER — Inpatient Hospital Stay: Payer: PRIVATE HEALTH INSURANCE | Attending: Hematology | Admitting: Hematology

## 2021-12-22 ENCOUNTER — Encounter: Payer: Self-pay | Admitting: Hematology

## 2021-12-22 VITALS — BP 124/90 | HR 79 | Temp 98.3°F | Resp 18 | Ht 65.0 in | Wt 157.2 lb

## 2021-12-22 DIAGNOSIS — Z9049 Acquired absence of other specified parts of digestive tract: Secondary | ICD-10-CM | POA: Insufficient documentation

## 2021-12-22 DIAGNOSIS — Z800 Family history of malignant neoplasm of digestive organs: Secondary | ICD-10-CM | POA: Insufficient documentation

## 2021-12-22 DIAGNOSIS — C187 Malignant neoplasm of sigmoid colon: Secondary | ICD-10-CM

## 2021-12-22 DIAGNOSIS — Z803 Family history of malignant neoplasm of breast: Secondary | ICD-10-CM | POA: Diagnosis not present

## 2021-12-22 DIAGNOSIS — Z807 Family history of other malignant neoplasms of lymphoid, hematopoietic and related tissues: Secondary | ICD-10-CM | POA: Insufficient documentation

## 2021-12-22 DIAGNOSIS — Z85038 Personal history of other malignant neoplasm of large intestine: Secondary | ICD-10-CM | POA: Diagnosis present

## 2021-12-22 DIAGNOSIS — D500 Iron deficiency anemia secondary to blood loss (chronic): Secondary | ICD-10-CM

## 2021-12-22 DIAGNOSIS — D509 Iron deficiency anemia, unspecified: Secondary | ICD-10-CM | POA: Diagnosis not present

## 2021-12-22 LAB — COMPREHENSIVE METABOLIC PANEL
ALT: 11 U/L (ref 0–44)
AST: 22 U/L (ref 15–41)
Albumin: 4.8 g/dL (ref 3.5–5.0)
Alkaline Phosphatase: 70 U/L (ref 38–126)
Anion gap: 6 (ref 5–15)
BUN: 15 mg/dL (ref 6–20)
CO2: 30 mmol/L (ref 22–32)
Calcium: 10.7 mg/dL — ABNORMAL HIGH (ref 8.9–10.3)
Chloride: 103 mmol/L (ref 98–111)
Creatinine, Ser: 0.79 mg/dL (ref 0.44–1.00)
GFR, Estimated: >60 mL/min (ref >60)
Glucose, Bld: 90 mg/dL (ref 70–99)
Potassium: 4.4 mmol/L (ref 3.5–5.1)
Sodium: 139 mmol/L (ref 135–145)
Total Bilirubin: 0.5 mg/dL (ref 0.3–1.2)
Total Protein: 9 g/dL — ABNORMAL HIGH (ref 6.5–8.1)

## 2021-12-22 LAB — CBC WITH DIFFERENTIAL/PLATELET
Abs Immature Granulocytes: 0.01 10*3/uL (ref 0.00–0.07)
Basophils Absolute: 0 10*3/uL (ref 0.0–0.1)
Basophils Relative: 1 %
Eosinophils Absolute: 0.1 10*3/uL (ref 0.0–0.5)
Eosinophils Relative: 2 %
HCT: 38.9 % (ref 36.0–46.0)
Hemoglobin: 12.2 g/dL (ref 12.0–15.0)
Immature Granulocytes: 0 %
Lymphocytes Relative: 42 %
Lymphs Abs: 1.5 10*3/uL (ref 0.7–4.0)
MCH: 26.9 pg (ref 26.0–34.0)
MCHC: 31.4 g/dL (ref 30.0–36.0)
MCV: 85.9 fL (ref 80.0–100.0)
Monocytes Absolute: 0.4 10*3/uL (ref 0.1–1.0)
Monocytes Relative: 10 %
Neutro Abs: 1.7 10*3/uL (ref 1.7–7.7)
Neutrophils Relative %: 45 %
Platelets: 273 10*3/uL (ref 150–400)
RBC: 4.53 MIL/uL (ref 3.87–5.11)
RDW: 14.2 % (ref 11.5–15.5)
WBC: 3.7 10*3/uL — ABNORMAL LOW (ref 4.0–10.5)
nRBC: 0 % (ref 0.0–0.2)

## 2021-12-22 LAB — FERRITIN: Ferritin: 59 ng/mL (ref 11–307)

## 2021-12-27 ENCOUNTER — Other Ambulatory Visit: Payer: Self-pay

## 2022-02-16 ENCOUNTER — Other Ambulatory Visit: Payer: Self-pay

## 2022-02-16 ENCOUNTER — Inpatient Hospital Stay: Payer: PRIVATE HEALTH INSURANCE | Attending: Hematology

## 2022-02-16 DIAGNOSIS — Z85038 Personal history of other malignant neoplasm of large intestine: Secondary | ICD-10-CM | POA: Diagnosis not present

## 2022-02-16 DIAGNOSIS — C187 Malignant neoplasm of sigmoid colon: Secondary | ICD-10-CM

## 2022-02-16 DIAGNOSIS — D500 Iron deficiency anemia secondary to blood loss (chronic): Secondary | ICD-10-CM

## 2022-02-16 LAB — COMPREHENSIVE METABOLIC PANEL
ALT: 9 U/L (ref 0–44)
AST: 20 U/L (ref 15–41)
Albumin: 4.1 g/dL (ref 3.5–5.0)
Alkaline Phosphatase: 73 U/L (ref 38–126)
Anion gap: 6 (ref 5–15)
BUN: 13 mg/dL (ref 6–20)
CO2: 29 mmol/L (ref 22–32)
Calcium: 10 mg/dL (ref 8.9–10.3)
Chloride: 105 mmol/L (ref 98–111)
Creatinine, Ser: 0.91 mg/dL (ref 0.44–1.00)
GFR, Estimated: >60 mL/min (ref >60)
Glucose, Bld: 87 mg/dL (ref 70–99)
Potassium: 4 mmol/L (ref 3.5–5.1)
Sodium: 140 mmol/L (ref 135–145)
Total Bilirubin: 0.5 mg/dL (ref 0.3–1.2)
Total Protein: 8.4 g/dL — ABNORMAL HIGH (ref 6.5–8.1)

## 2022-02-16 LAB — CBC WITH DIFFERENTIAL/PLATELET
Abs Immature Granulocytes: 0.01 10*3/uL (ref 0.00–0.07)
Basophils Absolute: 0 10*3/uL (ref 0.0–0.1)
Basophils Relative: 1 %
Eosinophils Absolute: 0.1 10*3/uL (ref 0.0–0.5)
Eosinophils Relative: 3 %
HCT: 38 % (ref 36.0–46.0)
Hemoglobin: 11.9 g/dL — ABNORMAL LOW (ref 12.0–15.0)
Immature Granulocytes: 0 %
Lymphocytes Relative: 32 %
Lymphs Abs: 1.4 10*3/uL (ref 0.7–4.0)
MCH: 26.2 pg (ref 26.0–34.0)
MCHC: 31.3 g/dL (ref 30.0–36.0)
MCV: 83.7 fL (ref 80.0–100.0)
Monocytes Absolute: 0.5 10*3/uL (ref 0.1–1.0)
Monocytes Relative: 11 %
Neutro Abs: 2.3 10*3/uL (ref 1.7–7.7)
Neutrophils Relative %: 53 %
Platelets: 220 10*3/uL (ref 150–400)
RBC: 4.54 MIL/uL (ref 3.87–5.11)
RDW: 14.1 % (ref 11.5–15.5)
WBC: 4.3 10*3/uL (ref 4.0–10.5)
nRBC: 0 % (ref 0.0–0.2)

## 2022-02-16 LAB — FERRITIN: Ferritin: 60 ng/mL (ref 11–307)

## 2022-02-17 ENCOUNTER — Ambulatory Visit (HOSPITAL_COMMUNITY): Payer: PRIVATE HEALTH INSURANCE

## 2022-02-19 NOTE — Assessment & Plan Note (Deleted)
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 Hassell Done. 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 Margins and lymph nodes negative. -she received adjuvant CAPOX 03/14/21 - 05/23/21 to reduce her risk of recurrence. she had additional Xeloda alone to complete 6 months of treatment.  -She is on colon cancer surveillance now, doing well clinically.

## 2022-02-20 ENCOUNTER — Telehealth: Payer: Self-pay | Admitting: Hematology

## 2022-02-20 ENCOUNTER — Other Ambulatory Visit: Payer: Self-pay | Admitting: Hematology

## 2022-02-20 ENCOUNTER — Inpatient Hospital Stay: Payer: PRIVATE HEALTH INSURANCE | Admitting: Hematology

## 2022-02-20 ENCOUNTER — Ambulatory Visit (INDEPENDENT_AMBULATORY_CARE_PROVIDER_SITE_OTHER): Payer: Medicaid Other

## 2022-02-20 DIAGNOSIS — C187 Malignant neoplasm of sigmoid colon: Secondary | ICD-10-CM

## 2022-02-20 NOTE — Progress Notes (Deleted)
[place]   [phone] [phone]   Clinic Follow up Note   Patient Care Team: Pcp, No as PCP - General Johnathan Hausen, MD as Consulting Physician (General Surgery) Otis Brace, MD as Consulting Physician (Gastroenterology) Truitt Merle, MD as Consulting Physician (Hematology)  Date of Service:  02/20/2022  CHIEF COMPLAINT: f/u of {diagnosis, copy from prior note}  CURRENT THERAPY:  {Usually copy/paste from prior note, but pay attention to if treatment changes. Or change to Surveillance if they finished treatment}  ASSESSMENT: *** Barbara Valdez is a 47 y.o. female with   No problem-specific Assessment & Plan notes found for this encounter.  ***   PLAN: {Everything Dr. Burr Medico talks to pt about, including reviewing scans and labs. 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 Patient reports seen scant amounts of dark blood in stool. Feels bloated.   EXAM: CT ABDOMEN AND PELVIS WITH CONTRAST  IMPRESSION: 1. Proctocolitis involving the mid and distal sigmoid colon and upper rectum with a 3.9 cm gas containing interloop abscess centered in the sigmoid mesocolon. 2. Severe fecal burden.  No findings of bowel obstruction. 3. An enlarged left common iliac lymph node is likely reactive. 4. A 2.7 cm hemangioma is noted in the right hepatic lobe. 5. Mild focal bronchiolitis in the right lower lobe.   02/07/2021 Imaging   CLINICAL DATA:  Abdominal pain, cramping, constipation   EXAM: CT ABDOMEN AND PELVIS WITH CONTRAST  IMPRESSION: 1. There is severe,  circumferential wall thickening and mucosal hyperenhancement involving the distal sigmoid colon and rectum to the anus, generally tethered appearance unchanged, involving at least 15-20 cm of distal sigmoid and rectum. 2. There is again seen an abscess at the medial aspect of the rectosigmoid junction measuring approximately 3.0 x 1.6 cm. 3. Findings are generally consistent with nonspecific infectious, inflammatory, or ischemic colitis, including inflammatory bowel disease such as Crohn's disease or ulcerative colitis; however an underlying malignancy is very difficult to exclude, particularly given substantial wall thickening. 4. Large burden of stool throughout the extremely redundant proximal colon, likely reflecting some degree of obstipation. 5. Enlarged left external iliac lymph nodes measuring up to 1.1 x 0.9 cm fall, nonspecific, possibly reactive, however nodal metastatic disease is not excluded per above.   02/09/2021 Initial Biopsy   FINAL MICROSCOPIC DIAGNOSIS:   A. COLON MASS, SIGMOID, BIOPSY:  - Adenomatous lesion with extensive high-grade dysplasia, see comment   B. RECTAL, COLON, SIGMOID, BIOPSY:  - Mild acute/ subacute nonspecific colitis, see comment  - Negative for definite features of chronicity, granulomas or dysplasia   COMMENT:  A.  Evidence of invasive carcinoma is not seen in the submitted biopsies but the biopsy fragments appear superficial and a more severe deeper process cannot be ruled out.  B.  Histologic features in the rectosigmoid biopsies are not compatible with untreated inflammatory bowel disease.  Differential diagnosis can include infection, drug-effect and stercoral proctitis among other possibilities.    02/09/2021 Procedure   Colonoscopy, Dr. Alessandra Bevels  Impression: - Preparation of the colon was fair. - Likely malignant partially obstructing tumor  in the distal sigmoid colon. Biopsied. Tattooed. - Congested, inflamed and thickened folds of  the mucosa in the recto-sigmoid colon. Biopsied. Tattooed.   02/10/2021 Cancer Staging   Staging form: Colon and Rectum, AJCC 8th Edition - Pathologic stage from 02/10/2021: Stage IIB (pT4a, pN0, cM0) - Signed by Truitt Merle, MD on 02/28/2021 Stage prefix: Initial diagnosis Total positive nodes: 0 Histologic grading system: 4 grade system Histologic grade (G): G2 Residual tumor (R): R0 - None   02/10/2021 Definitive Surgery   FINAL MICROSCOPIC DIAGNOSIS:   A. COLON, SIGMOID:  - Invasive moderately differentiated adenocarcinoma.  - Twenty-seven lymph nodes, negative for metastatic carcinoma (0/27).  - See oncology table below.   B. COLON, DISTAL, SIGMOID:  - Segment of colon with serosal adhesions, acute inflammation, and tattoo pigment.  - Negative for malignancy.    02/11/2021 Initial Diagnosis   Cancer of sigmoid colon ([place])   03/14/2021 - 05/23/2021 Chemotherapy   Patient is on Treatment Plan : COLORECTAL Xelox ([place]ox) q21d     04/20/2021 Genetic Testing   Negative hereditary cancer genetic testing: no pathogenic variants detected in [facility] CustomNext-Cancer +RNAinsight Panel.  Report date is April 20, 2021.   The CustomNext-Cancer+RNAinsight panel offered by [facility] includes sequencing and rearrangement analysis for the following 47 genes:  APC, ATM, AXIN2, BARD1, BMPR1A, BRCA1, BRCA2, BRIP1, CDH1, CDK4, CDKN2A, CHEK2, DICER1, EPCAM, GREM1, HOXB13, MEN1, MLH1, MSH2, MSH3, MSH6, MUTYH, NBN, NF1, NF2, NTHL1, PALB2, PMS2, POLD1, POLE, PTEN, RAD51C, RAD51D, RECQL, RET, SDHA, SDHAF2, SDHB, SDHC, SDHD, SMAD4, SMARCA4, STK11, TP53, TSC1, TSC2, and VHL.  RNA data is routinely analyzed for use in variant interpretation for all genes.       INTERVAL HISTORY: *** Barbara Valdez is here for a follow up of {diagnosis} She was last seen by {provider} on {date} She presents to the clinic {alone/accompanied by}. {Everything the pt says, basically. How they're feeling, complaints and concerns,  etc}   All other systems were reviewed with the patient and are negative.  MEDICAL HISTORY:  Past Medical History:  Diagnosis Date   Anemia    Anxiety    Colon cancer ([place]) 01/2021   stage 2   Complication of anesthesia    took a long time to wake up after surgery   Family history of breast cancer 03/30/2021   Family history of colon cancer 03/30/2021    SURGICAL HISTORY: Past Surgical History:  Procedure Laterality Date   ABDOMINAL HYSTERECTOMY Bilateral 09/26/2016   Procedure: HYSTERECTOMY ABDOMINAL W/ BILATERAL SALPINGECTOMY;  Surgeon: Emily Filbert, MD;  Location: [facility];  Service: Gynecology;  Laterality: Bilateral;   BIOPSY  02/09/2021   Procedure: BIOPSY;  Surgeon: Otis Brace, MD;  Location: [facility];  Service: Gastroenterology;;   BREAST BIOPSY Left    COLONOSCOPY N/A 02/09/2021   Procedure: COLONOSCOPY;  Surgeon: Otis Brace, MD;  Location: [facility];  Service: Gastroenterology;  Laterality: N/A;   LAPAROSCOPIC PARTIAL COLECTOMY N/A 02/10/2021   Procedure: ROBOTIC ASSISTED PARTIAL COLECTOMY WITH COLOSTOMY;  Surgeon: Johnathan Hausen, MD;  Location: [facility];  Service: General;  Laterality: N/A;   SUBMUCOSAL TATTOO INJECTION  02/09/2021   Procedure: SUBMUCOSAL TATTOO INJECTION;  Surgeon: Otis Brace, MD;  Location: [facility];  Service: Gastroenterology;;   TUBAL LIGATION      I have reviewed the social history and family history with the patient and they are unchanged from previous note.  ALLERGIES:  is allergic to ferrlecit [na ferric gluc cplx in sucrose].  MEDICATIONS:  Current Outpatient  Medications  Medication Sig Dispense Refill   acetaminophen (TYLENOL) 500 MG tablet Take 2 tablets (1,000 mg total) by mouth every 6 (six) hours as needed for mild pain. (Patient not taking: Reported on 06/21/2021) 30 tablet 0   capecitabine (XELODA) 500 MG tablet TAKE 3 TABLETS BY MOUTH IN THE  MORNING AND 4 TABLETS BY MOUTH  IN THE EVENING EVERY 12 HOURS  FOR  14 DAYS THEN OFF FOR 7 DAYS  TAKE AFTER MEALS 98 tablet 0   methocarbamol (ROBAXIN) 750 MG tablet Take 1 tablet (750 mg total) by mouth every 8 (eight) hours as needed for muscle spasms. 30 tablet 0   Multiple Vitamin (MULTIVITAMIN WITH MINERALS) TABS tablet Take 1 tablet by mouth daily. 150 tablet 0   ondansetron (ZOFRAN) 8 MG tablet Take 1 tablet (8 mg total) by mouth 2 (two) times daily as needed for refractory nausea / vomiting. Start on day 3 after chemotherapy. 30 tablet 1   pantoprazole (PROTONIX) 40 MG tablet Take 40 mg by mouth daily as needed. (Patient not taking: Reported on 06/21/2021)     polyethylene glycol (MIRALAX / GLYCOLAX) 17 g packet Take 17 g by mouth daily. 14 each 0   prochlorperazine (COMPAZINE) 10 MG tablet TAKE 1 TABLET BY MOUTH EVERY 6 HOURS AS NEEDED 30 tablet 1   No current facility-administered medications for this visit.    PHYSICAL EXAMINATION: ECOG PERFORMANCE STATUS: {CHL ONC ECOG PS:[id number]}  There were no vitals filed for this visit. Wt Readings from Last 3 Encounters:  12/22/21 157 lb 3.2 oz (71.3 kg)  09/15/21 145 lb (65.8 kg)  08/12/21 141 lb 14.4 oz (64.4 kg)    {Only keep what was examined. If exam not performed, can use .CEXAM } GENERAL:alert, no distress and comfortable SKIN: skin color, texture, turgor are normal, no rashes or significant lesions EYES: normal, Conjunctiva are pink and non-injected, sclera clear {OROPHARYNX:no exudate, no erythema and lips, buccal mucosa, and tongue normal}  NECK: supple, thyroid normal size, non-tender, without nodularity LYMPH:  no palpable lymphadenopathy in the cervical, axillary {or inguinal} LUNGS: clear to auscultation and percussion with normal breathing effort HEART: regular rate & rhythm and no murmurs and no lower extremity edema ABDOMEN:abdomen soft, non-tender and normal bowel sounds Musculoskeletal:no cyanosis of digits and no clubbing  NEURO: alert & oriented x 3 with fluent speech, no focal  motor/sensory deficits  LABORATORY DATA:  I have reviewed the data as listed    Latest Ref Rng & Units 02/16/2022   10:04 AM 12/22/2021   12:56 PM 09/15/2021   12:55 PM  CBC  WBC 4.0 - 10.5 K/uL 4.3  3.7  3.0   Hemoglobin 12.0 - 15.0 g/dL 11.9  12.2  11.7   Hematocrit 36.0 - 46.0 % 38.0  38.9  36.2   Platelets 150 - 400 K/uL 220  273  242         Latest Ref Rng & Units 02/16/2022   10:04 AM 12/22/2021   12:56 PM 09/15/2021   12:55 PM  CMP  Glucose 70 - 99 mg/dL 87  90  86   BUN 6 - 20 mg/dL '13  15  13   '$ Creatinine 0.44 - 1.00 mg/dL 0.91  0.79  0.72   Sodium 135 - 145 mmol/L 140  139  142   Potassium 3.5 - 5.1 mmol/L 4.0  4.4  4.0   Chloride 98 - 111 mmol/L 105  103  107   CO2 22 - 32 mmol/L  29  30  32   Calcium 8.9 - 10.3 mg/dL 10.0  10.7  9.9   Total Protein 6.5 - 8.1 g/dL 8.4  9.0  7.9   Total Bilirubin 0.3 - 1.2 mg/dL 0.5  0.5  0.4   Alkaline Phos 38 - 126 U/L 73  70  62   AST 15 - 41 U/L '20  22  21   '$ ALT 0 - 44 U/L '9  11  10       '$ RADIOGRAPHIC STUDIES: I have personally reviewed the radiological images as listed and agreed with the findings in the report. No results found.    No orders of the defined types were placed in this encounter.  All questions were answered. The patient knows to call the clinic with any problems, questions or concerns. No barriers to learning was detected. The total time spent in the appointment was {CHL ONC TIME VISIT - [id number].     Elayne Guerin, CMA 02/20/2022   I, Maurine Simmering, CMA, am acting as scribe for Truitt Merle, MD.   {Add scribe attestation statement}

## 2022-02-20 NOTE — Telephone Encounter (Signed)
Contacted patient to scheduled appointments. Patient is aware of appointments that are scheduled.   

## 2022-02-21 NOTE — Progress Notes (Unsigned)
[place]   [phone] [phone]   Clinic Follow up Note   Patient Care Team: Pcp, No as PCP - General Johnathan Hausen, MD as Consulting Physician (General Surgery) Otis Brace, MD as Consulting Physician (Gastroenterology) Truitt Merle, MD as Consulting Physician (Hematology)  Date of Service:  02/22/2022  I connected with Barbara Valdez on 02/22/2022 at 10:40 AM EST by telephone visit and verified that I am speaking with the correct person using two identifiers.  I discussed the limitations, risks, security and privacy concerns of performing an evaluation and management service by telephone and the availability of in person appointments. I also discussed with the patient that there may be a patient responsible charge related to this service. The patient expressed understanding and agreed to proceed.   Other persons participating in the visit and their role in the encounter:  No  Patient's location:  Home Provider's location:  Office  CHIEF COMPLAINT: f/u of sigmoid colon cancer    CURRENT THERAPY:    ASSESSMENT & PLAN:  Barbara Valdez is a 47 y.o. 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 Hassell Done. 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 Margins and lymph nodes negative. -she received adjuvant CAPOX 03/14/21 - 05/23/21 to reduce her risk of recurrence. she had additional Xeloda alone to complete 6 months of treatment.  -surveillance colonoscopy on 06/21/21 with Dr. Lorenso Courier was unremarkable. Plan for repeat in 1 year. -restaging CT AP on 08/11/21 showed: a soft tissue nodule favored to be related to left ovary, but adenopathy not completely excluded. -She is clinically doing well, no concern for recurrence. -She had surveillance CT scan yesterday, IV contrast was not able to get them due  to poor IV access.  It overall showed no definitive evidence of recurrence, stable soft tissue nodule in the left pelvis, felt to be left ovary.  CT scan also showed a few small subpleural nodule in her lungs, will follow-up closely. -She is clinically doing well, lab reviewed, no clinical concern for recurrence.  PLAN: - Discuss lab and CT Scan findings, no evidence of recurrence F/u with Dr. Hassell Done -.lab,f/u in 3 months with CT chest a few days before   SUMMARY OF ONCOLOGIC HISTORY: Oncology History Overview Note   Cancer Staging  Cancer of sigmoid colon [facility]) Staging form: Colon and Rectum, AJCC 8th Edition - Pathologic stage from 02/10/2021: Stage IIB (pT4a, pN0, cM0) - Signed by Truitt Merle, MD on 02/28/2021    Cancer of sigmoid colon ([place])  11/27/2020 Imaging   CLINICAL DATA:  Abdominal pain that is began Monday. Patient reports seen scant amounts of dark blood in stool. Feels bloated.   EXAM: CT ABDOMEN AND PELVIS WITH CONTRAST  IMPRESSION: 1. Proctocolitis involving the mid and distal sigmoid colon and upper rectum with a 3.9 cm gas containing interloop abscess centered in the sigmoid mesocolon. 2. Severe fecal burden.  No findings of bowel obstruction. 3. An enlarged left common iliac lymph node is likely reactive. 4. A 2.7 cm hemangioma is noted in the right hepatic lobe. 5. Mild focal bronchiolitis in the right lower lobe.   02/07/2021 Imaging   CLINICAL DATA:  Abdominal pain, cramping, constipation   EXAM: CT ABDOMEN AND PELVIS WITH CONTRAST  IMPRESSION: 1. There is severe, circumferential wall thickening and mucosal hyperenhancement involving the distal sigmoid colon and rectum to the anus,  generally tethered appearance unchanged, involving at least 15-20 cm of distal sigmoid and rectum. 2. There is again seen an abscess at the medial aspect of the rectosigmoid junction measuring approximately 3.0 x 1.6 cm. 3. Findings are generally consistent with nonspecific  infectious, inflammatory, or ischemic colitis, including inflammatory bowel disease such as Crohn's disease or ulcerative colitis; however an underlying malignancy is very difficult to exclude, particularly given substantial wall thickening. 4. Large burden of stool throughout the extremely redundant proximal colon, likely reflecting some degree of obstipation. 5. Enlarged left external iliac lymph nodes measuring up to 1.1 x 0.9 cm fall, nonspecific, possibly reactive, however nodal metastatic disease is not excluded per above.   02/09/2021 Initial Biopsy   FINAL MICROSCOPIC DIAGNOSIS:   A. COLON MASS, SIGMOID, BIOPSY:  - Adenomatous lesion with extensive high-grade dysplasia, see comment   B. RECTAL, COLON, SIGMOID, BIOPSY:  - Mild acute/ subacute nonspecific colitis, see comment  - Negative for definite features of chronicity, granulomas or dysplasia   COMMENT:  A.  Evidence of invasive carcinoma is not seen in the submitted biopsies but the biopsy fragments appear superficial and a more severe deeper process cannot be ruled out.  B.  Histologic features in the rectosigmoid biopsies are not compatible with untreated inflammatory bowel disease.  Differential diagnosis can include infection, drug-effect and stercoral proctitis among other possibilities.    02/09/2021 Procedure   Colonoscopy, Dr. Alessandra Bevels  Impression: - Preparation of the colon was fair. - Likely malignant partially obstructing tumor in the distal sigmoid colon. Biopsied. Tattooed. - Congested, inflamed and thickened folds of the mucosa in the recto-sigmoid colon. Biopsied. Tattooed.   02/10/2021 Cancer Staging   Staging form: Colon and Rectum, AJCC 8th Edition - Pathologic stage from 02/10/2021: Stage IIB (pT4a, pN0, cM0) - Signed by Truitt Merle, MD on 02/28/2021 Stage prefix: Initial diagnosis Total positive nodes: 0 Histologic grading system: 4 grade system Histologic grade (G): G2 Residual tumor (R): R0 - None    02/10/2021 Definitive Surgery   FINAL MICROSCOPIC DIAGNOSIS:   A. COLON, SIGMOID:  - Invasive moderately differentiated adenocarcinoma.  - Twenty-seven lymph nodes, negative for metastatic carcinoma (0/27).  - See oncology table below.   B. COLON, DISTAL, SIGMOID:  - Segment of colon with serosal adhesions, acute inflammation, and tattoo pigment.  - Negative for malignancy.    02/11/2021 Initial Diagnosis   Cancer of sigmoid colon ([place])   03/14/2021 - 05/23/2021 Chemotherapy   Patient is on Treatment Plan : COLORECTAL Xelox ([place]ox) q21d     04/20/2021 Genetic Testing   Negative hereditary cancer genetic testing: no pathogenic variants detected in [facility] CustomNext-Cancer +RNAinsight Panel.  Report date is April 20, 2021.   The CustomNext-Cancer+RNAinsight panel offered by [facility] includes sequencing and rearrangement analysis for the following 47 genes:  APC, ATM, AXIN2, BARD1, BMPR1A, BRCA1, BRCA2, BRIP1, CDH1, CDK4, CDKN2A, CHEK2, DICER1, EPCAM, GREM1, HOXB13, MEN1, MLH1, MSH2, MSH3, MSH6, MUTYH, NBN, NF1, NF2, NTHL1, PALB2, PMS2, POLD1, POLE, PTEN, RAD51C, RAD51D, RECQL, RET, SDHA, SDHAF2, SDHB, SDHC, SDHD, SMAD4, SMARCA4, STK11, TP53, TSC1, TSC2, and VHL.  RNA data is routinely analyzed for use in variant interpretation for all genes.    02/20/2022 Imaging    IMPRESSION: 1. Similar postsurgical changes of partial sigmoidectomy with Hartmann's pouch formation and left anterior abdominal wall colostomy. No noncontrast CT evidence of local recurrence. New nonobstructive bowel/fluid containing peristomal hernia. 2. No convincing evidence of metastatic disease within the chest, abdomen, or pelvis on this noncontrast enhanced examination.  3. New multifocal subpleural foci of nodularity correspond with regions of atelectasis seen on prior imaging, favored to reflect subsegmental atelectasis. Stable 3 mm right lower lobe pulmonary nodule, favored benign. Consider attention on  dedicated short-term interval follow-up chest CT. 4. Stable soft tissue along the left iliac vessels again favored to reflect left ovary given its continuity with the gonadal vessels but difficult to evaluate given lack of intravenous contrast material. Correlation with CEA levels and continued attention on follow-up imaging suggested. 5. Moderate volume of formed stool throughout the colon. Correlate for constipation.      INTERVAL HISTORY:  Barbara Valdez was contacted for a follow up of sigmoid colon cancer  . She was last seen by me on 12/22/2021.  Pt states that had a hard time finding her vein.Pt states she was stuck x3. Pt reports of muscle cramps in her are when she tried to reach for something. Pt denies having swelling.   All other systems were reviewed with the patient and are negative.  MEDICAL HISTORY:  Past Medical History:  Diagnosis Date   Anemia    Anxiety    Colon cancer ([place]) 01/2021   stage 2   Complication of anesthesia    took a long time to wake up after surgery   Family history of breast cancer 03/30/2021   Family history of colon cancer 03/30/2021    SURGICAL HISTORY: Past Surgical History:  Procedure Laterality Date   ABDOMINAL HYSTERECTOMY Bilateral 09/26/2016   Procedure: HYSTERECTOMY ABDOMINAL W/ BILATERAL SALPINGECTOMY;  Surgeon: Emily Filbert, MD;  Location: [facility];  Service: Gynecology;  Laterality: Bilateral;   BIOPSY  02/09/2021   Procedure: BIOPSY;  Surgeon: Otis Brace, MD;  Location: [facility];  Service: Gastroenterology;;   BREAST BIOPSY Left    COLONOSCOPY N/A 02/09/2021   Procedure: COLONOSCOPY;  Surgeon: Otis Brace, MD;  Location: [facility];  Service: Gastroenterology;  Laterality: N/A;   LAPAROSCOPIC PARTIAL COLECTOMY N/A 02/10/2021   Procedure: ROBOTIC ASSISTED PARTIAL COLECTOMY WITH COLOSTOMY;  Surgeon: Johnathan Hausen, MD;  Location: [facility];  Service: General;  Laterality: N/A;   SUBMUCOSAL TATTOO INJECTION   02/09/2021   Procedure: SUBMUCOSAL TATTOO INJECTION;  Surgeon: Otis Brace, MD;  Location: [facility];  Service: Gastroenterology;;   TUBAL LIGATION      I have reviewed the social history and family history with the patient and they are unchanged from previous note.  ALLERGIES:  is allergic to ferrlecit [na ferric gluc cplx in sucrose].  MEDICATIONS:  Current Outpatient Medications  Medication Sig Dispense Refill   acetaminophen (TYLENOL) 500 MG tablet Take 2 tablets (1,000 mg total) by mouth every 6 (six) hours as needed for mild pain. (Patient not taking: Reported on 06/21/2021) 30 tablet 0   capecitabine (XELODA) 500 MG tablet TAKE 3 TABLETS BY MOUTH IN THE  MORNING AND 4 TABLETS BY MOUTH  IN THE EVENING EVERY 12 HOURS  FOR 14 DAYS THEN OFF FOR 7 DAYS  TAKE AFTER MEALS 98 tablet 0   methocarbamol (ROBAXIN) 750 MG tablet Take 1 tablet (750 mg total) by mouth every 8 (eight) hours as needed for muscle spasms. 30 tablet 0   Multiple Vitamin (MULTIVITAMIN WITH MINERALS) TABS tablet Take 1 tablet by mouth daily. 150 tablet 0   ondansetron (ZOFRAN) 8 MG tablet Take 1 tablet (8 mg total) by mouth 2 (two) times daily as needed for refractory nausea / vomiting. Start on day 3 after chemotherapy. 30 tablet 1   pantoprazole (PROTONIX) 40 MG  tablet Take 40 mg by mouth daily as needed. (Patient not taking: Reported on 06/21/2021)     polyethylene glycol (MIRALAX / GLYCOLAX) 17 g packet Take 17 g by mouth daily. 14 each 0   prochlorperazine (COMPAZINE) 10 MG tablet TAKE 1 TABLET BY MOUTH EVERY 6 HOURS AS NEEDED 30 tablet 1   No current facility-administered medications for this visit.    PHYSICAL EXAMINATION: ECOG PERFORMANCE STATUS: 0 - Asymptomatic  There were no vitals filed for this visit. Wt Readings from Last 3 Encounters:  12/22/21 157 lb 3.2 oz (71.3 kg)  09/15/21 145 lb (65.8 kg)  08/12/21 141 lb 14.4 oz (64.4 kg)     No vitals taken today, Exam not performed today  LABORATORY  DATA:  I have reviewed the data as listed    Latest Ref Rng & Units 02/16/2022   10:04 AM 12/22/2021   12:56 PM 09/15/2021   12:55 PM  CBC  WBC 4.0 - 10.5 K/uL 4.3  3.7  3.0   Hemoglobin 12.0 - 15.0 g/dL 11.9  12.2  11.7   Hematocrit 36.0 - 46.0 % 38.0  38.9  36.2   Platelets 150 - 400 K/uL 220  273  242         Latest Ref Rng & Units 02/16/2022   10:04 AM 12/22/2021   12:56 PM 09/15/2021   12:55 PM  CMP  Glucose 70 - 99 mg/dL 87  90  86   BUN 6 - 20 mg/dL '13  15  13   '$ Creatinine 0.44 - 1.00 mg/dL 0.91  0.79  0.72   Sodium 135 - 145 mmol/L 140  139  142   Potassium 3.5 - 5.1 mmol/L 4.0  4.4  4.0   Chloride 98 - 111 mmol/L 105  103  107   CO2 22 - 32 mmol/L 29  30  32   Calcium 8.9 - 10.3 mg/dL 10.0  10.7  9.9   Total Protein 6.5 - 8.1 g/dL 8.4  9.0  7.9   Total Bilirubin 0.3 - 1.2 mg/dL 0.5  0.5  0.4   Alkaline Phos 38 - 126 U/L 73  70  62   AST 15 - 41 U/L '20  22  21   '$ ALT 0 - 44 U/L '9  11  10       '$ RADIOGRAPHIC STUDIES: I have personally reviewed the radiological images as listed and agreed with the findings in the report. No results found.    Orders Placed This Encounter  Procedures   CT CHEST WO CONTRAST    Standing Status:   Future    Standing Expiration Date:   02/23/2023    Order Specific Question:   Is patient pregnant?    Answer:   No    Order Specific Question:   Preferred imaging location?    Answer:   [facility]    Order Specific Question:   Release to patient    Answer:   Immediate   [facility] (IN HOUSE-[facility])    Standing Status:   Standing    Number of Occurrences:   5    Standing Expiration Date:   02/23/2023   All questions were answered. The patient knows to call the clinic with any problems, questions or concerns. No barriers to learning was detected. The total time spent in the appointment was 22 minutes.     Truitt Merle, MD 02/22/2022   Felicity Coyer am acting as scribe for Truitt Merle, MD.  I have reviewed the above documentation  for accuracy and completeness, and I agree with the above.

## 2022-02-21 NOTE — Assessment & Plan Note (Addendum)
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 Hassell Done. 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 Margins and lymph nodes negative. -she received adjuvant CAPOX 03/14/21 - 05/23/21 to reduce her risk of recurrence. she had additional Xeloda alone to complete 6 months of treatment.  -surveillance colonoscopy on 06/21/21 with Dr. Lorenso Courier was unremarkable. Plan for repeat in 1 year. -restaging CT AP on 08/11/21 showed: a soft tissue nodule favored to be related to left ovary, but adenopathy not completely excluded. -She is clinically doing well, no concern for recurrence. -She had surveillance CT scan yesterday, IV contrast was not able to get them due to poor IV access.  It overall showed no definitive evidence of recurrence, stable soft tissue nodule in the left pelvis, felt to be left ovary.

## 2022-02-22 ENCOUNTER — Encounter: Payer: Self-pay | Admitting: Hematology

## 2022-02-22 ENCOUNTER — Inpatient Hospital Stay (HOSPITAL_BASED_OUTPATIENT_CLINIC_OR_DEPARTMENT_OTHER): Payer: PRIVATE HEALTH INSURANCE | Admitting: Hematology

## 2022-02-22 DIAGNOSIS — C187 Malignant neoplasm of sigmoid colon: Secondary | ICD-10-CM | POA: Diagnosis not present

## 2022-02-23 ENCOUNTER — Telehealth: Payer: Self-pay | Admitting: Hematology

## 2022-02-23 NOTE — Telephone Encounter (Signed)
Spoke with patient confirming upcoming appointments  

## 2022-05-17 ENCOUNTER — Other Ambulatory Visit: Payer: Self-pay

## 2022-05-17 DIAGNOSIS — C187 Malignant neoplasm of sigmoid colon: Secondary | ICD-10-CM

## 2022-05-18 ENCOUNTER — Ambulatory Visit (HOSPITAL_COMMUNITY): Payer: PRIVATE HEALTH INSURANCE

## 2022-05-18 ENCOUNTER — Other Ambulatory Visit: Payer: PRIVATE HEALTH INSURANCE

## 2022-05-24 ENCOUNTER — Ambulatory Visit: Payer: PRIVATE HEALTH INSURANCE | Admitting: Hematology

## 2022-06-09 ENCOUNTER — Ambulatory Visit (HOSPITAL_COMMUNITY)
Admission: RE | Admit: 2022-06-09 | Discharge: 2022-06-09 | Disposition: A | Discharge status: Home / Self Care | Payer: Medicaid Other | Source: Ambulatory Visit | Attending: Hematology

## 2022-06-09 DIAGNOSIS — C187 Malignant neoplasm of sigmoid colon: Secondary | ICD-10-CM | POA: Diagnosis present

## 2022-06-16 ENCOUNTER — Other Ambulatory Visit (HOSPITAL_COMMUNITY): Payer: Self-pay

## 2022-06-16 IMAGING — CT CT CHEST W/ CM
2 of 4 series · 15 of 36 positions shown, 18 images · IV contrast (agent unspecified)
Comparison: None.

CLINICAL DATA: Perforated colonic malignancy, initial staging
examination, CNS neoplasm

EXAM:
CT CHEST WITH CONTRAST
TECHNIQUE: Multidetector CT imaging of the chest was performed during
intravenous contrast administration.

[Series 2: axial st · axial · 0.68mm/px · z∈[-95,+133]mm · 12 of 134 slices shown, 15 images]
[im 10/134  mediastinal]
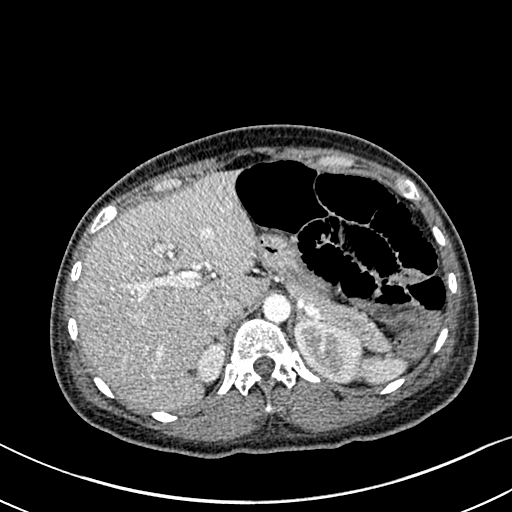
[im 10/134  lung]
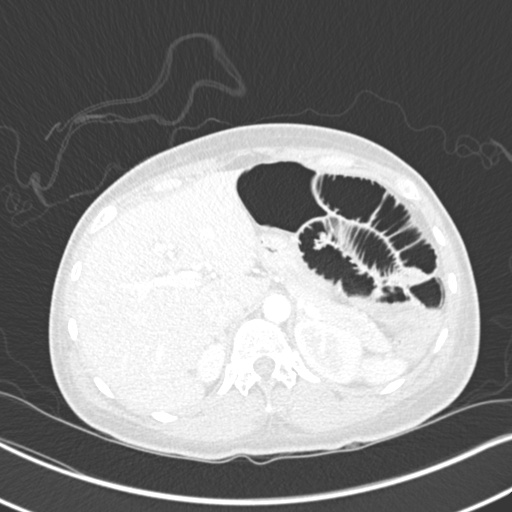
[im 20/134  lung]
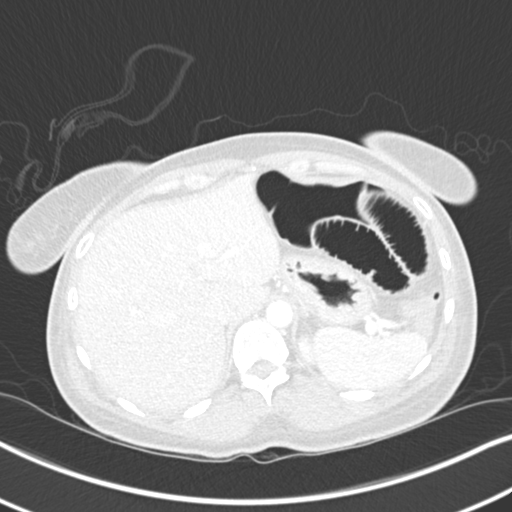
[im 29/134  lung]
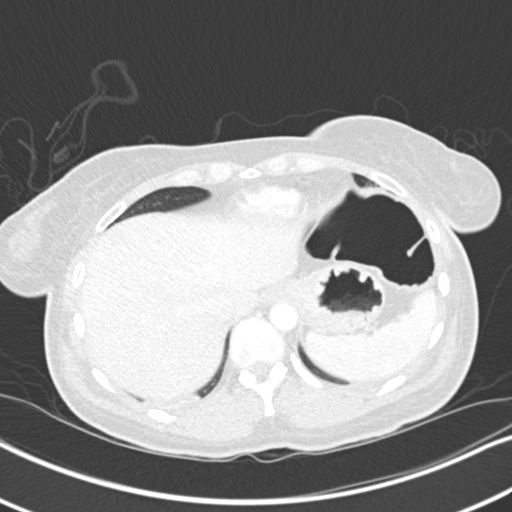
[im 39/134  lung]
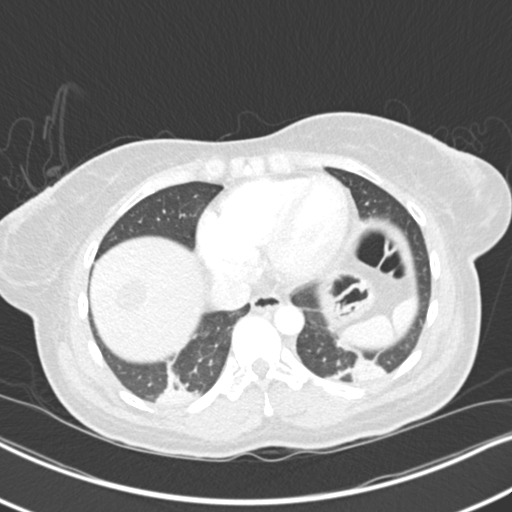
[im 48/134  mediastinal]
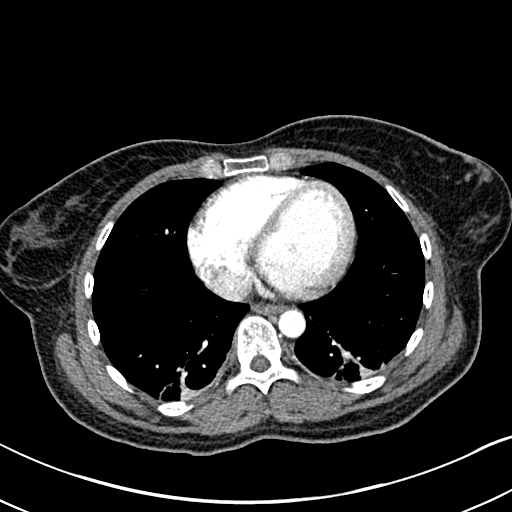
[im 48/134  lung]
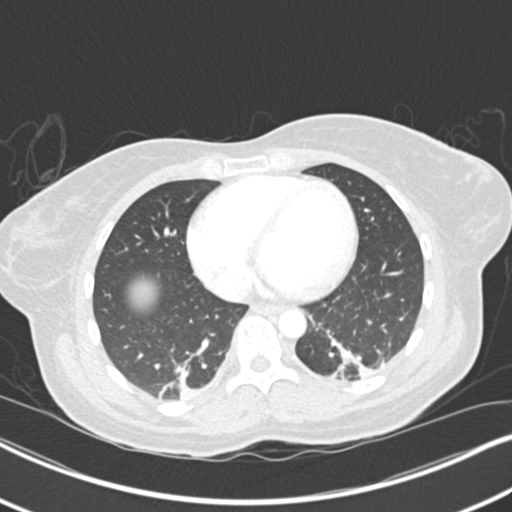
[im 58/134  lung]
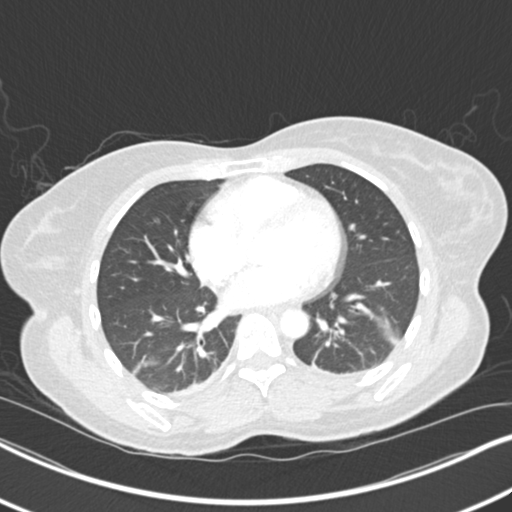
[im 77/134  lung]
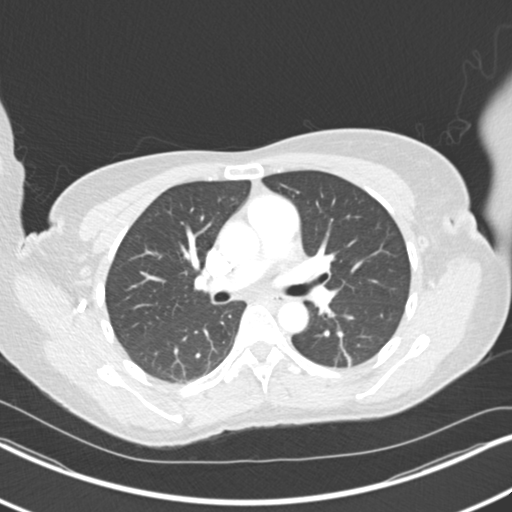
[im 86/134  lung]
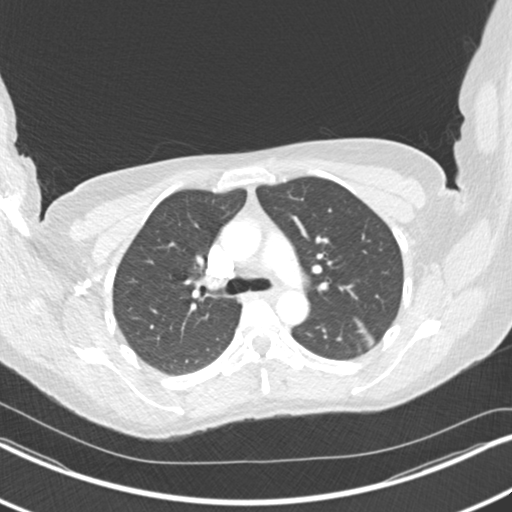
[im 96/134  mediastinal]
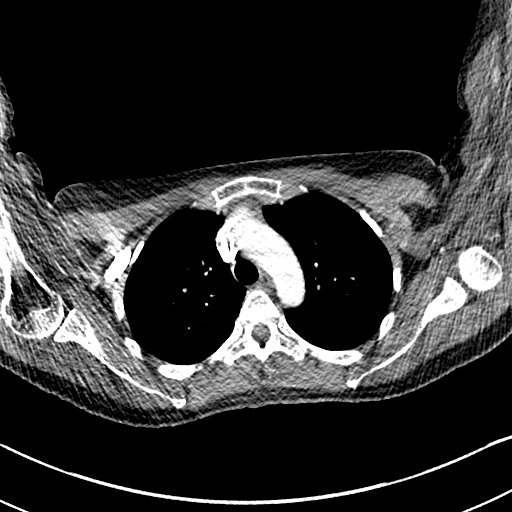
[im 96/134  lung]
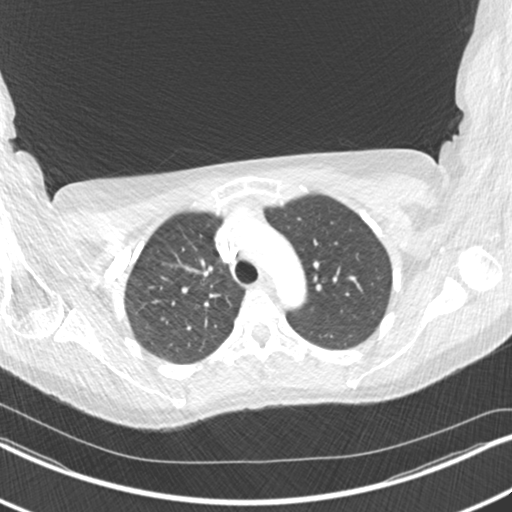
[im 105/134  lung]
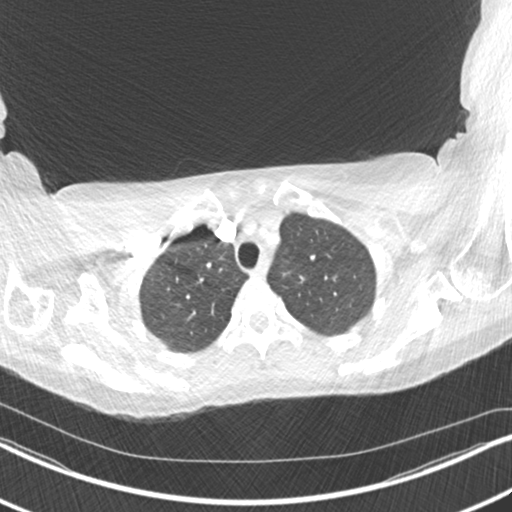
[im 115/134  lung]
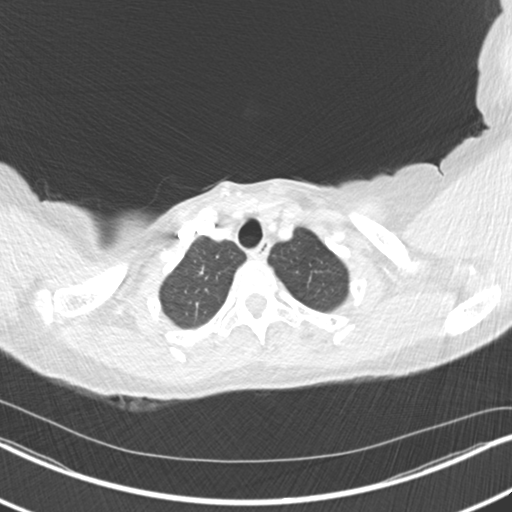
[im 124/134  lung]
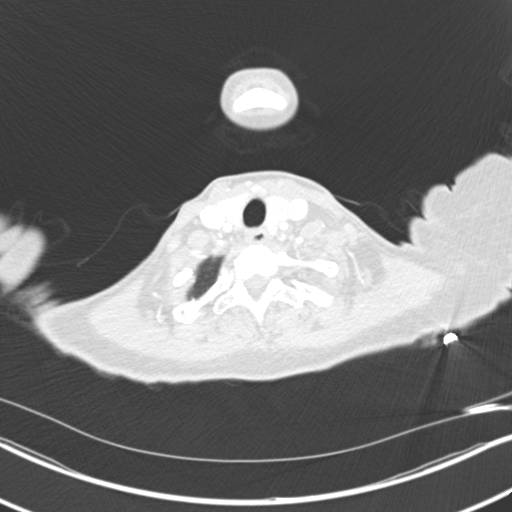

[Series 4: coronal · coronal · 0.54mm/px · 3 of 111 slices shown]
[im 23/111  lung]
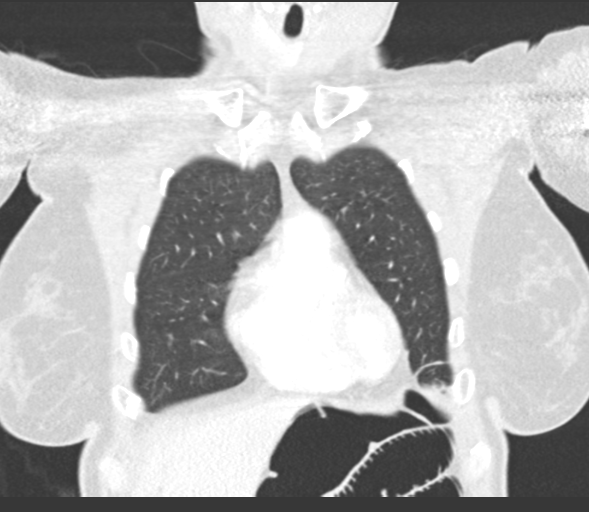
[im 45/111  lung]
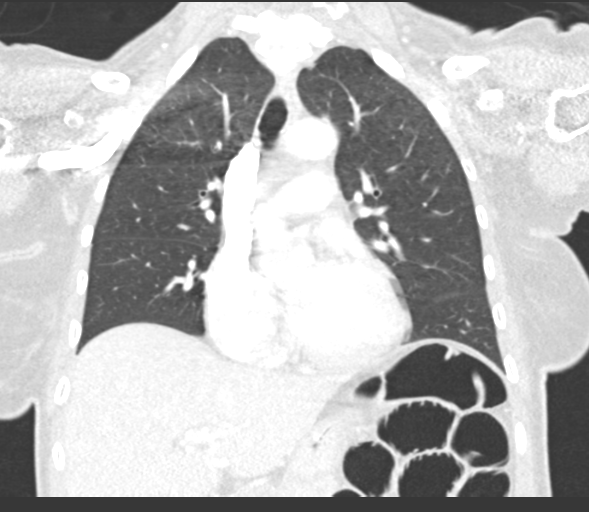
[im 67/111  lung]
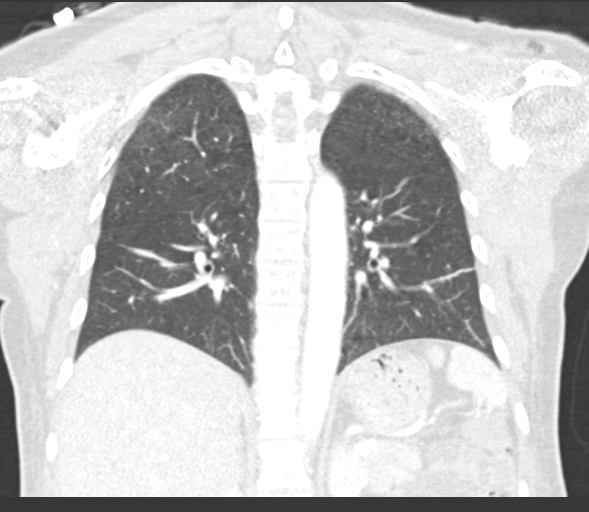

[15 of 36 positions shown; findings below may reference images not displayed]

RADIATION DOSE REDUCTION: This exam was performed according to the
departmental dose-optimization program which includes automated
exposure control, adjustment of the mA and/or kV according to
patient size and/or use of iterative reconstruction technique.

CONTRAST:  75mL OMNIPAQUE IOHEXOL 300 MG/ML  SOLN
FINDINGS: Cardiovascular: No significant vascular findings. Normal heart size.
No pericardial effusion.

Mediastinum/Nodes: No enlarged mediastinal, hilar, or axillary lymph
nodes. Thyroid gland, trachea, and esophagus demonstrate no
significant findings.

Lungs/Pleura: Bibasilar atelectasis. No focal pulmonary nodules or
infiltrates. No pneumothorax or pleural effusion. Central airways
are widely patent.

Upper Abdomen: Hypoattenuating lesion within the right hepatic dome
compatible with a benign cavernous hemangioma, better characterized
on prior CT examination of the abdomen pelvis of 02/07/2021.
Multiple mildly dilated gas-filled loops of small bowel are seen
within the left upper quadrant suggestive of a postoperative
adynamic ileus, not well assessed on this examination. The stomach
is not distended. No acute abnormality.

Musculoskeletal: No acute bone abnormality. No lytic or blastic bone
lesions are identified.
IMPRESSION: No evidence of intrathoracic metastatic disease.

Mildly dilated loops of small bowel within the upper abdomen
suggestive of a mild postoperative adynamic ileus.

## 2022-06-20 ENCOUNTER — Telehealth: Payer: Self-pay | Admitting: Hematology

## 2022-06-20 NOTE — Telephone Encounter (Signed)
Contacted patient to scheduled appointments. Left message with appointment details and a call back number if patient had any questions or could not accommodate the time we provided.   

## 2022-06-21 ENCOUNTER — Telehealth: Payer: Self-pay | Admitting: Hematology

## 2022-06-21 NOTE — Telephone Encounter (Signed)
Contacted patient to scheduled appointments. Patient is aware of appointments that are scheduled.   

## 2022-06-28 ENCOUNTER — Telehealth: Payer: Self-pay | Admitting: Hematology

## 2022-06-30 ENCOUNTER — Other Ambulatory Visit: Payer: PRIVATE HEALTH INSURANCE

## 2022-06-30 ENCOUNTER — Ambulatory Visit: Payer: PRIVATE HEALTH INSURANCE | Admitting: Hematology

## 2022-07-11 ENCOUNTER — Encounter: Payer: Self-pay | Admitting: Hematology

## 2022-07-11 ENCOUNTER — Inpatient Hospital Stay: Payer: Medicaid Other | Attending: Hematology

## 2022-07-11 ENCOUNTER — Inpatient Hospital Stay (HOSPITAL_BASED_OUTPATIENT_CLINIC_OR_DEPARTMENT_OTHER): Payer: Medicaid Other | Admitting: Hematology

## 2022-07-11 ENCOUNTER — Other Ambulatory Visit: Payer: Self-pay

## 2022-07-11 VITALS — BP 121/87 | HR 86 | Temp 98.8°F | Resp 18 | Ht 65.0 in | Wt 158.4 lb

## 2022-07-11 DIAGNOSIS — Z803 Family history of malignant neoplasm of breast: Secondary | ICD-10-CM | POA: Diagnosis not present

## 2022-07-11 DIAGNOSIS — C187 Malignant neoplasm of sigmoid colon: Secondary | ICD-10-CM

## 2022-07-11 DIAGNOSIS — R911 Solitary pulmonary nodule: Secondary | ICD-10-CM | POA: Diagnosis not present

## 2022-07-11 DIAGNOSIS — Z85038 Personal history of other malignant neoplasm of large intestine: Secondary | ICD-10-CM | POA: Insufficient documentation

## 2022-07-11 DIAGNOSIS — Z9049 Acquired absence of other specified parts of digestive tract: Secondary | ICD-10-CM | POA: Insufficient documentation

## 2022-07-11 LAB — CMP (CANCER CENTER ONLY)
ALT: 11 U/L (ref 0–44)
AST: 21 U/L (ref 15–41)
Albumin: 4 g/dL (ref 3.5–5.0)
Alkaline Phosphatase: 73 U/L (ref 38–126)
Anion gap: 7 (ref 5–15)
BUN: 9 mg/dL (ref 6–20)
CO2: 28 mmol/L (ref 22–32)
Calcium: 9.9 mg/dL (ref 8.9–10.3)
Chloride: 107 mmol/L (ref 98–111)
Creatinine: 0.81 mg/dL (ref 0.44–1.00)
GFR, Estimated: >60 mL/min (ref >60)
Glucose, Bld: 94 mg/dL (ref 70–99)
Potassium: 3.8 mmol/L (ref 3.5–5.1)
Sodium: 142 mmol/L (ref 135–145)
Total Bilirubin: 0.5 mg/dL (ref 0.3–1.2)
Total Protein: 7.7 g/dL (ref 6.5–8.1)

## 2022-07-11 LAB — CBC WITH DIFFERENTIAL (CANCER CENTER ONLY)
Abs Immature Granulocytes: 0.01 10*3/uL (ref 0.00–0.07)
Basophils Absolute: 0 10*3/uL (ref 0.0–0.1)
Basophils Relative: 1 %
Eosinophils Absolute: 0.2 10*3/uL (ref 0.0–0.5)
Eosinophils Relative: 4 %
HCT: 38.4 % (ref 36.0–46.0)
Hemoglobin: 11.7 g/dL — ABNORMAL LOW (ref 12.0–15.0)
Immature Granulocytes: 0 %
Lymphocytes Relative: 35 %
Lymphs Abs: 1.7 10*3/uL (ref 0.7–4.0)
MCH: 25.2 pg — ABNORMAL LOW (ref 26.0–34.0)
MCHC: 30.5 g/dL (ref 30.0–36.0)
MCV: 82.8 fL (ref 80.0–100.0)
Monocytes Absolute: 0.5 10*3/uL (ref 0.1–1.0)
Monocytes Relative: 9 %
Neutro Abs: 2.5 10*3/uL (ref 1.7–7.7)
Neutrophils Relative %: 51 %
Platelet Count: 262 10*3/uL (ref 150–400)
RBC: 4.64 MIL/uL (ref 3.87–5.11)
RDW: 14 % (ref 11.5–15.5)
WBC Count: 4.8 10*3/uL (ref 4.0–10.5)
nRBC: 0 % (ref 0.0–0.2)

## 2022-07-11 LAB — CEA (ACCESS): CEA (CHCC): 17.23 ng/mL — ABNORMAL HIGH (ref 0.00–5.00)

## 2022-07-11 NOTE — Assessment & Plan Note (Addendum)
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 [name]. 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 Margins and lymph nodes negative. -she received adjuvant CAPOX 03/14/21 - 05/23/21 to reduce her risk of recurrence. she had additional Xeloda alone to complete 6 months of treatment.  -surveillance colonoscopy on 06/21/21 with Dr. Leonides Schanz was unremarkable. Plan for repeat in 1 year. -restaging CT AP on 08/11/21 showed: a soft tissue nodule favored to be related to left ovary, but adenopathy not completely excluded. -She is clinically doing well, no concern for recurrence. -She had surveillance CT scan 02/21/2022 showed no definitive evidence of recurrence, stable soft tissue nodule in the left pelvis, felt to be left ovary.  CT scan also showed a few small subpleural nodule in her lungs, will follow-up closely. -Repeated CT chest from Jun 09, 2022 showed a stable tiny lung nodules, likely benign. -She is clinically doing well, lab reviewed, no clinical concern for recurrence.

## 2022-07-11 NOTE — Progress Notes (Signed)
[facility]   Telephone:([phone]) [phone] Fax:([phone]) [phone]   Clinic Follow up Note   Patient Care Team: Pcp, No as PCP - General Luretha Murphy, MD as Consulting Physician (General Surgery) Kathi Der, MD as Consulting Physician (Gastroenterology) Malachy Mood, MD as Consulting Physician (Hematology)  Date of Service:  07/11/2022  CHIEF COMPLAINT: f/u of  sigmoid colon cancer    CURRENT THERAPY:  Surveillance  ASSESSMENT:  Barbara Valdez is a 47 y.o. 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 [name]. 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 Margins and lymph nodes negative. -she received adjuvant CAPOX 03/14/21 - 05/23/21 to reduce her risk of recurrence. she had additional Xeloda alone to complete 6 months of treatment.  -surveillance colonoscopy on 06/21/21 with Dr. Leonides Schanz was unremarkable. Plan for repeat in 1 year. -restaging CT AP on 08/11/21 showed: a soft tissue nodule favored to be related to left ovary, but adenopathy not completely excluded. -She is clinically doing well, no concern for recurrence. -She had surveillance CT scan 02/21/2022 showed no definitive evidence of recurrence, stable soft tissue nodule in the left pelvis, felt to be left ovary.  CT scan also showed a few small subpleural nodule in her lungs, will follow-up closely. -Repeated CT chest from Jun 09, 2022 showed a stable tiny lung nodules, likely benign. -She is clinically doing well, lab reviewed, no clinical concern for recurrence.     PLAN: -lab reviewed -CEA -pending -I order CT scan in 3 months -lab and CT AP scan and f/u in 3 months   SUMMARY OF ONCOLOGIC HISTORY: Oncology History Overview Note   Cancer Staging  Cancer of sigmoid colon [facility]) Staging form: Colon and Rectum, AJCC 8th Edition - Pathologic stage from  02/10/2021: Stage IIB (pT4a, pN0, cM0) - Signed by Malachy Mood, MD on 02/28/2021    Cancer of sigmoid colon (HCC)  11/27/2020 Imaging   CLINICAL DATA:  Abdominal pain that is began Monday. Patient reports seen scant amounts of dark blood in stool. Feels bloated.   EXAM: CT ABDOMEN AND PELVIS WITH CONTRAST  IMPRESSION: 1. Proctocolitis involving the mid and distal sigmoid colon and upper rectum with a 3.9 cm gas containing interloop abscess centered in the sigmoid mesocolon. 2. Severe fecal burden.  No findings of bowel obstruction. 3. An enlarged left common iliac lymph node is likely reactive. 4. A 2.7 cm hemangioma is noted in the right hepatic lobe. 5. Mild focal bronchiolitis in the right lower lobe.   02/07/2021 Imaging   CLINICAL DATA:  Abdominal pain, cramping, constipation   EXAM: CT ABDOMEN AND PELVIS WITH CONTRAST  IMPRESSION: 1. There is severe, circumferential wall thickening and mucosal hyperenhancement involving the distal sigmoid colon and rectum to the anus, generally tethered appearance unchanged, involving at least 15-20 cm of distal sigmoid and rectum. 2. There is again seen an abscess at the medial aspect of the rectosigmoid junction measuring approximately 3.0 x 1.6 cm. 3. Findings are generally consistent with nonspecific infectious, inflammatory, or ischemic colitis, including inflammatory bowel disease such as Crohn's disease or ulcerative colitis; however an underlying malignancy is very difficult to exclude, particularly given substantial wall thickening. 4. Large burden of stool throughout the extremely redundant proximal colon, likely reflecting some degree of obstipation. 5. Enlarged left external iliac lymph nodes measuring up to 1.1 x 0.9 cm fall, nonspecific, possibly reactive, however nodal  metastatic disease is not excluded per above.   02/09/2021 Initial Biopsy   FINAL MICROSCOPIC DIAGNOSIS:   A. COLON MASS, SIGMOID, BIOPSY:  - Adenomatous lesion  with extensive high-grade dysplasia, see comment   B. RECTAL, COLON, SIGMOID, BIOPSY:  - Mild acute/ subacute nonspecific colitis, see comment  - Negative for definite features of chronicity, granulomas or dysplasia   COMMENT:  A.  Evidence of invasive carcinoma is not seen in the submitted biopsies but the biopsy fragments appear superficial and a more severe deeper process cannot be ruled out.  B.  Histologic features in the rectosigmoid biopsies are not compatible with untreated inflammatory bowel disease.  Differential diagnosis can include infection, drug-effect and stercoral proctitis among other possibilities.    02/09/2021 Procedure   Colonoscopy, Dr. Levora Angel  Impression: - Preparation of the colon was fair. - Likely malignant partially obstructing tumor in the distal sigmoid colon. Biopsied. Tattooed. - Congested, inflamed and thickened folds of the mucosa in the recto-sigmoid colon. Biopsied. Tattooed.   02/10/2021 Cancer Staging   Staging form: Colon and Rectum, AJCC 8th Edition - Pathologic stage from 02/10/2021: Stage IIB (pT4a, pN0, cM0) - Signed by Malachy Mood, MD on 02/28/2021 Stage prefix: Initial diagnosis Total positive nodes: 0 Histologic grading system: 4 grade system Histologic grade (G): G2 Residual tumor (R): R0 - None   02/10/2021 Definitive Surgery   FINAL MICROSCOPIC DIAGNOSIS:   A. COLON, SIGMOID:  - Invasive moderately differentiated adenocarcinoma.  - Twenty-seven lymph nodes, negative for metastatic carcinoma (0/27).  - See oncology table below.   B. COLON, DISTAL, SIGMOID:  - Segment of colon with serosal adhesions, acute inflammation, and tattoo pigment.  - Negative for malignancy.    02/11/2021 Initial Diagnosis   Cancer of sigmoid colon (HCC)   03/14/2021 - 05/23/2021 Chemotherapy   Patient is on Treatment Plan : COLORECTAL Xelox (Capeox) q21d     04/20/2021 Genetic Testing   Negative hereditary cancer genetic testing: no pathogenic variants  detected in [employer] CustomNext-Cancer +RNAinsight Panel.  Report date is April 20, 2021.   The CustomNext-Cancer+RNAinsight panel offered by [employer] includes sequencing and rearrangement analysis for the following 47 genes:  APC, ATM, AXIN2, BARD1, BMPR1A, BRCA1, BRCA2, BRIP1, CDH1, CDK4, CDKN2A, CHEK2, DICER1, EPCAM, GREM1, HOXB13, MEN1, MLH1, MSH2, MSH3, MSH6, MUTYH, NBN, NF1, NF2, NTHL1, PALB2, PMS2, POLD1, POLE, PTEN, RAD51C, RAD51D, RECQL, RET, SDHA, SDHAF2, SDHB, SDHC, SDHD, SMAD4, SMARCA4, STK11, TP53, TSC1, TSC2, and VHL.  RNA data is routinely analyzed for use in variant interpretation for all genes.    02/20/2022 Imaging    IMPRESSION: 1. Similar postsurgical changes of partial sigmoidectomy with Hartmann's pouch formation and left anterior abdominal wall colostomy. No noncontrast CT evidence of local recurrence. New nonobstructive bowel/fluid containing peristomal hernia. 2. No convincing evidence of metastatic disease within the chest, abdomen, or pelvis on this noncontrast enhanced examination. 3. New multifocal subpleural foci of nodularity correspond with regions of atelectasis seen on prior imaging, favored to reflect subsegmental atelectasis. Stable 3 mm right lower lobe pulmonary nodule, favored benign. Consider attention on dedicated short-term interval follow-up chest CT. 4. Stable soft tissue along the left iliac vessels again favored to reflect left ovary given its continuity with the gonadal vessels but difficult to evaluate given lack of intravenous contrast material. Correlation with CEA levels and continued attention on follow-up imaging suggested. 5. Moderate volume of formed stool throughout the colon. Correlate for constipation.      INTERVAL HISTORY:  Barbara Valdez is here for a  follow up of  sigmoid colon cancer . She was last seen by me on 02/22/2022. She presents to the clinic alone. Pt state that she has no issues or concerns. She reports that her  BM is regular , sometimes constipation, but it depends on what she eats.She is clinically doing well.     All other systems were reviewed with the patient and are negative.  MEDICAL HISTORY:  Past Medical History:  Diagnosis Date   Anemia    Anxiety    Colon cancer (HCC) 01/2021   stage 2   Complication of anesthesia    took a long time to wake up after surgery   Family history of breast cancer 03/30/2021   Family history of colon cancer 03/30/2021    SURGICAL HISTORY: Past Surgical History:  Procedure Laterality Date   ABDOMINAL HYSTERECTOMY Bilateral 09/26/2016   Procedure: HYSTERECTOMY ABDOMINAL W/ BILATERAL SALPINGECTOMY;  Surgeon: Allie Bossier, MD;  Location: [facility];  Service: Gynecology;  Laterality: Bilateral;   BIOPSY  02/09/2021   Procedure: BIOPSY;  Surgeon: Kathi Der, MD;  Location: [facility];  Service: Gastroenterology;;   BREAST BIOPSY Left    COLONOSCOPY N/A 02/09/2021   Procedure: COLONOSCOPY;  Surgeon: Kathi Der, MD;  Location: [facility];  Service: Gastroenterology;  Laterality: N/A;   LAPAROSCOPIC PARTIAL COLECTOMY N/A 02/10/2021   Procedure: ROBOTIC ASSISTED PARTIAL COLECTOMY WITH COLOSTOMY;  Surgeon: Luretha Murphy, MD;  Location: [facility];  Service: General;  Laterality: N/A;   SUBMUCOSAL TATTOO INJECTION  02/09/2021   Procedure: SUBMUCOSAL TATTOO INJECTION;  Surgeon: Kathi Der, MD;  Location: [facility];  Service: Gastroenterology;;   TUBAL LIGATION      I have reviewed the social history and family history with the patient and they are unchanged from previous note.  ALLERGIES:  is allergic to ferrlecit [na ferric gluc cplx in sucrose].  MEDICATIONS:  Current Outpatient Medications  Medication Sig Dispense Refill   acetaminophen (TYLENOL) 500 MG tablet Take 2 tablets (1,000 mg total) by mouth every 6 (six) hours as needed for mild pain. (Patient not taking: Reported on 06/21/2021) 30 tablet 0   capecitabine (XELODA) 500 MG  tablet TAKE 3 TABLETS BY MOUTH IN THE  MORNING AND 4 TABLETS BY MOUTH  IN THE EVENING EVERY 12 HOURS  FOR 14 DAYS THEN OFF FOR 7 DAYS  TAKE AFTER MEALS 98 tablet 0   methocarbamol (ROBAXIN) 750 MG tablet Take 1 tablet (750 mg total) by mouth every 8 (eight) hours as needed for muscle spasms. 30 tablet 0   Multiple Vitamin (MULTIVITAMIN WITH MINERALS) TABS tablet Take 1 tablet by mouth daily. 150 tablet 0   ondansetron (ZOFRAN) 8 MG tablet Take 1 tablet (8 mg total) by mouth 2 (two) times daily as needed for refractory nausea / vomiting. Start on day 3 after chemotherapy. 30 tablet 1   pantoprazole (PROTONIX) 40 MG tablet Take 40 mg by mouth daily as needed. (Patient not taking: Reported on 06/21/2021)     polyethylene glycol (MIRALAX / GLYCOLAX) 17 g packet Take 17 g by mouth daily. 14 each 0   prochlorperazine (COMPAZINE) 10 MG tablet TAKE 1 TABLET BY MOUTH EVERY 6 HOURS AS NEEDED 30 tablet 1   No current facility-administered medications for this visit.    PHYSICAL EXAMINATION: ECOG PERFORMANCE STATUS: 0 - Asymptomatic  Vitals:   07/11/22 0846  BP: 121/87  Pulse: 86  Resp: 18  Temp: 98.8 F (37.1 C)  SpO2: 98%   Wt Readings from Last  3 Encounters:  07/11/22 158 lb 6.4 oz (71.8 kg)  12/22/21 157 lb 3.2 oz (71.3 kg)  09/15/21 145 lb (65.8 kg)     GENERAL:alert, no distress and comfortable SKIN: skin color normal, no rashes or significant lesions EYES: normal, Conjunctiva are pink and non-injected, sclera clear  NEURO: alert & oriented x 3 with fluent speech NECK:(-) supple, thyroid normal size, non-tender, without nodularity LYMPH:(-)  no palpable lymphadenopathy in the cervical, axillary  ABDOMEN:(-)abdomen soft, (-) non-tender and (-) normal bowel sounds LABORATORY DATA:  I have reviewed the data as listed    Latest Ref Rng & Units 07/11/2022    8:17 AM 02/16/2022   10:04 AM 12/22/2021   12:56 PM  CBC  WBC 4.0 - 10.5 K/uL 4.8  4.3  3.7   Hemoglobin 12.0 - 15.0 g/dL 16.1   09.6  04.5   Hematocrit 36.0 - 46.0 % 38.4  38.0  38.9   Platelets 150 - 400 K/uL 262  220  273         Latest Ref Rng & Units 07/11/2022    8:17 AM 02/16/2022   10:04 AM 12/22/2021   12:56 PM  CMP  Glucose 70 - 99 mg/dL 94  87  90   BUN 6 - 20 mg/dL 9  13  15    Creatinine 0.44 - 1.00 mg/dL 4.09  8.11  9.14   Sodium 135 - 145 mmol/L 142  140  139   Potassium 3.5 - 5.1 mmol/L 3.8  4.0  4.4   Chloride 98 - 111 mmol/L 107  105  103   CO2 22 - 32 mmol/L 28  29  30    Calcium 8.9 - 10.3 mg/dL 9.9  78.2  95.6   Total Protein 6.5 - 8.1 g/dL 7.7  8.4  9.0   Total Bilirubin 0.3 - 1.2 mg/dL 0.5  0.5  0.5   Alkaline Phos 38 - 126 U/L 73  73  70   AST 15 - 41 U/L 21  20  22    ALT 0 - 44 U/L 11  9  11        RADIOGRAPHIC STUDIES: I have personally reviewed the radiological images as listed and agreed with the findings in the report. No results found.    Orders Placed This Encounter  Procedures   CT Abdomen Pelvis W Contrast    Standing Status:   Future    Standing Expiration Date:   07/11/2023    Order Specific Question:   If indicated for the ordered procedure, I authorize the administration of contrast media per Radiology protocol    Answer:   Yes    Order Specific Question:   Does the patient have a contrast media/X-ray dye allergy?    Answer:   No    Order Specific Question:   Is patient pregnant?    Answer:   No    Order Specific Question:   Preferred imaging location?    Answer:   [facility]    Order Specific Question:   If indicated for the ordered procedure, I authorize the administration of oral contrast media per Radiology protocol    Answer:   Yes   All questions were answered. The patient knows to call the clinic with any problems, questions or concerns. No barriers to learning was detected. The total time spent in the appointment was 25 minutes.     Malachy Mood, MD 07/11/2022   Carolin Coy, CMA, am acting as scribe  for Malachy Mood, MD.   I have  reviewed the above documentation for accuracy and completeness, and I agree with the above.

## 2022-07-24 ENCOUNTER — Other Ambulatory Visit: Payer: Self-pay

## 2022-07-24 ENCOUNTER — Telehealth: Payer: Self-pay

## 2022-07-24 DIAGNOSIS — C187 Malignant neoplasm of sigmoid colon: Secondary | ICD-10-CM

## 2022-07-24 NOTE — Telephone Encounter (Addendum)
Called patient and made her aware of message below. Scheduled her lab appointment for 830 am on 07/02. She voiced full understanding of message.   ----- Message from Malachy Mood, MD sent at 07/23/2022  5:08 PM EDT ----- Please let pt know her CEA was elevated on last visit, and I recommend repeat CEA (please order, lab may not draw standing order) this week, if it's till high, will order PET scan, her last CT in May 2024 was negative, thanks   Malachy Mood  07/23/2022

## 2022-07-25 ENCOUNTER — Other Ambulatory Visit: Payer: Self-pay

## 2022-07-25 ENCOUNTER — Inpatient Hospital Stay: Payer: Medicaid Other | Attending: Hematology

## 2022-07-25 DIAGNOSIS — C187 Malignant neoplasm of sigmoid colon: Secondary | ICD-10-CM

## 2022-07-25 DIAGNOSIS — Z85038 Personal history of other malignant neoplasm of large intestine: Secondary | ICD-10-CM | POA: Insufficient documentation

## 2022-07-25 LAB — CBC WITH DIFFERENTIAL (CANCER CENTER ONLY)
Abs Immature Granulocytes: 0.01 10*3/uL (ref 0.00–0.07)
Basophils Absolute: 0 10*3/uL (ref 0.0–0.1)
Basophils Relative: 1 %
Eosinophils Absolute: 0.2 10*3/uL (ref 0.0–0.5)
Eosinophils Relative: 5 %
HCT: 37.8 % (ref 36.0–46.0)
Hemoglobin: 11.6 g/dL — ABNORMAL LOW (ref 12.0–15.0)
Immature Granulocytes: 0 %
Lymphocytes Relative: 38 %
Lymphs Abs: 1.8 10*3/uL (ref 0.7–4.0)
MCH: 25.3 pg — ABNORMAL LOW (ref 26.0–34.0)
MCHC: 30.7 g/dL (ref 30.0–36.0)
MCV: 82.5 fL (ref 80.0–100.0)
Monocytes Absolute: 0.5 10*3/uL (ref 0.1–1.0)
Monocytes Relative: 10 %
Neutro Abs: 2.2 10*3/uL (ref 1.7–7.7)
Neutrophils Relative %: 46 %
Platelet Count: 246 10*3/uL (ref 150–400)
RBC: 4.58 MIL/uL (ref 3.87–5.11)
RDW: 14.1 % (ref 11.5–15.5)
WBC Count: 4.7 10*3/uL (ref 4.0–10.5)
nRBC: 0 % (ref 0.0–0.2)

## 2022-07-25 LAB — CMP (CANCER CENTER ONLY)
ALT: 7 U/L (ref 0–44)
AST: 19 U/L (ref 15–41)
Albumin: 4 g/dL (ref 3.5–5.0)
Alkaline Phosphatase: 66 U/L (ref 38–126)
Anion gap: 7 (ref 5–15)
BUN: 9 mg/dL (ref 6–20)
CO2: 28 mmol/L (ref 22–32)
Calcium: 9.2 mg/dL (ref 8.9–10.3)
Chloride: 108 mmol/L (ref 98–111)
Creatinine: 0.84 mg/dL (ref 0.44–1.00)
GFR, Estimated: >60 mL/min (ref >60)
Glucose, Bld: 86 mg/dL (ref 70–99)
Potassium: 4 mmol/L (ref 3.5–5.1)
Sodium: 143 mmol/L (ref 135–145)
Total Bilirubin: 0.4 mg/dL (ref 0.3–1.2)
Total Protein: 7.6 g/dL (ref 6.5–8.1)

## 2022-07-25 LAB — CEA (ACCESS): CEA (CHCC): 18.22 ng/mL — ABNORMAL HIGH (ref 0.00–5.00)

## 2022-07-26 ENCOUNTER — Ambulatory Visit (INDEPENDENT_AMBULATORY_CARE_PROVIDER_SITE_OTHER): Payer: Medicaid Other | Admitting: Family Medicine

## 2022-07-26 ENCOUNTER — Encounter (HOSPITAL_BASED_OUTPATIENT_CLINIC_OR_DEPARTMENT_OTHER): Payer: Self-pay | Admitting: Family Medicine

## 2022-07-26 VITALS — BP 115/82 | HR 67 | Ht 65.0 in | Wt 158.0 lb

## 2022-07-26 DIAGNOSIS — Z23 Encounter for immunization: Secondary | ICD-10-CM | POA: Diagnosis not present

## 2022-07-26 DIAGNOSIS — R0683 Snoring: Secondary | ICD-10-CM

## 2022-07-26 DIAGNOSIS — Z7689 Persons encountering health services in other specified circumstances: Secondary | ICD-10-CM | POA: Diagnosis not present

## 2022-07-26 DIAGNOSIS — Z1231 Encounter for screening mammogram for malignant neoplasm of breast: Secondary | ICD-10-CM

## 2022-07-26 NOTE — Patient Instructions (Signed)
Please schedule your colonoscopy :)

## 2022-07-26 NOTE — Progress Notes (Signed)
New Patient Office Visit  Subjective:   Barbara Valdez November 24, 1975 07/26/2022  Chief Complaint  Patient presents with   New Patient (Initial Visit)    Pt is here to establish with practice. Denies any concerns for today's visit.    HPI: Barbara Valdez presents today to establish care at [facility] at [facility]. Introduced to Publishing rights manager role and practice setting.  All questions answered.   Last PCP: OBGYN (Dr. [name])  Concerns: See below    SLEEP APNEA CONCERN: Barbara Valdez presents for concern of possible OSA. Pt states she snores excessively at night and will wake up "choking" from her snoring. She denies daytime hypersomnolence or headaches.  CPAP use:  no Good sleep hygiene:   Patient reports insomnia at night due to fatigue during the day. She gets about 6 hours of sleep.  Snoring:  yes  CSCOPE Maintenance:  Patient states she is followed by Oncology for colon cancer hx . She will schedule cscope on her own.    The following portions of the patient's history were reviewed and updated as appropriate: past medical history, past surgical history, family history, social history, allergies, medications, and problem list.   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   Iron deficiency anemia 07/18/2016   Past Medical History:  Diagnosis Date   Anemia    Anxiety    Colon cancer (HCC) 01/2021   stage 2   Complication of anesthesia    took a long time to wake up after surgery   Family history of breast cancer  03/30/2021   Family history of colon cancer 03/30/2021   Past Surgical History:  Procedure Laterality Date   ABDOMINAL HYSTERECTOMY Bilateral 09/26/2016   Procedure: HYSTERECTOMY ABDOMINAL W/ BILATERAL SALPINGECTOMY;  Surgeon: Allie Bossier, MD;  Location: [facility];  Service: Gynecology;  Laterality: Bilateral;   BIOPSY  02/09/2021   Procedure: BIOPSY;  Surgeon: Kathi Der, MD;  Location: [facility];  Service: Gastroenterology;;   BREAST BIOPSY Left    COLONOSCOPY N/A 02/09/2021   Procedure: COLONOSCOPY;  Surgeon: Kathi Der, MD;  Location: [facility];  Service: Gastroenterology;  Laterality: N/A;   LAPAROSCOPIC PARTIAL COLECTOMY N/A 02/10/2021   Procedure: ROBOTIC ASSISTED PARTIAL COLECTOMY WITH COLOSTOMY;  Surgeon: Luretha Murphy, MD;  Location: [facility];  Service: General;  Laterality: N/A;   SUBMUCOSAL TATTOO INJECTION  02/09/2021   Procedure: SUBMUCOSAL TATTOO INJECTION;  Surgeon: Kathi Der, MD;  Location: [facility];  Service: Gastroenterology;;   TUBAL LIGATION     Family History  Problem Relation Age of Onset   Hyperlipidemia Father    Breast cancer Maternal Aunt    Lymphoma Maternal Aunt    Colon cancer Maternal Uncle 62   Lung cancer Maternal Uncle    Lymphoma Paternal Grandmother        d. 66   Breast cancer Cousin 57       maternal female cousin   Hypertension Other    Cancer Other        PGF's mother; d. early 74s; unknown type;  mets   Social History   Socioeconomic History   Marital status: Single    Spouse name: Not on file   Number of children: 2   Years of education: Not on file   Highest education level: Not on file  Occupational History   Not on file  Tobacco Use   Smoking status: Never   Smokeless tobacco: Never  Vaping Use   Vaping Use: Never used  Substance and Sexual Activity   Alcohol use: Never   Drug use: No   Sexual activity: Not Currently    Partners: Male    Birth control/protection: Surgical  Other Topics Concern    Not on file  Social History Narrative   Not on file   Social Determinants of Health   Financial Resource Strain: Not on file  Food Insecurity: Not on file  Transportation Needs: Not on file  Physical Activity: Not on file  Stress: Not on file  Social Connections: Not on file  Intimate Partner Violence: Not on file   Outpatient Medications Prior to Visit  Medication Sig Dispense Refill   acetaminophen (TYLENOL) 500 MG tablet Take 2 tablets (1,000 mg total) by mouth every 6 (six) hours as needed for mild pain. 30 tablet 0   ondansetron (ZOFRAN) 8 MG tablet Take 1 tablet (8 mg total) by mouth 2 (two) times daily as needed for refractory nausea / vomiting. Start on day 3 after chemotherapy. 30 tablet 1   pantoprazole (PROTONIX) 40 MG tablet Take 40 mg by mouth daily as needed.     polyethylene glycol (MIRALAX / GLYCOLAX) 17 g packet Take 17 g by mouth daily. 14 each 0   methocarbamol (ROBAXIN) 750 MG tablet Take 1 tablet (750 mg total) by mouth every 8 (eight) hours as needed for muscle spasms. (Patient not taking: Reported on 07/26/2022) 30 tablet 0   capecitabine (XELODA) 500 MG tablet TAKE 3 TABLETS BY MOUTH IN THE  MORNING AND 4 TABLETS BY MOUTH  IN THE EVENING EVERY 12 HOURS  FOR 14 DAYS THEN OFF FOR 7 DAYS  TAKE AFTER MEALS 98 tablet 0   Multiple Vitamin (MULTIVITAMIN WITH MINERALS) TABS tablet Take 1 tablet by mouth daily. 150 tablet 0   prochlorperazine (COMPAZINE) 10 MG tablet TAKE 1 TABLET BY MOUTH EVERY 6 HOURS AS NEEDED 30 tablet 1   No facility-administered medications prior to visit.   Allergies  Allergen Reactions   Ferrlecit [Na Ferric Gluc Cplx In Sucrose] Nausea And Vomiting    ROS: A complete ROS was performed with pertinent positives/negatives noted in the HPI. The remainder of the ROS are negative.   Objective:   Today's Vitals   07/26/22 0818  BP: 115/82  Pulse: 67  SpO2: 100%  Weight: 158 lb (71.7 kg)  Height: 5\' 5"  (1.651 m)    GENERAL: Well-appearing,  in NAD. Well nourished.  SKIN: Pink, warm and dry. No rash, lesion, ulceration, or ecchymoses.  Head: Normocephalic. NECK: Trachea midline. Full ROM w/o pain or tenderness. No lymphadenopathy.  RESPIRATORY: Chest wall symmetrical. Respirations even and non-labored. Breath sounds clear to auscultation bilaterally.  CARDIAC: S1, S2 present, regular rate and rhythm without murmur or gallops. Peripheral pulses 2+ bilaterally.  MSK: Muscle tone and strength appropriate for age. Joints w/o tenderness, redness, or swelling.  EXTREMITIES: Without clubbing, cyanosis, or edema.  NEUROLOGIC: No motor or sensory deficits. Steady, even gait. C2-C12 intact.  PSYCH/MENTAL STATUS: Alert, oriented x 3. Cooperative, appropriate mood and affect.    Health Maintenance  Due  Topic Date Due   Hepatitis C Screening  Never done   COVID-19 Vaccine (2 - Pfizer risk series) 05/22/2019   Colonoscopy  06/22/2022    No results found for any visits on 07/26/22.     Assessment & Plan:  1. Encounter to establish care Will obtain fasting labwork prior to her physical exam. She would like screening for BV, Yeast, with vaginal swab at next visit.   - Lipid panel; Future - Comprehensive metabolic panel; Future - CBC with Differential/Platelet; Future - Hemoglobin A1c; Future - TSH; Future - Hepatitis C Antibody; Future  2. Encounter for screening mammogram for malignant neoplasm of breast Mammogram ordered to be done at [facility]  - MM 3D SCREENING MAMMOGRAM BILATERAL BREAST; Future  3. Immunization due Tdap given in office today.  - Tdap vaccine greater than or equal to 7yo IM  4. Snoring Referral placed to [facility] for OSA evalution.   - Ambulatory referral to Neurology  Patient to reach out to office if new, worrisome, or unresolved symptoms arise or if no improvement in patient's condition. Patient verbalized understanding and is agreeable to treatment plan. All questions answered to  patient's satisfaction.   Return in about 3 weeks (around 08/16/2022) for ANNUAL PHYSICAL (Fasting labs prior) .   Yolanda Manges, FNP

## 2022-07-27 ENCOUNTER — Other Ambulatory Visit: Payer: Self-pay | Admitting: Hematology

## 2022-07-27 DIAGNOSIS — C187 Malignant neoplasm of sigmoid colon: Secondary | ICD-10-CM

## 2022-07-31 ENCOUNTER — Other Ambulatory Visit: Payer: Self-pay

## 2022-08-02 ENCOUNTER — Telehealth: Payer: Self-pay | Admitting: Hematology

## 2022-08-09 ENCOUNTER — Other Ambulatory Visit (HOSPITAL_BASED_OUTPATIENT_CLINIC_OR_DEPARTMENT_OTHER): Payer: Self-pay | Admitting: Family Medicine

## 2022-08-09 ENCOUNTER — Other Ambulatory Visit (HOSPITAL_BASED_OUTPATIENT_CLINIC_OR_DEPARTMENT_OTHER): Payer: Medicaid Other

## 2022-08-10 LAB — CBC WITH DIFFERENTIAL/PLATELET
Basophils Absolute: 0 10*3/uL (ref 0.0–0.2)
Basos: 1 %
EOS (ABSOLUTE): 0.2 10*3/uL (ref 0.0–0.4)
Eos: 4 %
Hematocrit: 35.7 % (ref 34.0–46.6)
Hemoglobin: 11.3 g/dL (ref 11.1–15.9)
Immature Grans (Abs): 0 10*3/uL (ref 0.0–0.1)
Immature Granulocytes: 0 %
Lymphocytes Absolute: 1.7 10*3/uL (ref 0.7–3.1)
Lymphs: 37 %
MCH: 24.9 pg — ABNORMAL LOW (ref 26.6–33.0)
MCHC: 31.7 g/dL (ref 31.5–35.7)
MCV: 79 fL (ref 79–97)
Monocytes Absolute: 0.4 10*3/uL (ref 0.1–0.9)
Monocytes: 10 %
Neutrophils Absolute: 2.2 10*3/uL (ref 1.4–7.0)
Neutrophils: 48 %
Platelets: 250 10*3/uL (ref 150–450)
RBC: 4.53 x10E6/uL (ref 3.77–5.28)
RDW: 13.3 % (ref 11.7–15.4)
WBC: 4.5 10*3/uL (ref 3.4–10.8)

## 2022-08-10 LAB — HEPATITIS C ANTIBODY: Hep C Virus Ab: NONREACTIVE

## 2022-08-10 LAB — HEMOGLOBIN A1C
Est. average glucose Bld gHb Est-mCnc: 140 mg/dL
Hgb A1c MFr Bld: 6.5 % — ABNORMAL HIGH (ref 4.8–5.6)

## 2022-08-10 LAB — COMPREHENSIVE METABOLIC PANEL
ALT: 9 IU/L (ref 0–32)
AST: 22 IU/L (ref 0–40)
Albumin: 4.5 g/dL (ref 3.9–4.9)
Alkaline Phosphatase: 75 IU/L (ref 44–121)
BUN/Creatinine Ratio: 16 (ref 9–23)
BUN: 14 mg/dL (ref 6–24)
Bilirubin Total: 0.3 mg/dL (ref 0.0–1.2)
CO2: 24 mmol/L (ref 20–29)
Calcium: 10 mg/dL (ref 8.7–10.2)
Chloride: 104 mmol/L (ref 96–106)
Creatinine, Ser: 0.85 mg/dL (ref 0.57–1.00)
Globulin, Total: 3.3 g/dL (ref 1.5–4.5)
Glucose: 87 mg/dL (ref 70–99)
Potassium: 4.3 mmol/L (ref 3.5–5.2)
Sodium: 144 mmol/L (ref 134–144)
Total Protein: 7.8 g/dL (ref 6.0–8.5)
eGFR: 86 mL/min/{1.73_m2} (ref >59)

## 2022-08-10 LAB — LIPID PANEL
Chol/HDL Ratio: 3.5 ratio (ref 0.0–4.4)
Cholesterol, Total: 235 mg/dL — ABNORMAL HIGH (ref 100–199)
HDL: 67 mg/dL (ref >39)
LDL Chol Calc (NIH): 155 mg/dL — ABNORMAL HIGH (ref 0–99)
Triglycerides: 74 mg/dL (ref 0–149)
VLDL Cholesterol Cal: 13 mg/dL (ref 5–40)

## 2022-08-10 LAB — TSH: TSH: 1.2 u[IU]/mL (ref 0.450–4.500)

## 2022-08-15 NOTE — Progress Notes (Unsigned)
Subjective:   Barbara Valdez 03-03-1975  08/16/2022   CC: No chief complaint on file.   HPI: Barbara Valdez is a 47 y.o. female who presents for a routine health maintenance exam.  Labs collected at time of visit.    HEALTH SCREENINGS: - Vision Screening: {Blank single:19197::"pap done","not applicable","up to date","done elsewhere"} - Dental Visits: {Blank single:19197::"pap done","not applicable","up to date","done elsewhere"} - Pap smear: {Blank single:19197::"pap done","not applicable","up to date","done elsewhere"} - Breast Exam: {Blank single:19197::"pap done","not applicable","up to date","done elsewhere"} - STD Screening: {Blank single:19197::"Up to date","Ordered today","Not applicable","Declined","Done elsewhere"} - Mammogram (40+): {Blank single:19197::"Up to date","Ordered today","Not applicable","Refused","Done elsewhere"}  - Colonoscopy (45+): {Blank single:19197::"Up to date","Ordered today","Not applicable","Refused","Done elsewhere"}  - Bone Density (65+ or under 65 with predisposing conditions): {Blank single:19197::"Up to date","Ordered today","Not applicable","Refused","Done elsewhere"}  - Lung CA screening with low-dose CT:  {Blank single:19197::"Up to date","Ordered today","Not applicable","Declined","Done elsewhere"} Adults age 77-80 who are current cigarette smokers or quit within the last 15 years. Must have 20 pack year history.   Depression and Anxiety Screen done today and results listed below:     07/26/2022    8:25 AM  Depression screen PHQ 2/9  Decreased Interest 0  Down, Depressed, Hopeless 0  PHQ - 2 Score 0  Altered sleeping 0  Tired, decreased energy 1  Change in appetite 0  Feeling bad or failure about yourself  0  Trouble concentrating 0  Moving slowly or fidgety/restless 0  Suicidal thoughts 0  PHQ-9 Score 1  Difficult doing work/chores Not difficult at all      07/26/2022    8:25 AM  GAD 7 : Generalized Anxiety Score  Nervous,  Anxious, on Edge 0  Control/stop worrying 0  Worry too much - different things 0  Trouble relaxing 0  Restless 1  Easily annoyed or irritable 0  Afraid - awful might happen 0  Total GAD 7 Score 1  Anxiety Difficulty Not difficult at all    IMMUNIZATIONS: - Tdap: Tetanus vaccination status reviewed: {tetanus status:315746}. - HPV: {Blank single:19197::"Up to date","Administered today","Not applicable","Refused","Given elsewhere"} - Influenza: {Blank single:19197::"Up to date","Administered today","Postponed to flu season","Refused","Given elsewhere"} - Pneumovax: {Blank single:19197::"Up to date","Administered today","Not applicable","Refused","Given elsewhere"} - Prevnar 20: {Blank single:19197::"Up to date","Administered today","Not applicable","Refused","Given elsewhere"} - Zostavax (50+): {Blank single:19197::"Up to date","Administered today","Not applicable","Refused","Given elsewhere"}   Past medical history, surgical history, medications, allergies, family history and social history reviewed with patient today and changes made to appropriate areas of the chart.   Past Medical History:  Diagnosis Date   Anemia    Anxiety    Colon cancer (HCC) 01/2021   stage 2   Complication of anesthesia    took a long time to wake up after surgery   Family history of breast cancer 03/30/2021   Family history of colon cancer 03/30/2021    Past Surgical History:  Procedure Laterality Date   ABDOMINAL HYSTERECTOMY Bilateral 09/26/2016   Procedure: HYSTERECTOMY ABDOMINAL W/ BILATERAL SALPINGECTOMY;  Surgeon: Allie Bossier, MD;  Location: [facility];  Service: Gynecology;  Laterality: Bilateral;   BIOPSY  02/09/2021   Procedure: BIOPSY;  Surgeon: Kathi Der, MD;  Location: [facility];  Service: Gastroenterology;;   BREAST BIOPSY Left    COLONOSCOPY N/A 02/09/2021   Procedure: COLONOSCOPY;  Surgeon: Kathi Der, MD;  Location: [facility];  Service: Gastroenterology;  Laterality:  N/A;   LAPAROSCOPIC PARTIAL COLECTOMY N/A 02/10/2021   Procedure: ROBOTIC ASSISTED PARTIAL COLECTOMY WITH COLOSTOMY;  Surgeon: Luretha Murphy, MD;  Location: [facility];  Service:  General;  Laterality: N/A;   SUBMUCOSAL TATTOO INJECTION  02/09/2021   Procedure: SUBMUCOSAL TATTOO INJECTION;  Surgeon: Kathi Der, MD;  Location: [facility];  Service: Gastroenterology;;   TUBAL LIGATION      Current Outpatient Medications on File Prior to Visit  Medication Sig   acetaminophen (TYLENOL) 500 MG tablet Take 2 tablets (1,000 mg total) by mouth every 6 (six) hours as needed for mild pain.   methocarbamol (ROBAXIN) 750 MG tablet Take 1 tablet (750 mg total) by mouth every 8 (eight) hours as needed for muscle spasms. (Patient not taking: Reported on 07/26/2022)   ondansetron (ZOFRAN) 8 MG tablet Take 1 tablet (8 mg total) by mouth 2 (two) times daily as needed for refractory nausea / vomiting. Start on day 3 after chemotherapy.   pantoprazole (PROTONIX) 40 MG tablet Take 40 mg by mouth daily as needed.   polyethylene glycol (MIRALAX / GLYCOLAX) 17 g packet Take 17 g by mouth daily.   No current facility-administered medications on file prior to visit.    Allergies  Allergen Reactions   Ferrlecit [Na Ferric Gluc Cplx In Sucrose] Nausea And Vomiting     Social History   Socioeconomic History   Marital status: Single    Spouse name: Not on file   Number of children: 2   Years of education: Not on file   Highest education level: Not on file  Occupational History   Not on file  Tobacco Use   Smoking status: Never   Smokeless tobacco: Never  Vaping Use   Vaping status: Never Used  Substance and Sexual Activity   Alcohol use: Never   Drug use: No   Sexual activity: Not Currently    Partners: Male    Birth control/protection: Surgical  Other Topics Concern   Not on file  Social History Narrative   Not on file   Social Determinants of Health   Financial Resource Strain: Not on file   Food Insecurity: Not on file  Transportation Needs: Not on file  Physical Activity: Not on file  Stress: Not on file  Social Connections: Not on file  Intimate Partner Violence: Not on file   Social History   Tobacco Use  Smoking Status Never  Smokeless Tobacco Never   Social History   Substance and Sexual Activity  Alcohol Use Never    Family History  Problem Relation Age of Onset   Hyperlipidemia Father    Breast cancer Maternal Aunt    Lymphoma Maternal Aunt    Colon cancer Maternal Uncle 66   Lung cancer Maternal Uncle    Lymphoma Paternal Grandmother        d. 65   Breast cancer Cousin 3       maternal female cousin   Hypertension Other    Cancer Other        PGF's mother; d. early 75s; unknown type; mets     ROS: Denies fever, fatigue, unexplained weight loss/gain, chest pain, SHOB, and palpitations. Denies neurological deficits, gastrointestinal or genitourinary complaints, and skin changes.   Objective:   There were no vitals filed for this visit.  GENERAL APPEARANCE: Well-appearing, in NAD. Well nourished.  SKIN: Pink, warm and dry. Turgor normal. No rash, lesion, ulceration, or ecchymoses. Hair evenly distributed.  HEENT: HEAD: Normocephalic.  EYES: PERRLA. EOMI. Lids intact w/o defect. Sclera white, Conjunctiva pink w/o exudate.  EARS: External ear w/o redness, swelling, masses or lesions. EAC clear. TM's intact, translucent w/o bulging, appropriate landmarks visualized.  Appropriate acuity to conversational tones.  NOSE: Septum midline w/o deformity. Nares patent, mucosa pink and non-inflamed w/o drainage. No sinus tenderness.  THROAT: Uvula midline. Oropharynx clear. Tonsils non-inflamed w/o exudate. Oral mucosa pink and moist.  NECK: Supple, Trachea midline. Full ROM w/o pain or tenderness. No lymphadenopathy. Thyroid non-tender w/o enlargement or palpable masses.  BREASTS: Breasts pendulous, symmetrical, and w/o palpable masses. Nipples everted and  w/o discharge. No rash or skin retraction. No axillary or supraclavicular lymphadenopathy.  RESPIRATORY: Chest wall symmetrical w/o masses. Respirations even and non-labored. Breath sounds clear to auscultation bilaterally. No wheezes, rales, rhonchi, or crackles. CARDIAC: S1, S2 present, regular rate and rhythm. No gallops, murmurs, rubs, or clicks. PMI w/o lifts, heaves, or thrills. No carotid bruits. Capillary refill <2 seconds. Peripheral pulses 2+ bilaterally. GI: Abdomen soft w/o distention. Normoactive bowel sounds. No palpable masses or tenderness. No guarding or rebound tenderness. Liver and spleen w/o tenderness or enlargement. No CVA tenderness.  GU: External genitalia without erythema, lesions, or masses. No lymphadenopathy. Vaginal mucosa pink and moist without exudate, lesions, or ulcerations. Cervix pink without discharge. Cervical os closed. Uterus and adnexae palpable, not enlarged, and w/o tenderness. No palpable masses.  MSK: Muscle tone and strength appropriate for age, w/o atrophy or abnormal movement.  EXTREMITIES: Active ROM intact, w/o tenderness, crepitus, or contracture. No obvious joint deformities or effusions. No clubbing, edema, or cyanosis.  NEUROLOGIC: CN's II-XII intact. Motor strength symmetrical with no obvious weakness. No sensory deficits. DTR's 2+ symmetric bilaterally. Steady, even gait.  PSYCH/MENTAL STATUS: Alert, oriented x 3. Cooperative, appropriate mood and affect.   Chaperoned by Cristy Hilts, CMA   Results for orders placed or performed in visit on 08/09/22  CBC with Differential/Platelet  Result Value Ref Range   WBC 4.5 3.4 - 10.8 x10E3/uL   RBC 4.53 3.77 - 5.28 x10E6/uL   Hemoglobin 11.3 11.1 - 15.9 g/dL   Hematocrit 65.7 84.6 - 46.6 %   MCV 79 79 - 97 fL   MCH 24.9 (L) 26.6 - 33.0 pg   MCHC 31.7 31.5 - 35.7 g/dL   RDW 96.2 95.2 - 84.1 %   Platelets 250 150 - 450 x10E3/uL   Neutrophils 48 Not Estab. %   Lymphs 37 Not Estab. %   Monocytes 10  Not Estab. %   Eos 4 Not Estab. %   Basos 1 Not Estab. %   Neutrophils Absolute 2.2 1.4 - 7.0 x10E3/uL   Lymphocytes Absolute 1.7 0.7 - 3.1 x10E3/uL   Monocytes Absolute 0.4 0.1 - 0.9 x10E3/uL   EOS (ABSOLUTE) 0.2 0.0 - 0.4 x10E3/uL   Basophils Absolute 0.0 0.0 - 0.2 x10E3/uL   Immature Granulocytes 0 Not Estab. %   Immature Grans (Abs) 0.0 0.0 - 0.1 x10E3/uL  Comprehensive metabolic panel  Result Value Ref Range   Glucose 87 70 - 99 mg/dL   BUN 14 6 - 24 mg/dL   Creatinine, Ser 3.24 0.57 - 1.00 mg/dL   eGFR 86 >40 NU/UVO/5.36   BUN/Creatinine Ratio 16 9 - 23   Sodium 144 134 - 144 mmol/L   Potassium 4.3 3.5 - 5.2 mmol/L   Chloride 104 96 - 106 mmol/L   CO2 24 20 - 29 mmol/L   Calcium 10.0 8.7 - 10.2 mg/dL   Total Protein 7.8 6.0 - 8.5 g/dL   Albumin 4.5 3.9 - 4.9 g/dL   Globulin, Total 3.3 1.5 - 4.5 g/dL   Bilirubin Total 0.3 0.0 - 1.2 mg/dL   Alkaline Phosphatase  75 44 - 121 IU/L   AST 22 0 - 40 IU/L   ALT 9 0 - 32 IU/L  Lipid panel  Result Value Ref Range   Cholesterol, Total 235 (H) 100 - 199 mg/dL   Triglycerides 74 0 - 149 mg/dL   HDL 67 >16 mg/dL   VLDL Cholesterol Cal 13 5 - 40 mg/dL   LDL Chol Calc (NIH) 109 (H) 0 - 99 mg/dL   Chol/HDL Ratio 3.5 0.0 - 4.4 ratio  Hemoglobin A1c  Result Value Ref Range   Hgb A1c MFr Bld 6.5 (H) 4.8 - 5.6 %   Est. average glucose Bld gHb Est-mCnc 140 mg/dL  TSH  Result Value Ref Range   TSH 1.200 0.450 - 4.500 uIU/mL  Hepatitis C antibody  Result Value Ref Range   Hep C Virus Ab Non Reactive Non Reactive    Assessment & Plan:  ***  No orders of the defined types were placed in this encounter.   PATIENT COUNSELING:  - Encouraged a healthy well-balanced diet. Patient may adjust caloric intake to maintain or achieve ideal body weight. May reduce intake of dietary saturated fat and total fat and have adequate dietary potassium and calcium preferably from fresh fruits, vegetables, and low-fat dairy products.   - Advised to  avoid cigarette smoking. - Discussed with the patient that most people either abstain from alcohol or drink within safe limits (<=14/week and <=4 drinks/occasion for males, <=7/weeks and <= 3 drinks/occasion for females) and that the risk for alcohol disorders and other health effects rises proportionally with the number of drinks per week and how often a drinker exceeds daily limits. - Discussed cessation/primary prevention of drug use and availability of treatment for abuse.  - Discussed sexually transmitted diseases, avoidance of unintended pregnancy and contraceptive alternatives.  - Stressed the importance of regular exercise - Injury prevention: Discussed safety belts, safety helmets, smoke detector, smoking near bedding or upholstery.  - Dental health: Discussed importance of regular tooth brushing, flossing, and dental visits.   NEXT PREVENTATIVE PHYSICAL DUE IN 1 YEAR.  No follow-ups on file.  Patient to reach out to office if new, worrisome, or unresolved symptoms arise or if no improvement in patient's condition. Patient verbalized understanding and is agreeable to treatment plan. All questions answered to patient's satisfaction.    Yolanda Manges, FNP

## 2022-08-16 ENCOUNTER — Ambulatory Visit (INDEPENDENT_AMBULATORY_CARE_PROVIDER_SITE_OTHER): Payer: Medicaid Other | Admitting: Family Medicine

## 2022-08-16 ENCOUNTER — Encounter (HOSPITAL_BASED_OUTPATIENT_CLINIC_OR_DEPARTMENT_OTHER): Payer: Self-pay | Admitting: Family Medicine

## 2022-08-16 ENCOUNTER — Other Ambulatory Visit: Payer: Self-pay

## 2022-08-16 VITALS — BP 110/65 | HR 69 | Ht 65.0 in | Wt 157.0 lb

## 2022-08-16 DIAGNOSIS — E119 Type 2 diabetes mellitus without complications: Secondary | ICD-10-CM | POA: Diagnosis not present

## 2022-08-16 DIAGNOSIS — N898 Other specified noninflammatory disorders of vagina: Secondary | ICD-10-CM

## 2022-08-16 DIAGNOSIS — C187 Malignant neoplasm of sigmoid colon: Secondary | ICD-10-CM

## 2022-08-16 DIAGNOSIS — Z0000 Encounter for general adult medical examination without abnormal findings: Secondary | ICD-10-CM | POA: Diagnosis not present

## 2022-08-16 NOTE — Patient Instructions (Signed)
Affordable Dental Services for Adults   [facility] [address] [place], [place] [address] [phone] Access provides dental services to  uninsured [facility] residents  who are enrolled in the [facility]. Services will  be limited to comprehensive  examinations, extractions, fillings, pain  management and some minor  restorative care. In order to qualify for  services, patients will be referred by  [facility]  [facility]) Safety Net Organizations that  already provide medical care to  residents with incomes between 0% and  200% of the federal poverty level.   Payment Options $40.00 per visit  (CASH ONLY)    [facility] Our [facility] is open Monday through Thursday from 7:30 am - 4:00 pm. The dental clinic is available for adults and children with:  [place] Medicaid, [place] [employer] Choice, [employer], and Self-Pay. Uninsured patients can be seen on a sliding fee scale based on household income.    [facility] [address] [place], [place] [address] Phone [phone] Fax [phone]  Our clinic is open Monday through Friday 8:00 a.m. until 5:00 p.m, with the exception of Wednesday 10 a.m. to 5 p.m.  Medicaid and other insurance plans are welcome. Payment for services is due when services are rendered and may be made by cash or credit card.  If you have dental insurance, we will assist you with your claim submission

## 2022-08-17 ENCOUNTER — Ambulatory Visit
Admission: RE | Admit: 2022-08-17 | Discharge: 2022-08-17 | Disposition: A | Discharge status: Home / Self Care | Payer: Medicaid Other | Source: Ambulatory Visit | Attending: Family Medicine | Admitting: Family Medicine

## 2022-08-17 ENCOUNTER — Ambulatory Visit (HOSPITAL_BASED_OUTPATIENT_CLINIC_OR_DEPARTMENT_OTHER)
Admission: RE | Admit: 2022-08-17 | Discharge: 2022-08-17 | Disposition: A | Discharge status: Home / Self Care | Payer: Medicaid Other | Source: Ambulatory Visit | Attending: Hematology | Admitting: Hematology

## 2022-08-17 DIAGNOSIS — C187 Malignant neoplasm of sigmoid colon: Secondary | ICD-10-CM | POA: Diagnosis not present

## 2022-08-17 DIAGNOSIS — Z1231 Encounter for screening mammogram for malignant neoplasm of breast: Secondary | ICD-10-CM

## 2022-08-17 MED ORDER — IOHEXOL 300 MG/ML  SOLN
100.0000 mL | Freq: Once | INTRAMUSCULAR | Status: AC | PRN
  Administered 2022-08-17: 75 mL via INTRAVENOUS

## 2022-08-18 ENCOUNTER — Telehealth: Payer: Self-pay

## 2022-08-18 ENCOUNTER — Ambulatory Visit (HOSPITAL_COMMUNITY): Admission: RE | Admit: 2022-08-18 | Payer: Medicaid Other | Source: Ambulatory Visit

## 2022-08-18 NOTE — Telephone Encounter (Signed)
Confirmed receipt of stat CT results. Results printed and shared with Dr. [name].

## 2022-08-18 NOTE — Telephone Encounter (Signed)
Patient informed that CT results were in and Dr. [name] wanted to offer her an earlier slot today to review those results. Patient would like to keep original appointment on Thursday, 8/1, as she would not be able to get off early enough today to be make appointment.  Dr. [name] aware. Confirmed upcoming appointments with patient.

## 2022-08-18 NOTE — Progress Notes (Signed)
Your mammogram results show no evidence of breast cancer. We will plan to repeat this in 1 year for routine screening.

## 2022-08-18 NOTE — Telephone Encounter (Signed)
Attempted to call patient regarding coming in for earlier review of recent CT scan results. Unable to leave voicemail at this time.  Will attempt to call back at a later time.

## 2022-08-21 ENCOUNTER — Other Ambulatory Visit (HOSPITAL_BASED_OUTPATIENT_CLINIC_OR_DEPARTMENT_OTHER): Payer: Self-pay | Admitting: Family Medicine

## 2022-08-21 MED ORDER — METRONIDAZOLE 0.75 % VA GEL: 1.0000 | Freq: Every day | VAGINAL | 0 refills | Status: AC

## 2022-08-21 NOTE — Progress Notes (Signed)
Please let patient know that her vaginal swab does indicate the likelihood of BV.  I can send in treatment for her either with oral antibiotics or vaginal antibiotic cream.  Please let me know her preference

## 2022-08-23 NOTE — Assessment & Plan Note (Addendum)
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 [name]. 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 Margins and lymph nodes negative. -she received adjuvant CAPOX 03/14/21 - 05/23/21 to reduce her risk of recurrence. she had additional Xeloda alone to complete 6 months of treatment.  -surveillance colonoscopy on 06/21/21 with Dr. Leonides Schanz was unremarkable. Plan for repeat in 1 year. -restaging CT [facility] on 08/11/21 showed: a soft tissue nodule favored to be related to left ovary, but adenopathy not completely excluded. -She is clinically doing well, no concern for recurrence. -She had surveillance CT scan 02/21/2022 showed no definitive evidence of recurrence, stable soft tissue nodule in the left pelvis, felt to be left ovary.  CT scan also showed a few small subpleural nodule in her lungs, will follow-up closely. -Repeated CT chest from Jun 09, 2022 showed a stable tiny lung nodules, likely benign. -Due to rising CEA, we repeated CT on 08/17/2022 which showed enlarging rounded lesion along the LEFT iliac vessels, now measuring 4.2cm, concerning for cancer recurrence. I reviewed with pt. I recommend IR biopsy

## 2022-08-24 ENCOUNTER — Other Ambulatory Visit: Payer: Self-pay

## 2022-08-24 ENCOUNTER — Encounter: Payer: Self-pay | Admitting: Hematology

## 2022-08-24 ENCOUNTER — Inpatient Hospital Stay: Payer: Medicaid Other | Attending: Hematology | Admitting: Hematology

## 2022-08-24 ENCOUNTER — Other Ambulatory Visit: Payer: Medicaid Other

## 2022-08-24 VITALS — BP 119/78 | HR 79 | Temp 98.0°F | Resp 18 | Ht 65.0 in | Wt 155.9 lb

## 2022-08-24 DIAGNOSIS — Z9049 Acquired absence of other specified parts of digestive tract: Secondary | ICD-10-CM | POA: Diagnosis not present

## 2022-08-24 DIAGNOSIS — Z800 Family history of malignant neoplasm of digestive organs: Secondary | ICD-10-CM | POA: Insufficient documentation

## 2022-08-24 DIAGNOSIS — Z1289 Encounter for screening for malignant neoplasm of other sites: Secondary | ICD-10-CM | POA: Diagnosis not present

## 2022-08-24 DIAGNOSIS — Z85038 Personal history of other malignant neoplasm of large intestine: Secondary | ICD-10-CM | POA: Insufficient documentation

## 2022-08-24 DIAGNOSIS — Z803 Family history of malignant neoplasm of breast: Secondary | ICD-10-CM | POA: Diagnosis not present

## 2022-08-24 DIAGNOSIS — E119 Type 2 diabetes mellitus without complications: Secondary | ICD-10-CM | POA: Diagnosis not present

## 2022-08-24 DIAGNOSIS — C187 Malignant neoplasm of sigmoid colon: Secondary | ICD-10-CM | POA: Diagnosis not present

## 2022-08-24 NOTE — Progress Notes (Signed)
[facility]   [phone] [phone]   Clinic Follow up Note   Patient Care Team: Caudle, Shelton Silvas, FNP as PCP - General (Family Medicine) Luretha Murphy, MD as Consulting Physician (General Surgery) Kathi Der, MD as Consulting Physician (Gastroenterology) Malachy Mood, MD as Consulting Physician (Hematology) Allie Bossier, MD as Consulting Physician (Obstetrics and Gynecology)  Date of Service:  08/24/2022  CHIEF COMPLAINT: f/u of  sigmoid colon cancer     CURRENT THERAPY:  Surveillance  ASSESSMENT:  Barbara Valdez is a 47 y.o. 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 [name]. 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 Margins and lymph nodes negative. -she received adjuvant CAPOX 03/14/21 - 05/23/21 to reduce her risk of recurrence. she had additional Xeloda alone to complete 6 months of treatment.  -surveillance colonoscopy on 06/21/21 with Dr. Leonides Schanz was unremarkable. Plan for repeat in 1 year. -restaging CT AP on 08/11/21 showed: a soft tissue nodule favored to be related to left ovary, but adenopathy not completely excluded. -She is clinically doing well, no concern for recurrence. -She had surveillance CT scan 02/21/2022 showed no definitive evidence of recurrence, stable soft tissue nodule in the left pelvis, felt to be left ovary.  CT scan also showed a few small subpleural nodule in her lungs, will follow-up closely. -Repeated CT chest from Jun 09, 2022 showed a stable tiny lung nodules, likely benign. -Due to rising CEA, we repeated CT on 08/17/2022 which showed enlarging rounded lesion along the LEFT iliac vessels, now measuring 4.2cm, concerning for cancer recurrence. I reviewed with pt. I recommend PET scan for further evaluation.  I will also call her gynecologist to see if they  can see her with pelvic US for further evaluation -I will call her with the PET scan results.  If that is only lesion positive on PET, we may recommend surgical resection if biopsy is not feasible.   PLAN: - reviewed ct scan - order PET scan to be done in the next few weeks, I will call her with PET scan results -Will reach out to her GYN for her evaluation.    SUMMARY OF ONCOLOGIC HISTORY: Oncology History Overview Note   Cancer Staging  Cancer of sigmoid colon [facility]) Staging form: Colon and Rectum, AJCC 8th Edition - Pathologic stage from 02/10/2021: Stage IIB (pT4a, pN0, cM0) - Signed by Malachy Mood, MD on 02/28/2021    Cancer of sigmoid colon (HCC)  11/27/2020 Imaging   CLINICAL DATA:  Abdominal pain that is began Monday. Patient reports seen scant amounts of dark blood in stool. Feels bloated.   EXAM: CT ABDOMEN AND PELVIS WITH CONTRAST  IMPRESSION: 1. Proctocolitis involving the mid and distal sigmoid colon and upper rectum with a 3.9 cm gas containing interloop abscess centered in the sigmoid mesocolon. 2. Severe fecal burden.  No findings of bowel obstruction. 3. An enlarged left common iliac lymph node is likely reactive. 4. A 2.7 cm hemangioma is noted in the right hepatic lobe. 5. Mild focal bronchiolitis in the right lower lobe.   02/07/2021 Imaging   CLINICAL DATA:  Abdominal pain, cramping, constipation   EXAM: CT ABDOMEN AND PELVIS WITH CONTRAST  IMPRESSION: 1. There is severe, circumferential wall thickening and mucosal hyperenhancement involving the distal sigmoid colon and rectum to the anus, generally tethered appearance unchanged, involving at least 15-20 cm of  distal sigmoid and rectum. 2. There is again seen an abscess at the medial aspect of the rectosigmoid junction measuring approximately 3.0 x 1.6 cm. 3. Findings are generally consistent with nonspecific infectious, inflammatory, or ischemic colitis, including inflammatory bowel disease such as Crohn's  disease or ulcerative colitis; however an underlying malignancy is very difficult to exclude, particularly given substantial wall thickening. 4. Large burden of stool throughout the extremely redundant proximal colon, likely reflecting some degree of obstipation. 5. Enlarged left external iliac lymph nodes measuring up to 1.1 x 0.9 cm fall, nonspecific, possibly reactive, however nodal metastatic disease is not excluded per above.   02/09/2021 Initial Biopsy   FINAL MICROSCOPIC DIAGNOSIS:   A. COLON MASS, SIGMOID, BIOPSY:  - Adenomatous lesion with extensive high-grade dysplasia, see comment   B. RECTAL, COLON, SIGMOID, BIOPSY:  - Mild acute/ subacute nonspecific colitis, see comment  - Negative for definite features of chronicity, granulomas or dysplasia   COMMENT:  A.  Evidence of invasive carcinoma is not seen in the submitted biopsies but the biopsy fragments appear superficial and a more severe deeper process cannot be ruled out.  B.  Histologic features in the rectosigmoid biopsies are not compatible with untreated inflammatory bowel disease.  Differential diagnosis can include infection, drug-effect and stercoral proctitis among other possibilities.    02/09/2021 Procedure   Colonoscopy, Dr. Levora Angel  Impression: - Preparation of the colon was fair. - Likely malignant partially obstructing tumor in the distal sigmoid colon. Biopsied. Tattooed. - Congested, inflamed and thickened folds of the mucosa in the recto-sigmoid colon. Biopsied. Tattooed.   02/10/2021 Cancer Staging   Staging form: Colon and Rectum, AJCC 8th Edition - Pathologic stage from 02/10/2021: Stage IIB (pT4a, pN0, cM0) - Signed by Malachy Mood, MD on 02/28/2021 Stage prefix: Initial diagnosis Total positive nodes: 0 Histologic grading system: 4 grade system Histologic grade (G): G2 Residual tumor (R): R0 - None   02/10/2021 Definitive Surgery   FINAL MICROSCOPIC DIAGNOSIS:   A. COLON, SIGMOID:  - Invasive  moderately differentiated adenocarcinoma.  - Twenty-seven lymph nodes, negative for metastatic carcinoma (0/27).  - See oncology table below.   B. COLON, DISTAL, SIGMOID:  - Segment of colon with serosal adhesions, acute inflammation, and tattoo pigment.  - Negative for malignancy.    02/11/2021 Initial Diagnosis   Cancer of sigmoid colon (HCC)   03/14/2021 - 05/23/2021 Chemotherapy   Patient is on Treatment Plan : COLORECTAL Xelox (Capeox) q21d     04/20/2021 Genetic Testing   Negative hereditary cancer genetic testing: no pathogenic variants detected in [employer] CustomNext-Cancer +RNAinsight Panel.  Report date is April 20, 2021.   The CustomNext-Cancer+RNAinsight panel offered by [employer] includes sequencing and rearrangement analysis for the following 47 genes:  APC, ATM, AXIN2, BARD1, BMPR1A, BRCA1, BRCA2, BRIP1, CDH1, CDK4, CDKN2A, CHEK2, DICER1, EPCAM, GREM1, HOXB13, MEN1, MLH1, MSH2, MSH3, MSH6, MUTYH, NBN, NF1, NF2, NTHL1, PALB2, PMS2, POLD1, POLE, PTEN, RAD51C, RAD51D, RECQL, RET, SDHA, SDHAF2, SDHB, SDHC, SDHD, SMAD4, SMARCA4, STK11, TP53, TSC1, TSC2, and VHL.  RNA data is routinely analyzed for use in variant interpretation for all genes.    02/20/2022 Imaging    IMPRESSION: 1. Similar postsurgical changes of partial sigmoidectomy with Hartmann's pouch formation and left anterior abdominal wall colostomy. No noncontrast CT evidence of local recurrence. New nonobstructive bowel/fluid containing peristomal hernia. 2. No convincing evidence of metastatic disease within the chest, abdomen, or pelvis on this noncontrast enhanced examination. 3. New multifocal subpleural foci of nodularity correspond with regions  of atelectasis seen on prior imaging, favored to reflect subsegmental atelectasis. Stable 3 mm right lower lobe pulmonary nodule, favored benign. Consider attention on dedicated short-term interval follow-up chest CT. 4. Stable soft tissue along the left iliac vessels  again favored to reflect left ovary given its continuity with the gonadal vessels but difficult to evaluate given lack of intravenous contrast material. Correlation with CEA levels and continued attention on follow-up imaging suggested. 5. Moderate volume of formed stool throughout the colon. Correlate for constipation.      INTERVAL HISTORY:  Barbara Valdez is here for a follow up of   sigmoid colon cancer . She was last seen by me on 07/11/2022. She presents to clinic today with daughter and mom. She is feeling well overall no issues. She has lost some weight but found out she is a diabetic and has changed eating habits.     All other systems were reviewed with the patient and are negative.  MEDICAL HISTORY:  Past Medical History:  Diagnosis Date   Anemia    Anxiety    Colon cancer (HCC) 01/2021   stage 2   Complication of anesthesia    took a long time to wake up after surgery   Family history of breast cancer 03/30/2021   Family history of colon cancer 03/30/2021    SURGICAL HISTORY: Past Surgical History:  Procedure Laterality Date   ABDOMINAL HYSTERECTOMY Bilateral 09/26/2016   Procedure: HYSTERECTOMY ABDOMINAL W/ BILATERAL SALPINGECTOMY;  Surgeon: Allie Bossier, MD;  Location: [facility];  Service: Gynecology;  Laterality: Bilateral;   BIOPSY  02/09/2021   Procedure: BIOPSY;  Surgeon: Kathi Der, MD;  Location: [facility];  Service: Gastroenterology;;   BREAST BIOPSY Left    COLONOSCOPY N/A 02/09/2021   Procedure: COLONOSCOPY;  Surgeon: Kathi Der, MD;  Location: [facility];  Service: Gastroenterology;  Laterality: N/A;   LAPAROSCOPIC PARTIAL COLECTOMY N/A 02/10/2021   Procedure: ROBOTIC ASSISTED PARTIAL COLECTOMY WITH COLOSTOMY;  Surgeon: Luretha Murphy, MD;  Location: [facility];  Service: General;  Laterality: N/A;   SUBMUCOSAL TATTOO INJECTION  02/09/2021   Procedure: SUBMUCOSAL TATTOO INJECTION;  Surgeon: Kathi Der, MD;  Location: [facility];   Service: Gastroenterology;;   TUBAL LIGATION      I have reviewed the social history and family history with the patient and they are unchanged from previous note.  ALLERGIES:  is allergic to ferrlecit [na ferric gluc cplx in sucrose].  MEDICATIONS:  Current Outpatient Medications  Medication Sig Dispense Refill   acetaminophen (TYLENOL) 500 MG tablet Take 2 tablets (1,000 mg total) by mouth every 6 (six) hours as needed for mild pain. 30 tablet 0   metroNIDAZOLE (METROGEL) 0.75 % vaginal gel Place 1 Applicatorful vaginally at bedtime for 5 days. 70 g 0   ondansetron (ZOFRAN) 8 MG tablet Take 1 tablet (8 mg total) by mouth 2 (two) times daily as needed for refractory nausea / vomiting. Start on day 3 after chemotherapy. 30 tablet 1   pantoprazole (PROTONIX) 40 MG tablet Take 40 mg by mouth daily as needed.     polyethylene glycol (MIRALAX / GLYCOLAX) 17 g packet Take 17 g by mouth daily. 14 each 0   No current facility-administered medications for this visit.    PHYSICAL EXAMINATION: ECOG PERFORMANCE STATUS: 0 - Asymptomatic  Vitals:   08/24/22 0932  BP: 119/78  Pulse: 79  Resp: 18  Temp: 98 F (36.7 C)   Wt Readings from Last 3 Encounters:  08/24/22 155 lb 14.4  oz (70.7 kg)  08/16/22 157 lb (71.2 kg)  07/26/22 158 lb (71.7 kg)     GENERAL:alert, no distress and comfortable SKIN: skin color, texture, turgor are normal, no rashes or significant lesions EYES: normal, Conjunctiva are pink and non-injected, sclera clear NECK: supple, thyroid normal size, non-tender, without nodularity LYMPH:  no palpable lymphadenopathy in the cervical, axillary  LUNGS: clear to auscultation and percussion with normal breathing effort HEART: regular rate & rhythm and no murmurs and no lower extremity edema ABDOMEN:abdomen soft, non-tender and normal bowel sounds Musculoskeletal:no cyanosis of digits and no clubbing  NEURO: alert & oriented x 3 with fluent speech, no focal motor/sensory  deficits  LABORATORY DATA:  I have reviewed the data as listed    Latest Ref Rng & Units 08/09/2022    8:46 AM 07/25/2022    8:18 AM 07/11/2022    8:17 AM  CBC  WBC 3.4 - 10.8 x10E3/uL 4.5  4.7  4.8   Hemoglobin 11.1 - 15.9 g/dL 16.1  09.6  04.5   Hematocrit 34.0 - 46.6 % 35.7  37.8  38.4   Platelets 150 - 450 x10E3/uL 250  246  262         Latest Ref Rng & Units 08/09/2022    8:46 AM 07/25/2022    8:18 AM 07/11/2022    8:17 AM  CMP  Glucose 70 - 99 mg/dL 87  86  94   BUN 6 - 24 mg/dL 14  9  9    Creatinine 0.57 - 1.00 mg/dL 4.09  8.11  9.14   Sodium 134 - 144 mmol/L 144  143  142   Potassium 3.5 - 5.2 mmol/L 4.3  4.0  3.8   Chloride 96 - 106 mmol/L 104  108  107   CO2 20 - 29 mmol/L 24  28  28    Calcium 8.7 - 10.2 mg/dL 78.2  9.2  9.9   Total Protein 6.0 - 8.5 g/dL 7.8  7.6  7.7   Total Bilirubin 0.0 - 1.2 mg/dL 0.3  0.4  0.5   Alkaline Phos 44 - 121 IU/L 75  66  73   AST 0 - 40 IU/L 22  19  21    ALT 0 - 32 IU/L 9  7  11        RADIOGRAPHIC STUDIES: I have personally reviewed the radiological images as listed and agreed with the findings in the report. No results found.    Orders Placed This Encounter  Procedures   NM PET Image Initial (PI) Skull Base To Thigh    Standing Status:   Future    Standing Expiration Date:   08/24/2023    Order Specific Question:   If indicated for the ordered procedure, I authorize the administration of a radiopharmaceutical per Radiology protocol    Answer:   Yes    Order Specific Question:   Is the patient pregnant?    Answer:   No    Order Specific Question:   Preferred imaging location?    Answer:   [facility]   All questions were answered. The patient knows to call the clinic with any problems, questions or concerns. No barriers to learning was detected. The total time spent in the appointment was 30 minutes.     Malachy Mood, MD 08/24/2022   I, Sharlette Dense, CMA, am acting as scribe for Malachy Mood, MD.   I have reviewed the above  documentation for accuracy and completeness, and I agree with  the above.

## 2022-08-25 ENCOUNTER — Telehealth: Payer: Self-pay

## 2022-08-25 ENCOUNTER — Telehealth: Payer: Self-pay | Admitting: Hematology

## 2022-08-25 ENCOUNTER — Telehealth (HOSPITAL_BASED_OUTPATIENT_CLINIC_OR_DEPARTMENT_OTHER): Payer: Self-pay | Admitting: Family Medicine

## 2022-08-25 DIAGNOSIS — R1900 Intra-abdominal and pelvic swelling, mass and lump, unspecified site: Secondary | ICD-10-CM

## 2022-08-25 DIAGNOSIS — C187 Malignant neoplasm of sigmoid colon: Secondary | ICD-10-CM

## 2022-08-25 NOTE — Telephone Encounter (Signed)
Voicemail received from Dr. Latanya Maudlin office today. They are requesting a call back from Dr. [name] regarding this mutual patient. Dr. Latanya Maudlin personal cell number is [phone] when able to call.

## 2022-08-25 NOTE — Telephone Encounter (Signed)
Contacted Dr. [name], concerned for new finding of a new pelvic mass on recent PET scan, has h/o colon cancer.  Would like for patient to be evaluated with pelvic ultrasound. I've ordered one stat, to be performed at the hospital in the next few days.  Please notify patient when this has been scheduled, and place on Dr. Ellin Saba schedule to be seen as soon as possible after ultrasound performed.

## 2022-08-25 NOTE — Telephone Encounter (Addendum)
Called Dr. Norman Clay office to have her call Dr. [name] on her cell phone had to leave a message also forwarded last office note and CT results to Dr. [name]   ----- Message from Malachy Mood sent at 08/24/2022  3:36 PM EDT ----- Delice Bison, please get her PET approved asap.   Peggy, please schedule ASAP.   Dyanne Carrel, please call her GYN and let them call me back at my cell, send my note and CT report to her office. Also schedule a phone visit 2-3 days after PET  Thanks  Terrace Arabia

## 2022-08-25 NOTE — Telephone Encounter (Signed)
Patient is scheduled for an ultrasound and a follow-up with Dr. [name] on 09/05/22.

## 2022-08-25 NOTE — Addendum Note (Signed)
Addended by: Fabian November on: 08/25/2022 10:26 AM   Modules accepted: Orders

## 2022-08-25 NOTE — Telephone Encounter (Signed)
Please call the pt regarding medication  

## 2022-08-28 ENCOUNTER — Ambulatory Visit (INDEPENDENT_AMBULATORY_CARE_PROVIDER_SITE_OTHER): Payer: Medicaid Other

## 2022-08-28 ENCOUNTER — Other Ambulatory Visit (HOSPITAL_BASED_OUTPATIENT_CLINIC_OR_DEPARTMENT_OTHER): Payer: Self-pay | Admitting: Family Medicine

## 2022-08-28 DIAGNOSIS — R1900 Intra-abdominal and pelvic swelling, mass and lump, unspecified site: Secondary | ICD-10-CM

## 2022-08-28 DIAGNOSIS — C187 Malignant neoplasm of sigmoid colon: Secondary | ICD-10-CM

## 2022-08-28 NOTE — Telephone Encounter (Signed)
Pt is calling again --can't get RX  Please call the patient   [phone]

## 2022-08-28 NOTE — Telephone Encounter (Signed)
Alexis, please advise on this.

## 2022-09-01 ENCOUNTER — Ambulatory Visit (HOSPITAL_COMMUNITY)
Admission: RE | Admit: 2022-09-01 | Discharge: 2022-09-01 | Disposition: A | Discharge status: Home / Self Care | Payer: Medicaid Other | Source: Ambulatory Visit | Attending: Hematology | Admitting: Hematology

## 2022-09-01 ENCOUNTER — Telehealth: Payer: Self-pay | Admitting: Hematology

## 2022-09-01 DIAGNOSIS — Z1289 Encounter for screening for malignant neoplasm of other sites: Secondary | ICD-10-CM | POA: Insufficient documentation

## 2022-09-05 ENCOUNTER — Encounter: Payer: Self-pay | Admitting: Pharmacist

## 2022-09-05 ENCOUNTER — Ambulatory Visit (HOSPITAL_COMMUNITY)
Admission: RE | Admit: 2022-09-05 | Discharge: 2022-09-05 | Disposition: A | Discharge status: Home / Self Care | Payer: Medicaid Other | Source: Ambulatory Visit | Attending: Hematology | Admitting: Hematology

## 2022-09-05 ENCOUNTER — Ambulatory Visit: Payer: Medicaid Other | Admitting: Obstetrics & Gynecology

## 2022-09-05 ENCOUNTER — Encounter: Payer: Self-pay | Admitting: Obstetrics & Gynecology

## 2022-09-05 ENCOUNTER — Other Ambulatory Visit (HOSPITAL_COMMUNITY): Payer: Self-pay

## 2022-09-05 ENCOUNTER — Other Ambulatory Visit: Payer: Self-pay

## 2022-09-05 ENCOUNTER — Telehealth: Payer: Medicaid Other | Admitting: Hematology

## 2022-09-05 VITALS — BP 104/68 | HR 74 | Ht 66.0 in | Wt 153.0 lb

## 2022-09-05 DIAGNOSIS — R1900 Intra-abdominal and pelvic swelling, mass and lump, unspecified site: Secondary | ICD-10-CM

## 2022-09-05 DIAGNOSIS — N920 Excessive and frequent menstruation with regular cycle: Secondary | ICD-10-CM | POA: Insufficient documentation

## 2022-09-05 DIAGNOSIS — C187 Malignant neoplasm of sigmoid colon: Secondary | ICD-10-CM | POA: Insufficient documentation

## 2022-09-05 DIAGNOSIS — Z1289 Encounter for screening for malignant neoplasm of other sites: Secondary | ICD-10-CM | POA: Insufficient documentation

## 2022-09-05 DIAGNOSIS — R232 Flushing: Secondary | ICD-10-CM | POA: Diagnosis not present

## 2022-09-05 LAB — GLUCOSE, CAPILLARY: Glucose-Capillary: 79 mg/dL (ref 70–99)

## 2022-09-05 MED ORDER — METRONIDAZOLE 500 MG PO TABS
500.0000 mg | ORAL_TABLET | Freq: Two times a day (BID) | ORAL | 0 refills | Status: DC
  Filled 2022-09-05 – 2022-09-26 (×2): qty 14, 7d supply, fill #0

## 2022-09-05 MED ORDER — FLUDEOXYGLUCOSE F - 18 (FDG) INJECTION
8.0000 | Freq: Once | INTRAVENOUS | Status: AC | PRN
  Administered 2022-09-05: 7.63 via INTRAVENOUS

## 2022-09-05 NOTE — Progress Notes (Signed)
    GYNECOLOGY PROGRESS NOTE  Subjective:    Patient ID: Barbara Valdez, female    DOB: 18-Jan-1976, 47 y.o.   MRN: [id number]  HPI  Patient is a 47 y.o. [id number] here for follow up of an ultrasound done recently. She was diagnosed with Stage 2b colon cancer last year, s/p resection. Her CEA has been increasing so a CT was done. It showed a pelvic mass, so an ultrasound was done (see report). There is a pelvic mass, ovary not clearly seen. She has a PET scan tomorrow and follow up with her onc after PET scan. She has hot flashes, not sexually active currently. She was recently diagnosed with a recurrent case of BV.  Of note, I did a TAH/BS in 2018. Her pathology report was normal.   The following portions of the patient's history were reviewed and updated as appropriate: allergies, current medications, past family history, past medical history, past social history, past surgical history, and problem list.  Review of Systems Pertinent items are noted in HPI.   Objective:   Blood pressure 104/68, pulse 74, height 5\' 6"  (1.676 m), weight 153 lb (69.4 kg), last menstrual period 09/26/2016. Body mass index is 24.69 kg/m. Well nourished, well hydrated Black female, no apparent distress    Assessment:   1. Pelvic mass   2. Hot flashes      Plan:   1. Pelvic mass - uncertain as to its origin, may need pathology/biopsy to determine actual origin - CA 125  2. Hot flashes  - FSH  3. 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 I prescribed flagyl as a back up, but suggested that she do a trial of boric acid suppositories first. Come back prn

## 2022-09-07 ENCOUNTER — Encounter: Payer: Self-pay | Admitting: Hematology

## 2022-09-07 ENCOUNTER — Telehealth: Payer: Self-pay

## 2022-09-07 ENCOUNTER — Inpatient Hospital Stay (HOSPITAL_BASED_OUTPATIENT_CLINIC_OR_DEPARTMENT_OTHER): Payer: Medicaid Other | Admitting: Hematology

## 2022-09-07 DIAGNOSIS — C187 Malignant neoplasm of sigmoid colon: Secondary | ICD-10-CM | POA: Diagnosis not present

## 2022-09-07 NOTE — Progress Notes (Signed)
Verbal order w/readback given from Dr. [name] for stat ambulatory referral to GYN Oncology for Dr. [name].  Order placed and Dr. Winferd Humphrey office made aware by Dr. [name].

## 2022-09-07 NOTE — Progress Notes (Signed)
[facility]   Telephone:(336) [phone] Fax:(336) [phone]   Clinic Follow up Note   Patient Care Team: Caudle, Shelton Silvas, FNP as PCP - General (Family Medicine) Luretha Murphy, MD as Consulting Physician (General Surgery) Kathi Der, MD as Consulting Physician (Gastroenterology) Malachy Mood, MD as Consulting Physician (Hematology) Allie Bossier, MD as Consulting Physician (Obstetrics and Gynecology)  Date of Service:  09/07/2022  I connected with Barbara Valdez on 09/07/2022 at  8:00 AM EDT by telephone visit and verified that I am speaking with the correct person using two identifiers.  I discussed the limitations, risks, security and privacy concerns of performing an evaluation and management service by telephone and the availability of in person appointments. I also discussed with the patient that there may be a patient responsible charge related to this service. The patient expressed understanding and agreed to proceed.   Other persons participating in the visit and their role in the encounter:  No  Patient's location:  Home Provider's location:  [facility] Office  CHIEF COMPLAINT: f/u of  sigmoid colon cancer    CURRENT THERAPY:  Surveillance   ASSESSMENT & PLAN:  Barbara Valdez is a 47 y.o. 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 [name]. 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 Margins and lymph nodes negative. -she received adjuvant CAPOX 03/14/21 - 05/23/21 to reduce her risk of recurrence. she had additional Xeloda alone to complete 6 months of treatment.  -surveillance colonoscopy on 06/21/21 with Dr. Leonides Schanz was unremarkable. Plan for repeat in 1 year. -restaging CT AP on 08/11/21 showed: a soft tissue nodule favored to be related to left ovary, but adenopathy not completely excluded. -She is  clinically doing well, no concern for recurrence. -She had surveillance CT scan 02/21/2022 showed no definitive evidence of recurrence, stable soft tissue nodule in the left pelvis, felt to be left ovary.  CT scan also showed a few small subpleural nodule in her lungs, will follow-up closely. -Repeated CT chest from Jun 09, 2022 showed a stable tiny lung nodules, likely benign. -Due to rising CEA, we repeated CT on 08/17/2022 which showed enlarging rounded lesion along the LEFT iliac vessels, now measuring 4.2cm, concerning for cancer recurrence. -I reviewed her PET scan findings, which showed Intensely hypermetabolic mass in the LEFT pelvis is consistent with metastatic lymphadenopathy or local recurrence of colorectal carcinoma. -pt has seen her GYN Dr. [name] on 8/13, pelvic ultrasound showed a 5.3 cm mass in the left pelvis, suggesting metastatic lymphadenopathy or local recurrence of colon cancer, left ovary could not be seen by ultrasound.  I spoke with Dr. [name] today, and she recommend GYN oncologist referral. -I will reach out to our GYN oncologist Dr. [name] and colorectal surgeons, for surgical resection, given the isolated recurrence. -I plan to see her back after surgery to discuss adjuvant chemo vs observation.   PLAN: -I reviewed the PET scan findings -I spoke with gynecologist Dr. [name], and also GYN oncologist Dr. [name] about her case today -Will make urgent referral to Dr. [name] and colorectal surgeon for surgery   SUMMARY OF ONCOLOGIC HISTORY: Oncology History Overview Note   Cancer Staging  Cancer of sigmoid colon [facility]) Staging form: Colon and Rectum, AJCC 8th Edition - Pathologic stage from 02/10/2021: Stage IIB (pT4a, pN0, cM0) - Signed by Malachy Mood, MD on 02/28/2021    Cancer  of sigmoid colon (HCC)  11/27/2020 Imaging   CLINICAL DATA:  Abdominal pain that is began Monday. Patient reports seen scant amounts of dark blood in stool. Feels bloated.   EXAM: CT ABDOMEN AND PELVIS  WITH CONTRAST  IMPRESSION: 1. Proctocolitis involving the mid and distal sigmoid colon and upper rectum with a 3.9 cm gas containing interloop abscess centered in the sigmoid mesocolon. 2. Severe fecal burden.  No findings of bowel obstruction. 3. An enlarged left common iliac lymph node is likely reactive. 4. A 2.7 cm hemangioma is noted in the right hepatic lobe. 5. Mild focal bronchiolitis in the right lower lobe.   02/07/2021 Imaging   CLINICAL DATA:  Abdominal pain, cramping, constipation   EXAM: CT ABDOMEN AND PELVIS WITH CONTRAST  IMPRESSION: 1. There is severe, circumferential wall thickening and mucosal hyperenhancement involving the distal sigmoid colon and rectum to the anus, generally tethered appearance unchanged, involving at least 15-20 cm of distal sigmoid and rectum. 2. There is again seen an abscess at the medial aspect of the rectosigmoid junction measuring approximately 3.0 x 1.6 cm. 3. Findings are generally consistent with nonspecific infectious, inflammatory, or ischemic colitis, including inflammatory bowel disease such as Crohn's disease or ulcerative colitis; however an underlying malignancy is very difficult to exclude, particularly given substantial wall thickening. 4. Large burden of stool throughout the extremely redundant proximal colon, likely reflecting some degree of obstipation. 5. Enlarged left external iliac lymph nodes measuring up to 1.1 x 0.9 cm fall, nonspecific, possibly reactive, however nodal metastatic disease is not excluded per above.   02/09/2021 Initial Biopsy   FINAL MICROSCOPIC DIAGNOSIS:   A. COLON MASS, SIGMOID, BIOPSY:  - Adenomatous lesion with extensive high-grade dysplasia, see comment   B. RECTAL, COLON, SIGMOID, BIOPSY:  - Mild acute/ subacute nonspecific colitis, see comment  - Negative for definite features of chronicity, granulomas or dysplasia   COMMENT:  A.  Evidence of invasive carcinoma is not seen in the  submitted biopsies but the biopsy fragments appear superficial and a more severe deeper process cannot be ruled out.  B.  Histologic features in the rectosigmoid biopsies are not compatible with untreated inflammatory bowel disease.  Differential diagnosis can include infection, drug-effect and stercoral proctitis among other possibilities.    02/09/2021 Procedure   Colonoscopy, Dr. Levora Angel  Impression: - Preparation of the colon was fair. - Likely malignant partially obstructing tumor in the distal sigmoid colon. Biopsied. Tattooed. - Congested, inflamed and thickened folds of the mucosa in the recto-sigmoid colon. Biopsied. Tattooed.   02/10/2021 Cancer Staging   Staging form: Colon and Rectum, AJCC 8th Edition - Pathologic stage from 02/10/2021: Stage IIB (pT4a, pN0, cM0) - Signed by Malachy Mood, MD on 02/28/2021 Stage prefix: Initial diagnosis Total positive nodes: 0 Histologic grading system: 4 grade system Histologic grade (G): G2 Residual tumor (R): R0 - None   02/10/2021 Definitive Surgery   FINAL MICROSCOPIC DIAGNOSIS:   A. COLON, SIGMOID:  - Invasive moderately differentiated adenocarcinoma.  - Twenty-seven lymph nodes, negative for metastatic carcinoma (0/27).  - See oncology table below.   B. COLON, DISTAL, SIGMOID:  - Segment of colon with serosal adhesions, acute inflammation, and tattoo pigment.  - Negative for malignancy.    02/11/2021 Initial Diagnosis   Cancer of sigmoid colon (HCC)   03/14/2021 - 05/23/2021 Chemotherapy   Patient is on Treatment Plan : COLORECTAL Xelox (Capeox) q21d     04/20/2021 Genetic Testing   Negative hereditary cancer genetic testing: no pathogenic variants  detected in [place] CustomNext-Cancer +RNAinsight Panel.  Report date is April 20, 2021.   The CustomNext-Cancer+RNAinsight panel offered by [employer] includes sequencing and rearrangement analysis for the following 47 genes:  APC, ATM, AXIN2, BARD1, BMPR1A, BRCA1, BRCA2, BRIP1, CDH1,  CDK4, CDKN2A, CHEK2, DICER1, EPCAM, GREM1, HOXB13, MEN1, MLH1, MSH2, MSH3, MSH6, MUTYH, NBN, NF1, NF2, NTHL1, PALB2, PMS2, POLD1, POLE, PTEN, RAD51C, RAD51D, RECQL, RET, SDHA, SDHAF2, SDHB, SDHC, SDHD, SMAD4, SMARCA4, STK11, TP53, TSC1, TSC2, and VHL.  RNA data is routinely analyzed for use in variant interpretation for all genes.    02/20/2022 Imaging    IMPRESSION: 1. Similar postsurgical changes of partial sigmoidectomy with Hartmann's pouch formation and left anterior abdominal wall colostomy. No noncontrast CT evidence of local recurrence. New nonobstructive bowel/fluid containing peristomal hernia. 2. No convincing evidence of metastatic disease within the chest, abdomen, or pelvis on this noncontrast enhanced examination. 3. New multifocal subpleural foci of nodularity correspond with regions of atelectasis seen on prior imaging, favored to reflect subsegmental atelectasis. Stable 3 mm right lower lobe pulmonary nodule, favored benign. Consider attention on dedicated short-term interval follow-up chest CT. 4. Stable soft tissue along the left iliac vessels again favored to reflect left ovary given its continuity with the gonadal vessels but difficult to evaluate given lack of intravenous contrast material. Correlation with CEA levels and continued attention on follow-up imaging suggested. 5. Moderate volume of formed stool throughout the colon. Correlate for constipation.      INTERVAL HISTORY:  Barbara Valdez was contacted for a follow up of  sigmoid colon cancer  . She was last seen by me on 08/24/2022.    All other systems were reviewed with the patient and are negative.  MEDICAL HISTORY:  Past Medical History:  Diagnosis Date   Anemia    Anxiety    Colon cancer (HCC) 01/2021   stage 2   Complication of anesthesia    took a long time to wake up after surgery   Family history of breast cancer 03/30/2021   Family history of colon cancer 03/30/2021    SURGICAL  HISTORY: Past Surgical History:  Procedure Laterality Date   ABDOMINAL HYSTERECTOMY Bilateral 09/26/2016   Procedure: HYSTERECTOMY ABDOMINAL W/ BILATERAL SALPINGECTOMY;  Surgeon: Allie Bossier, MD;  Location: [facility];  Service: Gynecology;  Laterality: Bilateral;   BIOPSY  02/09/2021   Procedure: BIOPSY;  Surgeon: Kathi Der, MD;  Location: [facility];  Service: Gastroenterology;;   BREAST BIOPSY Left    COLONOSCOPY N/A 02/09/2021   Procedure: COLONOSCOPY;  Surgeon: Kathi Der, MD;  Location: [facility];  Service: Gastroenterology;  Laterality: N/A;   LAPAROSCOPIC PARTIAL COLECTOMY N/A 02/10/2021   Procedure: ROBOTIC ASSISTED PARTIAL COLECTOMY WITH COLOSTOMY;  Surgeon: Luretha Murphy, MD;  Location: [facility];  Service: General;  Laterality: N/A;   SUBMUCOSAL TATTOO INJECTION  02/09/2021   Procedure: SUBMUCOSAL TATTOO INJECTION;  Surgeon: Kathi Der, MD;  Location: [facility];  Service: Gastroenterology;;   TUBAL LIGATION      I have reviewed the social history and family history with the patient and they are unchanged from previous note.  ALLERGIES:  is allergic to ferrlecit [na ferric gluc cplx in sucrose].  MEDICATIONS:  Current Outpatient Medications  Medication Sig Dispense Refill   acetaminophen (TYLENOL) 500 MG tablet Take 2 tablets (1,000 mg total) by mouth every 6 (six) hours as needed for mild pain. 30 tablet 0   metroNIDAZOLE (FLAGYL) 500 MG tablet Take 1 tablet (500 mg total) by mouth 2 (two)  times daily. 14 tablet 0   ondansetron (ZOFRAN) 8 MG tablet Take 1 tablet (8 mg total) by mouth 2 (two) times daily as needed for refractory nausea / vomiting. Start on day 3 after chemotherapy. 30 tablet 1   pantoprazole (PROTONIX) 40 MG tablet Take 40 mg by mouth daily as needed.     polyethylene glycol (MIRALAX / GLYCOLAX) 17 g packet Take 17 g by mouth daily. 14 each 0   No current facility-administered medications for this visit.    PHYSICAL EXAMINATION: ECOG  PERFORMANCE STATUS: 0 - Asymptomatic  There were no vitals filed for this visit. Wt Readings from Last 3 Encounters:  09/05/22 153 lb (69.4 kg)  08/24/22 155 lb 14.4 oz (70.7 kg)  08/16/22 157 lb (71.2 kg)     No vitals taken today, Exam not performed today  LABORATORY DATA:  I have reviewed the data as listed    Latest Ref Rng & Units 08/09/2022    8:46 AM 07/25/2022    8:18 AM 07/11/2022    8:17 AM  CBC  WBC 3.4 - 10.8 x10E3/uL 4.5  4.7  4.8   Hemoglobin 11.1 - 15.9 g/dL 11.9  14.7  82.9   Hematocrit 34.0 - 46.6 % 35.7  37.8  38.4   Platelets 150 - 450 x10E3/uL 250  246  262         Latest Ref Rng & Units 08/09/2022    8:46 AM 07/25/2022    8:18 AM 07/11/2022    8:17 AM  CMP  Glucose 70 - 99 mg/dL 87  86  94   BUN 6 - 24 mg/dL 14  9  9    Creatinine 0.57 - 1.00 mg/dL 5.62  1.30  8.65   Sodium 134 - 144 mmol/L 144  143  142   Potassium 3.5 - 5.2 mmol/L 4.3  4.0  3.8   Chloride 96 - 106 mmol/L 104  108  107   CO2 20 - 29 mmol/L 24  28  28    Calcium 8.7 - 10.2 mg/dL 78.4  9.2  9.9   Total Protein 6.0 - 8.5 g/dL 7.8  7.6  7.7   Total Bilirubin 0.0 - 1.2 mg/dL 0.3  0.4  0.5   Alkaline Phos 44 - 121 IU/L 75  66  73   AST 0 - 40 IU/L 22  19  21    ALT 0 - 32 IU/L 9  7  11        RADIOGRAPHIC STUDIES: I have personally reviewed the radiological images as listed and agreed with the findings in the report. NM PET Image Initial (PI) Skull Base To Thigh  Result Date: 09/05/2022 CLINICAL DATA:  Subsequent treatment strategy for colorectal carcinoma. EXAM: NUCLEAR MEDICINE PET SKULL BASE TO THIGH TECHNIQUE: 7.6 mCi F-18 FDG was injected intravenously. Full-ring PET imaging was performed from the skull base to thigh after the radiotracer. CT data was obtained and used for attenuation correction and anatomic localization. Fasting blood glucose: 79 mg/dl COMPARISON:  None Available. FINDINGS: Mediastinal blood pool activity: SUV max 2.1 Liver activity: SUV max NA NECK: No hypermetabolic  lymph nodes in the neck. Incidental CT findings: None. CHEST: No hypermetabolic mediastinal or hilar nodes. No suspicious pulmonary nodules on the CT scan. Incidental CT findings: None. ABDOMEN/PELVIS: Enlarging mass within the RIGHT pelvis measures 4.6 x 4.6 cm (image 161/CT series 4). This LEFT pelvic mass is intensely hypermetabolic with SUV max equal 28 on image 162 Post partial LEFT colectomy. Hartmann's  pouch noted . No abnormal metabolic activity associated with the rectum. No hypermetabolic abdominal or mesenteric lymph nodes. No abnormal metabolic activity liver. Incidental CT findings: None. SKELETON: No focal hypermetabolic activity to suggest skeletal metastasis. Incidental CT findings: None. IMPRESSION: 1. Intensely hypermetabolic mass in the LEFT pelvis is consistent with metastatic lymphadenopathy or local recurrence of colorectal carcinoma. 2. No additional evidence of metastatic disease or nodal disease. 3. Postsurgical change consistent partial colectomy and LEFT colostomy. Electronically Signed   By: Genevive Bi M.D.   On: 09/05/2022 16:56      Orders Placed This Encounter  Procedures   Ambulatory referral to Hematology / Oncology    Referral Priority:   Urgent    Referral Type:   Consultation    Referral Reason:   Specialty Services Required    Requested Specialty:   Gynecologic Oncology    Number of Visits Requested:   1   All questions were answered. The patient knows to call the clinic with any problems, questions or concerns. No barriers to learning was detected. The total time spent in the appointment was 30 minutes including care coordination and >50% on counseling.     Malachy Mood, MD 09/07/2022   Carolin Coy am acting as scribe for Malachy Mood, MD.   I have reviewed the above documentation for accuracy and completeness, and I agree with the above.

## 2022-09-07 NOTE — Telephone Encounter (Signed)
Olegario Shearer from [facility] at [facility] calling on behalf of Malachy Mood, MD.  Dr. [name] would like to speak with Dr. [name] about this pt. They are aware Dr. [name] is not back in the office until Monday.  Please have Dr. [name] call Dr. [name] on her cell phone at [phone] on Monday.

## 2022-09-07 NOTE — Assessment & Plan Note (Signed)
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 [name]. 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 Margins and lymph nodes negative. -she received adjuvant CAPOX 03/14/21 - 05/23/21 to reduce her risk of recurrence. she had additional Xeloda alone to complete 6 months of treatment.  -surveillance colonoscopy on 06/21/21 with Dr. Leonides Schanz was unremarkable. Plan for repeat in 1 year. -restaging CT AP on 08/11/21 showed: a soft tissue nodule favored to be related to left ovary, but adenopathy not completely excluded. -She is clinically doing well, no concern for recurrence. -She had surveillance CT scan 02/21/2022 showed no definitive evidence of recurrence, stable soft tissue nodule in the left pelvis, felt to be left ovary.  CT scan also showed a few small subpleural nodule in her lungs, will follow-up closely. -Repeated CT chest from Jun 09, 2022 showed a stable tiny lung nodules, likely benign. -Due to rising CEA, we repeated CT on 08/17/2022 which showed enlarging rounded lesion along the LEFT iliac vessels, now measuring 4.2cm, concerning for cancer recurrence. -I reviewed her PET scan findings, which showed Intensely hypermetabolic mass in the LEFT pelvis is consistent with metastatic lymphadenopathy or local recurrence of colorectal carcinoma. -pt has seen her GYN Dr. [name] on 8/13, will discuss with her about surgery

## 2022-09-07 NOTE — Telephone Encounter (Signed)
LVM on Dr. Hollie Salk Dove's office clinical line requesting if Dr. [name] would please give Dr. [name] a call regarding this mutual pt.  Dr. [name] will be out of the office until Monday 09/11/2022.  Left Dr. Latanya Maudlin cellphone number for Dr. [name] to call along with Dr. Latanya Maudlin office number.

## 2022-09-07 NOTE — Addendum Note (Signed)
Addended by: Cornelius Moras D on: 09/07/2022 10:00 AM   Modules accepted: Orders

## 2022-09-08 ENCOUNTER — Telehealth: Payer: Self-pay

## 2022-09-08 ENCOUNTER — Other Ambulatory Visit: Payer: Self-pay

## 2022-09-08 NOTE — Telephone Encounter (Signed)
Confirmed that referral is for gyn onc [place] and Dr. [name] has entered referral. Referral for [facility] closed.

## 2022-09-11 ENCOUNTER — Other Ambulatory Visit: Payer: Self-pay

## 2022-09-11 DIAGNOSIS — C187 Malignant neoplasm of sigmoid colon: Secondary | ICD-10-CM

## 2022-09-11 NOTE — Progress Notes (Signed)
Verbal order w/readback given by Dr. [name] for referral to Surgical Oncology Dr. Milderd Meager at [facility].  Referral packet faxed to Dr. Dustin Folks office [phone]  [phone]).  Fax confirmation received.  Drusilla Kanner in Radiology will push images of diagnostic test for 1 year to [facility].

## 2022-09-13 ENCOUNTER — Telehealth: Payer: Self-pay

## 2022-09-13 ENCOUNTER — Other Ambulatory Visit: Payer: Self-pay

## 2022-09-13 NOTE — Telephone Encounter (Signed)
Spoke w/pt via telephone regarding referral to [facility].  Pt stated she has been contacted by Dr. Dustin Folks office and is scheduled on 09/21/2022.  Notified Dr. [name] of the conversation w/pt.

## 2022-09-21 ENCOUNTER — Telehealth: Payer: Self-pay | Admitting: Radiation Oncology

## 2022-09-21 ENCOUNTER — Other Ambulatory Visit: Payer: Self-pay

## 2022-09-21 NOTE — Telephone Encounter (Signed)
Unable to LVM to schedule CON with Dr. Lisbeth Renshaw

## 2022-09-22 ENCOUNTER — Other Ambulatory Visit: Payer: Self-pay

## 2022-09-22 DIAGNOSIS — C187 Malignant neoplasm of sigmoid colon: Secondary | ICD-10-CM

## 2022-09-22 NOTE — Progress Notes (Incomplete)
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  Barbara Valdez presented for repeat CT scan due to rising CEA.  PET 09/05/2022: Intensely hypermetabolic mass in the LEFT pelvis is consistent with metastatic lymphadenopathy or local recurrence of colorectal carcinoma.  No additional evidence of metastatic disease or nodal disease.  Postsurgical change consistent partial colectomy and LEFT colostomy.  CT CAP 08/17/2022:  Enlarging rounded lesion along the LEFT iliac vessels is most consistent with METASTATIC ADENOPATHY OR COLORECTAL carcinoma LOCAL RECURRENCE. Favor local recurrence.  Sigmoid colon resection with LEFT colon end ostomy Stable benign hemangioma in the LEFT hepatic lobe.   Biopsies of Colon Mass 02/10/2021   Past/Anticipated interventions by surgeon, if any:  Dr. [name] -Partial Colectomy with colostomy 02/10/2021  Past/Anticipated interventions by medical oncology, if any:  Dr. [name] 09/07/2022 -Due to rising CEA, we repeated CT on 08/17/2022 which showed enlarging rounded lesion along the LEFT iliac vessels, now measuring 4.2cm, concerning for cancer recurrence. -I reviewed her PET scan findings, which showed Intensely hypermetabolic mass in the LEFT pelvis is consistent with metastatic lymphadenopathy or local recurrence of colorectal carcinoma. -pt has seen her GYN Dr. [name] on 8/13, pelvic ultrasound showed a 5.3 cm mass in the left pelvis, suggesting metastatic lymphadenopathy or local recurrence of colon cancer, left ovary could not be seen by ultrasound.  I spoke with Dr. [name] today, and she recommend GYN oncologist referral. -I will reach out to our GYN oncologist Dr. [name] and colorectal surgeons, for surgical resection, given the isolated recurrence. -I plan to see her back after surgery to discuss adjuvant chemo vs observation.    Weight changes, if any: {:18581}  Bowel/Bladder complaints, if any: {:18581}  Nausea / Vomiting, if any:  {:18581}  Pain issues, if any:  {:18581}  Any blood per rectum:   {:18581}  SAFETY ISSUES: Prior radiation? {:18581} Pacemaker/ICD? {:18581} Possible current pregnancy? Hysterectomy Is the patient on methotrexate? {:18581}  Current Complaints/Details: Genetics 04/20/2021- Negative hereditary cancer genetic testing.

## 2022-09-23 ENCOUNTER — Telehealth: Payer: Self-pay | Admitting: Hematology

## 2022-09-25 NOTE — Assessment & Plan Note (Signed)
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 [name]. 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 Margins and lymph nodes negative. -she received adjuvant CAPOX 03/14/21 - 05/23/21 to reduce her risk of recurrence. she had additional Xeloda alone to complete 6 months of treatment.  -surveillance colonoscopy on 06/21/21 with Dr. Leonides Schanz was unremarkable. Plan for repeat in 1 year. -restaging CT AP on 08/11/21 showed: a soft tissue nodule favored to be related to left ovary, but adenopathy not completely excluded. -She is clinically doing well, no concern for recurrence. -She had surveillance CT scan 02/21/2022 showed no definitive evidence of recurrence, stable soft tissue nodule in the left pelvis, felt to be left ovary.  CT scan also showed a few small subpleural nodule in her lungs, will follow-up closely. -Repeated CT chest from Jun 09, 2022 showed a stable tiny lung nodules, likely benign. -Due to rising CEA, we repeated CT on 08/17/2022 which showed enlarging rounded lesion along the LEFT iliac vessels, now measuring 4.2cm, concerning for cancer recurrence. -PET from 09/05/22 showed Intensely hypermetabolic mass in the LEFT pelvis is consistent with metastatic lymphadenopathy or local recurrence of colorectal carcinoma. -pt was referred to Dr. Glennis Brink at [facility] and she recommended neoadjuvant RT, I recommend with concurrent Xeloda. She agrees to proceed.

## 2022-09-26 ENCOUNTER — Telehealth: Payer: Self-pay | Admitting: Pharmacy Technician

## 2022-09-26 ENCOUNTER — Other Ambulatory Visit (HOSPITAL_COMMUNITY): Payer: Self-pay

## 2022-09-26 ENCOUNTER — Telehealth: Payer: Self-pay | Admitting: Pharmacist

## 2022-09-26 ENCOUNTER — Inpatient Hospital Stay: Payer: Medicaid Other | Attending: Hematology | Admitting: Hematology

## 2022-09-26 ENCOUNTER — Encounter: Payer: Self-pay | Admitting: Radiation Oncology

## 2022-09-26 ENCOUNTER — Other Ambulatory Visit: Payer: Self-pay

## 2022-09-26 ENCOUNTER — Ambulatory Visit
Admission: RE | Admit: 2022-09-26 | Discharge: 2022-09-26 | Disposition: A | Discharge status: Home / Self Care | Payer: Medicaid Other | Source: Ambulatory Visit | Attending: Radiation Oncology | Admitting: Radiation Oncology

## 2022-09-26 ENCOUNTER — Inpatient Hospital Stay: Payer: Medicaid Other | Admitting: Licensed Clinical Social Worker

## 2022-09-26 ENCOUNTER — Ambulatory Visit: Payer: Medicaid Other | Admitting: Radiation Oncology

## 2022-09-26 VITALS — BP 137/125 | HR 80 | Temp 98.0°F | Resp 17 | Wt 152.9 lb

## 2022-09-26 VITALS — BP 118/82 | HR 77 | Temp 96.9°F | Resp 18 | Ht 66.0 in | Wt 152.2 lb

## 2022-09-26 DIAGNOSIS — C187 Malignant neoplasm of sigmoid colon: Secondary | ICD-10-CM

## 2022-09-26 DIAGNOSIS — Z933 Colostomy status: Secondary | ICD-10-CM | POA: Diagnosis not present

## 2022-09-26 DIAGNOSIS — Z79899 Other long term (current) drug therapy: Secondary | ICD-10-CM | POA: Diagnosis not present

## 2022-09-26 DIAGNOSIS — R970 Elevated carcinoembryonic antigen [CEA]: Secondary | ICD-10-CM | POA: Insufficient documentation

## 2022-09-26 DIAGNOSIS — Z803 Family history of malignant neoplasm of breast: Secondary | ICD-10-CM | POA: Diagnosis not present

## 2022-09-26 DIAGNOSIS — Z800 Family history of malignant neoplasm of digestive organs: Secondary | ICD-10-CM | POA: Diagnosis not present

## 2022-09-26 MED ORDER — CAPECITABINE 500 MG PO TABS
825.0000 mg/m2 | ORAL_TABLET | Freq: Two times a day (BID) | ORAL | 1 refills | Status: DC
  Filled 2022-09-26: qty 90, 15d supply, fill #0
  Filled 2022-09-27: qty 90, 21d supply, fill #0
  Filled 2022-10-17: qty 90, 21d supply, fill #1

## 2022-09-26 NOTE — Progress Notes (Signed)
[facility] CSW Progress Note  Visual merchandiser  received a referral to call pt regarding financial concerns.  Pt reports she will be undergoing radiation at [facility], but will be having chemotherapy and surgery at [facility].  Pt reports she had applied for social security disability when she was first diagnosed a year ago, but was denied.  CSW encouraged pt to allow for a referral to be sent on her behalf to [facility] to reapply as pt now is experiencing a recurrence and will need additional treatment.  Pt will sign referral on Friday.  Pt also has money remaining from her [employer] which she did not use completely when she was first treated.  CSW pt w/ contact information for financial resource specialist to inquire as to the amount left from the grant.  CSW also provided pt w/ contact details for case worker at the [facility] for DSS to also apply for SSI.  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  Pt has since moved in with her son who is assist her financially.  CSW to continue to provided support throughout duration of treatment as appropriate.        Rachel Moulds, LCSW Clinical Social Worker [facility]    Patient is participating in a Managed Medicaid Plan:  Yes

## 2022-09-26 NOTE — Progress Notes (Signed)
[facility]   Telephone:([phone] Fax:([phone]) [phone]   Clinic Follow up Note   Patient Care Team: Caudle, Shelton Silvas, FNP as PCP - General (Family Medicine) Luretha Murphy, MD as Consulting Physician (General Surgery) Kathi Der, MD as Consulting Physician (Gastroenterology) Malachy Mood, MD as Consulting Physician (Hematology) Allie Bossier, MD as Consulting Physician (Obstetrics and Gynecology)  Date of Service:  09/26/2022  CHIEF COMPLAINT: f/u of sigmoid colon cancer      CURRENT THERAPY:  neoadjuvant RT w/ Concurrent Xeloda starting 9/16  ASSESSMENT:  Barbara Valdez is a 47 y.o. 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 [name]. 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 Margins and lymph nodes negative. -she received adjuvant CAPOX 03/14/21 - 05/23/21 to reduce her risk of recurrence. she had additional Xeloda alone to complete 6 months of treatment.  -surveillance colonoscopy on 06/21/21 with Dr. Leonides Schanz was unremarkable. Plan for repeat in 1 year. -restaging CT AP on 08/11/21 showed: a soft tissue nodule favored to be related to left ovary, but adenopathy not completely excluded. -She is clinically doing well, no concern for recurrence. -She had surveillance CT scan 02/21/2022 showed no definitive evidence of recurrence, stable soft tissue nodule in the left pelvis, felt to be left ovary.  CT scan also showed a few small subpleural nodule in her lungs, will follow-up closely. -Repeated CT chest from Jun 09, 2022 showed a stable tiny lung nodules, likely benign. -Due to rising CEA, we repeated CT on 08/17/2022 which showed enlarging rounded lesion along the LEFT iliac vessels, now measuring 4.2cm, concerning for cancer recurrence. -PET from 09/05/22 showed Intensely hypermetabolic mass in the LEFT pelvis  is consistent with metastatic lymphadenopathy or local recurrence of colorectal carcinoma. -pt was referred to Dr. Glennis Brink at [facility] and she recommended neoadjuvant RT, I recommend with concurrent Xeloda. She agrees to proceed.      PLAN: -Pt agree to Concurrent neoadjuvant Xeloda and radiation  -Xeloda  Mon-Fr  while taking radiation, no weekends, called in today  - lab and f/u on 9/16, first day of treatment, then every 2 weeks   SUMMARY OF ONCOLOGIC HISTORY: Oncology History Overview Note   Cancer Staging  Cancer of sigmoid colon [facility]) Staging form: Colon and Rectum, AJCC 8th Edition - Pathologic stage from 02/10/2021: Stage IIB (pT4a, pN0, cM0) - Signed by Malachy Mood, MD on 02/28/2021    Cancer of sigmoid colon (HCC)  11/27/2020 Imaging   CLINICAL DATA:  Abdominal pain that is began Monday. Patient reports seen scant amounts of dark blood in stool. Feels bloated.   EXAM: CT ABDOMEN AND PELVIS WITH CONTRAST  IMPRESSION: 1. Proctocolitis involving the mid and distal sigmoid colon and upper rectum with a 3.9 cm gas containing interloop abscess centered in the sigmoid mesocolon. 2. Severe fecal burden.  No findings of bowel obstruction. 3. An enlarged left common iliac lymph node is likely reactive. 4. A 2.7 cm hemangioma is noted in the right hepatic lobe. 5. Mild focal bronchiolitis in the right lower lobe.   02/07/2021 Imaging   CLINICAL DATA:  Abdominal pain, cramping, constipation   EXAM: CT ABDOMEN AND PELVIS WITH CONTRAST  IMPRESSION: 1. There is severe, circumferential wall thickening and mucosal hyperenhancement involving the distal sigmoid colon and rectum to the anus, generally tethered appearance unchanged, involving at least 15-20 cm of distal sigmoid and rectum.  2. There is again seen an abscess at the medial aspect of the rectosigmoid junction measuring approximately 3.0 x 1.6 cm. 3. Findings are generally consistent with nonspecific infectious, inflammatory,  or ischemic colitis, including inflammatory bowel disease such as Crohn's disease or ulcerative colitis; however an underlying malignancy is very difficult to exclude, particularly given substantial wall thickening. 4. Large burden of stool throughout the extremely redundant proximal colon, likely reflecting some degree of obstipation. 5. Enlarged left external iliac lymph nodes measuring up to 1.1 x 0.9 cm fall, nonspecific, possibly reactive, however nodal metastatic disease is not excluded per above.   02/09/2021 Initial Biopsy   FINAL MICROSCOPIC DIAGNOSIS:   A. COLON MASS, SIGMOID, BIOPSY:  - Adenomatous lesion with extensive high-grade dysplasia, see comment   B. RECTAL, COLON, SIGMOID, BIOPSY:  - Mild acute/ subacute nonspecific colitis, see comment  - Negative for definite features of chronicity, granulomas or dysplasia   COMMENT:  A.  Evidence of invasive carcinoma is not seen in the submitted biopsies but the biopsy fragments appear superficial and a more severe deeper process cannot be ruled out.  B.  Histologic features in the rectosigmoid biopsies are not compatible with untreated inflammatory bowel disease.  Differential diagnosis can include infection, drug-effect and stercoral proctitis among other possibilities.    02/09/2021 Procedure   Colonoscopy, Dr. Levora Angel  Impression: - Preparation of the colon was fair. - Likely malignant partially obstructing tumor in the distal sigmoid colon. Biopsied. Tattooed. - Congested, inflamed and thickened folds of the mucosa in the recto-sigmoid colon. Biopsied. Tattooed.   02/10/2021 Cancer Staging   Staging form: Colon and Rectum, AJCC 8th Edition - Pathologic stage from 02/10/2021: Stage IIB (pT4a, pN0, cM0) - Signed by Malachy Mood, MD on 02/28/2021 Stage prefix: Initial diagnosis Total positive nodes: 0 Histologic grading system: 4 grade system Histologic grade (G): G2 Residual tumor (R): R0 - None   02/10/2021 Definitive  Surgery   FINAL MICROSCOPIC DIAGNOSIS:   A. COLON, SIGMOID:  - Invasive moderately differentiated adenocarcinoma.  - Twenty-seven lymph nodes, negative for metastatic carcinoma (0/27).  - See oncology table below.   B. COLON, DISTAL, SIGMOID:  - Segment of colon with serosal adhesions, acute inflammation, and tattoo pigment.  - Negative for malignancy.    02/11/2021 Initial Diagnosis   Cancer of sigmoid colon (HCC)   03/14/2021 - 05/23/2021 Chemotherapy   Patient is on Treatment Plan : COLORECTAL Xelox (Capeox) q21d     04/20/2021 Genetic Testing   Negative hereditary cancer genetic testing: no pathogenic variants detected in [facility] CustomNext-Cancer +RNAinsight Panel.  Report date is April 20, 2021.   The CustomNext-Cancer+RNAinsight panel offered by [employer] includes sequencing and rearrangement analysis for the following 47 genes:  APC, ATM, AXIN2, BARD1, BMPR1A, BRCA1, BRCA2, BRIP1, CDH1, CDK4, CDKN2A, CHEK2, DICER1, EPCAM, GREM1, HOXB13, MEN1, MLH1, MSH2, MSH3, MSH6, MUTYH, NBN, NF1, NF2, NTHL1, PALB2, PMS2, POLD1, POLE, PTEN, RAD51C, RAD51D, RECQL, RET, SDHA, SDHAF2, SDHB, SDHC, SDHD, SMAD4, SMARCA4, STK11, TP53, TSC1, TSC2, and VHL.  RNA data is routinely analyzed for use in variant interpretation for all genes.    02/20/2022 Imaging    IMPRESSION: 1. Similar postsurgical changes of partial sigmoidectomy with Hartmann's pouch formation and left anterior abdominal wall colostomy. No noncontrast CT evidence of local recurrence. New nonobstructive bowel/fluid containing peristomal hernia. 2. No convincing evidence of metastatic disease within the chest, abdomen, or pelvis on this noncontrast enhanced examination. 3. New multifocal subpleural foci of nodularity correspond with regions of atelectasis seen on  prior imaging, favored to reflect subsegmental atelectasis. Stable 3 mm right lower lobe pulmonary nodule, favored benign. Consider attention on dedicated  short-term interval follow-up chest CT. 4. Stable soft tissue along the left iliac vessels again favored to reflect left ovary given its continuity with the gonadal vessels but difficult to evaluate given lack of intravenous contrast material. Correlation with CEA levels and continued attention on follow-up imaging suggested. 5. Moderate volume of formed stool throughout the colon. Correlate for constipation.      INTERVAL HISTORY:  Barbara Valdez is here for a follow up of sigmoid colon cancer . She was last seen by me on 09/07/2022. She presents to the clinic alone. Pt state that she has been dealing with constipation.     All other systems were reviewed with the patient and are negative.  MEDICAL HISTORY:  Past Medical History:  Diagnosis Date   Anemia    Anxiety    Colon cancer (HCC) 01/2021   stage 2   Complication of anesthesia    took a long time to wake up after surgery   Family history of breast cancer 03/30/2021   Family history of colon cancer 03/30/2021    SURGICAL HISTORY: Past Surgical History:  Procedure Laterality Date   ABDOMINAL HYSTERECTOMY Bilateral 09/26/2016   Procedure: HYSTERECTOMY ABDOMINAL W/ BILATERAL SALPINGECTOMY;  Surgeon: Allie Bossier, MD;  Location: [facility];  Service: Gynecology;  Laterality: Bilateral;   BIOPSY  02/09/2021   Procedure: BIOPSY;  Surgeon: Kathi Der, MD;  Location: [facility];  Service: Gastroenterology;;   BREAST BIOPSY Left    COLONOSCOPY N/A 02/09/2021   Procedure: COLONOSCOPY;  Surgeon: Kathi Der, MD;  Location: [facility];  Service: Gastroenterology;  Laterality: N/A;   LAPAROSCOPIC PARTIAL COLECTOMY N/A 02/10/2021   Procedure: ROBOTIC ASSISTED PARTIAL COLECTOMY WITH COLOSTOMY;  Surgeon: Luretha Murphy, MD;  Location: [facility];  Service: General;  Laterality: N/A;   SUBMUCOSAL TATTOO INJECTION  02/09/2021   Procedure: SUBMUCOSAL TATTOO INJECTION;  Surgeon: Kathi Der, MD;  Location: [facility];   Service: Gastroenterology;;   TUBAL LIGATION      I have reviewed the social history and family history with the patient and they are unchanged from previous note.  ALLERGIES:  is allergic to ferrlecit [na ferric gluc cplx in sucrose].  MEDICATIONS:  Current Outpatient Medications  Medication Sig Dispense Refill   capecitabine (XELODA) 500 MG tablet Take 3 tablets (1,500 mg total) by mouth 2 (two) times daily after a meal. Take the days of radiation, Monday through Friday, off on weekends 90 tablet 1   acetaminophen (TYLENOL) 500 MG tablet Take 2 tablets (1,000 mg total) by mouth every 6 (six) hours as needed for mild pain. 30 tablet 0   metroNIDAZOLE (FLAGYL) 500 MG tablet Take 1 tablet (500 mg total) by mouth 2 (two) times daily. (Patient not taking: Reported on 09/26/2022) 14 tablet 0   ondansetron (ZOFRAN) 8 MG tablet Take 1 tablet (8 mg total) by mouth 2 (two) times daily as needed for refractory nausea / vomiting. Start on day 3 after chemotherapy. (Patient not taking: Reported on 09/26/2022) 30 tablet 1   pantoprazole (PROTONIX) 40 MG tablet Take 40 mg by mouth daily as needed. (Patient not taking: Reported on 09/26/2022)     polyethylene glycol (MIRALAX / GLYCOLAX) 17 g packet Take 17 g by mouth daily. (Patient not taking: Reported on 09/26/2022) 14 each 0   No current facility-administered medications for this visit.    PHYSICAL EXAMINATION: ECOG PERFORMANCE  STATUS: 0 - Asymptomatic  Vitals:   09/26/22 1006  BP: (!) 137/125  Pulse: 80  Resp: 17  Temp: 98 F (36.7 C)  SpO2: 100%   Wt Readings from Last 3 Encounters:  09/26/22 152 lb 3.2 oz (69 kg)  09/26/22 152 lb 14.4 oz (69.4 kg)  09/05/22 153 lb (69.4 kg)     GENERAL:alert, no distress and comfortable SKIN: skin color normal, no rashes or significant lesions EYES: normal, Conjunctiva are pink and non-injected, sclera clear  NEURO: alert & oriented x 3 with fluent speech   LABORATORY DATA:  I have reviewed the data as  listed    Latest Ref Rng & Units 08/09/2022    8:46 AM 07/25/2022    8:18 AM 07/11/2022    8:17 AM  CBC  WBC 3.4 - 10.8 x10E3/uL 4.5  4.7  4.8   Hemoglobin 11.1 - 15.9 g/dL 82.9  56.2  13.0   Hematocrit 34.0 - 46.6 % 35.7  37.8  38.4   Platelets 150 - 450 x10E3/uL 250  246  262         Latest Ref Rng & Units 08/09/2022    8:46 AM 07/25/2022    8:18 AM 07/11/2022    8:17 AM  CMP  Glucose 70 - 99 mg/dL 87  86  94   BUN 6 - 24 mg/dL 14  9  9    Creatinine 0.57 - 1.00 mg/dL 8.65  7.84  6.96   Sodium 134 - 144 mmol/L 144  143  142   Potassium 3.5 - 5.2 mmol/L 4.3  4.0  3.8   Chloride 96 - 106 mmol/L 104  108  107   CO2 20 - 29 mmol/L 24  28  28    Calcium 8.7 - 10.2 mg/dL 29.5  9.2  9.9   Total Protein 6.0 - 8.5 g/dL 7.8  7.6  7.7   Total Bilirubin 0.0 - 1.2 mg/dL 0.3  0.4  0.5   Alkaline Phos 44 - 121 IU/L 75  66  73   AST 0 - 40 IU/L 22  19  21    ALT 0 - 32 IU/L 9  7  11        RADIOGRAPHIC STUDIES: I have personally reviewed the radiological images as listed and agreed with the findings in the report. No results found.    No orders of the defined types were placed in this encounter.  All questions were answered. The patient knows to call the clinic with any problems, questions or concerns. No barriers to learning was detected. The total time spent in the appointment was 25 minutes.     Malachy Mood, MD 09/26/2022   Carolin Coy, CMA, am acting as scribe for Malachy Mood, MD.   I have reviewed the above documentation for accuracy and completeness, and I agree with the above.

## 2022-09-26 NOTE — Telephone Encounter (Signed)
Oral Oncology Pharmacist Encounter  Received new prescription for Xeloda (capecitabine) for the treatment of colon cancer in conjunction with radiation, planned for duration of radiation (~5 1/2 weeks).  Last available labs are from 08/09/22 - CBC w/ Diff and CMP assessed, no relevant lab abnormalities requiring baseline dose adjustment required at this time. Prescription dose and frequency assessed for appropriateness.  Current medication list in Epic reviewed, DDIs with Xeloda identified: Category C DDI between Xeloda and Pantoprazole - proton-pump inhibitors can decrease efficacy of Xeloda - will discuss with patient alternatives to pantoprazole, such as H2RA's like famotidine while on Xeloda. Category C DDI between Xeloda and Ondansetron due to risk of Qtc prolongation with fluorouracil products. Noted patient only taking PRN and PO route, risk higher with IV administration. No change in therapy warranted at this time.  Category C drug-drug interaction between Xeloda and Metronidazole due to risk of Qtc prolongation - patient reported not taking this on 09/26/22 at [facility], will confirm and update medication list.   Evaluated chart and no patient barriers to medication adherence noted.   Patient agreement for treatment documented in MD note on 09/26/22.  Prescription has been e-scribed to the [facility] for benefits analysis and approval.  Oral Oncology Clinic will continue to follow for insurance authorization, copayment issues, initial counseling and start date.  Lenord Carbo, PharmD, BCPS, [facility] Hematology/Oncology Clinical Pharmacist [facility] Oral Chemotherapy Navigation Clinics [phone] 09/26/2022 12:03 PM

## 2022-09-26 NOTE — Telephone Encounter (Signed)
Oral Oncology Patient Advocate Encounter  Upon running a benefits check for patient, [facility] Medicaid shows as the only current insurance plan.  [employer] states that patient has other primary coverage. I have left a voicemail with patient's daughters, Jon Gills and Dow Adolph.  If patient does have primary coverage I will collect the billing information and process a PA if needed. If there is no other coverage patient will need to call Medicaid and have her file updated before a claim can be processed.  Jinger Neighbors, CPhT-Adv Oncology Pharmacy Patient Advocate [facility] Direct Number: [phone]  Fax: [phone]

## 2022-09-26 NOTE — Progress Notes (Addendum)
Radiation Oncology         [phone] ________________________________  Name: Barbara Valdez        MRN: [id number]  Date of Service: 09/26/2022 DOB: 03-19-1975  NW:GNFAOZ, Shelton Silvas, FNP  Stanford Breed, MD     REFERRING PHYSICIAN: Stanford Breed, MD; Dr. [name]   DIAGNOSIS: The encounter diagnosis was Cancer of sigmoid colon [facility]).   HISTORY OF PRESENT ILLNESS: Barbara Valdez is a 47 y.o. female seen at the request of Dr. 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  Patient was diagnosed with stage IIB, pT4aN0M0, adenocarcinoma of the sigmoid colon in 2023.  She presented with rectal bleeding and a mass in the sigmoid colon by CT, and a colonoscopy on 02/09/22 showed a partially obstructing mass in the sigmoid colon occupying two thirds of the lumen.  Biopsies showed at least high-grade dysplasia but could not rule out a more invasive diagnosis based on the superficial specimens they had.  The following day she underwent a emergent partial colectomy on 02/10/2021 with Dr. [name], pathology showed a 9.5 cm invasive moderately differentiated adenocarcinoma arising in the sigmoid colon.  Her margins were negative and 27 lymph nodes were also negative.  She received adjuvant CapeOx between February and May 2023 and completed an additional 6 months of oral Xeloda.  She had a surveillance colonoscopy in May 2023 which was negative.  A restaging CT scan on 08/11/2021 showed a soft tissue nodule in the left pelvis felt to be her left ovary but adenopathy could not be excluded.  CT imaging in January 2024 showed stable soft tissue in the left pelvis again felt to be her ovary.  A few small subpleural nodules were seen and are planned to be followed expectantly, due to a rising CEA another CT on 08/17/2022 showed a rounded lesion along the left iliac vessels now measuring 4.2 cm PET scan showed intense hypermetabolic activity in this left pelvic mass when performed on 09/05/2022  and measured the SUV of 28.  The lesion measured 4.6 cm in greatest dimension.  She also had a pelvic ultrasound on 09/05/2022 with her OB/GYN Dr. [name] which showed a mass in the left pelvis measuring 5.3 cm, the left ovary could not be seen by ultrasound.  She was seen by Dr. Glennis Brink at [facility] as well as Dr. Ernest Haber and radiation oncology, and the plan is for chemoradiation followed by surgical resection and intraoperative radiotherapy given that she lives closer to [facility] she is seen to have radiation locally.     PREVIOUS RADIATION THERAPY: No   PAST MEDICAL HISTORY:  Past Medical History:  Diagnosis Date   Anemia    Anxiety    Colon cancer (HCC) 01/2021   stage 2   Complication of anesthesia    took a long time to wake up after surgery   Family history of breast cancer 03/30/2021   Family history of colon cancer 03/30/2021       PAST SURGICAL HISTORY: Past Surgical History:  Procedure Laterality Date   ABDOMINAL HYSTERECTOMY Bilateral 09/26/2016   Procedure: HYSTERECTOMY ABDOMINAL W/ BILATERAL SALPINGECTOMY;  Surgeon: Allie Bossier, MD;  Location: [facility];  Service: Gynecology;  Laterality: Bilateral;   BIOPSY  02/09/2021   Procedure: BIOPSY;  Surgeon: Kathi Der, MD;  Location: [facility];  Service: Gastroenterology;;   BREAST BIOPSY Left    COLONOSCOPY N/A 02/09/2021   Procedure: COLONOSCOPY;  Surgeon: Kathi Der, MD;  Location: [facility];  Service: Gastroenterology;  Laterality: N/A;  LAPAROSCOPIC PARTIAL COLECTOMY N/A 02/10/2021   Procedure: ROBOTIC ASSISTED PARTIAL COLECTOMY WITH COLOSTOMY;  Surgeon: Luretha Murphy, MD;  Location: [facility];  Service: General;  Laterality: N/A;   SUBMUCOSAL TATTOO INJECTION  02/09/2021   Procedure: SUBMUCOSAL TATTOO INJECTION;  Surgeon: Kathi Der, MD;  Location: [facility];  Service: Gastroenterology;;   TUBAL LIGATION       FAMILY HISTORY:  Family History  Problem Relation Age of Onset   Hyperlipidemia  Father    Breast cancer Maternal Aunt    Lymphoma Maternal Aunt    Colon cancer Maternal Uncle 62   Lung cancer Maternal Uncle    Lymphoma Paternal Grandmother        d. 59   Breast cancer Cousin 3       maternal female cousin   Hypertension Other    Cancer Other        PGF's mother; d. early 29s; unknown type; mets     SOCIAL HISTORY:  reports that she has never smoked. She has never used smokeless tobacco. She reports that she does not drink alcohol and does not use drugs.  The patient is single.  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   ALLERGIES: Ferrlecit [na ferric gluc cplx in sucrose]   MEDICATIONS:  Current Outpatient Medications  Medication Sig Dispense Refill   acetaminophen (TYLENOL) 500 MG tablet Take 2 tablets (1,000 mg total) by mouth every 6 (six) hours as needed for mild pain. 30 tablet 0   metroNIDAZOLE (FLAGYL) 500 MG tablet Take 1 tablet (500 mg total) by mouth 2 (two) times daily. (Patient not taking: Reported on 09/26/2022) 14 tablet 0   ondansetron (ZOFRAN) 8 MG tablet Take 1 tablet (8 mg total) by mouth 2 (two) times daily as needed for refractory nausea / vomiting. Start on day 3 after chemotherapy. (Patient not taking: Reported on 09/26/2022) 30 tablet 1   pantoprazole (PROTONIX) 40 MG tablet Take 40 mg by mouth daily as needed. (Patient not taking: Reported on 09/26/2022)     polyethylene glycol (MIRALAX / GLYCOLAX) 17 g packet Take 17 g by mouth daily. (Patient not taking: Reported on 09/26/2022) 14 each 0   No current facility-administered medications for this encounter.     REVIEW OF SYSTEMS: On review of systems, the patient reports that she is doing well.  She states that she occasionally has hard stool per her ostomy.  She denies any abdominal pain.  She is not experiencing any difficulty with nausea or vomiting.  She states that she is unable to have comfortable intercourse since her hysterectomy. No other  complaints are verbalized.      PHYSICAL EXAM:  Wt Readings from Last 3 Encounters:  09/26/22 152 lb 3.2 oz (69 kg)  09/05/22 153 lb (69.4 kg)  08/24/22 155 lb 14.4 oz (70.7 kg)   Temp Readings from Last 3 Encounters:  09/26/22 (!) 96.9 F (36.1 C)  08/24/22 98 F (36.7 C) (Oral)  07/11/22 98.8 F (37.1 C) (Oral)   BP Readings from Last 3 Encounters:  09/26/22 118/82  09/05/22 104/68  08/24/22 119/78   Pulse Readings from Last 3 Encounters:  09/26/22 77  09/05/22 74  08/24/22 79   Pain Assessment Pain Score: 0-No pain/10  In general this is a well appearing African American female in no acute distress. She's alert and oriented x4 and appropriate throughout the examination. Cardiopulmonary assessment is negative for acute distress and she exhibits normal effort.  ECOG = 0  0 - Asymptomatic (Fully active, able to carry on all predisease activities without restriction)  1 - Symptomatic but completely ambulatory (Restricted in physically strenuous activity but ambulatory and able to carry out work of a light or sedentary nature. For example, light housework, office work)  2 - Symptomatic, <50% in bed during the day (Ambulatory and capable of all self care but unable to carry out any work activities. Up and about more than 50% of waking hours)  3 - Symptomatic, >50% in bed, but not bedbound (Capable of only limited self-care, confined to bed or chair 50% or more of waking hours)  4 - Bedbound (Completely disabled. Cannot carry on any self-care. Totally confined to bed or chair)  5 - Death   Santiago Glad MM, Creech RH, Tormey DC, et al. [phone]). "Toxicity and response criteria of the [facility]". Am. Evlyn Clines. Oncol. 5 (6): 649-55    LABORATORY DATA:  Lab Results  Component Value Date   WBC 4.5 08/09/2022   HGB 11.3 08/09/2022   HCT 35.7 08/09/2022   MCV 79 08/09/2022   PLT 250 08/09/2022   Lab Results  Component Value Date   NA 144  08/09/2022   K 4.3 08/09/2022   CL 104 08/09/2022   CO2 24 08/09/2022   Lab Results  Component Value Date   ALT 9 08/09/2022   AST 22 08/09/2022   ALKPHOS 75 08/09/2022   BILITOT 0.3 08/09/2022      RADIOGRAPHY: NM PET Image Initial (PI) Skull Base To Thigh  Result Date: 09/05/2022 CLINICAL DATA:  Subsequent treatment strategy for colorectal carcinoma. EXAM: NUCLEAR MEDICINE PET SKULL BASE TO THIGH TECHNIQUE: 7.6 mCi F-18 FDG was injected intravenously. Full-ring PET imaging was performed from the skull base to thigh after the radiotracer. CT data was obtained and used for attenuation correction and anatomic localization. Fasting blood glucose: 79 mg/dl COMPARISON:  None Available. FINDINGS: Mediastinal blood pool activity: SUV max 2.1 Liver activity: SUV max NA NECK: No hypermetabolic lymph nodes in the neck. Incidental CT findings: None. CHEST: No hypermetabolic mediastinal or hilar nodes. No suspicious pulmonary nodules on the CT scan. Incidental CT findings: None. ABDOMEN/PELVIS: Enlarging mass within the RIGHT pelvis measures 4.6 x 4.6 cm (image 161/CT series 4). This LEFT pelvic mass is intensely hypermetabolic with SUV max equal 28 on image 162 Post partial LEFT colectomy. Hartmann's pouch noted . No abnormal metabolic activity associated with the rectum. No hypermetabolic abdominal or mesenteric lymph nodes. No abnormal metabolic activity liver. Incidental CT findings: None. SKELETON: No focal hypermetabolic activity to suggest skeletal metastasis. Incidental CT findings: None. IMPRESSION: 1. Intensely hypermetabolic mass in the LEFT pelvis is consistent with metastatic lymphadenopathy or local recurrence of colorectal carcinoma. 2. No additional evidence of metastatic disease or nodal disease. 3. Postsurgical change consistent partial colectomy and LEFT colostomy. Electronically Signed   By: Genevive Bi M.D.   On: 09/05/2022 16:56   US PELVIC COMPLETE WITH TRANSVAGINAL  Result Date:  08/28/2022 CLINICAL DATA:  Soft tissue mass in left side of pelvis seen in previous CT EXAM: TRANSABDOMINAL AND TRANSVAGINAL ULTRASOUND OF PELVIS TECHNIQUE: Both transabdominal and transvaginal ultrasound examinations of the pelvis were performed. Transabdominal technique was performed for global imaging of the pelvis including uterus, ovaries, adnexal regions, and pelvic cul-de-sac. It was necessary to proceed with endovaginal exam following the transabdominal exam to visualize the left adnexal mass. COMPARISON:  CT done on 08/17/2022 FINDINGS: Uterus Not seen consistent with hysterectomy.  Right ovary Not sonographically visualized. Left ovary Knees 5.3 x 4.7 x 4.2 cm solid lesion with slightly lobulated margins and internal vascularity in left side of pelvis. This may suggest metastatic lymphadenopathy or local recurrence of colon carcinoma. Other findings No abnormal free fluid. IMPRESSION: Status post hysterectomy. There is 5.3 x 4.7 x 4.2 cm solid lesion in left side of pelvis which may suggest metastatic lymphadenopathy or local recurrence of colon carcinoma. Another possibility would be malignant neoplasm arising from the left ovary. Left ovary could not be distinctly identified. Electronically Signed   By: Ernie Avena M.D.   On: 08/28/2022 14:23       IMPRESSION/PLAN: 1. Locally recurrent Stage IIB, p4aN0M0, adenocarcinoma of the sigmoid colon. Dr. Mitzi Hansen discusses the pathology findings the patient's course to date.  He reviews the rationale for considering local therapy with goals of local control leading up to additional surgery.  He offers chemoradiation to the pelvis.  We discussed the risks, benefits, short, and long term effects of radiotherapy, as well as the curative intent, and the patient is interested in proceeding. Dr. Mitzi Hansen discusses the delivery and logistics of radiotherapy and anticipates a course of 5 1/2 weeks of radiotherapy to the left pelvis. The patient will be contacted to  coordinate treatment planning by our simulation department. Written consent is obtained and placed in the chart, a copy was provided to the patient.  We anticipate beginning treatment on 10/09/2022. 2. Risks of pelvic floor dysfunction from radiotherapy. We discussed the importance of evaluation with physical therapy prior to pelvic radiation. A referral was placed to physical therapy today. Vaginal dilators were also provided and she will work with PT on the use and frequency of use. 3. Conraceptive Counseling.  The patient does not need pregnancy testing as she has had prior hysterectomy.   In a visit lasting 60 minutes, greater than 50% of the time was spent face to face discussing the patient's condition, in preparation for the discussion, and coordinating the patient's care.   The above documentation reflects my direct findings during this shared patient visit. Please see the separate note by Dr. Mitzi Hansen on this date for the remainder of the patient's plan of care.    Osker Mason, [facility]   **Disclaimer: This note was dictated with voice recognition software. Similar sounding words can inadvertently be transcribed and this note may contain transcription errors which may not have been corrected upon publication of note.**

## 2022-09-27 ENCOUNTER — Other Ambulatory Visit (HOSPITAL_COMMUNITY): Payer: Self-pay

## 2022-09-27 ENCOUNTER — Other Ambulatory Visit: Payer: Self-pay

## 2022-09-27 ENCOUNTER — Telehealth: Payer: Self-pay | Admitting: Hematology

## 2022-09-27 NOTE — Addendum Note (Signed)
Encounter addended by: Dorothy Puffer, MD on: 09/27/2022 1:58 PM  Actions taken: Pend clinical note

## 2022-09-27 NOTE — Telephone Encounter (Signed)
Oral Oncology Patient Advocate Encounter  Able to bypass old primary insurance and process claim with Medicaid. Spoke with patient and set up fill of medication so they will have it in hand prior to therapy start date.  Medication will be filled at [facility].  Jinger Neighbors, CPhT-Adv Oncology Pharmacy Patient Advocate [facility] Direct Number: [phone]  Fax: [phone]

## 2022-09-27 NOTE — Addendum Note (Signed)
Encounter addended by: Dorothy Puffer, MD on: 09/27/2022 9:23 PM  Actions taken: Delete clinical note

## 2022-09-27 NOTE — Telephone Encounter (Signed)
Oral Chemotherapy Pharmacist Encounter  I spoke with patient for overview of: Xeloda (capecitabine) for the treatment of colon cancer in conjunction with radiation, planned for duration of radiation (~5 1/2 weeks).   Counseled patient on administration, dosing, side effects, monitoring, drug-food interactions, safe handling, storage, and disposal.  Patient will take Xeloda 500mg  tablets, 3 tablets (1500mg ) by mouth in AM and 3 tabs (1500mg ) by mouth in PM, within 30 minutes of finishing meals, on days of radiation only.  Xeloda and radiation start date: tentatively 10/09/22 - patient knows not to start until first day of radiation  Adverse effects of Xeloda include but are not limited to: fatigue, decreased blood counts, GI upset, diarrhea, mouth sores, and hand-foot syndrome. Hand-foot syndrome: discussed use of cream such as U[place]derly Smooth Extra Care 20 or equivalent advanced care cream that has 20% urea content for advanced skin hydration while on Xeloda Diarrhea: Patient will obtain Imodium (loperamide) to have on hand if they experience diarrhea. Patient knows to alert the office of 4 or more loose stools above baseline.  Patient offered referral to diclofenac prophylaxis hand-foot syndrome research project. Patient declined referral.   Reviewed with patient importance of keeping a medication schedule and plan for any missed doses. No barriers to medication adherence identified.  Medication reconciliation performed and medication/allergy list updated. Patient states she no longer takes pantoprazole - medication list updated. Patient will be done with Flagyl Rx by the time she starts Xeloda.   All questions answered.  Ms. Ventrone voiced understanding and appreciation.   Medication education handout placed in mail for patient. Patient knows to call the office with questions or concerns. Oral Chemotherapy Clinic phone number provided to patient.   Lenord Carbo, PharmD, BCPS,  BCOP Hematology/Oncology Clinical Pharmacist [facility] Oral Chemotherapy Navigation Clinics [phone] 09/27/2022 11:24 AM

## 2022-09-29 ENCOUNTER — Inpatient Hospital Stay: Payer: Medicaid Other | Admitting: Licensed Clinical Social Worker

## 2022-09-29 ENCOUNTER — Ambulatory Visit
Admission: RE | Admit: 2022-09-29 | Discharge: 2022-09-29 | Disposition: A | Discharge status: Home / Self Care | Payer: Medicaid Other | Source: Ambulatory Visit | Attending: Radiation Oncology | Admitting: Radiation Oncology

## 2022-09-29 DIAGNOSIS — C187 Malignant neoplasm of sigmoid colon: Secondary | ICD-10-CM | POA: Insufficient documentation

## 2022-09-29 DIAGNOSIS — Z510 Encounter for antineoplastic radiation therapy: Secondary | ICD-10-CM | POA: Insufficient documentation

## 2022-09-29 DIAGNOSIS — C7989 Secondary malignant neoplasm of other specified sites: Secondary | ICD-10-CM | POA: Insufficient documentation

## 2022-09-29 NOTE — Progress Notes (Signed)
[facility] CSW Progress Note  Clinical Child psychotherapist met with patient to sign referral for the [employer].  Referral submitted to the [facility] on behalf of pt.  CSW to remain available as appropriate throughout duration of treatment.      Rachel Moulds, LCSW Clinical Social Worker [facility]    Patient is participating in a Managed Medicaid Plan:  Yes

## 2022-10-04 DIAGNOSIS — Z510 Encounter for antineoplastic radiation therapy: Secondary | ICD-10-CM | POA: Diagnosis not present

## 2022-10-05 ENCOUNTER — Other Ambulatory Visit: Payer: Medicaid Other

## 2022-10-09 ENCOUNTER — Other Ambulatory Visit: Payer: Self-pay

## 2022-10-09 ENCOUNTER — Ambulatory Visit
Admission: RE | Admit: 2022-10-09 | Discharge: 2022-10-09 | Disposition: A | Discharge status: Home / Self Care | Payer: Medicaid Other | Source: Ambulatory Visit | Attending: Radiation Oncology | Admitting: Radiation Oncology

## 2022-10-09 ENCOUNTER — Other Ambulatory Visit (HOSPITAL_COMMUNITY): Payer: Self-pay

## 2022-10-09 DIAGNOSIS — Z510 Encounter for antineoplastic radiation therapy: Secondary | ICD-10-CM | POA: Diagnosis not present

## 2022-10-09 LAB — RAD ONC ARIA SESSION SUMMARY
Course Elapsed Days: 0
Plan Fractions Treated to Date: 1
Plan Prescribed Dose Per Fraction: 1.8 Gy
Plan Total Fractions Prescribed: 25
Plan Total Prescribed Dose: 45 Gy
Reference Point Dosage Given to Date: 1.8 Gy
Reference Point Session Dosage Given: 1.8 Gy
Session Number: 1

## 2022-10-09 NOTE — Assessment & Plan Note (Signed)
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 [name]. 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 Margins and lymph nodes negative. -she received adjuvant CAPOX 03/14/21 - 05/23/21 to reduce her risk of recurrence. she had additional Xeloda alone to complete 6 months of treatment.  -surveillance colonoscopy on 06/21/21 with Dr. Leonides Schanz was unremarkable. Plan for repeat in 1 year. -restaging CT AP on 08/11/21 showed: a soft tissue nodule favored to be related to left ovary, but adenopathy not completely excluded. -Due to rising CEA, we repeated CT on 08/17/2022 which showed enlarging rounded lesion along the LEFT iliac vessels, now measuring 4.2cm, concerning for cancer recurrence. -PET from 09/05/22 showed Intensely hypermetabolic mass in the LEFT pelvis is consistent with metastatic lymphadenopathy or local recurrence of colorectal carcinoma. -pt was referred to Dr. Glennis Brink at [facility] and she recommended neoadjuvant RT, I recommend with concurrent Xeloda. She agrees to proceed. Will start today

## 2022-10-10 ENCOUNTER — Ambulatory Visit
Admission: RE | Admit: 2022-10-10 | Discharge: 2022-10-10 | Disposition: A | Discharge status: Home / Self Care | Payer: Medicaid Other | Source: Ambulatory Visit | Attending: Radiation Oncology

## 2022-10-10 ENCOUNTER — Other Ambulatory Visit: Payer: Self-pay

## 2022-10-10 ENCOUNTER — Other Ambulatory Visit (HOSPITAL_COMMUNITY): Payer: Self-pay

## 2022-10-10 ENCOUNTER — Encounter: Payer: Self-pay | Admitting: Hematology

## 2022-10-10 ENCOUNTER — Inpatient Hospital Stay: Payer: Medicaid Other

## 2022-10-10 ENCOUNTER — Inpatient Hospital Stay (HOSPITAL_BASED_OUTPATIENT_CLINIC_OR_DEPARTMENT_OTHER): Payer: Medicaid Other | Admitting: Hematology

## 2022-10-10 VITALS — BP 118/73 | HR 65 | Temp 98.8°F | Resp 18 | Ht 66.0 in | Wt 154.6 lb

## 2022-10-10 DIAGNOSIS — C187 Malignant neoplasm of sigmoid colon: Secondary | ICD-10-CM

## 2022-10-10 DIAGNOSIS — Z510 Encounter for antineoplastic radiation therapy: Secondary | ICD-10-CM | POA: Diagnosis not present

## 2022-10-10 LAB — CBC WITH DIFFERENTIAL (CANCER CENTER ONLY)
Abs Immature Granulocytes: 0.01 10*3/uL (ref 0.00–0.07)
Basophils Absolute: 0 10*3/uL (ref 0.0–0.1)
Basophils Relative: 1 %
Eosinophils Absolute: 0.1 10*3/uL (ref 0.0–0.5)
Eosinophils Relative: 2 %
HCT: 39.1 % (ref 36.0–46.0)
Hemoglobin: 12.1 g/dL (ref 12.0–15.0)
Immature Granulocytes: 0 %
Lymphocytes Relative: 30 %
Lymphs Abs: 1.2 10*3/uL (ref 0.7–4.0)
MCH: 25.4 pg — ABNORMAL LOW (ref 26.0–34.0)
MCHC: 30.9 g/dL (ref 30.0–36.0)
MCV: 82 fL (ref 80.0–100.0)
Monocytes Absolute: 0.3 10*3/uL (ref 0.1–1.0)
Monocytes Relative: 8 %
Neutro Abs: 2.3 10*3/uL (ref 1.7–7.7)
Neutrophils Relative %: 59 %
Platelet Count: 239 10*3/uL (ref 150–400)
RBC: 4.77 MIL/uL (ref 3.87–5.11)
RDW: 14.9 % (ref 11.5–15.5)
WBC Count: 3.9 10*3/uL — ABNORMAL LOW (ref 4.0–10.5)
nRBC: 0 % (ref 0.0–0.2)

## 2022-10-10 LAB — RAD ONC ARIA SESSION SUMMARY
Course Elapsed Days: 1
Plan Fractions Treated to Date: 2
Plan Prescribed Dose Per Fraction: 1.8 Gy
Plan Total Fractions Prescribed: 25
Plan Total Prescribed Dose: 45 Gy
Reference Point Dosage Given to Date: 3.6 Gy
Reference Point Session Dosage Given: 1.8 Gy
Session Number: 2

## 2022-10-10 LAB — CMP (CANCER CENTER ONLY)
ALT: 8 U/L (ref 0–44)
AST: 18 U/L (ref 15–41)
Albumin: 4.3 g/dL (ref 3.5–5.0)
Alkaline Phosphatase: 57 U/L (ref 38–126)
Anion gap: 7 (ref 5–15)
BUN: 13 mg/dL (ref 6–20)
CO2: 26 mmol/L (ref 22–32)
Calcium: 9.8 mg/dL (ref 8.9–10.3)
Chloride: 107 mmol/L (ref 98–111)
Creatinine: 0.8 mg/dL (ref 0.44–1.00)
GFR, Estimated: >60 mL/min (ref >60)
Glucose, Bld: 88 mg/dL (ref 70–99)
Potassium: 3.9 mmol/L (ref 3.5–5.1)
Sodium: 140 mmol/L (ref 135–145)
Total Bilirubin: 0.4 mg/dL (ref 0.3–1.2)
Total Protein: 8.5 g/dL — ABNORMAL HIGH (ref 6.5–8.1)

## 2022-10-10 LAB — CEA (ACCESS): CEA (CHCC): 30.06 ng/mL — ABNORMAL HIGH (ref 0.00–5.00)

## 2022-10-10 MED ORDER — ONDANSETRON HCL 8 MG PO TABS
8.0000 mg | ORAL_TABLET | Freq: Three times a day (TID) | ORAL | 1 refills | Status: DC | PRN
Start: 2022-10-10 — End: 2023-08-06
  Filled 2022-10-10 – 2022-10-17 (×2): qty 30, 10d supply, fill #0

## 2022-10-10 NOTE — Progress Notes (Signed)
[facility]   Telephone:(336) [phone] Fax:(336) [phone]   Clinic Follow up Note   Patient Care Team: Caudle, Shelton Silvas, FNP as PCP - General (Family Medicine) Luretha Murphy, MD as Consulting Physician (General Surgery) Kathi Der, MD as Consulting Physician (Gastroenterology) Malachy Mood, MD as Consulting Physician (Hematology) Allie Bossier, MD as Consulting Physician (Obstetrics and Gynecology)  Date of Service:  10/10/2022  CHIEF COMPLAINT: f/u of  sigmoid colon cancer    CURRENT THERAPY:  neoadjuvant RT w/ Concurrent Xeloda starting 9/16   ASSESSMENT:  Barbara Valdez is a 47 y.o. 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 [name]. 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 Margins and lymph nodes negative. -she received adjuvant CAPOX 03/14/21 - 05/23/21 to reduce her risk of recurrence. she had additional Xeloda alone to complete 6 months of treatment.  -surveillance colonoscopy on 06/21/21 with Dr. Leonides Schanz was unremarkable. Plan for repeat in 1 year. -restaging CT AP on 08/11/21 showed: a soft tissue nodule favored to be related to left ovary, but adenopathy not completely excluded. -Due to rising CEA, we repeated CT on 08/17/2022 which showed enlarging rounded lesion along the LEFT iliac vessels, now measuring 4.2cm, concerning for cancer recurrence. -PET from 09/05/22 showed Intensely hypermetabolic mass in the LEFT pelvis is consistent with metastatic lymphadenopathy or local recurrence of colorectal carcinoma. -pt was referred to Dr. Glennis Brink at [facility] and she recommended neoadjuvant RT, I recommend with concurrent Xeloda. She agrees to proceed. Will start today      PLAN: -lab reviewed -CMP -pending -she wills start Xeloda this morning - I prescribe Zofran for  Nausea -I recommend pt to skin moisturize -lab and f/u on 10/24/22   SUMMARY OF ONCOLOGIC HISTORY: Oncology History Overview Note   Cancer Staging  Cancer of sigmoid colon [facility]) Staging form: Colon and Rectum, AJCC 8th Edition - Pathologic stage from 02/10/2021: Stage IIB (pT4a, pN0, cM0) - Signed by Malachy Mood, MD on 02/28/2021    Cancer of sigmoid colon (HCC)  11/27/2020 Imaging   CLINICAL DATA:  Abdominal pain that is began Monday. Patient reports seen scant amounts of dark blood in stool. Feels bloated.   EXAM: CT ABDOMEN AND PELVIS WITH CONTRAST  IMPRESSION: 1. Proctocolitis involving the mid and distal sigmoid colon and upper rectum with a 3.9 cm gas containing interloop abscess centered in the sigmoid mesocolon. 2. Severe fecal burden.  No findings of bowel obstruction. 3. An enlarged left common iliac lymph node is likely reactive. 4. A 2.7 cm hemangioma is noted in the right hepatic lobe. 5. Mild focal bronchiolitis in the right lower lobe.   02/07/2021 Imaging   CLINICAL DATA:  Abdominal pain, cramping, constipation   EXAM: CT ABDOMEN AND PELVIS WITH CONTRAST  IMPRESSION: 1. There is severe, circumferential wall thickening and mucosal hyperenhancement involving the distal sigmoid colon and rectum to the anus, generally tethered appearance unchanged, involving at least 15-20 cm of distal sigmoid and rectum. 2. There is again seen an abscess at the medial aspect of the rectosigmoid junction measuring approximately 3.0 x 1.6 cm. 3. Findings are generally consistent with nonspecific infectious, inflammatory, or ischemic colitis, including inflammatory bowel disease such as Crohn's disease or ulcerative colitis; however an underlying malignancy is very difficult to exclude, particularly given substantial wall thickening. 4. Large burden of stool throughout the extremely redundant proximal  colon, likely reflecting some degree of obstipation. 5. Enlarged left external iliac  lymph nodes measuring up to 1.1 x 0.9 cm fall, nonspecific, possibly reactive, however nodal metastatic disease is not excluded per above.   02/09/2021 Initial Biopsy   FINAL MICROSCOPIC DIAGNOSIS:   A. COLON MASS, SIGMOID, BIOPSY:  - Adenomatous lesion with extensive high-grade dysplasia, see comment   B. RECTAL, COLON, SIGMOID, BIOPSY:  - Mild acute/ subacute nonspecific colitis, see comment  - Negative for definite features of chronicity, granulomas or dysplasia   COMMENT:  A.  Evidence of invasive carcinoma is not seen in the submitted biopsies but the biopsy fragments appear superficial and a more severe deeper process cannot be ruled out.  B.  Histologic features in the rectosigmoid biopsies are not compatible with untreated inflammatory bowel disease.  Differential diagnosis can include infection, drug-effect and stercoral proctitis among other possibilities.    02/09/2021 Procedure   Colonoscopy, Dr. Levora Angel  Impression: - Preparation of the colon was fair. - Likely malignant partially obstructing tumor in the distal sigmoid colon. Biopsied. Tattooed. - Congested, inflamed and thickened folds of the mucosa in the recto-sigmoid colon. Biopsied. Tattooed.   02/10/2021 Cancer Staging   Staging form: Colon and Rectum, AJCC 8th Edition - Pathologic stage from 02/10/2021: Stage IIB (pT4a, pN0, cM0) - Signed by Malachy Mood, MD on 02/28/2021 Stage prefix: Initial diagnosis Total positive nodes: 0 Histologic grading system: 4 grade system Histologic grade (G): G2 Residual tumor (R): R0 - None   02/10/2021 Definitive Surgery   FINAL MICROSCOPIC DIAGNOSIS:   A. COLON, SIGMOID:  - Invasive moderately differentiated adenocarcinoma.  - Twenty-seven lymph nodes, negative for metastatic carcinoma (0/27).  - See oncology table below.   B. COLON, DISTAL, SIGMOID:  - Segment of colon with serosal adhesions, acute inflammation, and tattoo pigment.  - Negative for malignancy.    02/11/2021  Initial Diagnosis   Cancer of sigmoid colon (HCC)   03/14/2021 - 05/23/2021 Chemotherapy   Patient is on Treatment Plan : COLORECTAL Xelox (Capeox) q21d     04/20/2021 Genetic Testing   Negative hereditary cancer genetic testing: no pathogenic variants detected in [employer] CustomNext-Cancer +RNAinsight Panel.  Report date is April 20, 2021.   The CustomNext-Cancer+RNAinsight panel offered by [employer] includes sequencing and rearrangement analysis for the following 47 genes:  APC, ATM, AXIN2, BARD1, BMPR1A, BRCA1, BRCA2, BRIP1, CDH1, CDK4, CDKN2A, CHEK2, DICER1, EPCAM, GREM1, HOXB13, MEN1, MLH1, MSH2, MSH3, MSH6, MUTYH, NBN, NF1, NF2, NTHL1, PALB2, PMS2, POLD1, POLE, PTEN, RAD51C, RAD51D, RECQL, RET, SDHA, SDHAF2, SDHB, SDHC, SDHD, SMAD4, SMARCA4, STK11, TP53, TSC1, TSC2, and VHL.  RNA data is routinely analyzed for use in variant interpretation for all genes.    02/20/2022 Imaging    IMPRESSION: 1. Similar postsurgical changes of partial sigmoidectomy with Hartmann's pouch formation and left anterior abdominal wall colostomy. No noncontrast CT evidence of local recurrence. New nonobstructive bowel/fluid containing peristomal hernia. 2. No convincing evidence of metastatic disease within the chest, abdomen, or pelvis on this noncontrast enhanced examination. 3. New multifocal subpleural foci of nodularity correspond with regions of atelectasis seen on prior imaging, favored to reflect subsegmental atelectasis. Stable 3 mm right lower lobe pulmonary nodule, favored benign. Consider attention on dedicated short-term interval follow-up chest CT. 4. Stable soft tissue along the left iliac vessels again favored to reflect left ovary given its continuity with the gonadal vessels but difficult to evaluate given lack of intravenous contrast material. Correlation with CEA levels and continued attention on follow-up imaging  suggested. 5. Moderate volume of formed stool throughout the colon.  Correlate for constipation.      INTERVAL HISTORY:  Barbara Valdez is here for a follow up of  sigmoid colon cancer . She was last seen by me on 09/26/2022. She presents to the clinic alone. Pt stated that she started radiation today and the Xeloda. Pt wanted clarification how to take the oral chemo.      All other systems were reviewed with the patient and are negative.  MEDICAL HISTORY:  Past Medical History:  Diagnosis Date   Anemia    Anxiety    Colon cancer (HCC) 01/2021   stage 2   Complication of anesthesia    took a long time to wake up after surgery   Family history of breast cancer 03/30/2021   Family history of colon cancer 03/30/2021    SURGICAL HISTORY: Past Surgical History:  Procedure Laterality Date   ABDOMINAL HYSTERECTOMY Bilateral 09/26/2016   Procedure: HYSTERECTOMY ABDOMINAL W/ BILATERAL SALPINGECTOMY;  Surgeon: Allie Bossier, MD;  Location: [facility];  Service: Gynecology;  Laterality: Bilateral;   BIOPSY  02/09/2021   Procedure: BIOPSY;  Surgeon: Kathi Der, MD;  Location: [facility];  Service: Gastroenterology;;   BREAST BIOPSY Left    COLONOSCOPY N/A 02/09/2021   Procedure: COLONOSCOPY;  Surgeon: Kathi Der, MD;  Location: [facility];  Service: Gastroenterology;  Laterality: N/A;   LAPAROSCOPIC PARTIAL COLECTOMY N/A 02/10/2021   Procedure: ROBOTIC ASSISTED PARTIAL COLECTOMY WITH COLOSTOMY;  Surgeon: Luretha Murphy, MD;  Location: [facility];  Service: General;  Laterality: N/A;   SUBMUCOSAL TATTOO INJECTION  02/09/2021   Procedure: SUBMUCOSAL TATTOO INJECTION;  Surgeon: Kathi Der, MD;  Location: [facility];  Service: Gastroenterology;;   TUBAL LIGATION      I have reviewed the social history and family history with the patient and they are unchanged from previous note.  ALLERGIES:  is allergic to ferrlecit [na ferric gluc cplx in sucrose].  MEDICATIONS:  Current Outpatient Medications  Medication Sig Dispense Refill    acetaminophen (TYLENOL) 500 MG tablet Take 2 tablets (1,000 mg total) by mouth every 6 (six) hours as needed for mild pain. 30 tablet 0   capecitabine (XELODA) 500 MG tablet Take 3 tablets (1,500 mg total) by mouth 2 (two) times daily after a meal. Take the days of radiation, Monday through Friday, off on weekends 90 tablet 1   metroNIDAZOLE (FLAGYL) 500 MG tablet Take 1 tablet (500 mg total) by mouth 2 (two) times daily. (Patient not taking: Reported on 09/26/2022) 14 tablet 0   ondansetron (ZOFRAN) 8 MG tablet Take 1 tablet (8 mg total) by mouth every 8 (eight) hours as needed for refractory nausea / vomiting. Start on day 3 after chemotherapy. 30 tablet 1   pantoprazole (PROTONIX) 40 MG tablet Take 40 mg by mouth daily as needed. (Patient not taking: Reported on 09/26/2022)     polyethylene glycol (MIRALAX / GLYCOLAX) 17 g packet Take 17 g by mouth daily. (Patient not taking: Reported on 09/26/2022) 14 each 0   No current facility-administered medications for this visit.    PHYSICAL EXAMINATION: ECOG PERFORMANCE STATUS: 0 - Asymptomatic  Vitals:   10/10/22 1026  BP: 118/73  Pulse: 65  Resp: 18  Temp: 98.8 F (37.1 C)  SpO2: 100%   Wt Readings from Last 3 Encounters:  10/10/22 154 lb 9.6 oz (70.1 kg)  09/26/22 152 lb 3.2 oz (69 kg)  09/26/22 152 lb 14.4 oz (69.4 kg)  GENERAL:alert, no distress and comfortable SKIN: skin color normal, no rashes or significant lesions EYES: normal, Conjunctiva are pink and non-injected, sclera clear  NEURO: alert & oriented x 3 with fluent speech  LABORATORY DATA:  I have reviewed the data as listed    Latest Ref Rng & Units 10/10/2022    9:15 AM 08/09/2022    8:46 AM 07/25/2022    8:18 AM  CBC  WBC 4.0 - 10.5 K/uL 3.9  4.5  4.7   Hemoglobin 12.0 - 15.0 g/dL 13.2  44.0  10.2   Hematocrit 36.0 - 46.0 % 39.1  35.7  37.8   Platelets 150 - 400 K/uL 239  250  246         Latest Ref Rng & Units 10/10/2022    9:15 AM 08/09/2022    8:46 AM 07/25/2022     8:18 AM  CMP  Glucose 70 - 99 mg/dL 88  87  86   BUN 6 - 20 mg/dL 13  14  9    Creatinine 0.44 - 1.00 mg/dL 7.25  3.66  4.40   Sodium 135 - 145 mmol/L 140  144  143   Potassium 3.5 - 5.1 mmol/L 3.9  4.3  4.0   Chloride 98 - 111 mmol/L 107  104  108   CO2 22 - 32 mmol/L 26  24  28    Calcium 8.9 - 10.3 mg/dL 9.8  34.7  9.2   Total Protein 6.5 - 8.1 g/dL 8.5  7.8  7.6   Total Bilirubin 0.3 - 1.2 mg/dL 0.4  0.3  0.4   Alkaline Phos 38 - 126 U/L 57  75  66   AST 15 - 41 U/L 18  22  19    ALT 0 - 44 U/L 8  9  7        RADIOGRAPHIC STUDIES: I have personally reviewed the radiological images as listed and agreed with the findings in the report. No results found.    No orders of the defined types were placed in this encounter.  All questions were answered. The patient knows to call the clinic with any problems, questions or concerns. No barriers to learning was detected. The total time spent in the appointment was 15 minutes.     Malachy Mood, MD 10/10/2022   Carolin Coy, CMA, am acting as scribe for Malachy Mood, MD.   I have reviewed the above documentation for accuracy and completeness, and I agree with the above.

## 2022-10-11 ENCOUNTER — Other Ambulatory Visit: Payer: Self-pay

## 2022-10-11 ENCOUNTER — Ambulatory Visit: Payer: Medicaid Other | Admitting: Hematology

## 2022-10-11 ENCOUNTER — Ambulatory Visit
Admission: RE | Admit: 2022-10-11 | Discharge: 2022-10-11 | Disposition: A | Discharge status: Home / Self Care | Payer: Medicaid Other | Source: Ambulatory Visit | Attending: Radiation Oncology

## 2022-10-11 DIAGNOSIS — Z510 Encounter for antineoplastic radiation therapy: Secondary | ICD-10-CM | POA: Diagnosis not present

## 2022-10-11 LAB — RAD ONC ARIA SESSION SUMMARY
Course Elapsed Days: 2
Plan Fractions Treated to Date: 3
Plan Prescribed Dose Per Fraction: 1.8 Gy
Plan Total Fractions Prescribed: 25
Plan Total Prescribed Dose: 45 Gy
Reference Point Dosage Given to Date: 5.4 Gy
Reference Point Session Dosage Given: 1.8 Gy
Session Number: 3

## 2022-10-12 ENCOUNTER — Other Ambulatory Visit: Payer: Self-pay

## 2022-10-12 ENCOUNTER — Ambulatory Visit
Admission: RE | Admit: 2022-10-12 | Discharge: 2022-10-12 | Disposition: A | Discharge status: Home / Self Care | Payer: Medicaid Other | Source: Ambulatory Visit | Attending: Radiation Oncology | Admitting: Radiation Oncology

## 2022-10-12 ENCOUNTER — Encounter: Payer: Self-pay | Admitting: Pharmacist

## 2022-10-12 DIAGNOSIS — Z510 Encounter for antineoplastic radiation therapy: Secondary | ICD-10-CM | POA: Diagnosis not present

## 2022-10-12 LAB — RAD ONC ARIA SESSION SUMMARY
Course Elapsed Days: 3
Plan Fractions Treated to Date: 4
Plan Prescribed Dose Per Fraction: 1.8 Gy
Plan Total Fractions Prescribed: 25
Plan Total Prescribed Dose: 45 Gy
Reference Point Dosage Given to Date: 7.2 Gy
Reference Point Session Dosage Given: 1.8 Gy
Session Number: 4

## 2022-10-13 ENCOUNTER — Other Ambulatory Visit (HOSPITAL_COMMUNITY): Payer: Self-pay

## 2022-10-13 ENCOUNTER — Ambulatory Visit
Admission: RE | Admit: 2022-10-13 | Discharge: 2022-10-13 | Disposition: A | Discharge status: Home / Self Care | Payer: Medicaid Other | Source: Ambulatory Visit | Attending: Radiation Oncology

## 2022-10-13 ENCOUNTER — Other Ambulatory Visit: Payer: Self-pay

## 2022-10-13 DIAGNOSIS — Z510 Encounter for antineoplastic radiation therapy: Secondary | ICD-10-CM | POA: Diagnosis not present

## 2022-10-13 LAB — RAD ONC ARIA SESSION SUMMARY
Course Elapsed Days: 4
Plan Fractions Treated to Date: 5
Plan Prescribed Dose Per Fraction: 1.8 Gy
Plan Total Fractions Prescribed: 25
Plan Total Prescribed Dose: 45 Gy
Reference Point Dosage Given to Date: 9 Gy
Reference Point Session Dosage Given: 1.8 Gy
Session Number: 5

## 2022-10-16 ENCOUNTER — Other Ambulatory Visit: Payer: Self-pay

## 2022-10-16 ENCOUNTER — Ambulatory Visit
Admission: RE | Admit: 2022-10-16 | Discharge: 2022-10-16 | Disposition: A | Discharge status: Home / Self Care | Payer: Medicaid Other | Source: Ambulatory Visit | Attending: Radiation Oncology

## 2022-10-16 DIAGNOSIS — Z510 Encounter for antineoplastic radiation therapy: Secondary | ICD-10-CM | POA: Diagnosis not present

## 2022-10-16 LAB — RAD ONC ARIA SESSION SUMMARY
Course Elapsed Days: 7
Plan Fractions Treated to Date: 6
Plan Prescribed Dose Per Fraction: 1.8 Gy
Plan Total Fractions Prescribed: 25
Plan Total Prescribed Dose: 45 Gy
Reference Point Dosage Given to Date: 10.8 Gy
Reference Point Session Dosage Given: 1.8 Gy
Session Number: 6

## 2022-10-17 ENCOUNTER — Other Ambulatory Visit: Payer: Self-pay

## 2022-10-17 ENCOUNTER — Ambulatory Visit
Admission: RE | Admit: 2022-10-17 | Discharge: 2022-10-17 | Disposition: A | Discharge status: Home / Self Care | Payer: Medicaid Other | Source: Ambulatory Visit | Attending: Radiation Oncology | Admitting: Radiation Oncology

## 2022-10-17 ENCOUNTER — Other Ambulatory Visit (HOSPITAL_COMMUNITY): Payer: Self-pay

## 2022-10-17 DIAGNOSIS — Z510 Encounter for antineoplastic radiation therapy: Secondary | ICD-10-CM | POA: Diagnosis not present

## 2022-10-17 LAB — RAD ONC ARIA SESSION SUMMARY
Course Elapsed Days: 8
Plan Fractions Treated to Date: 7
Plan Prescribed Dose Per Fraction: 1.8 Gy
Plan Total Fractions Prescribed: 25
Plan Total Prescribed Dose: 45 Gy
Reference Point Dosage Given to Date: 12.6 Gy
Reference Point Session Dosage Given: 1.8 Gy
Session Number: 7

## 2022-10-17 NOTE — Progress Notes (Signed)
Specialty Pharmacy Refill Coordination Note  Angelrose Fagundo is a 47 y.o. female contacted today regarding refills of specialty medication(s) Capecitabine .  Patient requested Delivery  on 10/26/22  to verified address [address] [place] [address]   Medication will be filled on 10/25/22.   Specialty Pharmacy Ongoing Clinical Assessment Note  Amrie Hunstad is a 47 y.o. female who is being followed by the specialty pharmacy service for RxSp Oncology   Review of patient's specialty medication(s) Capecitabine  occurred today.   Patient has missed 1  doses in the last 4 weeks.   Patient did not have any additional questions or concerns.   Therapeutic benefit summary: Unable to assess   Adverse events/side effects summary: No adverse events/side effects   Patient's therapy is appropriate to : Continue    Goals      Slow Disease Progression     Patient is  new to treatment . Patient will maintain adherence         Follow up:  3 months

## 2022-10-18 ENCOUNTER — Ambulatory Visit
Admission: RE | Admit: 2022-10-18 | Discharge: 2022-10-18 | Disposition: A | Discharge status: Home / Self Care | Payer: Medicaid Other | Source: Ambulatory Visit | Attending: Radiation Oncology

## 2022-10-18 ENCOUNTER — Other Ambulatory Visit: Payer: Self-pay

## 2022-10-18 DIAGNOSIS — Z510 Encounter for antineoplastic radiation therapy: Secondary | ICD-10-CM | POA: Diagnosis not present

## 2022-10-18 LAB — RAD ONC ARIA SESSION SUMMARY
Course Elapsed Days: 9
Plan Fractions Treated to Date: 8
Plan Prescribed Dose Per Fraction: 1.8 Gy
Plan Total Fractions Prescribed: 25
Plan Total Prescribed Dose: 45 Gy
Reference Point Dosage Given to Date: 14.4 Gy
Reference Point Session Dosage Given: 1.8 Gy
Session Number: 8

## 2022-10-19 ENCOUNTER — Other Ambulatory Visit: Payer: Self-pay

## 2022-10-19 ENCOUNTER — Ambulatory Visit
Admission: RE | Admit: 2022-10-19 | Discharge: 2022-10-19 | Disposition: A | Discharge status: Home / Self Care | Payer: Medicaid Other | Source: Ambulatory Visit | Attending: Radiation Oncology | Admitting: Radiation Oncology

## 2022-10-19 ENCOUNTER — Other Ambulatory Visit (HOSPITAL_COMMUNITY): Payer: Self-pay

## 2022-10-19 DIAGNOSIS — Z510 Encounter for antineoplastic radiation therapy: Secondary | ICD-10-CM | POA: Diagnosis not present

## 2022-10-19 LAB — RAD ONC ARIA SESSION SUMMARY
Course Elapsed Days: 10
Plan Fractions Treated to Date: 9
Plan Prescribed Dose Per Fraction: 1.8 Gy
Plan Total Fractions Prescribed: 25
Plan Total Prescribed Dose: 45 Gy
Reference Point Dosage Given to Date: 16.2 Gy
Reference Point Session Dosage Given: 1.8 Gy
Session Number: 9

## 2022-10-20 ENCOUNTER — Other Ambulatory Visit: Payer: Self-pay

## 2022-10-20 ENCOUNTER — Ambulatory Visit
Admission: RE | Admit: 2022-10-20 | Discharge: 2022-10-20 | Disposition: A | Discharge status: Home / Self Care | Payer: Medicaid Other | Source: Ambulatory Visit | Attending: Radiation Oncology | Admitting: Radiation Oncology

## 2022-10-20 DIAGNOSIS — Z510 Encounter for antineoplastic radiation therapy: Secondary | ICD-10-CM | POA: Diagnosis not present

## 2022-10-20 LAB — RAD ONC ARIA SESSION SUMMARY
Course Elapsed Days: 11
Plan Fractions Treated to Date: 10
Plan Prescribed Dose Per Fraction: 1.8 Gy
Plan Total Fractions Prescribed: 25
Plan Total Prescribed Dose: 45 Gy
Reference Point Dosage Given to Date: 18 Gy
Reference Point Session Dosage Given: 1.8 Gy
Session Number: 10

## 2022-10-23 ENCOUNTER — Ambulatory Visit
Admission: RE | Admit: 2022-10-23 | Discharge: 2022-10-23 | Disposition: A | Discharge status: Home / Self Care | Payer: Medicaid Other | Source: Ambulatory Visit | Attending: Radiation Oncology

## 2022-10-23 ENCOUNTER — Other Ambulatory Visit: Payer: Self-pay

## 2022-10-23 DIAGNOSIS — Z510 Encounter for antineoplastic radiation therapy: Secondary | ICD-10-CM | POA: Diagnosis not present

## 2022-10-23 LAB — RAD ONC ARIA SESSION SUMMARY
Course Elapsed Days: 14
Plan Fractions Treated to Date: 11
Plan Prescribed Dose Per Fraction: 1.8 Gy
Plan Total Fractions Prescribed: 25
Plan Total Prescribed Dose: 45 Gy
Reference Point Dosage Given to Date: 19.8 Gy
Reference Point Session Dosage Given: 1.8 Gy
Session Number: 11

## 2022-10-24 ENCOUNTER — Inpatient Hospital Stay (HOSPITAL_BASED_OUTPATIENT_CLINIC_OR_DEPARTMENT_OTHER): Payer: Medicaid Other | Admitting: Hematology

## 2022-10-24 ENCOUNTER — Other Ambulatory Visit: Payer: Self-pay

## 2022-10-24 ENCOUNTER — Inpatient Hospital Stay: Payer: Medicaid Other

## 2022-10-24 ENCOUNTER — Ambulatory Visit
Admission: RE | Admit: 2022-10-24 | Discharge: 2022-10-24 | Disposition: A | Discharge status: Home / Self Care | Payer: Medicaid Other | Source: Ambulatory Visit | Attending: Radiation Oncology | Admitting: Radiation Oncology

## 2022-10-24 VITALS — BP 109/83 | HR 69 | Temp 99.0°F | Resp 17 | Ht 66.0 in | Wt 152.0 lb

## 2022-10-24 DIAGNOSIS — D702 Other drug-induced agranulocytosis: Secondary | ICD-10-CM | POA: Insufficient documentation

## 2022-10-24 DIAGNOSIS — Z510 Encounter for antineoplastic radiation therapy: Secondary | ICD-10-CM | POA: Diagnosis present

## 2022-10-24 DIAGNOSIS — T451X5A Adverse effect of antineoplastic and immunosuppressive drugs, initial encounter: Secondary | ICD-10-CM | POA: Insufficient documentation

## 2022-10-24 DIAGNOSIS — C187 Malignant neoplasm of sigmoid colon: Secondary | ICD-10-CM | POA: Insufficient documentation

## 2022-10-24 DIAGNOSIS — D6481 Anemia due to antineoplastic chemotherapy: Secondary | ICD-10-CM | POA: Insufficient documentation

## 2022-10-24 DIAGNOSIS — Z800 Family history of malignant neoplasm of digestive organs: Secondary | ICD-10-CM | POA: Insufficient documentation

## 2022-10-24 DIAGNOSIS — Z803 Family history of malignant neoplasm of breast: Secondary | ICD-10-CM | POA: Insufficient documentation

## 2022-10-24 DIAGNOSIS — R21 Rash and other nonspecific skin eruption: Secondary | ICD-10-CM | POA: Insufficient documentation

## 2022-10-24 DIAGNOSIS — C7989 Secondary malignant neoplasm of other specified sites: Secondary | ICD-10-CM | POA: Insufficient documentation

## 2022-10-24 LAB — CMP (CANCER CENTER ONLY)
ALT: 8 U/L (ref 0–44)
AST: 17 U/L (ref 15–41)
Albumin: 4.1 g/dL (ref 3.5–5.0)
Alkaline Phosphatase: 50 U/L (ref 38–126)
Anion gap: 6 (ref 5–15)
BUN: 14 mg/dL (ref 6–20)
CO2: 26 mmol/L (ref 22–32)
Calcium: 9.5 mg/dL (ref 8.9–10.3)
Chloride: 110 mmol/L (ref 98–111)
Creatinine: 0.79 mg/dL (ref 0.44–1.00)
GFR, Estimated: >60 mL/min (ref >60)
Glucose, Bld: 87 mg/dL (ref 70–99)
Potassium: 3.9 mmol/L (ref 3.5–5.1)
Sodium: 142 mmol/L (ref 135–145)
Total Bilirubin: 0.4 mg/dL (ref 0.3–1.2)
Total Protein: 7.7 g/dL (ref 6.5–8.1)

## 2022-10-24 LAB — CBC WITH DIFFERENTIAL (CANCER CENTER ONLY)
Abs Immature Granulocytes: 0.01 10*3/uL (ref 0.00–0.07)
Basophils Absolute: 0 10*3/uL (ref 0.0–0.1)
Basophils Relative: 1 %
Eosinophils Absolute: 0.2 10*3/uL (ref 0.0–0.5)
Eosinophils Relative: 6 %
HCT: 35.8 % — ABNORMAL LOW (ref 36.0–46.0)
Hemoglobin: 11.3 g/dL — ABNORMAL LOW (ref 12.0–15.0)
Immature Granulocytes: 0 %
Lymphocytes Relative: 22 %
Lymphs Abs: 0.5 10*3/uL — ABNORMAL LOW (ref 0.7–4.0)
MCH: 25.7 pg — ABNORMAL LOW (ref 26.0–34.0)
MCHC: 31.6 g/dL (ref 30.0–36.0)
MCV: 81.5 fL (ref 80.0–100.0)
Monocytes Absolute: 0.3 10*3/uL (ref 0.1–1.0)
Monocytes Relative: 11 %
Neutro Abs: 1.4 10*3/uL — ABNORMAL LOW (ref 1.7–7.7)
Neutrophils Relative %: 60 %
Platelet Count: 189 10*3/uL (ref 150–400)
RBC: 4.39 MIL/uL (ref 3.87–5.11)
RDW: 14.9 % (ref 11.5–15.5)
WBC Count: 2.4 10*3/uL — ABNORMAL LOW (ref 4.0–10.5)
nRBC: 0 % (ref 0.0–0.2)

## 2022-10-24 LAB — CEA (ACCESS): CEA (CHCC): 6.67 ng/mL — ABNORMAL HIGH (ref 0.00–5.00)

## 2022-10-24 LAB — RAD ONC ARIA SESSION SUMMARY
Course Elapsed Days: 15
Plan Fractions Treated to Date: 12
Plan Prescribed Dose Per Fraction: 1.8 Gy
Plan Total Fractions Prescribed: 25
Plan Total Prescribed Dose: 45 Gy
Reference Point Dosage Given to Date: 21.6 Gy
Reference Point Session Dosage Given: 1.8 Gy
Session Number: 12

## 2022-10-24 NOTE — Assessment & Plan Note (Signed)
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 [name]. 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 Margins and lymph nodes negative. -she received adjuvant CAPOX 03/14/21 - 05/23/21 to reduce her risk of recurrence. she had additional Xeloda alone to complete 6 months of treatment.  -surveillance colonoscopy on 06/21/21 with Dr. Leonides Schanz was unremarkable. Plan for repeat in 1 year. -restaging CT AP on 08/11/21 showed: a soft tissue nodule favored to be related to left ovary, but adenopathy not completely excluded. -Due to rising CEA, we repeated CT on 08/17/2022 which showed enlarging rounded lesion along the LEFT iliac vessels, now measuring 4.2cm, concerning for cancer recurrence. -PET from 09/05/22 showed Intensely hypermetabolic mass in the LEFT pelvis is consistent with metastatic lymphadenopathy or local recurrence of colorectal carcinoma. -pt was referred to Dr. Glennis Brink at [facility] and she recommended neoadjuvant RT, I recommend with concurrent Xeloda. She started on 10/10/2022.

## 2022-10-24 NOTE — Progress Notes (Signed)
[facility]   [phone] [phone]   Clinic Follow up Note   Patient Care Team: Caudle, Shelton Silvas, FNP as PCP - General (Family Medicine) Luretha Murphy, MD as Consulting Physician (General Surgery) Kathi Der, MD as Consulting Physician (Gastroenterology) Malachy Mood, MD as Consulting Physician (Hematology) Allie Bossier, MD as Consulting Physician (Obstetrics and Gynecology)  Date of Service:  10/24/2022  CHIEF COMPLAINT: f/u of metastatic colon cancer  CURRENT THERAPY:  Concurrent chemoradiation with Xeloda  Oncology History   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 [name]. 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 Margins and lymph nodes negative. -she received adjuvant CAPOX 03/14/21 - 05/23/21 to reduce her risk of recurrence. she had additional Xeloda alone to complete 6 months of treatment.  -surveillance colonoscopy on 06/21/21 with Dr. Leonides Schanz was unremarkable. Plan for repeat in 1 year. -restaging CT AP on 08/11/21 showed: a soft tissue nodule favored to be related to left ovary, but adenopathy not completely excluded. -Due to rising CEA, we repeated CT on 08/17/2022 which showed enlarging rounded lesion along the LEFT iliac vessels, now measuring 4.2cm, concerning for cancer recurrence. -PET from 09/05/22 showed Intensely hypermetabolic mass in the LEFT pelvis is consistent with metastatic lymphadenopathy or local recurrence of colorectal carcinoma. -pt was referred to Dr. Glennis Brink at [facility] and she recommended neoadjuvant RT, I recommend with concurrent Xeloda. She started on 10/10/2022.     Assessment and Plan    Metastatic Colon Cancer Undergoing concurrent chemoradiation therapy with tolerable side effects. Loose, watery stools 3 times a day. Maintaining  good oral hydration and nutrition. On Capecitabine 1500mg  twice a day on the day of radiation. Blood counts slightly low, likely secondary to chemotherapy. -Continue current radiation and chemotherapy regimen. -Encourage high protein diet and adequate caloric intake to prevent weight loss. -Check blood counts on 10/31/2022 to monitor for further decline.  Neutropenia and anemia Secondary to chemotherapy, overall mild Will monitor closely  Post-Radiation Surgical Planning Last radiation treatment scheduled for 11/15/2022. Discussed typical waiting period of 6 weeks to 2-3 months post-radiation before surgical intervention to assess tumor shrinkage. -Plan for MRI or CT scan post-radiation to assess tumor response. -Reach out to surgeon towards the end of radiation treatment to discuss surgical planning.  Lab and Follow-up in 2 weeks with additional lab work scheduled for 10/31/2022.         SUMMARY OF ONCOLOGIC HISTORY: Oncology History Overview Note   Cancer Staging  Cancer of sigmoid colon [facility]) Staging form: Colon and Rectum, AJCC 8th Edition - Pathologic stage from 02/10/2021: Stage IIB (pT4a, pN0, cM0) - Signed by Malachy Mood, MD on 02/28/2021    Cancer of sigmoid colon (HCC)  11/27/2020 Imaging   CLINICAL DATA:  Abdominal pain that is began Monday. Patient reports seen scant amounts of dark blood in stool. Feels bloated.   EXAM: CT ABDOMEN AND PELVIS WITH CONTRAST  IMPRESSION: 1. Proctocolitis involving the mid and distal sigmoid colon and upper rectum with a 3.9 cm gas containing interloop abscess centered in the sigmoid mesocolon. 2. Severe fecal burden.  No findings of bowel obstruction. 3. An enlarged left common iliac lymph node is likely reactive. 4. A 2.7 cm hemangioma is noted in the right hepatic lobe. 5. Mild focal bronchiolitis in the right lower lobe.   02/07/2021 Imaging   CLINICAL DATA:  Abdominal pain,  cramping, constipation   EXAM: CT ABDOMEN AND PELVIS WITH  CONTRAST  IMPRESSION: 1. There is severe, circumferential wall thickening and mucosal hyperenhancement involving the distal sigmoid colon and rectum to the anus, generally tethered appearance unchanged, involving at least 15-20 cm of distal sigmoid and rectum. 2. There is again seen an abscess at the medial aspect of the rectosigmoid junction measuring approximately 3.0 x 1.6 cm. 3. Findings are generally consistent with nonspecific infectious, inflammatory, or ischemic colitis, including inflammatory bowel disease such as Crohn's disease or ulcerative colitis; however an underlying malignancy is very difficult to exclude, particularly given substantial wall thickening. 4. Large burden of stool throughout the extremely redundant proximal colon, likely reflecting some degree of obstipation. 5. Enlarged left external iliac lymph nodes measuring up to 1.1 x 0.9 cm fall, nonspecific, possibly reactive, however nodal metastatic disease is not excluded per above.   02/09/2021 Initial Biopsy   FINAL MICROSCOPIC DIAGNOSIS:   A. COLON MASS, SIGMOID, BIOPSY:  - Adenomatous lesion with extensive high-grade dysplasia, see comment   B. RECTAL, COLON, SIGMOID, BIOPSY:  - Mild acute/ subacute nonspecific colitis, see comment  - Negative for definite features of chronicity, granulomas or dysplasia   COMMENT:  A.  Evidence of invasive carcinoma is not seen in the submitted biopsies but the biopsy fragments appear superficial and a more severe deeper process cannot be ruled out.  B.  Histologic features in the rectosigmoid biopsies are not compatible with untreated inflammatory bowel disease.  Differential diagnosis can include infection, drug-effect and stercoral proctitis among other possibilities.    02/09/2021 Procedure   Colonoscopy, Dr. Levora Angel  Impression: - Preparation of the colon was fair. - Likely malignant partially obstructing tumor in the distal sigmoid colon. Biopsied. Tattooed. -  Congested, inflamed and thickened folds of the mucosa in the recto-sigmoid colon. Biopsied. Tattooed.   02/10/2021 Cancer Staging   Staging form: Colon and Rectum, AJCC 8th Edition - Pathologic stage from 02/10/2021: Stage IIB (pT4a, pN0, cM0) - Signed by Malachy Mood, MD on 02/28/2021 Stage prefix: Initial diagnosis Total positive nodes: 0 Histologic grading system: 4 grade system Histologic grade (G): G2 Residual tumor (R): R0 - None   02/10/2021 Definitive Surgery   FINAL MICROSCOPIC DIAGNOSIS:   A. COLON, SIGMOID:  - Invasive moderately differentiated adenocarcinoma.  - Twenty-seven lymph nodes, negative for metastatic carcinoma (0/27).  - See oncology table below.   B. COLON, DISTAL, SIGMOID:  - Segment of colon with serosal adhesions, acute inflammation, and tattoo pigment.  - Negative for malignancy.    02/11/2021 Initial Diagnosis   Cancer of sigmoid colon (HCC)   03/14/2021 - 05/23/2021 Chemotherapy   Patient is on Treatment Plan : COLORECTAL Xelox (Capeox) q21d     04/20/2021 Genetic Testing   Negative hereditary cancer genetic testing: no pathogenic variants detected in [employer] CustomNext-Cancer +RNAinsight Panel.  Report date is April 20, 2021.   The CustomNext-Cancer+RNAinsight panel offered by [employer] includes sequencing and rearrangement analysis for the following 47 genes:  APC, ATM, AXIN2, BARD1, BMPR1A, BRCA1, BRCA2, BRIP1, CDH1, CDK4, CDKN2A, CHEK2, DICER1, EPCAM, GREM1, HOXB13, MEN1, MLH1, MSH2, MSH3, MSH6, MUTYH, NBN, NF1, NF2, NTHL1, PALB2, PMS2, POLD1, POLE, PTEN, RAD51C, RAD51D, RECQL, RET, SDHA, SDHAF2, SDHB, SDHC, SDHD, SMAD4, SMARCA4, STK11, TP53, TSC1, TSC2, and VHL.  RNA data is routinely analyzed for use in variant interpretation for all genes.    02/20/2022 Imaging    IMPRESSION: 1. Similar postsurgical changes of partial sigmoidectomy with Hartmann's pouch formation and left anterior abdominal  wall colostomy. No noncontrast CT evidence of local  recurrence. New nonobstructive bowel/fluid containing peristomal hernia. 2. No convincing evidence of metastatic disease within the chest, abdomen, or pelvis on this noncontrast enhanced examination. 3. New multifocal subpleural foci of nodularity correspond with regions of atelectasis seen on prior imaging, favored to reflect subsegmental atelectasis. Stable 3 mm right lower lobe pulmonary nodule, favored benign. Consider attention on dedicated short-term interval follow-up chest CT. 4. Stable soft tissue along the left iliac vessels again favored to reflect left ovary given its continuity with the gonadal vessels but difficult to evaluate given lack of intravenous contrast material. Correlation with CEA levels and continued attention on follow-up imaging suggested. 5. Moderate volume of formed stool throughout the colon. Correlate for constipation.      Discussed the use of AI scribe software for clinical note transcription with the patient, who gave verbal consent to proceed.  History of Present Illness   The patient, a 47 year old female with metastatic colon cancer, presents for follow-up after initiating radiation therapy. She reports tolerating the treatment well, with the primary side effect being increased output in her colostomy bag, described as 'loose, watery, kind of mushy' stool. She empties the bag approximately three times daily.  In response to these changes, she has increased her oral fluid intake, consuming water, Gatorade, Pedialyte, and Liquid IV for electrolyte replenishment. She reports maintaining a good appetite, consuming a diet rich in vegetables and chicken, but acknowledges she may not be eating enough protein. She has lost about three pounds since her last visit.  The patient is also taking capecitabine, a chemotherapy medication, on the days of radiation treatment. She denies any nausea or other side effects from this medication.  She has a history of  prediabetes and is mindful of her fruit and carbohydrate intake to manage this condition. She expresses concern about maintaining her energy levels and weight during radiation therapy.  The patient's blood counts are slightly low, likely due to the chemotherapy. She is scheduled to complete radiation therapy on October 23rd, after which she will have a waiting period to assess the tumor's response to treatment.         All other systems were reviewed with the patient and are negative.  MEDICAL HISTORY:  Past Medical History:  Diagnosis Date   Anemia    Anxiety    Colon cancer (HCC) 01/2021   stage 2   Complication of anesthesia    took a long time to wake up after surgery   Family history of breast cancer 03/30/2021   Family history of colon cancer 03/30/2021    SURGICAL HISTORY: Past Surgical History:  Procedure Laterality Date   ABDOMINAL HYSTERECTOMY Bilateral 09/26/2016   Procedure: HYSTERECTOMY ABDOMINAL W/ BILATERAL SALPINGECTOMY;  Surgeon: Allie Bossier, MD;  Location: [facility];  Service: Gynecology;  Laterality: Bilateral;   BIOPSY  02/09/2021   Procedure: BIOPSY;  Surgeon: Kathi Der, MD;  Location: [facility];  Service: Gastroenterology;;   BREAST BIOPSY Left    COLONOSCOPY N/A 02/09/2021   Procedure: COLONOSCOPY;  Surgeon: Kathi Der, MD;  Location: [facility];  Service: Gastroenterology;  Laterality: N/A;   LAPAROSCOPIC PARTIAL COLECTOMY N/A 02/10/2021   Procedure: ROBOTIC ASSISTED PARTIAL COLECTOMY WITH COLOSTOMY;  Surgeon: Luretha Murphy, MD;  Location: [facility];  Service: General;  Laterality: N/A;   SUBMUCOSAL TATTOO INJECTION  02/09/2021   Procedure: SUBMUCOSAL TATTOO INJECTION;  Surgeon: Kathi Der, MD;  Location: [facility];  Service: Gastroenterology;;   TUBAL LIGATION  I have reviewed the social history and family history with the patient and they are unchanged from previous note.  ALLERGIES:  is allergic to ferrlecit [na ferric gluc  cplx in sucrose].  MEDICATIONS:  Current Outpatient Medications  Medication Sig Dispense Refill   acetaminophen (TYLENOL) 500 MG tablet Take 2 tablets (1,000 mg total) by mouth every 6 (six) hours as needed for mild pain. 30 tablet 0   capecitabine (XELODA) 500 MG tablet Take 3 tablets (1,500 mg total) by mouth 2 (two) times daily after a meal. Take the days of radiation, Monday through Friday, off on weekends 90 tablet 1   metroNIDAZOLE (FLAGYL) 500 MG tablet Take 1 tablet (500 mg total) by mouth 2 (two) times daily. (Patient not taking: Reported on 09/26/2022) 14 tablet 0   ondansetron (ZOFRAN) 8 MG tablet Take 1 tablet (8 mg total) by mouth every 8 (eight) hours as needed for refractory nausea / vomiting. Start on day 3 after chemotherapy. 30 tablet 1   pantoprazole (PROTONIX) 40 MG tablet Take 40 mg by mouth daily as needed. (Patient not taking: Reported on 09/26/2022)     polyethylene glycol (MIRALAX / GLYCOLAX) 17 g packet Take 17 g by mouth daily. (Patient not taking: Reported on 09/26/2022) 14 each 0   No current facility-administered medications for this visit.    PHYSICAL EXAMINATION: ECOG PERFORMANCE STATUS: 1 - Symptomatic but completely ambulatory  Vitals:   10/24/22 0851  BP: 109/83  Pulse: 69  Resp: 17  Temp: 99 F (37.2 C)  SpO2: 100%   Wt Readings from Last 3 Encounters:  10/24/22 152 lb (68.9 kg)  10/10/22 154 lb 9.6 oz (70.1 kg)  09/26/22 152 lb 3.2 oz (69 kg)     GENERAL:alert, no distress and comfortable SKIN: skin color, texture, turgor are normal, no rashes or significant lesions EYES: normal, Conjunctiva are pink and non-injected, sclera clear Musculoskeletal:no cyanosis of digits and no clubbing  NEURO: alert & oriented x 3 with fluent speech, no focal motor/sensory deficits   LABORATORY DATA:  I have reviewed the data as listed    Latest Ref Rng & Units 10/24/2022    8:34 AM 10/10/2022    9:15 AM 08/09/2022    8:46 AM  CBC  WBC 4.0 - 10.5 K/uL 2.4  3.9   4.5   Hemoglobin 12.0 - 15.0 g/dL 14.7  82.9  56.2   Hematocrit 36.0 - 46.0 % 35.8  39.1  35.7   Platelets 150 - 400 K/uL 189  239  250         Latest Ref Rng & Units 10/10/2022    9:15 AM 08/09/2022    8:46 AM 07/25/2022    8:18 AM  CMP  Glucose 70 - 99 mg/dL 88  87  86   BUN 6 - 20 mg/dL 13  14  9    Creatinine 0.44 - 1.00 mg/dL 1.30  8.65  7.84   Sodium 135 - 145 mmol/L 140  144  143   Potassium 3.5 - 5.1 mmol/L 3.9  4.3  4.0   Chloride 98 - 111 mmol/L 107  104  108   CO2 22 - 32 mmol/L 26  24  28    Calcium 8.9 - 10.3 mg/dL 9.8  69.6  9.2   Total Protein 6.5 - 8.1 g/dL 8.5  7.8  7.6   Total Bilirubin 0.3 - 1.2 mg/dL 0.4  0.3  0.4   Alkaline Phos 38 - 126 U/L 57  75  66   AST 15 - 41 U/L 18  22  19    ALT 0 - 44 U/L 8  9  7        RADIOGRAPHIC STUDIES: I have personally reviewed the radiological images as listed and agreed with the findings in the report. No results found.    No orders of the defined types were placed in this encounter.  All questions were answered. The patient knows to call the clinic with any problems, questions or concerns. No barriers to learning was detected. The total time spent in the appointment was 25 minutes.     Malachy Mood, MD 10/24/2022

## 2022-10-25 ENCOUNTER — Other Ambulatory Visit: Payer: Self-pay

## 2022-10-25 ENCOUNTER — Ambulatory Visit
Admission: RE | Admit: 2022-10-25 | Discharge: 2022-10-25 | Disposition: A | Discharge status: Home / Self Care | Payer: Medicaid Other | Source: Ambulatory Visit | Attending: Radiation Oncology

## 2022-10-25 DIAGNOSIS — Z510 Encounter for antineoplastic radiation therapy: Secondary | ICD-10-CM | POA: Diagnosis not present

## 2022-10-25 LAB — RAD ONC ARIA SESSION SUMMARY
Course Elapsed Days: 16
Plan Fractions Treated to Date: 13
Plan Prescribed Dose Per Fraction: 1.8 Gy
Plan Total Fractions Prescribed: 25
Plan Total Prescribed Dose: 45 Gy
Reference Point Dosage Given to Date: 23.4 Gy
Reference Point Session Dosage Given: 1.8 Gy
Session Number: 13

## 2022-10-26 ENCOUNTER — Ambulatory Visit
Admission: RE | Admit: 2022-10-26 | Discharge: 2022-10-26 | Disposition: A | Discharge status: Home / Self Care | Payer: Medicaid Other | Source: Ambulatory Visit | Attending: Radiation Oncology | Admitting: Radiation Oncology

## 2022-10-26 ENCOUNTER — Other Ambulatory Visit: Payer: Self-pay

## 2022-10-26 DIAGNOSIS — Z510 Encounter for antineoplastic radiation therapy: Secondary | ICD-10-CM | POA: Diagnosis not present

## 2022-10-26 LAB — RAD ONC ARIA SESSION SUMMARY
Course Elapsed Days: 17
Plan Fractions Treated to Date: 14
Plan Prescribed Dose Per Fraction: 1.8 Gy
Plan Total Fractions Prescribed: 25
Plan Total Prescribed Dose: 45 Gy
Reference Point Dosage Given to Date: 25.2 Gy
Reference Point Session Dosage Given: 1.8 Gy
Session Number: 14

## 2022-10-27 ENCOUNTER — Other Ambulatory Visit: Payer: Self-pay

## 2022-10-27 ENCOUNTER — Ambulatory Visit
Admission: RE | Admit: 2022-10-27 | Discharge: 2022-10-27 | Disposition: A | Discharge status: Home / Self Care | Payer: Medicaid Other | Source: Ambulatory Visit | Attending: Radiation Oncology

## 2022-10-27 DIAGNOSIS — Z510 Encounter for antineoplastic radiation therapy: Secondary | ICD-10-CM | POA: Diagnosis not present

## 2022-10-27 LAB — RAD ONC ARIA SESSION SUMMARY
Course Elapsed Days: 18
Plan Fractions Treated to Date: 15
Plan Prescribed Dose Per Fraction: 1.8 Gy
Plan Total Fractions Prescribed: 25
Plan Total Prescribed Dose: 45 Gy
Reference Point Dosage Given to Date: 27 Gy
Reference Point Session Dosage Given: 1.8 Gy
Session Number: 15

## 2022-10-30 ENCOUNTER — Ambulatory Visit
Admission: RE | Admit: 2022-10-30 | Discharge: 2022-10-30 | Disposition: A | Discharge status: Home / Self Care | Payer: Medicaid Other | Source: Ambulatory Visit | Attending: Radiation Oncology | Admitting: Radiation Oncology

## 2022-10-30 ENCOUNTER — Other Ambulatory Visit: Payer: Self-pay

## 2022-10-30 DIAGNOSIS — Z510 Encounter for antineoplastic radiation therapy: Secondary | ICD-10-CM | POA: Diagnosis not present

## 2022-10-30 LAB — RAD ONC ARIA SESSION SUMMARY
Course Elapsed Days: 21
Plan Fractions Treated to Date: 16
Plan Prescribed Dose Per Fraction: 1.8 Gy
Plan Total Fractions Prescribed: 25
Plan Total Prescribed Dose: 45 Gy
Reference Point Dosage Given to Date: 28.8 Gy
Reference Point Session Dosage Given: 1.8 Gy
Session Number: 16

## 2022-10-30 NOTE — Assessment & Plan Note (Signed)
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 [name]. 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 Margins and lymph nodes negative. -she received adjuvant CAPOX 03/14/21 - 05/23/21 to reduce her risk of recurrence. she had additional Xeloda alone to complete 6 months of treatment.  -surveillance colonoscopy on 06/21/21 with Dr. Leonides Schanz was unremarkable. Plan for repeat in 1 year. -restaging CT AP on 08/11/21 showed: a soft tissue nodule favored to be related to left ovary, but adenopathy not completely excluded. -Due to rising CEA, we repeated CT on 08/17/2022 which showed enlarging rounded lesion along the LEFT iliac vessels, now measuring 4.2cm, concerning for cancer recurrence. -PET from 09/05/22 showed Intensely hypermetabolic mass in the LEFT pelvis is consistent with metastatic lymphadenopathy or local recurrence of colorectal carcinoma. -pt was referred to Dr. Glennis Brink at [facility] and she recommended neoadjuvant RT, I recommend with concurrent Xeloda. She started on 10/10/2022. She is tolerating well.

## 2022-10-31 ENCOUNTER — Other Ambulatory Visit: Payer: Self-pay

## 2022-10-31 ENCOUNTER — Inpatient Hospital Stay: Payer: Medicaid Other

## 2022-10-31 ENCOUNTER — Ambulatory Visit
Admission: RE | Admit: 2022-10-31 | Discharge: 2022-10-31 | Disposition: A | Discharge status: Home / Self Care | Payer: Medicaid Other | Source: Ambulatory Visit | Attending: Radiation Oncology

## 2022-10-31 ENCOUNTER — Inpatient Hospital Stay (HOSPITAL_BASED_OUTPATIENT_CLINIC_OR_DEPARTMENT_OTHER): Payer: Medicaid Other | Admitting: Hematology

## 2022-10-31 VITALS — BP 112/81 | HR 73 | Temp 98.3°F | Resp 18 | Ht 66.0 in | Wt 151.9 lb

## 2022-10-31 DIAGNOSIS — C187 Malignant neoplasm of sigmoid colon: Secondary | ICD-10-CM | POA: Diagnosis not present

## 2022-10-31 DIAGNOSIS — Z510 Encounter for antineoplastic radiation therapy: Secondary | ICD-10-CM | POA: Diagnosis not present

## 2022-10-31 LAB — RAD ONC ARIA SESSION SUMMARY
Course Elapsed Days: 22
Plan Fractions Treated to Date: 17
Plan Prescribed Dose Per Fraction: 1.8 Gy
Plan Total Fractions Prescribed: 25
Plan Total Prescribed Dose: 45 Gy
Reference Point Dosage Given to Date: 30.6 Gy
Reference Point Session Dosage Given: 1.8 Gy
Session Number: 17

## 2022-10-31 LAB — CBC WITH DIFFERENTIAL (CANCER CENTER ONLY)
Abs Immature Granulocytes: 0.01 10*3/uL (ref 0.00–0.07)
Basophils Absolute: 0 10*3/uL (ref 0.0–0.1)
Basophils Relative: 0 %
Eosinophils Absolute: 0.2 10*3/uL (ref 0.0–0.5)
Eosinophils Relative: 9 %
HCT: 36.5 % (ref 36.0–46.0)
Hemoglobin: 11.3 g/dL — ABNORMAL LOW (ref 12.0–15.0)
Immature Granulocytes: 0 %
Lymphocytes Relative: 14 %
Lymphs Abs: 0.3 10*3/uL — ABNORMAL LOW (ref 0.7–4.0)
MCH: 25.5 pg — ABNORMAL LOW (ref 26.0–34.0)
MCHC: 31 g/dL (ref 30.0–36.0)
MCV: 82.2 fL (ref 80.0–100.0)
Monocytes Absolute: 0.2 10*3/uL (ref 0.1–1.0)
Monocytes Relative: 11 %
Neutro Abs: 1.5 10*3/uL — ABNORMAL LOW (ref 1.7–7.7)
Neutrophils Relative %: 66 %
Platelet Count: 171 10*3/uL (ref 150–400)
RBC: 4.44 MIL/uL (ref 3.87–5.11)
RDW: 15.4 % (ref 11.5–15.5)
WBC Count: 2.3 10*3/uL — ABNORMAL LOW (ref 4.0–10.5)
nRBC: 0 % (ref 0.0–0.2)

## 2022-10-31 LAB — CMP (CANCER CENTER ONLY)
ALT: 9 U/L (ref 0–44)
AST: 18 U/L (ref 15–41)
Albumin: 4.2 g/dL (ref 3.5–5.0)
Alkaline Phosphatase: 52 U/L (ref 38–126)
Anion gap: 5 (ref 5–15)
BUN: 12 mg/dL (ref 6–20)
CO2: 28 mmol/L (ref 22–32)
Calcium: 9.8 mg/dL (ref 8.9–10.3)
Chloride: 108 mmol/L (ref 98–111)
Creatinine: 0.79 mg/dL (ref 0.44–1.00)
GFR, Estimated: >60 mL/min (ref >60)
Glucose, Bld: 89 mg/dL (ref 70–99)
Potassium: 4.1 mmol/L (ref 3.5–5.1)
Sodium: 141 mmol/L (ref 135–145)
Total Bilirubin: 0.5 mg/dL (ref 0.3–1.2)
Total Protein: 7.9 g/dL (ref 6.5–8.1)

## 2022-10-31 LAB — CEA (ACCESS): CEA (CHCC): 4.05 ng/mL (ref 0.00–5.00)

## 2022-10-31 NOTE — Progress Notes (Signed)
[facility]   [phone] [phone]   Clinic Follow up Note   Patient Care Team: Caudle, Shelton Silvas, FNP as PCP - General (Family Medicine) Luretha Murphy, MD as Consulting Physician (General Surgery) Kathi Der, MD as Consulting Physician (Gastroenterology) Malachy Mood, MD as Consulting Physician (Hematology) Allie Bossier, MD as Consulting Physician (Obstetrics and Gynecology)  Date of Service:  10/31/2022  CHIEF COMPLAINT: f/u of recurrent sigmoid colon cancer  CURRENT THERAPY:  Concurrent chemoradiation with Xeloda  Oncology History   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 [name]. 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 Margins and lymph nodes negative. -she received adjuvant CAPOX 03/14/21 - 05/23/21 to reduce her risk of recurrence. she had additional Xeloda alone to complete 6 months of treatment.  -surveillance colonoscopy on 06/21/21 with Dr. Leonides Schanz was unremarkable. Plan for repeat in 1 year. -restaging CT AP on 08/11/21 showed: a soft tissue nodule favored to be related to left ovary, but adenopathy not completely excluded. -Due to rising CEA, we repeated CT on 08/17/2022 which showed enlarging rounded lesion along the LEFT iliac vessels, now measuring 4.2cm, concerning for cancer recurrence. -PET from 09/05/22 showed Intensely hypermetabolic mass in the LEFT pelvis is consistent with metastatic lymphadenopathy or local recurrence of colorectal carcinoma. -pt was referred to Dr. Glennis Brink at [facility] and she recommended neoadjuvant RT, I recommend with concurrent Xeloda. She started on 10/10/2022. She is tolerating well.   Assessment and Plan    Metastatic Colon Cancer Undergoing concurrent chemo and radiation therapy. Noted skin discoloration on hands and feet, no  peeling or cracking. Stable weight, normal bowel movements, no fever, chills, or mouth sores. Mild mucus discharge. Tumor marker decreased from 30 to 6.67 indicating good response to treatment. -Continue current treatment regimen.  She is tolerating very well overall. -Check labs on 11/14/2022. -Plan for surgery on 12/29/2022, pending surgeon's assessment and potential pre-operative imaging.  Mild Leukopenia Likely secondary to chemotherapy. No associated symptoms. -Monitor with repeat labs on 11/14/2022. -Encourage diet rich in fruits and vegetables.  Follow-up Scheduled for 11/14/2022 to assess response to treatment and contact Dr Glennis Brink to see if she needs repeated imaging before surgery        SUMMARY OF ONCOLOGIC HISTORY: Oncology History Overview Note   Cancer Staging  Cancer of sigmoid colon [facility]) Staging form: Colon and Rectum, AJCC 8th Edition - Pathologic stage from 02/10/2021: Stage IIB (pT4a, pN0, cM0) - Signed by Malachy Mood, MD on 02/28/2021    Cancer of sigmoid colon (HCC)  11/27/2020 Imaging   CLINICAL DATA:  Abdominal pain that is began Monday. Patient reports seen scant amounts of dark blood in stool. Feels bloated.   EXAM: CT ABDOMEN AND PELVIS WITH CONTRAST  IMPRESSION: 1. Proctocolitis involving the mid and distal sigmoid colon and upper rectum with a 3.9 cm gas containing interloop abscess centered in the sigmoid mesocolon. 2. Severe fecal burden.  No findings of bowel obstruction. 3. An enlarged left common iliac lymph node is likely reactive. 4. A 2.7 cm hemangioma is noted in the right hepatic lobe. 5. Mild focal bronchiolitis in the right lower lobe.   02/07/2021 Imaging   CLINICAL DATA:  Abdominal pain, cramping, constipation   EXAM: CT ABDOMEN AND PELVIS WITH CONTRAST  IMPRESSION: 1. There is severe, circumferential wall thickening and mucosal hyperenhancement involving the distal sigmoid colon and  rectum to the anus, generally tethered  appearance unchanged, involving at least 15-20 cm of distal sigmoid and rectum. 2. There is again seen an abscess at the medial aspect of the rectosigmoid junction measuring approximately 3.0 x 1.6 cm. 3. Findings are generally consistent with nonspecific infectious, inflammatory, or ischemic colitis, including inflammatory bowel disease such as Crohn's disease or ulcerative colitis; however an underlying malignancy is very difficult to exclude, particularly given substantial wall thickening. 4. Large burden of stool throughout the extremely redundant proximal colon, likely reflecting some degree of obstipation. 5. Enlarged left external iliac lymph nodes measuring up to 1.1 x 0.9 cm fall, nonspecific, possibly reactive, however nodal metastatic disease is not excluded per above.   02/09/2021 Initial Biopsy   FINAL MICROSCOPIC DIAGNOSIS:   A. COLON MASS, SIGMOID, BIOPSY:  - Adenomatous lesion with extensive high-grade dysplasia, see comment   B. RECTAL, COLON, SIGMOID, BIOPSY:  - Mild acute/ subacute nonspecific colitis, see comment  - Negative for definite features of chronicity, granulomas or dysplasia   COMMENT:  A.  Evidence of invasive carcinoma is not seen in the submitted biopsies but the biopsy fragments appear superficial and a more severe deeper process cannot be ruled out.  B.  Histologic features in the rectosigmoid biopsies are not compatible with untreated inflammatory bowel disease.  Differential diagnosis can include infection, drug-effect and stercoral proctitis among other possibilities.    02/09/2021 Procedure   Colonoscopy, Dr. Levora Angel  Impression: - Preparation of the colon was fair. - Likely malignant partially obstructing tumor in the distal sigmoid colon. Biopsied. Tattooed. - Congested, inflamed and thickened folds of the mucosa in the recto-sigmoid colon. Biopsied. Tattooed.   02/10/2021 Cancer Staging   Staging form: Colon and Rectum, AJCC 8th Edition -  Pathologic stage from 02/10/2021: Stage IIB (pT4a, pN0, cM0) - Signed by Malachy Mood, MD on 02/28/2021 Stage prefix: Initial diagnosis Total positive nodes: 0 Histologic grading system: 4 grade system Histologic grade (G): G2 Residual tumor (R): R0 - None   02/10/2021 Definitive Surgery   FINAL MICROSCOPIC DIAGNOSIS:   A. COLON, SIGMOID:  - Invasive moderately differentiated adenocarcinoma.  - Twenty-seven lymph nodes, negative for metastatic carcinoma (0/27).  - See oncology table below.   B. COLON, DISTAL, SIGMOID:  - Segment of colon with serosal adhesions, acute inflammation, and tattoo pigment.  - Negative for malignancy.    02/11/2021 Initial Diagnosis   Cancer of sigmoid colon (HCC)   03/14/2021 - 05/23/2021 Chemotherapy   Patient is on Treatment Plan : COLORECTAL Xelox (Capeox) q21d     04/20/2021 Genetic Testing   Negative hereditary cancer genetic testing: no pathogenic variants detected in [employer] CustomNext-Cancer +RNAinsight Panel.  Report date is April 20, 2021.   The CustomNext-Cancer+RNAinsight panel offered by [employer] includes sequencing and rearrangement analysis for the following 47 genes:  APC, ATM, AXIN2, BARD1, BMPR1A, BRCA1, BRCA2, BRIP1, CDH1, CDK4, CDKN2A, CHEK2, DICER1, EPCAM, GREM1, HOXB13, MEN1, MLH1, MSH2, MSH3, MSH6, MUTYH, NBN, NF1, NF2, NTHL1, PALB2, PMS2, POLD1, POLE, PTEN, RAD51C, RAD51D, RECQL, RET, SDHA, SDHAF2, SDHB, SDHC, SDHD, SMAD4, SMARCA4, STK11, TP53, TSC1, TSC2, and VHL.  RNA data is routinely analyzed for use in variant interpretation for all genes.    02/20/2022 Imaging    IMPRESSION: 1. Similar postsurgical changes of partial sigmoidectomy with Hartmann's pouch formation and left anterior abdominal wall colostomy. No noncontrast CT evidence of local recurrence. New nonobstructive bowel/fluid containing peristomal hernia. 2. No convincing evidence of metastatic disease within the chest, abdomen, or pelvis on  this noncontrast enhanced  examination. 3. New multifocal subpleural foci of nodularity correspond with regions of atelectasis seen on prior imaging, favored to reflect subsegmental atelectasis. Stable 3 mm right lower lobe pulmonary nodule, favored benign. Consider attention on dedicated short-term interval follow-up chest CT. 4. Stable soft tissue along the left iliac vessels again favored to reflect left ovary given its continuity with the gonadal vessels but difficult to evaluate given lack of intravenous contrast material. Correlation with CEA levels and continued attention on follow-up imaging suggested. 5. Moderate volume of formed stool throughout the colon. Correlate for constipation.      Discussed the use of AI scribe software for clinical note transcription with the patient, who gave verbal consent to proceed.  History of Present Illness   The patient, a 47 year old female with metastatic colon cancer, is currently undergoing concurrent chemo and radiation therapy. She reports that her hands are turning black again due to the chemo pills, similar to a previous occurrence where her feet also turned black. However, there is no peeling or cracking of the skin. She also noticed a mild rash. She denies any pain and reports normal bowel movements. Her weight has remained stable and she denies any fever, chills, or mouth sores. She has been informed that the radiation may cause a rash in the pelvic area, but she has not experienced this yet. She has noticed a small amount of mucus discharge from the rectum. She also reports difficulty with blood draw due to low white blood cell count. She is scheduled for surgery on December 6th for the removal of the tumor, but expresses concerns about the possibility of multiple surgeries and the potential need for a temporary bag.         All other systems were reviewed with the patient and are negative.  MEDICAL HISTORY:  Past Medical History:  Diagnosis Date   Anemia     Anxiety    Colon cancer (HCC) 01/2021   stage 2   Complication of anesthesia    took a long time to wake up after surgery   Family history of breast cancer 03/30/2021   Family history of colon cancer 03/30/2021    SURGICAL HISTORY: Past Surgical History:  Procedure Laterality Date   ABDOMINAL HYSTERECTOMY Bilateral 09/26/2016   Procedure: HYSTERECTOMY ABDOMINAL W/ BILATERAL SALPINGECTOMY;  Surgeon: Allie Bossier, MD;  Location: [facility];  Service: Gynecology;  Laterality: Bilateral;   BIOPSY  02/09/2021   Procedure: BIOPSY;  Surgeon: Kathi Der, MD;  Location: [facility];  Service: Gastroenterology;;   BREAST BIOPSY Left    COLONOSCOPY N/A 02/09/2021   Procedure: COLONOSCOPY;  Surgeon: Kathi Der, MD;  Location: [facility];  Service: Gastroenterology;  Laterality: N/A;   LAPAROSCOPIC PARTIAL COLECTOMY N/A 02/10/2021   Procedure: ROBOTIC ASSISTED PARTIAL COLECTOMY WITH COLOSTOMY;  Surgeon: Luretha Murphy, MD;  Location: [facility];  Service: General;  Laterality: N/A;   SUBMUCOSAL TATTOO INJECTION  02/09/2021   Procedure: SUBMUCOSAL TATTOO INJECTION;  Surgeon: Kathi Der, MD;  Location: [facility];  Service: Gastroenterology;;   TUBAL LIGATION      I have reviewed the social history and family history with the patient and they are unchanged from previous note.  ALLERGIES:  is allergic to ferrlecit [na ferric gluc cplx in sucrose].  MEDICATIONS:  Current Outpatient Medications  Medication Sig Dispense Refill   acetaminophen (TYLENOL) 500 MG tablet Take 2 tablets (1,000 mg total) by mouth every 6 (six) hours as needed for mild pain. 30 tablet  0   capecitabine (XELODA) 500 MG tablet Take 3 tablets (1,500 mg total) by mouth 2 (two) times daily after a meal. Take the days of radiation, Monday through Friday, off on weekends 90 tablet 1   metroNIDAZOLE (FLAGYL) 500 MG tablet Take 1 tablet (500 mg total) by mouth 2 (two) times daily. (Patient not taking: Reported on  09/26/2022) 14 tablet 0   ondansetron (ZOFRAN) 8 MG tablet Take 1 tablet (8 mg total) by mouth every 8 (eight) hours as needed for refractory nausea / vomiting. Start on day 3 after chemotherapy. 30 tablet 1   pantoprazole (PROTONIX) 40 MG tablet Take 40 mg by mouth daily as needed. (Patient not taking: Reported on 09/26/2022)     polyethylene glycol (MIRALAX / GLYCOLAX) 17 g packet Take 17 g by mouth daily. (Patient not taking: Reported on 09/26/2022) 14 each 0   No current facility-administered medications for this visit.    PHYSICAL EXAMINATION: ECOG PERFORMANCE STATUS: 1 - Symptomatic but completely ambulatory  Vitals:   10/31/22 0935  BP: 112/81  Pulse: 73  Resp: 18  Temp: 98.3 F (36.8 C)  SpO2: 99%   Wt Readings from Last 3 Encounters:  10/31/22 151 lb 14.4 oz (68.9 kg)  10/24/22 152 lb (68.9 kg)  10/10/22 154 lb 9.6 oz (70.1 kg)     GENERAL:alert, no distress and comfortable SKIN: skin color, texture, turgor are normal, no rashes or significant lesions except hypopigmentation in hands EYES: normal, Conjunctiva are pink and non-injected, sclera clear NECK: supple, thyroid normal size, non-tender, without nodularity LYMPH:  no palpable lymphadenopathy in the cervical, axillary  LUNGS: clear to auscultation and percussion with normal breathing effort HEART: regular rate & rhythm and no murmurs and no lower extremity edema ABDOMEN:abdomen soft, non-tender and normal bowel sounds Musculoskeletal:no cyanosis of digits and no clubbing  NEURO: alert & oriented x 3 with fluent speech, no focal motor/sensory deficits   LABORATORY DATA:  I have reviewed the data as listed    Latest Ref Rng & Units 10/31/2022    9:14 AM 10/24/2022    8:34 AM 10/10/2022    9:15 AM  CBC  WBC 4.0 - 10.5 K/uL 2.3  2.4  3.9   Hemoglobin 12.0 - 15.0 g/dL 86.5  78.4  69.6   Hematocrit 36.0 - 46.0 % 36.5  35.8  39.1   Platelets 150 - 400 K/uL 171  189  239         Latest Ref Rng & Units 10/31/2022     9:14 AM 10/24/2022    8:34 AM 10/10/2022    9:15 AM  CMP  Glucose 70 - 99 mg/dL 89  87  88   BUN 6 - 20 mg/dL 12  14  13    Creatinine 0.44 - 1.00 mg/dL 2.95  2.84  1.32   Sodium 135 - 145 mmol/L 141  142  140   Potassium 3.5 - 5.1 mmol/L 4.1  3.9  3.9   Chloride 98 - 111 mmol/L 108  110  107   CO2 22 - 32 mmol/L 28  26  26    Calcium 8.9 - 10.3 mg/dL 9.8  9.5  9.8   Total Protein 6.5 - 8.1 g/dL 7.9  7.7  8.5   Total Bilirubin 0.3 - 1.2 mg/dL 0.5  0.4  0.4   Alkaline Phos 38 - 126 U/L 52  50  57   AST 15 - 41 U/L 18  17  18    ALT 0 -  44 U/L 9  8  8        RADIOGRAPHIC STUDIES: I have personally reviewed the radiological images as listed and agreed with the findings in the report. No results found.    No orders of the defined types were placed in this encounter.  All questions were answered. The patient knows to call the clinic with any problems, questions or concerns. No barriers to learning was detected. The total time spent in the appointment was 20 minutes.     Malachy Mood, MD 10/31/2022

## 2022-11-01 ENCOUNTER — Ambulatory Visit
Admission: RE | Admit: 2022-11-01 | Discharge: 2022-11-01 | Disposition: A | Discharge status: Home / Self Care | Payer: Medicaid Other | Source: Ambulatory Visit | Attending: Radiation Oncology

## 2022-11-01 ENCOUNTER — Other Ambulatory Visit: Payer: Self-pay

## 2022-11-01 DIAGNOSIS — Z510 Encounter for antineoplastic radiation therapy: Secondary | ICD-10-CM | POA: Diagnosis not present

## 2022-11-01 LAB — RAD ONC ARIA SESSION SUMMARY
Course Elapsed Days: 23
Plan Fractions Treated to Date: 18
Plan Prescribed Dose Per Fraction: 1.8 Gy
Plan Total Fractions Prescribed: 25
Plan Total Prescribed Dose: 45 Gy
Reference Point Dosage Given to Date: 32.4 Gy
Reference Point Session Dosage Given: 1.8 Gy
Session Number: 18

## 2022-11-02 ENCOUNTER — Ambulatory Visit
Admission: RE | Admit: 2022-11-02 | Discharge: 2022-11-02 | Disposition: A | Discharge status: Home / Self Care | Payer: Medicaid Other | Source: Ambulatory Visit | Attending: Radiation Oncology | Admitting: Radiation Oncology

## 2022-11-02 ENCOUNTER — Other Ambulatory Visit: Payer: Self-pay

## 2022-11-02 DIAGNOSIS — Z510 Encounter for antineoplastic radiation therapy: Secondary | ICD-10-CM | POA: Diagnosis not present

## 2022-11-02 LAB — RAD ONC ARIA SESSION SUMMARY
Course Elapsed Days: 24
Plan Fractions Treated to Date: 19
Plan Prescribed Dose Per Fraction: 1.8 Gy
Plan Total Fractions Prescribed: 25
Plan Total Prescribed Dose: 45 Gy
Reference Point Dosage Given to Date: 34.2 Gy
Reference Point Session Dosage Given: 1.8 Gy
Session Number: 19

## 2022-11-03 ENCOUNTER — Ambulatory Visit
Admission: RE | Admit: 2022-11-03 | Discharge: 2022-11-03 | Disposition: A | Discharge status: Home / Self Care | Payer: Medicaid Other | Source: Ambulatory Visit | Attending: Radiation Oncology | Admitting: Radiation Oncology

## 2022-11-03 ENCOUNTER — Other Ambulatory Visit: Payer: Self-pay

## 2022-11-03 DIAGNOSIS — Z510 Encounter for antineoplastic radiation therapy: Secondary | ICD-10-CM | POA: Diagnosis not present

## 2022-11-03 LAB — RAD ONC ARIA SESSION SUMMARY
Course Elapsed Days: 25
Plan Fractions Treated to Date: 20
Plan Prescribed Dose Per Fraction: 1.8 Gy
Plan Total Fractions Prescribed: 25
Plan Total Prescribed Dose: 45 Gy
Reference Point Dosage Given to Date: 36 Gy
Reference Point Session Dosage Given: 1.8 Gy
Session Number: 20

## 2022-11-04 DIAGNOSIS — Z510 Encounter for antineoplastic radiation therapy: Secondary | ICD-10-CM | POA: Diagnosis not present

## 2022-11-06 ENCOUNTER — Other Ambulatory Visit: Payer: Self-pay

## 2022-11-06 ENCOUNTER — Ambulatory Visit
Admission: RE | Admit: 2022-11-06 | Discharge: 2022-11-06 | Disposition: A | Discharge status: Home / Self Care | Payer: Medicaid Other | Source: Ambulatory Visit | Attending: Radiation Oncology | Admitting: Radiation Oncology

## 2022-11-06 DIAGNOSIS — Z510 Encounter for antineoplastic radiation therapy: Secondary | ICD-10-CM | POA: Diagnosis not present

## 2022-11-06 LAB — RAD ONC ARIA SESSION SUMMARY
Course Elapsed Days: 28
Plan Fractions Treated to Date: 21
Plan Prescribed Dose Per Fraction: 1.8 Gy
Plan Total Fractions Prescribed: 25
Plan Total Prescribed Dose: 45 Gy
Reference Point Dosage Given to Date: 37.8 Gy
Reference Point Session Dosage Given: 1.8 Gy
Session Number: 21

## 2022-11-07 ENCOUNTER — Ambulatory Visit
Admission: RE | Admit: 2022-11-07 | Discharge: 2022-11-07 | Disposition: A | Discharge status: Home / Self Care | Payer: Medicaid Other | Source: Ambulatory Visit | Attending: Radiation Oncology | Admitting: Radiation Oncology

## 2022-11-07 ENCOUNTER — Other Ambulatory Visit: Payer: Self-pay

## 2022-11-07 ENCOUNTER — Inpatient Hospital Stay: Payer: Medicaid Other | Admitting: Hematology

## 2022-11-07 ENCOUNTER — Inpatient Hospital Stay: Payer: Medicaid Other

## 2022-11-07 DIAGNOSIS — Z510 Encounter for antineoplastic radiation therapy: Secondary | ICD-10-CM | POA: Diagnosis not present

## 2022-11-07 LAB — RAD ONC ARIA SESSION SUMMARY
Course Elapsed Days: 29
Plan Fractions Treated to Date: 22
Plan Prescribed Dose Per Fraction: 1.8 Gy
Plan Total Fractions Prescribed: 25
Plan Total Prescribed Dose: 45 Gy
Reference Point Dosage Given to Date: 39.6 Gy
Reference Point Session Dosage Given: 1.8 Gy
Session Number: 22

## 2022-11-08 ENCOUNTER — Ambulatory Visit
Admission: RE | Admit: 2022-11-08 | Discharge: 2022-11-08 | Disposition: A | Discharge status: Home / Self Care | Payer: Medicaid Other | Source: Ambulatory Visit | Attending: Radiation Oncology

## 2022-11-08 ENCOUNTER — Other Ambulatory Visit: Payer: Self-pay

## 2022-11-08 DIAGNOSIS — Z510 Encounter for antineoplastic radiation therapy: Secondary | ICD-10-CM | POA: Diagnosis not present

## 2022-11-08 LAB — RAD ONC ARIA SESSION SUMMARY
Course Elapsed Days: 30
Plan Fractions Treated to Date: 23
Plan Prescribed Dose Per Fraction: 1.8 Gy
Plan Total Fractions Prescribed: 25
Plan Total Prescribed Dose: 45 Gy
Reference Point Dosage Given to Date: 41.4 Gy
Reference Point Session Dosage Given: 1.8 Gy
Session Number: 23

## 2022-11-09 ENCOUNTER — Other Ambulatory Visit: Payer: Self-pay

## 2022-11-09 ENCOUNTER — Ambulatory Visit
Admission: RE | Admit: 2022-11-09 | Discharge: 2022-11-09 | Disposition: A | Discharge status: Home / Self Care | Payer: Medicaid Other | Source: Ambulatory Visit | Attending: Radiation Oncology | Admitting: Radiation Oncology

## 2022-11-09 DIAGNOSIS — Z510 Encounter for antineoplastic radiation therapy: Secondary | ICD-10-CM | POA: Diagnosis not present

## 2022-11-09 LAB — RAD ONC ARIA SESSION SUMMARY
Course Elapsed Days: 31
Plan Fractions Treated to Date: 24
Plan Prescribed Dose Per Fraction: 1.8 Gy
Plan Total Fractions Prescribed: 25
Plan Total Prescribed Dose: 45 Gy
Reference Point Dosage Given to Date: 43.2 Gy
Reference Point Session Dosage Given: 1.8 Gy
Session Number: 24

## 2022-11-10 ENCOUNTER — Ambulatory Visit
Admission: RE | Admit: 2022-11-10 | Discharge: 2022-11-10 | Disposition: A | Discharge status: Home / Self Care | Payer: Medicaid Other | Source: Ambulatory Visit | Attending: Radiation Oncology

## 2022-11-10 ENCOUNTER — Other Ambulatory Visit: Payer: Self-pay

## 2022-11-10 DIAGNOSIS — Z510 Encounter for antineoplastic radiation therapy: Secondary | ICD-10-CM | POA: Diagnosis not present

## 2022-11-10 LAB — RAD ONC ARIA SESSION SUMMARY
Course Elapsed Days: 32
Plan Fractions Treated to Date: 25
Plan Prescribed Dose Per Fraction: 1.8 Gy
Plan Total Fractions Prescribed: 25
Plan Total Prescribed Dose: 45 Gy
Reference Point Dosage Given to Date: 45 Gy
Reference Point Session Dosage Given: 1.8 Gy
Session Number: 25

## 2022-11-13 ENCOUNTER — Other Ambulatory Visit: Payer: Self-pay

## 2022-11-13 ENCOUNTER — Ambulatory Visit
Admission: RE | Admit: 2022-11-13 | Discharge: 2022-11-13 | Disposition: A | Discharge status: Home / Self Care | Payer: Medicaid Other | Source: Ambulatory Visit | Attending: Radiation Oncology

## 2022-11-13 DIAGNOSIS — Z510 Encounter for antineoplastic radiation therapy: Secondary | ICD-10-CM | POA: Diagnosis not present

## 2022-11-13 LAB — RAD ONC ARIA SESSION SUMMARY
Course Elapsed Days: 35
Plan Fractions Treated to Date: 1
Plan Prescribed Dose Per Fraction: 1.8 Gy
Plan Total Fractions Prescribed: 3
Plan Total Prescribed Dose: 5.4 Gy
Reference Point Dosage Given to Date: 1.8 Gy
Reference Point Session Dosage Given: 1.8 Gy
Session Number: 26

## 2022-11-13 NOTE — Assessment & Plan Note (Signed)
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 [name]. 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 Margins and lymph nodes negative. -she received adjuvant CAPOX 03/14/21 - 05/23/21 to reduce her risk of recurrence. she had additional Xeloda alone to complete 6 months of treatment.  -surveillance colonoscopy on 06/21/21 with Dr. Leonides Schanz was unremarkable. Plan for repeat in 1 year. -restaging CT AP on 08/11/21 showed: a soft tissue nodule favored to be related to left ovary, but adenopathy not completely excluded. -Due to rising CEA, we repeated CT on 08/17/2022 which showed enlarging rounded lesion along the LEFT iliac vessels, now measuring 4.2cm, concerning for cancer recurrence. -PET from 09/05/22 showed Intensely hypermetabolic mass in the LEFT pelvis is consistent with metastatic lymphadenopathy or local recurrence of colorectal carcinoma. -pt was referred to Dr. Glennis Brink at [facility] and she recommended neoadjuvant RT, I recommend with concurrent Xeloda. She started on 10/10/2022. She is tolerating well.

## 2022-11-14 ENCOUNTER — Inpatient Hospital Stay (HOSPITAL_BASED_OUTPATIENT_CLINIC_OR_DEPARTMENT_OTHER): Payer: Medicaid Other | Admitting: Hematology

## 2022-11-14 ENCOUNTER — Other Ambulatory Visit: Payer: Self-pay

## 2022-11-14 ENCOUNTER — Inpatient Hospital Stay: Payer: Medicaid Other

## 2022-11-14 ENCOUNTER — Ambulatory Visit
Admission: RE | Admit: 2022-11-14 | Discharge: 2022-11-14 | Disposition: A | Discharge status: Home / Self Care | Payer: Medicaid Other | Source: Ambulatory Visit | Attending: Radiation Oncology

## 2022-11-14 VITALS — BP 98/71 | HR 76 | Temp 98.5°F | Resp 15 | Ht 66.0 in | Wt 151.1 lb

## 2022-11-14 DIAGNOSIS — C187 Malignant neoplasm of sigmoid colon: Secondary | ICD-10-CM

## 2022-11-14 DIAGNOSIS — Z510 Encounter for antineoplastic radiation therapy: Secondary | ICD-10-CM | POA: Diagnosis not present

## 2022-11-14 LAB — CMP (CANCER CENTER ONLY)
ALT: 7 U/L (ref 0–44)
AST: 16 U/L (ref 15–41)
Albumin: 4.3 g/dL (ref 3.5–5.0)
Alkaline Phosphatase: 59 U/L (ref 38–126)
Anion gap: 6 (ref 5–15)
BUN: 14 mg/dL (ref 6–20)
CO2: 28 mmol/L (ref 22–32)
Calcium: 9.8 mg/dL (ref 8.9–10.3)
Chloride: 108 mmol/L (ref 98–111)
Creatinine: 0.78 mg/dL (ref 0.44–1.00)
GFR, Estimated: >60 mL/min (ref >60)
Glucose, Bld: 86 mg/dL (ref 70–99)
Potassium: 3.8 mmol/L (ref 3.5–5.1)
Sodium: 142 mmol/L (ref 135–145)
Total Bilirubin: 0.6 mg/dL (ref 0.3–1.2)
Total Protein: 7.9 g/dL (ref 6.5–8.1)

## 2022-11-14 LAB — RAD ONC ARIA SESSION SUMMARY
Course Elapsed Days: 36
Plan Fractions Treated to Date: 2
Plan Prescribed Dose Per Fraction: 1.8 Gy
Plan Total Fractions Prescribed: 3
Plan Total Prescribed Dose: 5.4 Gy
Reference Point Dosage Given to Date: 3.6 Gy
Reference Point Session Dosage Given: 1.8 Gy
Session Number: 27

## 2022-11-14 LAB — CBC WITH DIFFERENTIAL (CANCER CENTER ONLY)
Abs Immature Granulocytes: 0.01 10*3/uL (ref 0.00–0.07)
Basophils Absolute: 0 10*3/uL (ref 0.0–0.1)
Basophils Relative: 0 %
Eosinophils Absolute: 0.2 10*3/uL (ref 0.0–0.5)
Eosinophils Relative: 9 %
HCT: 35.5 % — ABNORMAL LOW (ref 36.0–46.0)
Hemoglobin: 11.2 g/dL — ABNORMAL LOW (ref 12.0–15.0)
Immature Granulocytes: 1 %
Lymphocytes Relative: 12 %
Lymphs Abs: 0.3 10*3/uL — ABNORMAL LOW (ref 0.7–4.0)
MCH: 26.1 pg (ref 26.0–34.0)
MCHC: 31.5 g/dL (ref 30.0–36.0)
MCV: 82.8 fL (ref 80.0–100.0)
Monocytes Absolute: 0.3 10*3/uL (ref 0.1–1.0)
Monocytes Relative: 13 %
Neutro Abs: 1.4 10*3/uL — ABNORMAL LOW (ref 1.7–7.7)
Neutrophils Relative %: 65 %
Platelet Count: 235 10*3/uL (ref 150–400)
RBC: 4.29 MIL/uL (ref 3.87–5.11)
RDW: 17.1 % — ABNORMAL HIGH (ref 11.5–15.5)
WBC Count: 2.1 10*3/uL — ABNORMAL LOW (ref 4.0–10.5)
nRBC: 0 % (ref 0.0–0.2)

## 2022-11-14 LAB — CEA (ACCESS): CEA (CHCC): 2.15 ng/mL (ref 0.00–5.00)

## 2022-11-14 NOTE — Progress Notes (Signed)
[facility]   Telephone:([phone]) [phone] [phone]   Clinic Follow up Note   Patient Care Team: Caudle, Shelton Silvas, FNP as PCP - General (Family Medicine) Luretha Murphy, MD as Consulting Physician (General Surgery) Kathi Der, MD as Consulting Physician (Gastroenterology) Malachy Mood, MD as Consulting Physician (Hematology) Allie Bossier, MD as Consulting Physician (Obstetrics and Gynecology)  Date of Service:  11/14/2022  CHIEF COMPLAINT: f/u of recurrent colon cancer  CURRENT THERAPY:  Concurrent chemoradiation with Xeloda  Oncology History   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 [name]. 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 Margins and lymph nodes negative. -she received adjuvant CAPOX 03/14/21 - 05/23/21 to reduce her risk of recurrence. she had additional Xeloda alone to complete 6 months of treatment.  -surveillance colonoscopy on 06/21/21 with Dr. Leonides Schanz was unremarkable. Plan for repeat in 1 year. -restaging CT AP on 08/11/21 showed: a soft tissue nodule favored to be related to left ovary, but adenopathy not completely excluded. -Due to rising CEA, we repeated CT on 08/17/2022 which showed enlarging rounded lesion along the LEFT iliac vessels, now measuring 4.2cm, concerning for cancer recurrence. -PET from 09/05/22 showed Intensely hypermetabolic mass in the LEFT pelvis is consistent with metastatic lymphadenopathy or local recurrence of colorectal carcinoma. -pt was referred to Dr. Glennis Brink at [facility] and she recommended neoadjuvant RT, I recommend with concurrent Xeloda. She started on 10/10/2022. She is tolerating well.   Assessment and Plan    Metastatic Colon Cancer Undergoing chemotherapy and radiation with good response. Tumor marker (CEA) has normalized. Patient  reports skin changes and mild discomfort in the area of radiation. No other side effects reported. Surgery scheduled for December 6th. -Continue current chemotherapy and radiation regimen. -Last dose of Xeloda to be taken on the day of the last radiation treatment tomorrow -Plan for follow-up visit 1 week prior to surgery. -Continue close monitoring with repeat scans every 3-4 months post-surgery.  Skin Changes Noted skin discoloration on hands and a rash in the area of radiation. No pain reported. -Continue to monitor skin changes.  Nutrition Patient reports a healthy diet and is maintaining weight. Expressed interest in taking a multivitamin. -Encouraged to continue healthy diet. -ok to use of a multivitamin.  Plan -Current continue concurrent chemoradiation with Xeloda, she will completed tomorrow. -She has a follow-up with her surgeon at the [facility] on December 18, 2022, and her surgery is scheduled for December 29, 2022 -I will see her back 1 week before her surgery     SUMMARY OF ONCOLOGIC HISTORY: Oncology History Overview Note   Cancer Staging  Cancer of sigmoid colon [facility]) Staging form: Colon and Rectum, AJCC 8th Edition - Pathologic stage from 02/10/2021: Stage IIB (pT4a, pN0, cM0) - Signed by Malachy Mood, MD on 02/28/2021    Cancer of sigmoid colon (HCC)  11/27/2020 Imaging   CLINICAL DATA:  Abdominal pain that is began Monday. Patient reports seen scant amounts of dark blood in stool. Feels bloated.   EXAM: CT ABDOMEN AND PELVIS WITH CONTRAST  IMPRESSION: 1. Proctocolitis involving the mid and distal sigmoid colon and upper rectum with a 3.9 cm gas containing interloop abscess centered in the sigmoid mesocolon. 2. Severe fecal burden.  No findings of bowel obstruction. 3. An enlarged left common iliac lymph node is likely reactive. 4. A 2.7 cm hemangioma is noted  in the right hepatic lobe. 5. Mild focal bronchiolitis in the right lower lobe.   02/07/2021  Imaging   CLINICAL DATA:  Abdominal pain, cramping, constipation   EXAM: CT ABDOMEN AND PELVIS WITH CONTRAST  IMPRESSION: 1. There is severe, circumferential wall thickening and mucosal hyperenhancement involving the distal sigmoid colon and rectum to the anus, generally tethered appearance unchanged, involving at least 15-20 cm of distal sigmoid and rectum. 2. There is again seen an abscess at the medial aspect of the rectosigmoid junction measuring approximately 3.0 x 1.6 cm. 3. Findings are generally consistent with nonspecific infectious, inflammatory, or ischemic colitis, including inflammatory bowel disease such as Crohn's disease or ulcerative colitis; however an underlying malignancy is very difficult to exclude, particularly given substantial wall thickening. 4. Large burden of stool throughout the extremely redundant proximal colon, likely reflecting some degree of obstipation. 5. Enlarged left external iliac lymph nodes measuring up to 1.1 x 0.9 cm fall, nonspecific, possibly reactive, however nodal metastatic disease is not excluded per above.   02/09/2021 Initial Biopsy   FINAL MICROSCOPIC DIAGNOSIS:   A. COLON MASS, SIGMOID, BIOPSY:  - Adenomatous lesion with extensive high-grade dysplasia, see comment   B. RECTAL, COLON, SIGMOID, BIOPSY:  - Mild acute/ subacute nonspecific colitis, see comment  - Negative for definite features of chronicity, granulomas or dysplasia   COMMENT:  A.  Evidence of invasive carcinoma is not seen in the submitted biopsies but the biopsy fragments appear superficial and a more severe deeper process cannot be ruled out.  B.  Histologic features in the rectosigmoid biopsies are not compatible with untreated inflammatory bowel disease.  Differential diagnosis can include infection, drug-effect and stercoral proctitis among other possibilities.    02/09/2021 Procedure   Colonoscopy, Dr. Levora Angel  Impression: - Preparation of the colon was  fair. - Likely malignant partially obstructing tumor in the distal sigmoid colon. Biopsied. Tattooed. - Congested, inflamed and thickened folds of the mucosa in the recto-sigmoid colon. Biopsied. Tattooed.   02/10/2021 Cancer Staging   Staging form: Colon and Rectum, AJCC 8th Edition - Pathologic stage from 02/10/2021: Stage IIB (pT4a, pN0, cM0) - Signed by Malachy Mood, MD on 02/28/2021 Stage prefix: Initial diagnosis Total positive nodes: 0 Histologic grading system: 4 grade system Histologic grade (G): G2 Residual tumor (R): R0 - None   02/10/2021 Definitive Surgery   FINAL MICROSCOPIC DIAGNOSIS:   A. COLON, SIGMOID:  - Invasive moderately differentiated adenocarcinoma.  - Twenty-seven lymph nodes, negative for metastatic carcinoma (0/27).  - See oncology table below.   B. COLON, DISTAL, SIGMOID:  - Segment of colon with serosal adhesions, acute inflammation, and tattoo pigment.  - Negative for malignancy.    02/11/2021 Initial Diagnosis   Cancer of sigmoid colon (HCC)   03/14/2021 - 05/23/2021 Chemotherapy   Patient is on Treatment Plan : COLORECTAL Xelox (Capeox) q21d     04/20/2021 Genetic Testing   Negative hereditary cancer genetic testing: no pathogenic variants detected in [employer] CustomNext-Cancer +RNAinsight Panel.  Report date is April 20, 2021.   The CustomNext-Cancer+RNAinsight panel offered by [employer] includes sequencing and rearrangement analysis for the following 47 genes:  APC, ATM, AXIN2, BARD1, BMPR1A, BRCA1, BRCA2, BRIP1, CDH1, CDK4, CDKN2A, CHEK2, DICER1, EPCAM, GREM1, HOXB13, MEN1, MLH1, MSH2, MSH3, MSH6, MUTYH, NBN, NF1, NF2, NTHL1, PALB2, PMS2, POLD1, POLE, PTEN, RAD51C, RAD51D, RECQL, RET, SDHA, SDHAF2, SDHB, SDHC, SDHD, SMAD4, SMARCA4, STK11, TP53, TSC1, TSC2, and VHL.  RNA data is routinely analyzed for use in variant interpretation for all  genes.    02/20/2022 Imaging    IMPRESSION: 1. Similar postsurgical changes of partial sigmoidectomy with Hartmann's  pouch formation and left anterior abdominal wall colostomy. No noncontrast CT evidence of local recurrence. New nonobstructive bowel/fluid containing peristomal hernia. 2. No convincing evidence of metastatic disease within the chest, abdomen, or pelvis on this noncontrast enhanced examination. 3. New multifocal subpleural foci of nodularity correspond with regions of atelectasis seen on prior imaging, favored to reflect subsegmental atelectasis. Stable 3 mm right lower lobe pulmonary nodule, favored benign. Consider attention on dedicated short-term interval follow-up chest CT. 4. Stable soft tissue along the left iliac vessels again favored to reflect left ovary given its continuity with the gonadal vessels but difficult to evaluate given lack of intravenous contrast material. Correlation with CEA levels and continued attention on follow-up imaging suggested. 5. Moderate volume of formed stool throughout the colon. Correlate for constipation.      Discussed the use of AI scribe software for clinical note transcription with the patient, who gave verbal consent to proceed.  History of Present Illness   A 47 year old patient with metastatic colon cancer presents for a follow-up visit. The patient reports aching and skin breakdown in the area where she received radiation therapy. She also notes a rash and discoloration on her hands, which she attributes to her medication, Zolofta. The patient denies any other problems from her chemotherapy or radiation therapy. She is scheduled for surgery in December and is currently maintaining a healthy diet to prepare for the procedure. The patient also expresses interest in taking multivitamins.         All other systems were reviewed with the patient and are negative.  MEDICAL HISTORY:  Past Medical History:  Diagnosis Date   Anemia    Anxiety    Colon cancer (HCC) 01/2021   stage 2   Complication of anesthesia    took a long time to wake  up after surgery   Family history of breast cancer 03/30/2021   Family history of colon cancer 03/30/2021    SURGICAL HISTORY: Past Surgical History:  Procedure Laterality Date   ABDOMINAL HYSTERECTOMY Bilateral 09/26/2016   Procedure: HYSTERECTOMY ABDOMINAL W/ BILATERAL SALPINGECTOMY;  Surgeon: Allie Bossier, MD;  Location: [facility];  Service: Gynecology;  Laterality: Bilateral;   BIOPSY  02/09/2021   Procedure: BIOPSY;  Surgeon: Kathi Der, MD;  Location: [facility];  Service: Gastroenterology;;   BREAST BIOPSY Left    COLONOSCOPY N/A 02/09/2021   Procedure: COLONOSCOPY;  Surgeon: Kathi Der, MD;  Location: [facility];  Service: Gastroenterology;  Laterality: N/A;   LAPAROSCOPIC PARTIAL COLECTOMY N/A 02/10/2021   Procedure: ROBOTIC ASSISTED PARTIAL COLECTOMY WITH COLOSTOMY;  Surgeon: Luretha Murphy, MD;  Location: [facility];  Service: General;  Laterality: N/A;   SUBMUCOSAL TATTOO INJECTION  02/09/2021   Procedure: SUBMUCOSAL TATTOO INJECTION;  Surgeon: Kathi Der, MD;  Location: [facility];  Service: Gastroenterology;;   TUBAL LIGATION      I have reviewed the social history and family history with the patient and they are unchanged from previous note.  ALLERGIES:  is allergic to ferrlecit [na ferric gluc cplx in sucrose].  MEDICATIONS:  Current Outpatient Medications  Medication Sig Dispense Refill   acetaminophen (TYLENOL) 500 MG tablet Take 2 tablets (1,000 mg total) by mouth every 6 (six) hours as needed for mild pain. 30 tablet 0   capecitabine (XELODA) 500 MG tablet Take 3 tablets (1,500 mg total) by mouth 2 (two) times daily after a meal.  Take the days of radiation, Monday through Friday, off on weekends 90 tablet 1   metroNIDAZOLE (FLAGYL) 500 MG tablet Take 1 tablet (500 mg total) by mouth 2 (two) times daily. (Patient not taking: Reported on 09/26/2022) 14 tablet 0   ondansetron (ZOFRAN) 8 MG tablet Take 1 tablet (8 mg total) by mouth every 8 (eight) hours  as needed for refractory nausea / vomiting. Start on day 3 after chemotherapy. 30 tablet 1   pantoprazole (PROTONIX) 40 MG tablet Take 40 mg by mouth daily as needed. (Patient not taking: Reported on 09/26/2022)     polyethylene glycol (MIRALAX / GLYCOLAX) 17 g packet Take 17 g by mouth daily. (Patient not taking: Reported on 09/26/2022) 14 each 0   No current facility-administered medications for this visit.    PHYSICAL EXAMINATION: ECOG PERFORMANCE STATUS: 1 - Symptomatic but completely ambulatory  Vitals:   11/14/22 0910  BP: 98/71  Pulse: 76  Resp: 15  Temp: 98.5 F (36.9 C)  SpO2: 100%   Wt Readings from Last 3 Encounters:  11/14/22 151 lb 1.6 oz (68.5 kg)  10/31/22 151 lb 14.4 oz (68.9 kg)  10/24/22 152 lb (68.9 kg)     GENERAL:alert, no distress and comfortable SKIN: skin color, texture, turgor are normal, no rashes or significant lesions except skin hyperpigmentation in her hands EYES: normal, Conjunctiva are pink and non-injected, sclera clear Musculoskeletal:no cyanosis of digits and no clubbing  NEURO: alert & oriented x 3 with fluent speech, no focal motor/sensory deficits    LABORATORY DATA:  I have reviewed the data as listed    Latest Ref Rng & Units 11/14/2022    8:50 AM 10/31/2022    9:14 AM 10/24/2022    8:34 AM  CBC  WBC 4.0 - 10.5 K/uL 2.1  2.3  2.4   Hemoglobin 12.0 - 15.0 g/dL 16.1  09.6  04.5   Hematocrit 36.0 - 46.0 % 35.5  36.5  35.8   Platelets 150 - 400 K/uL 235  171  189         Latest Ref Rng & Units 11/14/2022    8:50 AM 10/31/2022    9:14 AM 10/24/2022    8:34 AM  CMP  Glucose 70 - 99 mg/dL 86  89  87   BUN 6 - 20 mg/dL 14  12  14    Creatinine 0.44 - 1.00 mg/dL 4.09  8.11  9.14   Sodium 135 - 145 mmol/L 142  141  142   Potassium 3.5 - 5.1 mmol/L 3.8  4.1  3.9   Chloride 98 - 111 mmol/L 108  108  110   CO2 22 - 32 mmol/L 28  28  26    Calcium 8.9 - 10.3 mg/dL 9.8  9.8  9.5   Total Protein 6.5 - 8.1 g/dL 7.9  7.9  7.7   Total  Bilirubin 0.3 - 1.2 mg/dL 0.6  0.5  0.4   Alkaline Phos 38 - 126 U/L 59  52  50   AST 15 - 41 U/L 16  18  17    ALT 0 - 44 U/L 7  9  8        RADIOGRAPHIC STUDIES: I have personally reviewed the radiological images as listed and agreed with the findings in the report. No results found.    No orders of the defined types were placed in this encounter.  All questions were answered. The patient knows to call the clinic with any problems, questions or concerns.  No barriers to learning was detected. The total time spent in the appointment was 20 minutes.     Malachy Mood, MD 11/14/2022

## 2022-11-15 ENCOUNTER — Other Ambulatory Visit: Payer: Self-pay

## 2022-11-15 ENCOUNTER — Ambulatory Visit
Admission: RE | Admit: 2022-11-15 | Discharge: 2022-11-15 | Disposition: A | Discharge status: Home / Self Care | Payer: Medicaid Other | Source: Ambulatory Visit | Attending: Radiation Oncology

## 2022-11-15 ENCOUNTER — Telehealth: Payer: Self-pay | Admitting: Hematology

## 2022-11-15 DIAGNOSIS — Z510 Encounter for antineoplastic radiation therapy: Secondary | ICD-10-CM | POA: Diagnosis not present

## 2022-11-15 LAB — RAD ONC ARIA SESSION SUMMARY
Course Elapsed Days: 37
Plan Fractions Treated to Date: 3
Plan Prescribed Dose Per Fraction: 1.8 Gy
Plan Total Fractions Prescribed: 3
Plan Total Prescribed Dose: 5.4 Gy
Reference Point Dosage Given to Date: 5.4 Gy
Reference Point Session Dosage Given: 1.8 Gy
Session Number: 28

## 2022-11-16 NOTE — Radiation Completion Notes (Addendum)
Radiation Oncology         [phone] ________________________________  Name: Barbara Valdez MRN: [id number]  Date of Service: 11/15/2022  DOB: Jun 06, 1975  End of Treatment Note   Diagnosis:   Locally recurrent Stage IIB, p4aN0M0, adenocarcinoma of the sigmoid colon   Intent: Curative     ==========DELIVERED PLANS==========  First Treatment Date: 2022-10-09 - Last Treatment Date: 2022-11-15   Plan Name: Abd_Pelv Site: Colon Technique: 3D Mode: Photon Dose Per Fraction: 1.8 Gy Prescribed Dose (Delivered / Prescribed): 45 Gy / 45 Gy Prescribed Fxs (Delivered / Prescribed): 25 / 25   Plan Name: Abd_Pelv_Bst Site: Colon Technique: 3D Mode: Photon Dose Per Fraction: 1.8 Gy Prescribed Dose (Delivered / Prescribed): 5.4 Gy / 5.4 Gy Prescribed Fxs (Delivered / Prescribed): 3 / 3     ==========ON TREATMENT VISIT DATES========== 2022-10-13, 2022-10-20, 2022-10-27, 2022-11-03, 2022-11-10, 2022-11-15   See weekly On Treatment Notes in Epic for details. The patient tolerated radiation. She developed anticipated skin changes in the treatment field.   The patient will receive a call in about one month from the radiation oncology department. She will continue follow up with Dr. [name] as well.      Osker Mason, PAC

## 2022-11-17 ENCOUNTER — Other Ambulatory Visit: Payer: Self-pay

## 2022-11-20 ENCOUNTER — Other Ambulatory Visit: Payer: Self-pay

## 2022-11-20 NOTE — Progress Notes (Signed)
Removing patient from specialty pharmacy program. Therapy completed 11/15/22 - confirmed with [facility].

## 2022-11-30 ENCOUNTER — Encounter: Payer: Self-pay | Admitting: Internal Medicine

## 2022-12-12 ENCOUNTER — Ambulatory Visit (HOSPITAL_BASED_OUTPATIENT_CLINIC_OR_DEPARTMENT_OTHER): Payer: Medicaid Other | Admitting: Family Medicine

## 2022-12-18 ENCOUNTER — Ambulatory Visit
Admission: RE | Admit: 2022-12-18 | Discharge: 2022-12-18 | Disposition: A | Discharge status: Home / Self Care | Payer: Medicaid Other | Source: Ambulatory Visit | Attending: Radiation Oncology | Admitting: Radiation Oncology

## 2022-12-18 NOTE — Progress Notes (Signed)
  Radiation Oncology         [phone] ________________________________  Name: Barbara Valdez MRN: [id number]  Date of Service: 12/18/2022  DOB: 1975-04-06  Post Treatment Telephone Note  Diagnosis:  Locally recurrent Stage IIB, p4aN0M0, adenocarcinoma of the sigmoid colon (as documented in provider EOT note)  The patient was available for call today.   Symptoms of fatigue have improved since completing therapy.  Symptoms of skin changes have improved since completing therapy.  Symptoms of nausea or vomiting have improved since completing therapy.  The patient has scheduled follow up with her medical oncologist Dr. [name] for ongoing surveillance, and was encouraged to call if she develops concerns or questions regarding radiation.  This concludes the interaction.  Ruel Favors, LPN

## 2022-12-19 NOTE — Assessment & Plan Note (Signed)
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 [name]. 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 Margins and lymph nodes negative. -she received adjuvant CAPOX 03/14/21 - 05/23/21 to reduce her risk of recurrence. she had additional Xeloda alone to complete 6 months of treatment.  -surveillance colonoscopy on 06/21/21 with Dr. Leonides Schanz was unremarkable. Plan for repeat in 1 year. -restaging CT AP on 08/11/21 showed: a soft tissue nodule favored to be related to left ovary, but adenopathy not completely excluded. -Due to rising CEA, we repeated CT on 08/17/2022 which showed enlarging rounded lesion along the LEFT iliac vessels, now measuring 4.2cm, concerning for cancer recurrence. -PET from 09/05/22 showed Intensely hypermetabolic mass in the LEFT pelvis is consistent with metastatic lymphadenopathy or local recurrence of colorectal carcinoma. -pt was referred to Dr. Glennis Brink at [facility] and she recommended neoadjuvant RT, I recommend with concurrent Xeloda. She started on 10/10/2022 and finished on 11/15/2022. She tolerated treatment very well and the tumor marker CEA dropped down to normal again.  She is scheduled for surgery on December 29, 2022.

## 2022-12-20 ENCOUNTER — Inpatient Hospital Stay (HOSPITAL_BASED_OUTPATIENT_CLINIC_OR_DEPARTMENT_OTHER): Payer: Medicaid Other | Admitting: Hematology

## 2022-12-20 ENCOUNTER — Inpatient Hospital Stay: Payer: Medicaid Other | Attending: Hematology

## 2022-12-20 VITALS — BP 112/80 | HR 60 | Temp 97.5°F | Resp 18 | Ht 66.0 in | Wt 147.3 lb

## 2022-12-20 DIAGNOSIS — C187 Malignant neoplasm of sigmoid colon: Secondary | ICD-10-CM | POA: Insufficient documentation

## 2022-12-20 LAB — CMP (CANCER CENTER ONLY)
ALT: 6 U/L (ref 0–44)
AST: 15 U/L (ref 15–41)
Albumin: 4.1 g/dL (ref 3.5–5.0)
Alkaline Phosphatase: 61 U/L (ref 38–126)
Anion gap: 6 (ref 5–15)
BUN: 12 mg/dL (ref 6–20)
CO2: 26 mmol/L (ref 22–32)
Calcium: 9.4 mg/dL (ref 8.9–10.3)
Chloride: 109 mmol/L (ref 98–111)
Creatinine: 0.73 mg/dL (ref 0.44–1.00)
GFR, Estimated: >60 mL/min (ref >60)
Glucose, Bld: 94 mg/dL (ref 70–99)
Potassium: 3.9 mmol/L (ref 3.5–5.1)
Sodium: 141 mmol/L (ref 135–145)
Total Bilirubin: 0.5 mg/dL (ref <1.2)
Total Protein: 7.8 g/dL (ref 6.5–8.1)

## 2022-12-20 LAB — CBC WITH DIFFERENTIAL (CANCER CENTER ONLY)
Abs Immature Granulocytes: 0 10*3/uL (ref 0.00–0.07)
Basophils Absolute: 0 10*3/uL (ref 0.0–0.1)
Basophils Relative: 1 %
Eosinophils Absolute: 0.1 10*3/uL (ref 0.0–0.5)
Eosinophils Relative: 6 %
HCT: 35.3 % — ABNORMAL LOW (ref 36.0–46.0)
Hemoglobin: 11.2 g/dL — ABNORMAL LOW (ref 12.0–15.0)
Immature Granulocytes: 0 %
Lymphocytes Relative: 19 %
Lymphs Abs: 0.4 10*3/uL — ABNORMAL LOW (ref 0.7–4.0)
MCH: 26.7 pg (ref 26.0–34.0)
MCHC: 31.7 g/dL (ref 30.0–36.0)
MCV: 84 fL (ref 80.0–100.0)
Monocytes Absolute: 0.3 10*3/uL (ref 0.1–1.0)
Monocytes Relative: 13 %
Neutro Abs: 1.4 10*3/uL — ABNORMAL LOW (ref 1.7–7.7)
Neutrophils Relative %: 61 %
Platelet Count: 205 10*3/uL (ref 150–400)
RBC: 4.2 MIL/uL (ref 3.87–5.11)
RDW: 16.7 % — ABNORMAL HIGH (ref 11.5–15.5)
WBC Count: 2.2 10*3/uL — ABNORMAL LOW (ref 4.0–10.5)
nRBC: 0 % (ref 0.0–0.2)

## 2022-12-20 LAB — CEA (ACCESS): CEA (CHCC): <1 ng/mL (ref 0.00–5.00)

## 2022-12-20 NOTE — Progress Notes (Signed)
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 [name]. 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 Margins and lymph nodes negative. -she received adjuvant CAPOX 03/14/21 - 05/23/21 to reduce her risk of recurrence. she had additional Xeloda alone to complete 6 months of treatment.  -surveillance colonoscopy on 06/21/21 with Dr. Leonides Schanz was unremarkable. Plan for repeat in 1 year. -restaging CT AP on 08/11/21 showed: a soft tissue nodule favored to be related to left ovary, but adenopathy not completely excluded. -Due to rising CEA, we repeated CT on 08/17/2022 which showed enlarging rounded lesion along the LEFT iliac vessels, now measuring 4.2cm, concerning for cancer recurrence. -PET from 09/05/22 showed Intensely hypermetabolic mass in the LEFT pelvis is consistent with metastatic lymphadenopathy or local recurrence of colorectal carcinoma. -pt was referred to Dr. Glennis Brink at [facility] and she recommended neoadjuvant RT, I recommend with concurrent Xeloda. She started on 10/10/2022 and finished on 11/15/2022. She tolerated treatment very well and the tumor marker CEA dropped down to normal again.  She is scheduled for surgery on December 29, 2022.    Assessment and Plan    Recurrent Colon  Cancer 47 year old with recurrent colon cancer, previously treated with chemotherapy and radiation. Recent MRI showed tumor size reduced from 4.5 x 6.3 cm to 2.2 x 3.2 cm, located in the left pelvic side wall near the left external iliac vessel. Surgery scheduled for December 29, 2022. High risk for recurrence and potential nerve damage during surgery. White count low due to previous chemotherapy but adequate for surgery. Hemoglobin 11.2, platelets normal. Tumor marker normalized. Discussed potential temporary colostomy for six weeks post-surgery, final decision intraoperative. Post-surgery, close monitoring with scans every 3-6 months due to high recurrence risk.  Do not plan to offer adjuvant chemotherapy - Proceed with surgery on December 29, 2022 - Monitor post-surgery with scans every 3-6 months - Schedule follow-up colonoscopy 6 months post-surgery - Provide IV fluids and pain medication post-surgery if needed - Schedule follow-up appointment in mid-January 2025  Plan -Patient will proceed with surgery at the [facility] on December 6 -Lab and follow-up in mid January 2025    SUMMARY OF ONCOLOGIC HISTORY: Oncology History Overview Note   Cancer Staging  Cancer of sigmoid colon [facility]) Staging form: Colon and Rectum, AJCC 8th Edition - Pathologic stage from 02/10/2021: Stage IIB (pT4a, pN0, cM0) - Signed by Malachy Mood, MD on 02/28/2021    Cancer of sigmoid colon (HCC)  11/27/2020 Imaging   CLINICAL DATA:  Abdominal pain that is began Monday. Patient reports seen scant amounts of dark blood in stool. Feels bloated.   EXAM: CT ABDOMEN AND PELVIS WITH CONTRAST  IMPRESSION: 1. Proctocolitis involving the mid and distal sigmoid colon and upper rectum with a 3.9 cm gas containing interloop abscess centered in the sigmoid mesocolon. 2. Severe fecal burden.  No findings of bowel obstruction. 3. An  enlarged left common iliac lymph node is likely reactive. 4. A 2.7 cm hemangioma is noted in  the right hepatic lobe. 5. Mild focal bronchiolitis in the right lower lobe.   02/07/2021 Imaging   CLINICAL DATA:  Abdominal pain, cramping, constipation   EXAM: CT ABDOMEN AND PELVIS WITH CONTRAST  IMPRESSION: 1. There is severe, circumferential wall thickening and mucosal hyperenhancement involving the distal sigmoid colon and rectum to the anus, generally tethered appearance unchanged, involving at least 15-20 cm of distal sigmoid and rectum. 2. There is again seen an abscess at the medial aspect of the rectosigmoid junction measuring approximately 3.0 x 1.6 cm. 3. Findings are generally consistent with nonspecific infectious, inflammatory, or ischemic colitis, including inflammatory bowel disease such as Crohn's disease or ulcerative colitis; however an underlying malignancy is very difficult to exclude, particularly given substantial wall thickening. 4. Large burden of stool throughout the extremely redundant proximal colon, likely reflecting some degree of obstipation. 5. Enlarged left external iliac lymph nodes measuring up to 1.1 x 0.9 cm fall, nonspecific, possibly reactive, however nodal metastatic disease is not excluded per above.   02/09/2021 Initial Biopsy   FINAL MICROSCOPIC DIAGNOSIS:   A. COLON MASS, SIGMOID, BIOPSY:  - Adenomatous lesion with extensive high-grade dysplasia, see comment   B. RECTAL, COLON, SIGMOID, BIOPSY:  - Mild acute/ subacute nonspecific colitis, see comment  - Negative for definite features of chronicity, granulomas or dysplasia   COMMENT:  A.  Evidence of invasive carcinoma is not seen in the submitted biopsies but the biopsy fragments appear superficial and a more severe deeper process cannot be ruled out.  B.  Histologic features in the rectosigmoid biopsies are not compatible with untreated inflammatory bowel disease.  Differential diagnosis can include infection, drug-effect and stercoral proctitis among other possibilities.     02/09/2021 Procedure   Colonoscopy, Dr. Levora Angel  Impression: - Preparation of the colon was fair. - Likely malignant partially obstructing tumor in the distal sigmoid colon. Biopsied. Tattooed. - Congested, inflamed and thickened folds of the mucosa in the recto-sigmoid colon. Biopsied. Tattooed.   02/10/2021 Cancer Staging   Staging form: Colon and Rectum, AJCC 8th Edition - Pathologic stage from 02/10/2021: Stage IIB (pT4a, pN0, cM0) - Signed by Malachy Mood, MD on 02/28/2021 Stage prefix: Initial diagnosis Total positive nodes: 0 Histologic grading system: 4 grade system Histologic grade (G): G2 Residual tumor (R): R0 - None   02/10/2021 Definitive Surgery   FINAL MICROSCOPIC DIAGNOSIS:   A. COLON, SIGMOID:  - Invasive moderately differentiated adenocarcinoma.  - Twenty-seven lymph nodes, negative for metastatic carcinoma (0/27).  - See oncology table below.   B. COLON, DISTAL, SIGMOID:  - Segment of colon with serosal adhesions, acute inflammation, and tattoo pigment.  - Negative for malignancy.    02/11/2021 Initial Diagnosis   Cancer of sigmoid colon (HCC)   03/14/2021 - 05/23/2021 Chemotherapy   Patient is on Treatment Plan : COLORECTAL Xelox (Capeox) q21d     04/20/2021 Genetic Testing   Negative hereditary cancer genetic testing: no pathogenic variants detected in [employer] CustomNext-Cancer +RNAinsight Panel.  Report date is April 20, 2021.   The CustomNext-Cancer+RNAinsight panel offered by [employer] includes sequencing and rearrangement analysis for the following 47 genes:  APC, ATM, AXIN2, BARD1, BMPR1A, BRCA1, BRCA2, BRIP1, CDH1, CDK4, CDKN2A, CHEK2, DICER1, EPCAM, GREM1, HOXB13, MEN1, MLH1, MSH2, MSH3, MSH6, MUTYH, NBN, NF1, NF2, NTHL1, PALB2, PMS2, POLD1, POLE, PTEN, RAD51C, RAD51D, RECQL, RET, SDHA, SDHAF2, SDHB, SDHC, SDHD, SMAD4, SMARCA4, STK11, TP53, TSC1,  TSC2, and VHL.  RNA data is routinely analyzed for use in variant interpretation for all genes.    02/20/2022  Imaging    IMPRESSION: 1. Similar postsurgical changes of partial sigmoidectomy with Hartmann's pouch formation and left anterior abdominal wall colostomy. No noncontrast CT evidence of local recurrence. New nonobstructive bowel/fluid containing peristomal hernia. 2. No convincing evidence of metastatic disease within the chest, abdomen, or pelvis on this noncontrast enhanced examination. 3. New multifocal subpleural foci of nodularity correspond with regions of atelectasis seen on prior imaging, favored to reflect subsegmental atelectasis. Stable 3 mm right lower lobe pulmonary nodule, favored benign. Consider attention on dedicated short-term interval follow-up chest CT. 4. Stable soft tissue along the left iliac vessels again favored to reflect left ovary given its continuity with the gonadal vessels but difficult to evaluate given lack of intravenous contrast material. Correlation with CEA levels and continued attention on follow-up imaging suggested. 5. Moderate volume of formed stool throughout the colon. Correlate for constipation.      Discussed the use of AI scribe software for clinical note transcription with the patient, who gave verbal consent to proceed.  History of Present Illness   The patient, a 47 year old female with a history of recurrent colon cancer, presents for a follow-up visit. She reports feeling well overall and has recovered from recent chemotherapy and radiation treatments. She is scheduled for surgery next week. A recent scan shows a significant decrease in the size of the previously identified mass, which has shrunk by approximately 50%. The patient is aware that the surgery may result in some nerve damage due to the location of the mass. She has been informed that she may need a temporary colostomy bag post-surgery. The patient reports no other concerns or symptoms at this time.         All other systems were reviewed with the patient and are  negative.  MEDICAL HISTORY:  Past Medical History:  Diagnosis Date   Anemia    Anxiety    Colon cancer (HCC) 01/2021   stage 2   Complication of anesthesia    took a long time to wake up after surgery   Family history of breast cancer 03/30/2021   Family history of colon cancer 03/30/2021    SURGICAL HISTORY: Past Surgical History:  Procedure Laterality Date   ABDOMINAL HYSTERECTOMY Bilateral 09/26/2016   Procedure: HYSTERECTOMY ABDOMINAL W/ BILATERAL SALPINGECTOMY;  Surgeon: Allie Bossier, MD;  Location: [facility];  Service: Gynecology;  Laterality: Bilateral;   BIOPSY  02/09/2021   Procedure: BIOPSY;  Surgeon: Kathi Der, MD;  Location: [facility];  Service: Gastroenterology;;   BREAST BIOPSY Left    COLONOSCOPY N/A 02/09/2021   Procedure: COLONOSCOPY;  Surgeon: Kathi Der, MD;  Location: [facility];  Service: Gastroenterology;  Laterality: N/A;   LAPAROSCOPIC PARTIAL COLECTOMY N/A 02/10/2021   Procedure: ROBOTIC ASSISTED PARTIAL COLECTOMY WITH COLOSTOMY;  Surgeon: Luretha Murphy, MD;  Location: [facility];  Service: General;  Laterality: N/A;   SUBMUCOSAL TATTOO INJECTION  02/09/2021   Procedure: SUBMUCOSAL TATTOO INJECTION;  Surgeon: Kathi Der, MD;  Location: [facility];  Service: Gastroenterology;;   TUBAL LIGATION      I have reviewed the social history and family history with the patient and they are unchanged from previous note.  ALLERGIES:  is allergic to ferrlecit [na ferric gluc cplx in sucrose].  MEDICATIONS:  Current Outpatient Medications  Medication Sig Dispense Refill   acetaminophen (TYLENOL) 500 MG tablet Take 2 tablets (1,000 mg total)  by mouth every 6 (six) hours as needed for mild pain. 30 tablet 0   capecitabine (XELODA) 500 MG tablet Take 3 tablets (1,500 mg total) by mouth 2 (two) times daily after a meal. Take the days of radiation, Monday through Friday, off on weekends 90 tablet 1   metroNIDAZOLE (FLAGYL) 500 MG tablet Take 1 tablet  (500 mg total) by mouth 2 (two) times daily. (Patient not taking: Reported on 09/26/2022) 14 tablet 0   ondansetron (ZOFRAN) 8 MG tablet Take 1 tablet (8 mg total) by mouth every 8 (eight) hours as needed for refractory nausea / vomiting. Start on day 3 after chemotherapy. 30 tablet 1   pantoprazole (PROTONIX) 40 MG tablet Take 40 mg by mouth daily as needed. (Patient not taking: Reported on 09/26/2022)     polyethylene glycol (MIRALAX / GLYCOLAX) 17 g packet Take 17 g by mouth daily. (Patient not taking: Reported on 09/26/2022) 14 each 0   No current facility-administered medications for this visit.    PHYSICAL EXAMINATION: ECOG PERFORMANCE STATUS: 0 - Asymptomatic  Vitals:   12/20/22 0949  BP: 112/80  Pulse: 60  Resp: 18  Temp: (!) 97.5 F (36.4 C)  SpO2: 100%   Wt Readings from Last 3 Encounters:  12/20/22 147 lb 4.8 oz (66.8 kg)  11/14/22 151 lb 1.6 oz (68.5 kg)  10/31/22 151 lb 14.4 oz (68.9 kg)     GENERAL:alert, no distress and comfortable SKIN: skin color, texture, turgor are normal, no rashes or significant lesions EYES: normal, Conjunctiva are pink and non-injected, sclera clear NECK: supple, thyroid normal size, non-tender, without nodularity LYMPH:  no palpable lymphadenopathy in the cervical, axillary  LUNGS: clear to auscultation and percussion with normal breathing effort HEART: regular rate & rhythm and no murmurs and no lower extremity edema ABDOMEN:abdomen soft, non-tender and normal bowel sounds Musculoskeletal:no cyanosis of digits and no clubbing  NEURO: alert & oriented x 3 with fluent speech, no focal motor/sensory deficits    LABORATORY DATA:  I have reviewed the data as listed    Latest Ref Rng & Units 12/20/2022    9:11 AM 11/14/2022    8:50 AM 10/31/2022    9:14 AM  CBC  WBC 4.0 - 10.5 K/uL 2.2  2.1  2.3   Hemoglobin 12.0 - 15.0 g/dL 21.3  08.6  57.8   Hematocrit 36.0 - 46.0 % 35.3  35.5  36.5   Platelets 150 - 400 K/uL 205  235  171          Latest Ref Rng & Units 12/20/2022    9:11 AM 11/14/2022    8:50 AM 10/31/2022    9:14 AM  CMP  Glucose 70 - 99 mg/dL 94  86  89   BUN 6 - 20 mg/dL 12  14  12    Creatinine 0.44 - 1.00 mg/dL 4.69  6.29  5.28   Sodium 135 - 145 mmol/L 141  142  141   Potassium 3.5 - 5.1 mmol/L 3.9  3.8  4.1   Chloride 98 - 111 mmol/L 109  108  108   CO2 22 - 32 mmol/L 26  28  28    Calcium 8.9 - 10.3 mg/dL 9.4  9.8  9.8   Total Protein 6.5 - 8.1 g/dL 7.8  7.9  7.9   Total Bilirubin <1.2 mg/dL 0.5  0.6  0.5   Alkaline Phos 38 - 126 U/L 61  59  52   AST 15 - 41 U/L 15  16  18   ALT 0 - 44 U/L 6  7  9        RADIOGRAPHIC STUDIES: I have personally reviewed the radiological images as listed and agreed with the findings in the report. No results found.    No orders of the defined types were placed in this encounter.  All questions were answered. The patient knows to call the clinic with any problems, questions or concerns. No barriers to learning was detected. The total time spent in the appointment was 20 minutes.     Malachy Mood, MD 12/20/2022

## 2022-12-25 ENCOUNTER — Ambulatory Visit (HOSPITAL_BASED_OUTPATIENT_CLINIC_OR_DEPARTMENT_OTHER): Payer: Medicaid Other | Admitting: Family Medicine

## 2022-12-27 ENCOUNTER — Ambulatory Visit (HOSPITAL_BASED_OUTPATIENT_CLINIC_OR_DEPARTMENT_OTHER): Payer: Medicaid Other | Admitting: Family Medicine

## 2023-01-03 ENCOUNTER — Encounter (HOSPITAL_BASED_OUTPATIENT_CLINIC_OR_DEPARTMENT_OTHER): Payer: Self-pay | Admitting: Family Medicine

## 2023-01-03 NOTE — Telephone Encounter (Signed)
Attempted to call pt to see if we could reschedule missed appt from 12/4. Unable to reach so have left detailed message on phone about this and also have sent mychart message. Will await response.

## 2023-01-11 LAB — MOLECULAR PATHOLOGY

## 2023-01-26 ENCOUNTER — Other Ambulatory Visit (HOSPITAL_BASED_OUTPATIENT_CLINIC_OR_DEPARTMENT_OTHER): Payer: Self-pay

## 2023-01-26 MED ORDER — ESTRADIOL 0.1 MG/24HR TD PTWK
0.1000 mg | MEDICATED_PATCH | TRANSDERMAL | 12 refills | Status: DC
  Filled 2023-01-26 – 2023-02-01 (×2): qty 4, 28d supply, fill #0

## 2023-02-01 ENCOUNTER — Other Ambulatory Visit (HOSPITAL_COMMUNITY): Payer: Self-pay

## 2023-02-01 ENCOUNTER — Other Ambulatory Visit (HOSPITAL_BASED_OUTPATIENT_CLINIC_OR_DEPARTMENT_OTHER): Payer: Self-pay

## 2023-02-13 ENCOUNTER — Other Ambulatory Visit (HOSPITAL_COMMUNITY): Payer: Self-pay

## 2023-02-22 ENCOUNTER — Inpatient Hospital Stay (HOSPITAL_BASED_OUTPATIENT_CLINIC_OR_DEPARTMENT_OTHER): Payer: Medicaid Other | Admitting: Hematology

## 2023-02-22 ENCOUNTER — Inpatient Hospital Stay: Payer: Medicaid Other | Attending: Hematology

## 2023-02-22 VITALS — BP 105/80 | HR 80 | Temp 97.7°F | Resp 17 | Wt 135.9 lb

## 2023-02-22 DIAGNOSIS — Z803 Family history of malignant neoplasm of breast: Secondary | ICD-10-CM | POA: Insufficient documentation

## 2023-02-22 DIAGNOSIS — R232 Flushing: Secondary | ICD-10-CM | POA: Insufficient documentation

## 2023-02-22 DIAGNOSIS — Z800 Family history of malignant neoplasm of digestive organs: Secondary | ICD-10-CM | POA: Diagnosis not present

## 2023-02-22 DIAGNOSIS — C187 Malignant neoplasm of sigmoid colon: Secondary | ICD-10-CM

## 2023-02-22 LAB — CBC WITH DIFFERENTIAL (CANCER CENTER ONLY)
Abs Immature Granulocytes: 0.01 10*3/uL (ref 0.00–0.07)
Basophils Absolute: 0 10*3/uL (ref 0.0–0.1)
Basophils Relative: 0 %
Eosinophils Absolute: 0.2 10*3/uL (ref 0.0–0.5)
Eosinophils Relative: 7 %
HCT: 36.9 % (ref 36.0–46.0)
Hemoglobin: 11.2 g/dL — ABNORMAL LOW (ref 12.0–15.0)
Immature Granulocytes: 0 %
Lymphocytes Relative: 28 %
Lymphs Abs: 0.7 10*3/uL (ref 0.7–4.0)
MCH: 26 pg (ref 26.0–34.0)
MCHC: 30.4 g/dL (ref 30.0–36.0)
MCV: 85.8 fL (ref 80.0–100.0)
Monocytes Absolute: 0.3 10*3/uL (ref 0.1–1.0)
Monocytes Relative: 11 %
Neutro Abs: 1.2 10*3/uL — ABNORMAL LOW (ref 1.7–7.7)
Neutrophils Relative %: 54 %
Platelet Count: 297 10*3/uL (ref 150–400)
RBC: 4.3 MIL/uL (ref 3.87–5.11)
RDW: 14.5 % (ref 11.5–15.5)
WBC Count: 2.3 10*3/uL — ABNORMAL LOW (ref 4.0–10.5)
nRBC: 0 % (ref 0.0–0.2)

## 2023-02-22 LAB — CMP (CANCER CENTER ONLY)
ALT: 5 U/L (ref 0–44)
AST: 15 U/L (ref 15–41)
Albumin: 4.4 g/dL (ref 3.5–5.0)
Alkaline Phosphatase: 53 U/L (ref 38–126)
Anion gap: 6 (ref 5–15)
BUN: 12 mg/dL (ref 6–20)
CO2: 28 mmol/L (ref 22–32)
Calcium: 9.7 mg/dL (ref 8.9–10.3)
Chloride: 107 mmol/L (ref 98–111)
Creatinine: 0.73 mg/dL (ref 0.44–1.00)
GFR, Estimated: >60 mL/min (ref >60)
Glucose, Bld: 85 mg/dL (ref 70–99)
Potassium: 4 mmol/L (ref 3.5–5.1)
Sodium: 141 mmol/L (ref 135–145)
Total Bilirubin: 0.5 mg/dL (ref 0.0–1.2)
Total Protein: 8 g/dL (ref 6.5–8.1)

## 2023-02-22 LAB — CEA (ACCESS): CEA (CHCC): <1 ng/mL (ref 0.00–5.00)

## 2023-02-22 NOTE — Assessment & Plan Note (Signed)
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 [name]. 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 Margins and lymph nodes negative. -she received adjuvant CAPOX 03/14/21 - 05/23/21 to reduce her risk of recurrence. she had additional Xeloda alone to complete 6 months of treatment.  -surveillance colonoscopy on 06/21/21 with Dr. Leonides Schanz was unremarkable. Plan for repeat in 1 year. -restaging CT AP on 08/11/21 showed: a soft tissue nodule favored to be related to left ovary, but adenopathy not completely excluded. -Due to rising CEA, we repeated CT on 08/17/2022 which showed enlarging rounded lesion along the LEFT iliac vessels, now measuring 4.2cm, concerning for cancer recurrence. -PET from 09/05/22 showed Intensely hypermetabolic mass in the LEFT pelvis is consistent with metastatic lymphadenopathy or local recurrence of colorectal carcinoma. -pt was referred to Dr. Glennis Brink at [facility] and she recommended neoadjuvant RT, I recommend with concurrent Xeloda. She started on 10/10/2022 and finished on 11/15/2022. She tolerated treatment very well and the tumor marker CEA dropped down to normal again.   -She underwent surgery on December 29, 2022. No residual disease

## 2023-02-23 ENCOUNTER — Encounter: Payer: Self-pay | Admitting: Hematology

## 2023-02-23 NOTE — Progress Notes (Signed)
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 [name]. 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 Margins and lymph nodes negative. -she received adjuvant CAPOX 03/14/21 - 05/23/21 to reduce her risk of recurrence. she had additional Xeloda alone to complete 6 months of treatment.  -surveillance colonoscopy on 06/21/21 with Dr. Leonides Schanz was unremarkable. Plan for repeat in 1 year. -restaging CT AP on 08/11/21 showed: a soft tissue nodule favored to be related to left ovary, but adenopathy not completely excluded. -Due to rising CEA, we repeated CT on 08/17/2022 which showed enlarging rounded lesion along the LEFT iliac vessels, now measuring 4.2cm, concerning for cancer recurrence. -PET from 09/05/22 showed Intensely hypermetabolic mass in the LEFT pelvis is consistent with metastatic lymphadenopathy or local recurrence of colorectal carcinoma. -pt was referred to Dr. Glennis Brink at [facility] and she recommended neoadjuvant RT, I recommend with concurrent Xeloda. She started on 10/10/2022 and finished on 11/15/2022. She tolerated treatment very well and the tumor marker CEA dropped down to normal again.   -She underwent surgery on December 29, 2022. No residual disease    Assessment and Plan     Colon Cancer Post-surgery follow-up for colon cancer. Surgery on December 29, 2022, included removal of ovaries and appendix. No residual cancer cells or lymph node involvement. Experiencing frequent bowel movements, common post-surgery. Blood counts slightly low but well-managed. Pending CEA results. Emphasized importance of monitoring CEA levels for recurrence. Last scan in August; next scan due in two months. Regular follow-ups and scans every six months are essential. - Monitor bowel movements; consider Imodium for symptomatic relief - Reduce high fiber intake to manage bowel symptoms - Encourage diet with carbohydrates and lean protein - Order CT scan in two months - Perform lab tests and CT scan one week before next visit - Schedule follow-up visit in two months - Continue monitoring tumor markers and perform scans every six months - Report any unexplained pain, weight loss, or other concerning symptoms  Hot Flashes Experiencing hot flashes post-ovary removal. Hesitant to use hormone replacement therapy due to cancer risk concerns. Symptoms not severe enough for immediate intervention. Discussed alternative treatments and importance of calcium and vitamin D supplementation for bone health. - Discuss alternative treatments for hot flashes with gynecologist - Consider calcium and vitamin D supplementation  General Health Maintenance Discussed importance of bone health post-ovary removal. - Recommend calcium and vitamin D supplementation.     Plan -I reviewed her surgical sample, she had complete response -Plan to proceed with cancer surveillance -Follow-up in 2 months with lab, flush and CT chest, abdomen pelvis a week before    SUMMARY OF ONCOLOGIC HISTORY: Oncology History Overview Note   Cancer Staging  Cancer of sigmoid colon [facility]) Staging form: Colon and Rectum, AJCC 8th Edition - Pathologic stage from 02/10/2021: Stage IIB (pT4a, pN0, cM0) - Signed by Malachy Mood, MD on  02/28/2021  Cancer of sigmoid colon (HCC)  11/27/2020 Imaging   CLINICAL DATA:  Abdominal pain that is began Monday. Patient reports seen scant amounts of dark blood in stool. Feels bloated.   EXAM: CT ABDOMEN AND PELVIS WITH CONTRAST  IMPRESSION: 1. Proctocolitis involving the mid and distal sigmoid colon and upper rectum with a 3.9 cm gas containing interloop abscess centered in the sigmoid mesocolon. 2. Severe fecal burden.  No findings of bowel obstruction. 3. An enlarged left common iliac lymph node is likely reactive. 4. A 2.7 cm hemangioma is noted in the right hepatic lobe. 5. Mild focal bronchiolitis in the right lower lobe.   02/07/2021 Imaging   CLINICAL DATA:  Abdominal pain, cramping, constipation   EXAM: CT ABDOMEN AND PELVIS WITH CONTRAST  IMPRESSION: 1. There is severe, circumferential wall thickening and mucosal hyperenhancement involving the distal sigmoid colon and rectum to the anus, generally tethered appearance unchanged, involving at least 15-20 cm of distal sigmoid and rectum. 2. There is again seen an abscess at the medial aspect of the rectosigmoid junction measuring approximately 3.0 x 1.6 cm. 3. Findings are generally consistent with nonspecific infectious, inflammatory, or ischemic colitis, including inflammatory bowel disease such as Crohn's disease or ulcerative colitis; however an underlying malignancy is very difficult to exclude, particularly given substantial wall thickening. 4. Large burden of stool throughout the extremely redundant proximal colon, likely reflecting some degree of obstipation. 5. Enlarged left external iliac lymph nodes measuring up to 1.1 x 0.9 cm fall, nonspecific, possibly reactive, however nodal metastatic disease is not excluded per above.   02/09/2021 Initial Biopsy   FINAL MICROSCOPIC DIAGNOSIS:   A. COLON MASS, SIGMOID, BIOPSY:  - Adenomatous lesion with extensive high-grade dysplasia, see comment   B. RECTAL,  COLON, SIGMOID, BIOPSY:  - Mild acute/ subacute nonspecific colitis, see comment  - Negative for definite features of chronicity, granulomas or dysplasia   COMMENT:  A.  Evidence of invasive carcinoma is not seen in the submitted biopsies but the biopsy fragments appear superficial and a more severe deeper process cannot be ruled out.  B.  Histologic features in the rectosigmoid biopsies are not compatible with untreated inflammatory bowel disease.  Differential diagnosis can include infection, drug-effect and stercoral proctitis among other possibilities.    02/09/2021 Procedure   Colonoscopy, Dr. Levora Angel  Impression: - Preparation of the colon was fair. - Likely malignant partially obstructing tumor in the distal sigmoid colon. Biopsied. Tattooed. - Congested, inflamed and thickened folds of the mucosa in the recto-sigmoid colon. Biopsied. Tattooed.   02/10/2021 Cancer Staging   Staging form: Colon and Rectum, AJCC 8th Edition - Pathologic stage from 02/10/2021: Stage IIB (pT4a, pN0, cM0) - Signed by Malachy Mood, MD on 02/28/2021 Stage prefix: Initial diagnosis Total positive nodes: 0 Histologic grading system: 4 grade system Histologic grade (G): G2 Residual tumor (R): R0 - None   02/10/2021 Definitive Surgery   FINAL MICROSCOPIC DIAGNOSIS:   A. COLON, SIGMOID:  - Invasive moderately differentiated adenocarcinoma.  - Twenty-seven lymph nodes, negative for metastatic carcinoma (0/27).  - See oncology table below.   B. COLON, DISTAL, SIGMOID:  - Segment of colon with serosal adhesions, acute inflammation, and tattoo pigment.  - Negative for malignancy.    02/11/2021 Initial Diagnosis   Cancer of sigmoid colon (HCC)   03/14/2021 - 05/23/2021 Chemotherapy   Patient is on Treatment Plan : COLORECTAL Xelox (Capeox) q21d     04/20/2021 Genetic Testing   Negative hereditary cancer genetic testing: no pathogenic variants  detected in [place] CustomNext-Cancer +RNAinsight Panel.  Report date  is April 20, 2021.   The CustomNext-Cancer+RNAinsight panel offered by [employer] includes sequencing and rearrangement analysis for the following 47 genes:  APC, ATM, AXIN2, BARD1, BMPR1A, BRCA1, BRCA2, BRIP1, CDH1, CDK4, CDKN2A, CHEK2, DICER1, EPCAM, GREM1, HOXB13, MEN1, MLH1, MSH2, MSH3, MSH6, MUTYH, NBN, NF1, NF2, NTHL1, PALB2, PMS2, POLD1, POLE, PTEN, RAD51C, RAD51D, RECQL, RET, SDHA, SDHAF2, SDHB, SDHC, SDHD, SMAD4, SMARCA4, STK11, TP53, TSC1, TSC2, and VHL.  RNA data is routinely analyzed for use in variant interpretation for all genes.    02/20/2022 Imaging    IMPRESSION: 1. Similar postsurgical changes of partial sigmoidectomy with Hartmann's pouch formation and left anterior abdominal wall colostomy. No noncontrast CT evidence of local recurrence. New nonobstructive bowel/fluid containing peristomal hernia. 2. No convincing evidence of metastatic disease within the chest, abdomen, or pelvis on this noncontrast enhanced examination. 3. New multifocal subpleural foci of nodularity correspond with regions of atelectasis seen on prior imaging, favored to reflect subsegmental atelectasis. Stable 3 mm right lower lobe pulmonary nodule, favored benign. Consider attention on dedicated short-term interval follow-up chest CT. 4. Stable soft tissue along the left iliac vessels again favored to reflect left ovary given its continuity with the gonadal vessels but difficult to evaluate given lack of intravenous contrast material. Correlation with CEA levels and continued attention on follow-up imaging suggested. 5. Moderate volume of formed stool throughout the colon. Correlate for constipation.      Discussed the use of AI scribe software for clinical note transcription with the patient, who gave verbal consent to proceed.  History of Present Illness   The patient is a 48 year old female with a history of colon cancer who presents for a follow-up visit after surgery. She reports a  good response to radiation and chemotherapy, with no residual cancer cells or lymph node involvement noted during surgery. Her ovaries and appendix were removed during the procedure.  Postoperatively, she has been experiencing frequent bowel movements, particularly after eating. She has been consuming a high-fiber diet and is concerned about the impact on her bowel movements and her ability to regain weight. She also reports hot flashes since the removal of her ovaries.  She has no pain in the surgical area and reports that the incision is healing well. She has a family history of breast cancer and is concerned about hormone replacement therapy for her hot flashes due to the risk of cancer.         All other systems were reviewed with the patient and are negative.  MEDICAL HISTORY:  Past Medical History:  Diagnosis Date   Anemia    Anxiety    Colon cancer (HCC) 01/2021   stage 2   Complication of anesthesia    took a long time to wake up after surgery   Family history of breast cancer 03/30/2021   Family history of colon cancer 03/30/2021    SURGICAL HISTORY: Past Surgical History:  Procedure Laterality Date   ABDOMINAL HYSTERECTOMY Bilateral 09/26/2016   Procedure: HYSTERECTOMY ABDOMINAL W/ BILATERAL SALPINGECTOMY;  Surgeon: Allie Bossier, MD;  Location: [facility];  Service: Gynecology;  Laterality: Bilateral;   BIOPSY  02/09/2021   Procedure: BIOPSY;  Surgeon: Kathi Der, MD;  Location: [facility];  Service: Gastroenterology;;   BREAST BIOPSY Left    COLONOSCOPY N/A 02/09/2021   Procedure: COLONOSCOPY;  Surgeon: Kathi Der, MD;  Location: [facility];  Service: Gastroenterology;  Laterality: N/A;   LAPAROSCOPIC PARTIAL COLECTOMY N/A 02/10/2021  Procedure: ROBOTIC ASSISTED PARTIAL COLECTOMY WITH COLOSTOMY;  Surgeon: Luretha Murphy, MD;  Location: [facility];  Service: General;  Laterality: N/A;   SUBMUCOSAL TATTOO INJECTION  02/09/2021   Procedure: SUBMUCOSAL TATTOO  INJECTION;  Surgeon: Kathi Der, MD;  Location: [facility];  Service: Gastroenterology;;   TUBAL LIGATION      I have reviewed the social history and family history with the patient and they are unchanged from previous note.  ALLERGIES:  is allergic to ferrlecit [na ferric gluc cplx in sucrose].  MEDICATIONS:  Current Outpatient Medications  Medication Sig Dispense Refill   acetaminophen (TYLENOL) 500 MG tablet Take 2 tablets (1,000 mg total) by mouth every 6 (six) hours as needed for mild pain. 30 tablet 0   capecitabine (XELODA) 500 MG tablet Take 3 tablets (1,500 mg total) by mouth 2 (two) times daily after a meal. Take the days of radiation, Monday through Friday, off on weekends 90 tablet 1   estradiol (CLIMARA - DOSED IN MG/24 HR) 0.1 mg/24hr patch Place 1 patch (0.1 mg total) onto the skin once a week. 4 patch 12   metroNIDAZOLE (FLAGYL) 500 MG tablet Take 1 tablet (500 mg total) by mouth 2 (two) times daily. (Patient not taking: Reported on 09/26/2022) 14 tablet 0   ondansetron (ZOFRAN) 8 MG tablet Take 1 tablet (8 mg total) by mouth every 8 (eight) hours as needed for refractory nausea / vomiting. Start on day 3 after chemotherapy. 30 tablet 1   pantoprazole (PROTONIX) 40 MG tablet Take 40 mg by mouth daily as needed. (Patient not taking: Reported on 09/26/2022)     polyethylene glycol (MIRALAX / GLYCOLAX) 17 g packet Take 17 g by mouth daily. (Patient not taking: Reported on 09/26/2022) 14 each 0   No current facility-administered medications for this visit.    PHYSICAL EXAMINATION: ECOG PERFORMANCE STATUS: 0 - Asymptomatic  Vitals:   02/22/23 1432  BP: 105/80  Pulse: 80  Resp: 17  Temp: 97.7 F (36.5 C)  SpO2: 100%   Wt Readings from Last 3 Encounters:  02/22/23 135 lb 14.4 oz (61.6 kg)  12/20/22 147 lb 4.8 oz (66.8 kg)  11/14/22 151 lb 1.6 oz (68.5 kg)     GENERAL:alert, no distress and comfortable SKIN: skin color, texture, turgor are normal, no rashes or  significant lesions EYES: normal, Conjunctiva are pink and non-injected, sclera clear NECK: supple, thyroid normal size, non-tender, without nodularity LYMPH:  no palpable lymphadenopathy in the cervical, axillary  LUNGS: clear to auscultation and percussion with normal breathing effort HEART: regular rate & rhythm and no murmurs and no lower extremity edema ABDOMEN:abdomen soft, non-tender and normal bowel sounds, surgical incisions have healed well. Musculoskeletal:no cyanosis of digits and no clubbing  NEURO: alert & oriented x 3 with fluent speech, no focal motor/sensory deficits    LABORATORY DATA:  I have reviewed the data as listed    Latest Ref Rng & Units 02/22/2023    1:52 PM 12/20/2022    9:11 AM 11/14/2022    8:50 AM  CBC  WBC 4.0 - 10.5 K/uL 2.3  2.2  2.1   Hemoglobin 12.0 - 15.0 g/dL 96.0  45.4  09.8   Hematocrit 36.0 - 46.0 % 36.9  35.3  35.5   Platelets 150 - 400 K/uL 297  205  235         Latest Ref Rng & Units 02/22/2023    1:52 PM 12/20/2022    9:11 AM 11/14/2022    8:50  AM  CMP  Glucose 70 - 99 mg/dL 85  94  86   BUN 6 - 20 mg/dL 12  12  14    Creatinine 0.44 - 1.00 mg/dL 1.61  0.96  0.45   Sodium 135 - 145 mmol/L 141  141  142   Potassium 3.5 - 5.1 mmol/L 4.0  3.9  3.8   Chloride 98 - 111 mmol/L 107  109  108   CO2 22 - 32 mmol/L 28  26  28    Calcium 8.9 - 10.3 mg/dL 9.7  9.4  9.8   Total Protein 6.5 - 8.1 g/dL 8.0  7.8  7.9   Total Bilirubin 0.0 - 1.2 mg/dL 0.5  0.5  0.6   Alkaline Phos 38 - 126 U/L 53  61  59   AST 15 - 41 U/L 15  15  16    ALT 0 - 44 U/L 5  6  7        RADIOGRAPHIC STUDIES: I have personally reviewed the radiological images as listed and agreed with the findings in the report. No results found.    Orders Placed This Encounter  Procedures   CT CHEST ABDOMEN PELVIS W CONTRAST    Standing Status:   Future    Expected Date:   04/13/2023    Expiration Date:   02/23/2024    If indicated for the ordered procedure, I authorize the  administration of contrast media per Radiology protocol:   Yes    Does the patient have a contrast media/X-ray dye allergy?:   No    Is patient pregnant?:   No    Preferred imaging location?:   [facility]    If indicated for the ordered procedure, I authorize the administration of oral contrast media per Radiology protocol:   Yes   All questions were answered. The patient knows to call the clinic with any problems, questions or concerns. No barriers to learning was detected. The total time spent in the appointment was 25 minutes.     Malachy Mood, MD 02/22/2023

## 2023-03-23 ENCOUNTER — Ambulatory Visit
Admission: EM | Admit: 2023-03-23 | Discharge: 2023-03-23 | Disposition: A | Discharge status: Home / Self Care | Payer: Medicaid Other | Attending: Family Medicine | Admitting: Family Medicine

## 2023-03-23 ENCOUNTER — Ambulatory Visit: Payer: Medicaid Other

## 2023-03-23 DIAGNOSIS — R140 Abdominal distension (gaseous): Secondary | ICD-10-CM | POA: Diagnosis not present

## 2023-03-23 DIAGNOSIS — K567 Ileus, unspecified: Secondary | ICD-10-CM | POA: Diagnosis not present

## 2023-03-23 DIAGNOSIS — R109 Unspecified abdominal pain: Secondary | ICD-10-CM

## 2023-03-23 NOTE — ED Triage Notes (Addendum)
 [facility]d pain and cramping with diarrhea. Pt reports she was constipated since Saturday and she took miralax  on Sunday to help go and since then she has had pain and loose stools.   Pt reports she had colon cancer and had a colostomy bag that was revered in December. Pt reports she goes back to her surgeon soon.

## 2023-03-23 NOTE — ED Provider Notes (Signed)
 [facility]    CSN: [id number] Arrival date & time: 03/23/23  4132      History   Chief Complaint Chief Complaint  Patient presents with   Constipation   Diarrhea   Abdominal Pain    HPI Barbara Valdez is a 48 y.o. female.   Eight days ago patient became constipated.  Three days later she took a dose of Miralax resulting in frequent loose stools. During the past two days her bowel movements have decreased but she has developed abdominal bloating and cramping which becomes worse about an hour after eating.  She denies melena and hematochezia.  She denies nausea/vomiting, and fevers, chills, and sweats. 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  On 12/29/22 she underwent resection of her appendix and ovaries.  She is presently scheduled to follow-up with her oncologist next month for lab, and CT chest/abdomen/pelvis.  The history is provided by the patient.    Past Medical History:  Diagnosis Date   Anemia    Anxiety    Colon cancer (HCC) 01/2021   stage 2   Complication of anesthesia    took a long time to wake up after surgery   Family history of breast cancer 03/30/2021   Family history of colon cancer 03/30/2021    Patient Active Problem List   Diagnosis Date Noted   Type 2 diabetes mellitus without complication, without long-term current use of insulin (HCC) 08/16/2022   Genetic testing 04/28/2021   Family history of breast cancer 03/30/2021   Family history of colon cancer 03/30/2021   Cancer of sigmoid colon (HCC) 02/11/2021   Colitis 02/07/2021   Constipation 12/01/2020   Colitis with rectal bleeding 11/27/2020   Post-operative state 09/26/2016   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   Menorrhagia with regular cycle 07/18/2016   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   Iron deficiency anemia 07/18/2016    Past Surgical History:  Procedure Laterality Date   ABDOMINAL HYSTERECTOMY Bilateral 09/26/2016    Procedure: HYSTERECTOMY ABDOMINAL W/ BILATERAL SALPINGECTOMY;  Surgeon: Allie Bossier, MD;  Location: [facility];  Service: Gynecology;  Laterality: Bilateral;   BIOPSY  02/09/2021   Procedure: BIOPSY;  Surgeon: Kathi Der, MD;  Location: [facility];  Service: Gastroenterology;;   BREAST BIOPSY Left    COLONOSCOPY N/A 02/09/2021   Procedure: COLONOSCOPY;  Surgeon: Kathi Der, MD;  Location: [facility];  Service: Gastroenterology;  Laterality: N/A;   LAPAROSCOPIC PARTIAL COLECTOMY N/A 02/10/2021   Procedure: ROBOTIC ASSISTED PARTIAL COLECTOMY WITH COLOSTOMY;  Surgeon: Luretha Murphy, MD;  Location: [facility];  Service: General;  Laterality: N/A;   SUBMUCOSAL TATTOO INJECTION  02/09/2021   Procedure: SUBMUCOSAL TATTOO INJECTION;  Surgeon: Kathi Der, MD;  Location: [facility];  Service: Gastroenterology;;   TUBAL LIGATION      OB History     Gravida  2   Para  2   Term  2   Preterm      AB      Living  2      SAB      IAB      Ectopic      Multiple      Live Births  2            Home Medications    Prior to Admission medications   Medication Sig Start Date End Date Taking? Authorizing Provider  acetaminophen (TYLENOL) 500 MG tablet Take 2 tablets (1,000 mg total) by mouth  every 6 (six) hours as needed for mild pain. 02/15/21   Meuth, Lina Sar, PA-C  capecitabine (XELODA) 500 MG tablet Take 3 tablets (1,500 mg total) by mouth 2 (two) times daily after a meal. Take the days of radiation, Monday through Friday, off on weekends 09/26/22   Malachy Mood, MD  estradiol (CLIMARA - DOSED IN MG/24 HR) 0.1 mg/24hr patch Place 1 patch (0.1 mg total) onto the skin once a week. 01/25/23     metroNIDAZOLE (FLAGYL) 500 MG tablet Take 1 tablet (500 mg total) by mouth 2 (two) times daily. Patient not taking: Reported on 09/26/2022 09/05/22   Allie Bossier, MD  ondansetron (ZOFRAN) 8 MG tablet Take 1 tablet (8 mg total) by mouth every 8 (eight) hours as needed for refractory  nausea / vomiting. Start on day 3 after chemotherapy. 10/10/22   Malachy Mood, MD  pantoprazole (PROTONIX) 40 MG tablet Take 40 mg by mouth daily as needed. Patient not taking: Reported on 09/26/2022 05/01/21   [provider]  polyethylene glycol (MIRALAX / GLYCOLAX) 17 g packet Take 17 g by mouth daily. Patient not taking: Reported on 09/26/2022 02/16/21   Rolly Salter, MD    Family History Family History  Problem Relation Age of Onset   Hyperlipidemia Father    Breast cancer Maternal Aunt    Lymphoma Maternal Aunt    Colon cancer Maternal Uncle 64   Lung cancer Maternal Uncle    Lymphoma Paternal Grandmother        d. 55   Breast cancer Cousin 78       maternal female cousin   Hypertension Other    Cancer Other        PGF's mother; d. early 6s; unknown type; mets    Social History Social History   Tobacco Use   Smoking status: Never   Smokeless tobacco: Never  Vaping Use   Vaping status: Never Used  Substance Use Topics   Alcohol use: Never   Drug use: No     Allergies   Ferrlecit [na ferric gluc cplx in sucrose]   Review of Systems Review of Systems  Constitutional:  Negative for activity change, appetite change, chills, fatigue and fever.  HENT: Negative.    Eyes: Negative.   Respiratory: Negative.    Cardiovascular: Negative.   Gastrointestinal:  Positive for abdominal pain and diarrhea. Negative for abdominal distention, blood in stool, constipation, nausea, rectal pain and vomiting.  Genitourinary: Negative.   Musculoskeletal: Negative.   Skin: Negative.   Neurological:  Negative for headaches.     Physical Exam Triage Vital Signs ED Triage Vitals  Encounter Vitals Group     BP 03/23/23 1049 100/71     Systolic BP Percentile --      Diastolic BP Percentile --      Pulse Rate 03/23/23 1049 76     Resp 03/23/23 1049 16     Temp 03/23/23 1049 98.1 F (36.7 C)     Temp src --      SpO2 03/23/23 1049 98 %     Weight 03/23/23 1146 133 lb 11.2  oz (60.6 kg)     Height --      Head Circumference --      Peak Flow --      Pain Score 03/23/23 1047 7     Pain Loc --      Pain Education --      Exclude from Growth Chart --  No data found.  Updated Vital Signs BP 100/71   Pulse 76   Temp 98.1 F (36.7 C)   Resp 16   Wt 60.6 kg   LMP 09/26/2016   SpO2 98%   BMI 21.58 kg/m   Visual Acuity Right Eye Distance:   Left Eye Distance:   Bilateral Distance:    Right Eye Near:   Left Eye Near:    Bilateral Near:     Physical Exam Nursing notes and Vital Signs reviewed. Appearance:  Patient appears stated age, and in no acute distress.    Eyes:  Pupils are equal, round, and reactive to light and accomodation.  Extraocular movement is intact.  Conjunctivae are not inflamed   Pharynx:  Normal; moist mucous membranes  Neck:  Supple.  No adenopathy Lungs:  Clear to auscultation.  Breath sounds are equal.  Moving air well. Heart:  Regular rate and rhythm without murmurs, rubs, or gallops.  Abdomen:  Mild centralized tenderness without masses or hepatosplenomegaly.  Bowel sounds are increased.  No CVA or flank tenderness.  Extremities:  No edema.  Skin:  No rash present.     UC Treatments / Results  Labs (all labs ordered are listed, but only abnormal results are displayed) Labs Reviewed  CBC WITH DIFFERENTIAL/PLATELET  COMPREHENSIVE METABOLIC PANEL    EKG   Radiology DG Abd 2 Views Result Date: 03/23/2023 CLINICAL DATA:  Abdominal pain and cramping. EXAM: ABDOMEN - 2 VIEW COMPARISON:  PET-CT 09/05/2022 FINDINGS: Lung bases are clear. Postsurgical changes overlie the pelvis. Stool within the descending colon. Mildly prominent gas-filled loops of small bowel central abdomen. No free air. Lumbar spine degenerative changes. IMPRESSION: Mildly prominent gas-filled loops of small bowel central abdomen may represent ileus. Electronically Signed   By: Annia Belt M.D.   On: 03/23/2023 12:11    Procedures Procedures  (including critical care time)  Medications Ordered in UC Medications - No data to display  Initial Impression / Assessment and Plan / UC Course  I have reviewed the triage vital signs and the nursing notes.  Pertinent labs & imaging results that were available during my care of the patient were reviewed by me and considered in my medical decision making (see chart for details).    Possible ileus on 2-view abdomen x-rays suggests possibility of adhesions as a result of her recent surgery. No evidence of obstruction at this time, but patient needs close observation.  Advised clear liquids for now.  Check CMP and CBC. Patient advised to follow-up with her oncologist as soon as possible.   Final Clinical Impressions(s) / UC Diagnoses   Final diagnoses:  Abdominal bloating with cramps  Ileus of unspecified type [facility])     Discharge Instructions      Begin clear liquids for about 24 hours, then may begin a BRAT diet (Bananas, Rice, Applesauce, Toast) when abdominal bloating improved. Then gradually advance to a regular diet as tolerated.     If symptoms become significantly worse (such as persistent nausea/vomiting and increasing abdominal pain) during the night or over the weekend, proceed to the local emergency room.     ED Prescriptions   None       Lattie Haw, MD 03/24/23 [phone]

## 2023-03-23 NOTE — Discharge Instructions (Addendum)
 Begin clear liquids for about 24 hours, then may begin a BRAT diet (Bananas, Rice, Applesauce, Toast) when abdominal bloating improved. Then gradually advance to a regular diet as tolerated.     If symptoms become significantly worse (such as persistent nausea/vomiting and increasing abdominal pain) during the night or over the weekend, proceed to the local emergency room.

## 2023-03-24 ENCOUNTER — Telehealth: Payer: Self-pay

## 2023-03-24 LAB — CBC WITH DIFFERENTIAL/PLATELET
Basophils Absolute: 0 10*3/uL (ref 0.0–0.2)
Basos: 1 %
EOS (ABSOLUTE): 0.1 10*3/uL (ref 0.0–0.4)
Eos: 5 %
Hematocrit: 34.5 % (ref 34.0–46.6)
Hemoglobin: 11 g/dL — ABNORMAL LOW (ref 11.1–15.9)
Immature Grans (Abs): 0 10*3/uL (ref 0.0–0.1)
Immature Granulocytes: 0 %
Lymphocytes Absolute: 0.7 10*3/uL (ref 0.7–3.1)
Lymphs: 31 %
MCH: 25.9 pg — ABNORMAL LOW (ref 26.6–33.0)
MCHC: 31.9 g/dL (ref 31.5–35.7)
MCV: 81 fL (ref 79–97)
Monocytes Absolute: 0.3 10*3/uL (ref 0.1–0.9)
Monocytes: 15 %
Neutrophils Absolute: 1.1 10*3/uL — ABNORMAL LOW (ref 1.4–7.0)
Neutrophils: 48 %
Platelets: 330 10*3/uL (ref 150–450)
RBC: 4.25 x10E6/uL (ref 3.77–5.28)
RDW: 13.4 % (ref 11.7–15.4)
WBC: 2.3 10*3/uL — CL (ref 3.4–10.8)

## 2023-03-24 LAB — COMPREHENSIVE METABOLIC PANEL
ALT: 8 IU/L (ref 0–32)
AST: 15 IU/L (ref 0–40)
Albumin: 4.4 g/dL (ref 3.9–4.9)
Alkaline Phosphatase: 68 IU/L (ref 44–121)
BUN/Creatinine Ratio: 16 (ref 9–23)
BUN: 12 mg/dL (ref 6–24)
Bilirubin Total: 0.3 mg/dL (ref 0.0–1.2)
CO2: 24 mmol/L (ref 20–29)
Calcium: 9.8 mg/dL (ref 8.7–10.2)
Chloride: 104 mmol/L (ref 96–106)
Creatinine, Ser: 0.73 mg/dL (ref 0.57–1.00)
Globulin, Total: 3.2 g/dL (ref 1.5–4.5)
Glucose: 94 mg/dL (ref 70–99)
Potassium: 4.4 mmol/L (ref 3.5–5.2)
Sodium: 142 mmol/L (ref 134–144)
Total Protein: 7.6 g/dL (ref 6.0–8.5)
eGFR: 103 mL/min/{1.73_m2} (ref >59)

## 2023-03-24 NOTE — Telephone Encounter (Addendum)
 Pt wbc 2.3. Per [facility], pt wbc count at baseline given hx of cancer.

## 2023-04-13 ENCOUNTER — Ambulatory Visit (HOSPITAL_COMMUNITY)
Admission: RE | Admit: 2023-04-13 | Discharge: 2023-04-13 | Disposition: A | Discharge status: Home / Self Care | Payer: Medicaid Other | Source: Ambulatory Visit | Attending: Hematology | Admitting: Hematology

## 2023-04-13 DIAGNOSIS — C187 Malignant neoplasm of sigmoid colon: Secondary | ICD-10-CM | POA: Diagnosis present

## 2023-04-13 MED ORDER — IOHEXOL 9 MG/ML PO SOLN
500.0000 mL | ORAL | Status: AC
  Administered 2023-04-13 (×2): 500 mL via ORAL

## 2023-04-13 MED ORDER — IOHEXOL 9 MG/ML PO SOLN
ORAL | Status: AC
Start: 2023-04-13 — End: ?
  Filled 2023-04-13: qty 1000

## 2023-04-13 MED ORDER — IOHEXOL 300 MG/ML  SOLN
100.0000 mL | Freq: Once | INTRAMUSCULAR | Status: AC | PRN
  Administered 2023-04-13: 100 mL via INTRAVENOUS

## 2023-04-16 ENCOUNTER — Telehealth: Payer: Self-pay | Admitting: Hematology

## 2023-04-16 NOTE — Telephone Encounter (Signed)
 Per staff message on 04/16/2023 patient is aware of scheduled appointment times/dates per provider request

## 2023-04-17 NOTE — Assessment & Plan Note (Signed)
 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 [name]. 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 Margins and lymph nodes negative. -she received adjuvant CAPOX 03/14/21 - 05/23/21 to reduce her risk of recurrence. she had additional Xeloda alone to complete 6 months of treatment.  -surveillance colonoscopy on 06/21/21 with Dr. Leonides Schanz was unremarkable. Plan for repeat in 1 year. -restaging CT AP on 08/11/21 showed: a soft tissue nodule favored to be related to left ovary, but adenopathy not completely excluded. -Due to rising CEA, we repeated CT on 08/17/2022 which showed enlarging rounded lesion along the LEFT iliac vessels, now measuring 4.2cm, concerning for cancer recurrence. -PET from 09/05/22 showed Intensely hypermetabolic mass in the LEFT pelvis is consistent with metastatic lymphadenopathy or local recurrence of colorectal carcinoma. -pt was referred to Dr. Glennis Brink at [facility] and she recommended neoadjuvant RT, I recommend with concurrent Xeloda. She started on 10/10/2022 and finished on 11/15/2022. She tolerated treatment very well and the tumor marker CEA dropped down to normal again.   -She underwent surgery on December 29, 2022. No residual disease  -restaging CT 04/13/2023 showed post-op changes no evidence of recurrence

## 2023-04-18 ENCOUNTER — Inpatient Hospital Stay: Attending: Hematology | Admitting: Hematology

## 2023-04-18 DIAGNOSIS — C187 Malignant neoplasm of sigmoid colon: Secondary | ICD-10-CM | POA: Insufficient documentation

## 2023-04-18 NOTE — Progress Notes (Signed)
 [facility]   [phone] [phone]   Clinic Follow up Note   Patient Care Team: Caudle, Shelton Silvas, FNP as PCP - General (Family Medicine) Luretha Murphy, MD as Consulting Physician (General Surgery) Kathi Der, MD as Consulting Physician (Gastroenterology) Malachy Mood, MD as Consulting Physician (Hematology) Allie Bossier, MD as Consulting Physician (Obstetrics and Gynecology) 04/18/2023  I connected with Barbara Valdez on 04/18/23 at 11:00 AM EDT by telephone and verified that I am speaking with the correct person using two identifiers.   I discussed the limitations, risks, security and privacy concerns of performing an evaluation and management service by telephone and the availability of in person appointments. I also discussed with the patient that there may be a patient responsible charge related to this service. The patient expressed understanding and agreed to proceed.   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 [name]. 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 Margins and lymph nodes negative. -she received adjuvant CAPOX 03/14/21 - 05/23/21 to reduce her risk of recurrence. she had additional Xeloda alone to complete 6 months of treatment.  -surveillance colonoscopy on 06/21/21 with Dr. Leonides Schanz was unremarkable. Plan for repeat in 1 year. -restaging CT AP on 08/11/21 showed: a soft tissue nodule favored to be related to left ovary, but adenopathy not completely excluded. -Due to rising CEA, we repeated CT on 08/17/2022 which showed enlarging rounded lesion along the LEFT iliac vessels, now measuring 4.2cm,  concerning for cancer recurrence. -PET from 09/05/22 showed Intensely hypermetabolic mass in the LEFT pelvis is consistent with metastatic lymphadenopathy or local recurrence of colorectal carcinoma. -pt was referred to Dr. Glennis Brink at [facility] and she recommended neoadjuvant RT, I recommend with concurrent Xeloda. She started on 10/10/2022 and finished on 11/15/2022. She tolerated treatment very well and the tumor marker CEA dropped down to normal again.   -She underwent surgery on December 29, 2022. No residual disease  -restaging CT 04/13/2023 showed post-op changes no evidence of recurrence   Assessment and Plan    Metastatic colon cancer Metastatic colon cancer with previous rectal sigmoid colon resection and chemotherapy. Recent CT scan shows no definitive evidence of recurrence, with surgical changes in the pelvic area. Tumor markers were negative two months ago. She reports difficulty gaining weight post-surgery and chemotherapy, occasional gassiness, but no abdominal pain or bleeding. - Schedule CT scan every three months to monitor for recurrence - Perform lab tests including tumor markers at the next visit - Advise monitoring for symptoms such as constipation, bleeding, nausea, abdominal discomfort, and weight loss - Schedule follow-up office visit in three months with scan and lab tests a week prior      Plan -Lab and CT scanslab and CT reviewed, NED -f/u in 3 months with lab and CT AP w contrast one week before    SUMMARY OF ONCOLOGIC HISTORY: Oncology History Overview Note   Cancer Staging  Cancer of sigmoid colon [facility]) Staging form: Colon and Rectum, AJCC 8th Edition - Pathologic stage from 02/10/2021: Stage IIB (pT4a, pN0, cM0) - Signed by Malachy Mood, MD on 02/28/2021    Cancer of sigmoid colon (HCC)  11/27/2020 Imaging   CLINICAL DATA:  Abdominal pain that is began Monday. Patient  reports seen scant amounts of dark blood in stool. Feels bloated.   EXAM: CT ABDOMEN AND PELVIS  WITH CONTRAST  IMPRESSION: 1. Proctocolitis involving the mid and distal sigmoid colon and upper rectum with a 3.9 cm gas containing interloop abscess centered in the sigmoid mesocolon. 2. Severe fecal burden.  No findings of bowel obstruction. 3. An enlarged left common iliac lymph node is likely reactive. 4. A 2.7 cm hemangioma is noted in the right hepatic lobe. 5. Mild focal bronchiolitis in the right lower lobe.   02/07/2021 Imaging   CLINICAL DATA:  Abdominal pain, cramping, constipation   EXAM: CT ABDOMEN AND PELVIS WITH CONTRAST  IMPRESSION: 1. There is severe, circumferential wall thickening and mucosal hyperenhancement involving the distal sigmoid colon and rectum to the anus, generally tethered appearance unchanged, involving at least 15-20 cm of distal sigmoid and rectum. 2. There is again seen an abscess at the medial aspect of the rectosigmoid junction measuring approximately 3.0 x 1.6 cm. 3. Findings are generally consistent with nonspecific infectious, inflammatory, or ischemic colitis, including inflammatory bowel disease such as Crohn's disease or ulcerative colitis; however an underlying malignancy is very difficult to exclude, particularly given substantial wall thickening. 4. Large burden of stool throughout the extremely redundant proximal colon, likely reflecting some degree of obstipation. 5. Enlarged left external iliac lymph nodes measuring up to 1.1 x 0.9 cm fall, nonspecific, possibly reactive, however nodal metastatic disease is not excluded per above.   02/09/2021 Initial Biopsy   FINAL MICROSCOPIC DIAGNOSIS:   A. COLON MASS, SIGMOID, BIOPSY:  - Adenomatous lesion with extensive high-grade dysplasia, see comment   B. RECTAL, COLON, SIGMOID, BIOPSY:  - Mild acute/ subacute nonspecific colitis, see comment  - Negative for definite features of chronicity, granulomas or dysplasia   COMMENT:  A.  Evidence of invasive carcinoma is not seen in the  submitted biopsies but the biopsy fragments appear superficial and a more severe deeper process cannot be ruled out.  B.  Histologic features in the rectosigmoid biopsies are not compatible with untreated inflammatory bowel disease.  Differential diagnosis can include infection, drug-effect and stercoral proctitis among other possibilities.    02/09/2021 Procedure   Colonoscopy, Dr. Levora Angel  Impression: - Preparation of the colon was fair. - Likely malignant partially obstructing tumor in the distal sigmoid colon. Biopsied. Tattooed. - Congested, inflamed and thickened folds of the mucosa in the recto-sigmoid colon. Biopsied. Tattooed.   02/10/2021 Cancer Staging   Staging form: Colon and Rectum, AJCC 8th Edition - Pathologic stage from 02/10/2021: Stage IIB (pT4a, pN0, cM0) - Signed by Malachy Mood, MD on 02/28/2021 Stage prefix: Initial diagnosis Total positive nodes: 0 Histologic grading system: 4 grade system Histologic grade (G): G2 Residual tumor (R): R0 - None   02/10/2021 Definitive Surgery   FINAL MICROSCOPIC DIAGNOSIS:   A. COLON, SIGMOID:  - Invasive moderately differentiated adenocarcinoma.  - Twenty-seven lymph nodes, negative for metastatic carcinoma (0/27).  - See oncology table below.   B. COLON, DISTAL, SIGMOID:  - Segment of colon with serosal adhesions, acute inflammation, and tattoo pigment.  - Negative for malignancy.    02/11/2021 Initial Diagnosis   Cancer of sigmoid colon (HCC)   03/14/2021 - 05/23/2021 Chemotherapy   Patient is on Treatment Plan : COLORECTAL Xelox (Capeox) q21d     04/20/2021 Genetic Testing   Negative hereditary cancer genetic testing: no pathogenic variants detected in [employer] CustomNext-Cancer +RNAinsight Panel.  Report date is April 20, 2021.   The CustomNext-Cancer+RNAinsight panel offered  by [employer] includes sequencing and rearrangement analysis for the following 47 genes:  APC, ATM, AXIN2, BARD1, BMPR1A, BRCA1, BRCA2, BRIP1, CDH1,  CDK4, CDKN2A, CHEK2, DICER1, EPCAM, GREM1, HOXB13, MEN1, MLH1, MSH2, MSH3, MSH6, MUTYH, NBN, NF1, NF2, NTHL1, PALB2, PMS2, POLD1, POLE, PTEN, RAD51C, RAD51D, RECQL, RET, SDHA, SDHAF2, SDHB, SDHC, SDHD, SMAD4, SMARCA4, STK11, TP53, TSC1, TSC2, and VHL.  RNA data is routinely analyzed for use in variant interpretation for all genes.    02/20/2022 Imaging    IMPRESSION: 1. Similar postsurgical changes of partial sigmoidectomy with Hartmann's pouch formation and left anterior abdominal wall colostomy. No noncontrast CT evidence of local recurrence. New nonobstructive bowel/fluid containing peristomal hernia. 2. No convincing evidence of metastatic disease within the chest, abdomen, or pelvis on this noncontrast enhanced examination. 3. New multifocal subpleural foci of nodularity correspond with regions of atelectasis seen on prior imaging, favored to reflect subsegmental atelectasis. Stable 3 mm right lower lobe pulmonary nodule, favored benign. Consider attention on dedicated short-term interval follow-up chest CT. 4. Stable soft tissue along the left iliac vessels again favored to reflect left ovary given its continuity with the gonadal vessels but difficult to evaluate given lack of intravenous contrast material. Correlation with CEA levels and continued attention on follow-up imaging suggested. 5. Moderate volume of formed stool throughout the colon. Correlate for constipation.     Discussed the use of AI scribe software for clinical note transcription with the patient, who gave verbal consent to proceed.  History of Present Illness   Findley, a 48 year old female with a history of metastatic colon cancer, presents for a virtual follow-up visit. She reports a recent episode of constipation, which was evaluated with an abdominal x-ray. The x-ray revealed constipation, and she was advised to take Miralax. However, the Miralax upset her stomach, so she started taking a probiotic, which  resolved her symptoms and she resumed regular bowel movements. She denies any abdominal discomfort or bleeding. She also mentions being gassy and having difficulty gaining weight, despite her efforts. She reports that her weight has been stable since her surgeries and chemotherapy.         REVIEW OF SYSTEMS:   Constitutional: Denies fevers, chills or abnormal weight loss Eyes: Denies blurriness of vision Ears, nose, mouth, throat, and face: Denies mucositis or sore throat Respiratory: Denies cough, dyspnea or wheezes Cardiovascular: Denies palpitation, chest discomfort or lower extremity swelling Gastrointestinal:  Denies nausea, heartburn or change in bowel habits Skin: Denies abnormal skin rashes Lymphatics: Denies new lymphadenopathy or easy bruising Neurological:Denies numbness, tingling or new weaknesses Behavioral/Psych: Mood is stable, no new changes  All other systems were reviewed with the patient and are negative.  MEDICAL HISTORY:  Past Medical History:  Diagnosis Date   Anemia    Anxiety    Colon cancer (HCC) 01/2021   stage 2   Complication of anesthesia    took a long time to wake up after surgery   Family history of breast cancer 03/30/2021   Family history of colon cancer 03/30/2021    SURGICAL HISTORY: Past Surgical History:  Procedure Laterality Date   ABDOMINAL HYSTERECTOMY Bilateral 09/26/2016   Procedure: HYSTERECTOMY ABDOMINAL W/ BILATERAL SALPINGECTOMY;  Surgeon: Allie Bossier, MD;  Location: [facility];  Service: Gynecology;  Laterality: Bilateral;   BIOPSY  02/09/2021   Procedure: BIOPSY;  Surgeon: Kathi Der, MD;  Location: [facility];  Service: Gastroenterology;;   BREAST BIOPSY Left    COLONOSCOPY N/A 02/09/2021   Procedure: COLONOSCOPY;  Surgeon: Kathi Der,  MD;  Location: [facility];  Service: Gastroenterology;  Laterality: N/A;   LAPAROSCOPIC PARTIAL COLECTOMY N/A 02/10/2021   Procedure: ROBOTIC ASSISTED PARTIAL COLECTOMY WITH  COLOSTOMY;  Surgeon: Luretha Murphy, MD;  Location: [facility];  Service: General;  Laterality: N/A;   SUBMUCOSAL TATTOO INJECTION  02/09/2021   Procedure: SUBMUCOSAL TATTOO INJECTION;  Surgeon: Kathi Der, MD;  Location: [facility];  Service: Gastroenterology;;   TUBAL LIGATION      I have reviewed the social history and family history with the patient and they are unchanged from previous note.  ALLERGIES:  is allergic to ferrlecit [na ferric gluc cplx in sucrose].  MEDICATIONS:  Current Outpatient Medications  Medication Sig Dispense Refill   acetaminophen (TYLENOL) 500 MG tablet Take 2 tablets (1,000 mg total) by mouth every 6 (six) hours as needed for mild pain. 30 tablet 0   capecitabine (XELODA) 500 MG tablet Take 3 tablets (1,500 mg total) by mouth 2 (two) times daily after a meal. Take the days of radiation, Monday through Friday, off on weekends 90 tablet 1   estradiol (CLIMARA - DOSED IN MG/24 HR) 0.1 mg/24hr patch Place 1 patch (0.1 mg total) onto the skin once a week. 4 patch 12   metroNIDAZOLE (FLAGYL) 500 MG tablet Take 1 tablet (500 mg total) by mouth 2 (two) times daily. (Patient not taking: Reported on 09/26/2022) 14 tablet 0   ondansetron (ZOFRAN) 8 MG tablet Take 1 tablet (8 mg total) by mouth every 8 (eight) hours as needed for refractory nausea / vomiting. Start on day 3 after chemotherapy. 30 tablet 1   pantoprazole (PROTONIX) 40 MG tablet Take 40 mg by mouth daily as needed. (Patient not taking: Reported on 09/26/2022)     polyethylene glycol (MIRALAX / GLYCOLAX) 17 g packet Take 17 g by mouth daily. (Patient not taking: Reported on 09/26/2022) 14 each 0   No current facility-administered medications for this visit.    PHYSICAL EXAMINATION: Not performed   LABORATORY DATA:  I have reviewed the data as listed    Latest Ref Rng & Units 03/23/2023    1:14 PM 02/22/2023    1:52 PM 12/20/2022    9:11 AM  CBC  WBC 3.4 - 10.8 x10E3/uL 2.3  2.3  2.2   Hemoglobin 11.1 -  15.9 g/dL 16.1  09.6  04.5   Hematocrit 34.0 - 46.6 % 34.5  36.9  35.3   Platelets 150 - 450 x10E3/uL 330  297  205         Latest Ref Rng & Units 03/23/2023    1:14 PM 02/22/2023    1:52 PM 12/20/2022    9:11 AM  CMP  Glucose 70 - 99 mg/dL 94  85  94   BUN 6 - 24 mg/dL 12  12  12    Creatinine 0.57 - 1.00 mg/dL 4.09  8.11  9.14   Sodium 134 - 144 mmol/L 142  141  141   Potassium 3.5 - 5.2 mmol/L 4.4  4.0  3.9   Chloride 96 - 106 mmol/L 104  107  109   CO2 20 - 29 mmol/L 24  28  26    Calcium 8.7 - 10.2 mg/dL 9.8  9.7  9.4   Total Protein 6.0 - 8.5 g/dL 7.6  8.0  7.8   Total Bilirubin 0.0 - 1.2 mg/dL 0.3  0.5  0.5   Alkaline Phos 44 - 121 IU/L 68  53  61   AST 0 - 40 IU/L 15  15  15   ALT 0 - 32 IU/L 8  5  6        RADIOGRAPHIC STUDIES: I have personally reviewed the radiological images as listed and agreed with the findings in the report. No results found.     I discussed the assessment and treatment plan with the patient. The patient was provided an opportunity to ask questions and all were answered. The patient agreed with the plan and demonstrated an understanding of the instructions.   The patient was advised to call back or seek an in-person evaluation if the symptoms worsen or if the condition fails to improve as anticipated.  I provided 25 minutes of face-to-face video visit time during this encounter, and > 50% was spent counseling as documented under my assessment & plan.     Malachy Mood, MD 04/18/23

## 2023-05-01 ENCOUNTER — Other Ambulatory Visit: Payer: Self-pay

## 2023-05-31 ENCOUNTER — Encounter (HOSPITAL_COMMUNITY): Payer: Self-pay

## 2023-06-07 ENCOUNTER — Ambulatory Visit (HOSPITAL_BASED_OUTPATIENT_CLINIC_OR_DEPARTMENT_OTHER): Admitting: Family Medicine

## 2023-07-16 ENCOUNTER — Ambulatory Visit (HOSPITAL_COMMUNITY)
Admission: RE | Admit: 2023-07-16 | Discharge: 2023-07-16 | Disposition: A | Discharge status: Home / Self Care | Source: Ambulatory Visit | Attending: Hematology | Admitting: Hematology

## 2023-07-16 DIAGNOSIS — C187 Malignant neoplasm of sigmoid colon: Secondary | ICD-10-CM | POA: Insufficient documentation

## 2023-07-16 MED ORDER — IOHEXOL 9 MG/ML PO SOLN
1000.0000 mL | ORAL | Status: AC
  Administered 2023-07-16: 1000 mL via ORAL

## 2023-07-16 MED ORDER — SODIUM CHLORIDE (PF) 0.9 % IJ SOLN
INTRAMUSCULAR | Status: AC
  Filled 2023-07-16: qty 50

## 2023-07-16 MED ORDER — IOHEXOL 300 MG/ML  SOLN
100.0000 mL | Freq: Once | INTRAMUSCULAR | Status: AC | PRN
  Administered 2023-07-16: 100 mL via INTRAVENOUS

## 2023-07-16 MED ORDER — IOHEXOL 9 MG/ML PO SOLN
ORAL | Status: AC
Start: 2023-07-16 — End: 2023-07-16
  Filled 2023-07-16: qty 1000

## 2023-07-25 ENCOUNTER — Telehealth: Payer: Self-pay | Admitting: Hematology

## 2023-07-25 NOTE — Telephone Encounter (Signed)
 Scheduled appointments per staff message from Dr.Feng. Talked with the patient and she is aware of the made appointments.

## 2023-07-30 ENCOUNTER — Other Ambulatory Visit: Payer: Self-pay

## 2023-07-30 DIAGNOSIS — C187 Malignant neoplasm of sigmoid colon: Secondary | ICD-10-CM

## 2023-07-31 ENCOUNTER — Inpatient Hospital Stay: Attending: Hematology

## 2023-07-31 ENCOUNTER — Encounter: Payer: Self-pay | Admitting: Hematology

## 2023-07-31 ENCOUNTER — Inpatient Hospital Stay (HOSPITAL_BASED_OUTPATIENT_CLINIC_OR_DEPARTMENT_OTHER): Admitting: Hematology

## 2023-07-31 VITALS — BP 105/68 | HR 80 | Temp 97.4°F | Resp 17 | Ht 66.0 in | Wt 145.0 lb

## 2023-07-31 DIAGNOSIS — Z800 Family history of malignant neoplasm of digestive organs: Secondary | ICD-10-CM | POA: Insufficient documentation

## 2023-07-31 DIAGNOSIS — C187 Malignant neoplasm of sigmoid colon: Secondary | ICD-10-CM | POA: Diagnosis not present

## 2023-07-31 DIAGNOSIS — Z9049 Acquired absence of other specified parts of digestive tract: Secondary | ICD-10-CM | POA: Insufficient documentation

## 2023-07-31 DIAGNOSIS — Z85038 Personal history of other malignant neoplasm of large intestine: Secondary | ICD-10-CM | POA: Insufficient documentation

## 2023-07-31 DIAGNOSIS — Z803 Family history of malignant neoplasm of breast: Secondary | ICD-10-CM | POA: Diagnosis not present

## 2023-07-31 DIAGNOSIS — D72819 Decreased white blood cell count, unspecified: Secondary | ICD-10-CM | POA: Insufficient documentation

## 2023-07-31 LAB — CBC WITH DIFFERENTIAL (CANCER CENTER ONLY)
Abs Immature Granulocytes: 0.01 K/uL (ref 0.00–0.07)
Basophils Absolute: 0 K/uL (ref 0.0–0.1)
Basophils Relative: 1 %
Eosinophils Absolute: 0.1 K/uL (ref 0.0–0.5)
Eosinophils Relative: 3 %
HCT: 37.2 % (ref 36.0–46.0)
Hemoglobin: 11.5 g/dL — ABNORMAL LOW (ref 12.0–15.0)
Immature Granulocytes: 0 %
Lymphocytes Relative: 22 %
Lymphs Abs: 0.8 K/uL (ref 0.7–4.0)
MCH: 25.1 pg — ABNORMAL LOW (ref 26.0–34.0)
MCHC: 30.9 g/dL (ref 30.0–36.0)
MCV: 81.2 fL (ref 80.0–100.0)
Monocytes Absolute: 0.3 K/uL (ref 0.1–1.0)
Monocytes Relative: 8 %
Neutro Abs: 2.3 K/uL (ref 1.7–7.7)
Neutrophils Relative %: 66 %
Platelet Count: 232 K/uL (ref 150–400)
RBC: 4.58 MIL/uL (ref 3.87–5.11)
RDW: 15.4 % (ref 11.5–15.5)
WBC Count: 3.5 K/uL — ABNORMAL LOW (ref 4.0–10.5)
nRBC: 0 % (ref 0.0–0.2)

## 2023-07-31 LAB — CMP (CANCER CENTER ONLY)
ALT: 7 U/L (ref 0–44)
AST: 16 U/L (ref 15–41)
Albumin: 4.2 g/dL (ref 3.5–5.0)
Alkaline Phosphatase: 72 U/L (ref 38–126)
Anion gap: 7 (ref 5–15)
BUN: 17 mg/dL (ref 6–20)
CO2: 29 mmol/L (ref 22–32)
Calcium: 9.7 mg/dL (ref 8.9–10.3)
Chloride: 106 mmol/L (ref 98–111)
Creatinine: 0.82 mg/dL (ref 0.44–1.00)
GFR, Estimated: >60 mL/min (ref >60)
Glucose, Bld: 94 mg/dL (ref 70–99)
Potassium: 3.8 mmol/L (ref 3.5–5.1)
Sodium: 142 mmol/L (ref 135–145)
Total Bilirubin: 0.4 mg/dL (ref 0.0–1.2)
Total Protein: 8 g/dL (ref 6.5–8.1)

## 2023-07-31 NOTE — Progress Notes (Signed)
 [facility]   [phone] [phone]   Clinic Follow up Note   Patient Care Team: Caudle, Thersia Bitters, FNP as PCP - General (Family Medicine) Gladis Cough, MD as Consulting Physician (General Surgery) Elicia Claw, MD as Consulting Physician (Gastroenterology) Lanny Callander, MD as Consulting Physician (Hematology) [name], MD as Consulting Physician (Obstetrics and Gynecology)  Date of Service:  07/31/2023  CHIEF COMPLAINT: f/u of colon cancer   CURRENT THERAPY:  Cancer surveillance   Oncology History   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 Gladis. 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 Margins and lymph nodes negative. -she received adjuvant CAPOX 03/14/21 - 05/23/21 to reduce her risk of recurrence. she had additional Xeloda  alone to complete 6 months of treatment.  -surveillance colonoscopy on 06/21/21 with Dr. Federico was unremarkable. Plan for repeat in 1 year. -restaging CT AP on 08/11/21 showed: a soft tissue nodule favored to be related to left ovary, but adenopathy not completely excluded. -Due to rising CEA, we repeated CT on 08/17/2022 which showed enlarging rounded lesion along the LEFT iliac vessels, now measuring 4.2cm, concerning for cancer recurrence. -PET from 09/05/22 showed Intensely hypermetabolic mass in the LEFT pelvis is consistent with metastatic lymphadenopathy or local recurrence of colorectal carcinoma. -pt was referred to Dr. Ricka at [facility] and she recommended neoadjuvant RT, I recommend with concurrent Xeloda . She started on 10/10/2022 and finished on 11/15/2022. She tolerated treatment very well and the tumor marker CEA dropped down to normal again.   -She underwent surgery on December 29, 2022. No residual disease  -restaging CT 07/16/2023  showed post-op changes no evidence of recurrence   Assessment & Plan Colon cancer No current signs of recurrence. Recent abdominal and pelvic scan on June 23 showed only scar tissue from previous surgeries. Tumor marker previously elevated but now normalized. She is concerned about frequent scans and potential health risks. - Discussed alternative monitoring strategies including blood tests for circulating tumor cells and tumor markers (CEA) to reduce frequency of scans. - Consider MRI as an alternative to CT scans if insurance approves. - Order tumor marker test (CEA) and Signatera test next week. - Schedule follow-up every three months with lab tests.  Leukopenia Improving white blood cell count, currently at 3.5 (normal range 4-10). Hemoglobin and platelet counts are normal. No current symptoms or complications reported.  Plan -she is clinically doing well, lab and CT scan reviewed, NED - Will obtain CEA and ctDNA testing Signatera next week -lab and f/u in 3 months, will order restaging scan (MRI) on next visit    SUMMARY OF ONCOLOGIC HISTORY: Oncology History Overview Note   Cancer Staging  Cancer of sigmoid colon [facility]) Staging form: Colon and Rectum, AJCC 8th Edition - Pathologic stage from 02/10/2021: Stage IIB (pT4a, pN0, cM0) - Signed by Lanny Callander, MD on 02/28/2021    Cancer of sigmoid colon (HCC)  11/27/2020 Imaging   CLINICAL DATA:  Abdominal pain that is began Monday. Patient reports seen scant amounts of dark blood in stool. Feels bloated.   EXAM: CT ABDOMEN AND PELVIS WITH CONTRAST  IMPRESSION: 1. Proctocolitis involving the mid and distal sigmoid colon and upper rectum with a 3.9 cm gas containing interloop abscess centered in the sigmoid mesocolon. 2. Severe fecal burden.  No findings of bowel obstruction. 3. An enlarged left common iliac lymph node  is likely reactive. 4. A 2.7 cm hemangioma is noted in the right hepatic lobe. 5. Mild focal bronchiolitis in the  right lower lobe.   02/07/2021 Imaging   CLINICAL DATA:  Abdominal pain, cramping, constipation   EXAM: CT ABDOMEN AND PELVIS WITH CONTRAST  IMPRESSION: 1. There is severe, circumferential wall thickening and mucosal hyperenhancement involving the distal sigmoid colon and rectum to the anus, generally tethered appearance unchanged, involving at least 15-20 cm of distal sigmoid and rectum. 2. There is again seen an abscess at the medial aspect of the rectosigmoid junction measuring approximately 3.0 x 1.6 cm. 3. Findings are generally consistent with nonspecific infectious, inflammatory, or ischemic colitis, including inflammatory bowel disease such as Crohn's disease or ulcerative colitis; however an underlying malignancy is very difficult to exclude, particularly given substantial wall thickening. 4. Large burden of stool throughout the extremely redundant proximal colon, likely reflecting some degree of obstipation. 5. Enlarged left external iliac lymph nodes measuring up to 1.1 x 0.9 cm fall, nonspecific, possibly reactive, however nodal metastatic disease is not excluded per above.   02/09/2021 Initial Biopsy   FINAL MICROSCOPIC DIAGNOSIS:   A. COLON MASS, SIGMOID, BIOPSY:  - Adenomatous lesion with extensive high-grade dysplasia, see comment   B. RECTAL, COLON, SIGMOID, BIOPSY:  - Mild acute/ subacute nonspecific colitis, see comment  - Negative for definite features of chronicity, granulomas or dysplasia   COMMENT:  A.  Evidence of invasive carcinoma is not seen in the submitted biopsies but the biopsy fragments appear superficial and a more severe deeper process cannot be ruled out.  B.  Histologic features in the rectosigmoid biopsies are not compatible with untreated inflammatory bowel disease.  Differential diagnosis can include infection, drug-effect and stercoral proctitis among other possibilities.    02/09/2021 Procedure   Colonoscopy, Dr. Elicia  Impression: -  Preparation of the colon was fair. - Likely malignant partially obstructing tumor in the distal sigmoid colon. Biopsied. Tattooed. - Congested, inflamed and thickened folds of the mucosa in the recto-sigmoid colon. Biopsied. Tattooed.   02/10/2021 Cancer Staging   Staging form: Colon and Rectum, AJCC 8th Edition - Pathologic stage from 02/10/2021: Stage IIB (pT4a, pN0, cM0) - Signed by Lanny Callander, MD on 02/28/2021 Stage prefix: Initial diagnosis Total positive nodes: 0 Histologic grading system: 4 grade system Histologic grade (G): G2 Residual tumor (R): R0 - None   02/10/2021 Definitive Surgery   FINAL MICROSCOPIC DIAGNOSIS:   A. COLON, SIGMOID:  - Invasive moderately differentiated adenocarcinoma.  - Twenty-seven lymph nodes, negative for metastatic carcinoma (0/27).  - See oncology table below.   B. COLON, DISTAL, SIGMOID:  - Segment of colon with serosal adhesions, acute inflammation, and tattoo pigment.  - Negative for malignancy.    02/11/2021 Initial Diagnosis   Cancer of sigmoid colon (HCC)   03/14/2021 - 05/23/2021 Chemotherapy   Patient is on Treatment Plan : COLORECTAL Xelox (Capeox) q21d     04/20/2021 Genetic Testing   Negative hereditary cancer genetic testing: no pathogenic variants detected in [employer] CustomNext-Cancer +RNAinsight Panel.  Report date is April 20, 2021.   The CustomNext-Cancer+RNAinsight panel offered by [employer] includes sequencing and rearrangement analysis for the following 47 genes:  APC, ATM, AXIN2, BARD1, BMPR1A, BRCA1, BRCA2, BRIP1, CDH1, CDK4, CDKN2A, CHEK2, DICER1, EPCAM, GREM1, HOXB13, MEN1, MLH1, MSH2, MSH3, MSH6, MUTYH, NBN, NF1, NF2, NTHL1, PALB2, PMS2, POLD1, POLE, PTEN, RAD51C, RAD51D, RECQL, RET, SDHA, SDHAF2, SDHB, SDHC, SDHD, SMAD4, SMARCA4, STK11, TP53, TSC1, TSC2, and VHL.  RNA data  is routinely analyzed for use in variant interpretation for all genes.    02/20/2022 Imaging    IMPRESSION: 1. Similar postsurgical changes of partial  sigmoidectomy with Hartmann's pouch formation and left anterior abdominal wall colostomy. No noncontrast CT evidence of local recurrence. New nonobstructive bowel/fluid containing peristomal hernia. 2. No convincing evidence of metastatic disease within the chest, abdomen, or pelvis on this noncontrast enhanced examination. 3. New multifocal subpleural foci of nodularity correspond with regions of atelectasis seen on prior imaging, favored to reflect subsegmental atelectasis. Stable 3 mm right lower lobe pulmonary nodule, favored benign. Consider attention on dedicated short-term interval follow-up chest CT. 4. Stable soft tissue along the left iliac vessels again favored to reflect left ovary given its continuity with the gonadal vessels but difficult to evaluate given lack of intravenous contrast material. Correlation with CEA levels and continued attention on follow-up imaging suggested. 5. Moderate volume of formed stool throughout the colon. Correlate for constipation.      Discussed the use of AI scribe software for clinical note transcription with the patient, who gave verbal consent to proceed.  History of Present Illness Barbara Valdez is a 48 year old female with colon cancer who presents for follow-up.  She is asymptomatic with no gastrointestinal symptoms, including abdominal or pelvic pain, or hematochezia. Bowel movements are regular. There are no residual effects from previous treatments, such as numbness or tingling.  Recent laboratory results show a white blood cell count of 3.5, hemoglobin level of 11.5, and normal platelet count. Kidney and liver function tests are normal. A scan on June 23rd revealed scar tissue from previous surgeries.  Initially diagnosed with colon cancer in January 2023, she experienced a recurrence in 2025. She underwent two surgeries, including a hysterectomy and removal of both ovaries and the appendix, resulting in the cessation of menstrual  periods.  Her aunt had lymphoma cancer and was monitored through blood tests for tumor activity. She expresses concern about the frequency of body scans and their potential harm, preferring MRIs for monitoring.     All other systems were reviewed with the patient and are negative.  MEDICAL HISTORY:  Past Medical History:  Diagnosis Date   Anemia    Anxiety    Colon cancer (HCC) 01/2021   stage 2   Complication of anesthesia    took a long time to wake up after surgery   Family history of breast cancer 03/30/2021   Family history of colon cancer 03/30/2021    SURGICAL HISTORY: Past Surgical History:  Procedure Laterality Date   ABDOMINAL HYSTERECTOMY Bilateral 09/26/2016   Procedure: HYSTERECTOMY ABDOMINAL W/ BILATERAL SALPINGECTOMY;  Surgeon: [name], MD;  Location: [facility];  Service: Gynecology;  Laterality: Bilateral;   BIOPSY  02/09/2021   Procedure: BIOPSY;  Surgeon: Elicia Claw, MD;  Location: [facility];  Service: Gastroenterology;;   BREAST BIOPSY Left    COLONOSCOPY N/A 02/09/2021   Procedure: COLONOSCOPY;  Surgeon: Elicia Claw, MD;  Location: [facility];  Service: Gastroenterology;  Laterality: N/A;   LAPAROSCOPIC PARTIAL COLECTOMY N/A 02/10/2021   Procedure: ROBOTIC ASSISTED PARTIAL COLECTOMY WITH COLOSTOMY;  Surgeon: Gladis Cough, MD;  Location: [facility];  Service: General;  Laterality: N/A;   SUBMUCOSAL TATTOO INJECTION  02/09/2021   Procedure: SUBMUCOSAL TATTOO INJECTION;  Surgeon: Elicia Claw, MD;  Location: [facility];  Service: Gastroenterology;;   TUBAL LIGATION      I have reviewed the social history and family history with the patient and they are unchanged from  previous note.  ALLERGIES:  is allergic to ferrlecit  [na ferric gluc cplx in sucrose].  MEDICATIONS:  Current Outpatient Medications  Medication Sig Dispense Refill   acetaminophen  (TYLENOL ) 500 MG tablet Take 2 tablets (1,000 mg total) by mouth every 6 (six) hours as  needed for mild pain. 30 tablet 0   capecitabine  (XELODA ) 500 MG tablet Take 3 tablets (1,500 mg total) by mouth 2 (two) times daily after a meal. Take the days of radiation, Monday through Friday, off on weekends 90 tablet 1   estradiol  (CLIMARA  - DOSED IN MG/24 HR) 0.1 mg/24hr patch Place 1 patch (0.1 mg total) onto the skin once a week. 4 patch 12   metroNIDAZOLE  (FLAGYL ) 500 MG tablet Take 1 tablet (500 mg total) by mouth 2 (two) times daily. (Patient not taking: Reported on 09/26/2022) 14 tablet 0   ondansetron  (ZOFRAN ) 8 MG tablet Take 1 tablet (8 mg total) by mouth every 8 (eight) hours as needed for refractory nausea / vomiting. Start on day 3 after chemotherapy. 30 tablet 1   pantoprazole  (PROTONIX ) 40 MG tablet Take 40 mg by mouth daily as needed. (Patient not taking: Reported on 09/26/2022)     polyethylene glycol (MIRALAX  / GLYCOLAX ) 17 g packet Take 17 g by mouth daily. (Patient not taking: Reported on 09/26/2022) 14 each 0   No current facility-administered medications for this visit.    PHYSICAL EXAMINATION: ECOG PERFORMANCE STATUS: 0 - Asymptomatic  Vitals:   07/31/23 1535  BP: 105/68  Pulse: 80  Resp: 17  Temp: (!) 97.4 F (36.3 C)  SpO2: 100%   Wt Readings from Last 3 Encounters:  07/31/23 145 lb (65.8 kg)  03/23/23 133 lb 11.2 oz (60.6 kg)  02/22/23 135 lb 14.4 oz (61.6 kg)     GENERAL:alert, no distress and comfortable SKIN: skin color, texture, turgor are normal, no rashes or significant lesions EYES: normal, Conjunctiva are pink and non-injected, sclera clear  Musculoskeletal:no cyanosis of digits and no clubbing  NEURO: alert & oriented x 3 with fluent speech, no focal motor/sensory deficits  Physical Exam    LABORATORY DATA:  I have reviewed the data as listed    Latest Ref Rng & Units 07/31/2023    3:20 PM 03/23/2023    1:14 PM 02/22/2023    1:52 PM  CBC  WBC 4.0 - 10.5 K/uL 3.5  2.3  2.3   Hemoglobin 12.0 - 15.0 g/dL 88.4  88.9  88.7   Hematocrit 36.0  - 46.0 % 37.2  34.5  36.9   Platelets 150 - 400 K/uL 232  330  297         Latest Ref Rng & Units 07/31/2023    3:20 PM 03/23/2023    1:14 PM 02/22/2023    1:52 PM  CMP  Glucose 70 - 99 mg/dL 94  94  85   BUN 6 - 20 mg/dL 17  12  12    Creatinine 0.44 - 1.00 mg/dL 9.17  9.26  9.26   Sodium 135 - 145 mmol/L 142  142  141   Potassium 3.5 - 5.1 mmol/L 3.8  4.4  4.0   Chloride 98 - 111 mmol/L 106  104  107   CO2 22 - 32 mmol/L 29  24  28    Calcium 8.9 - 10.3 mg/dL 9.7  9.8  9.7   Total Protein 6.5 - 8.1 g/dL 8.0  7.6  8.0   Total Bilirubin 0.0 - 1.2 mg/dL 0.4  0.3  0.5   Alkaline Phos 38 - 126 U/L 72  68  53   AST 15 - 41 U/L 16  15  15    ALT 0 - 44 U/L 7  8  5        RADIOGRAPHIC STUDIES: I have personally reviewed the radiological images as listed and agreed with the findings in the report. No results found.    Orders Placed This Encounter  Procedures   CEA (Access)-[facility] ONLY    Standing Status:   Standing    Number of Occurrences:   50    Expiration Date:   07/30/2024   All questions were answered. The patient knows to call the clinic with any problems, questions or concerns. No barriers to learning was detected. The total time spent in the appointment was 25 minutes, including review of chart and various tests results, discussions about plan of care and coordination of care plan     Onita Mattock, MD 07/31/2023

## 2023-07-31 NOTE — Assessment & Plan Note (Signed)
 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 Gladis. 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 Margins and lymph nodes negative. -she received adjuvant CAPOX 03/14/21 - 05/23/21 to reduce her risk of recurrence. she had additional Xeloda  alone to complete 6 months of treatment.  -surveillance colonoscopy on 06/21/21 with Dr. Federico was unremarkable. Plan for repeat in 1 year. -restaging CT AP on 08/11/21 showed: a soft tissue nodule favored to be related to left ovary, but adenopathy not completely excluded. -Due to rising CEA, we repeated CT on 08/17/2022 which showed enlarging rounded lesion along the LEFT iliac vessels, now measuring 4.2cm, concerning for cancer recurrence. -PET from 09/05/22 showed Intensely hypermetabolic mass in the LEFT pelvis is consistent with metastatic lymphadenopathy or local recurrence of colorectal carcinoma. -pt was referred to Dr. Ricka at [facility] and she recommended neoadjuvant RT, I recommend with concurrent Xeloda . She started on 10/10/2022 and finished on 11/15/2022. She tolerated treatment very well and the tumor marker CEA dropped down to normal again.   -She underwent surgery on December 29, 2022. No residual disease  -restaging CT 07/16/2023 showed post-op changes no evidence of recurrence

## 2023-08-02 ENCOUNTER — Other Ambulatory Visit: Payer: Self-pay

## 2023-08-02 DIAGNOSIS — C187 Malignant neoplasm of sigmoid colon: Secondary | ICD-10-CM

## 2023-08-02 NOTE — Progress Notes (Signed)
 As per Dr. Lanny, placed order in portal for Signatera to be done, uploaded all paperwork with the order, received confirmation of receipt. Labs will be drawn at [facility] on July 14. Placed note in lab app to insure that it is drawn.

## 2023-08-06 ENCOUNTER — Ambulatory Visit (HOSPITAL_BASED_OUTPATIENT_CLINIC_OR_DEPARTMENT_OTHER): Admitting: Family Medicine

## 2023-08-06 ENCOUNTER — Encounter (HOSPITAL_BASED_OUTPATIENT_CLINIC_OR_DEPARTMENT_OTHER): Payer: Self-pay | Admitting: Family Medicine

## 2023-08-06 ENCOUNTER — Inpatient Hospital Stay

## 2023-08-06 ENCOUNTER — Other Ambulatory Visit (HOSPITAL_COMMUNITY)
Admission: RE | Admit: 2023-08-06 | Discharge: 2023-08-06 | Disposition: A | Discharge status: Home / Self Care | Source: Ambulatory Visit | Attending: Family Medicine | Admitting: Family Medicine

## 2023-08-06 VITALS — BP 104/77 | HR 64 | Ht 66.0 in | Wt 146.4 lb

## 2023-08-06 DIAGNOSIS — E119 Type 2 diabetes mellitus without complications: Secondary | ICD-10-CM | POA: Diagnosis not present

## 2023-08-06 DIAGNOSIS — C187 Malignant neoplasm of sigmoid colon: Secondary | ICD-10-CM

## 2023-08-06 DIAGNOSIS — N898 Other specified noninflammatory disorders of vagina: Secondary | ICD-10-CM | POA: Diagnosis not present

## 2023-08-06 DIAGNOSIS — Z85038 Personal history of other malignant neoplasm of large intestine: Secondary | ICD-10-CM | POA: Diagnosis not present

## 2023-08-06 DIAGNOSIS — N951 Menopausal and female climacteric states: Secondary | ICD-10-CM

## 2023-08-06 DIAGNOSIS — Z8349 Family history of other endocrine, nutritional and metabolic diseases: Secondary | ICD-10-CM

## 2023-08-06 LAB — CBC WITH DIFFERENTIAL (CANCER CENTER ONLY)
Abs Immature Granulocytes: 0.01 K/uL (ref 0.00–0.07)
Basophils Absolute: 0 K/uL (ref 0.0–0.1)
Basophils Relative: 1 %
Eosinophils Absolute: 0.1 K/uL (ref 0.0–0.5)
Eosinophils Relative: 3 %
HCT: 35.3 % — ABNORMAL LOW (ref 36.0–46.0)
Hemoglobin: 10.8 g/dL — ABNORMAL LOW (ref 12.0–15.0)
Immature Granulocytes: 0 %
Lymphocytes Relative: 31 %
Lymphs Abs: 0.7 K/uL (ref 0.7–4.0)
MCH: 25.1 pg — ABNORMAL LOW (ref 26.0–34.0)
MCHC: 30.6 g/dL (ref 30.0–36.0)
MCV: 82.1 fL (ref 80.0–100.0)
Monocytes Absolute: 0.2 K/uL (ref 0.1–1.0)
Monocytes Relative: 10 %
Neutro Abs: 1.3 K/uL — ABNORMAL LOW (ref 1.7–7.7)
Neutrophils Relative %: 55 %
Platelet Count: 220 K/uL (ref 150–400)
RBC: 4.3 MIL/uL (ref 3.87–5.11)
RDW: 15.2 % (ref 11.5–15.5)
WBC Count: 2.3 K/uL — ABNORMAL LOW (ref 4.0–10.5)
nRBC: 0 % (ref 0.0–0.2)

## 2023-08-06 LAB — CMP (CANCER CENTER ONLY)
ALT: 7 U/L (ref 0–44)
AST: 19 U/L (ref 15–41)
Albumin: 4.2 g/dL (ref 3.5–5.0)
Alkaline Phosphatase: 74 U/L (ref 38–126)
Anion gap: 12 (ref 5–15)
BUN: 11 mg/dL (ref 6–20)
CO2: 23 mmol/L (ref 22–32)
Calcium: 9.4 mg/dL (ref 8.9–10.3)
Chloride: 106 mmol/L (ref 98–111)
Creatinine: 0.75 mg/dL (ref 0.44–1.00)
GFR, Estimated: >60 mL/min (ref >60)
Glucose, Bld: 84 mg/dL (ref 70–99)
Potassium: 3.6 mmol/L (ref 3.5–5.1)
Sodium: 141 mmol/L (ref 135–145)
Total Bilirubin: 0.4 mg/dL (ref 0.0–1.2)
Total Protein: 7.7 g/dL (ref 6.5–8.1)

## 2023-08-06 LAB — POCT UA - MICROALBUMIN
Albumin/Creatinine Ratio, Urine, POC: <30
Creatinine, POC: 100 mg/dL
Microalbumin Ur, POC: 10 mg/L

## 2023-08-06 LAB — CEA (ACCESS): CEA (CHCC): <1 ng/mL (ref 0.00–5.00)

## 2023-08-06 NOTE — Progress Notes (Signed)
 Subjective:   Barbara Valdez 1975/02/08 08/06/2023  Chief Complaint  Patient presents with   Medical Management of Chronic Issues    Pt is here for a follow up; had surgery performed December 2024. Wants to have a follow up for diabetes and to also talk about menopause.    HPI: Barbara Valdez presents today for re-assessment and management of chronic medical conditions.  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  She is still seeing Dr. Ileana with oncology recorder with scans.  Her most recent visit on 07/31/2023 indicated no evidence of recurrence with CT scan.  DIABETES MELLITUS: [name] presents for the medical management of diabetes.  Current diabetes medication regimen: Diet and exercise Patient is  adhering to a diabetic diet.  Patient is  exercising regularly.  Patient is  checking BS regularly.  Patient is  checking their feet regularly.  Denies polydipsia, polyphagia, polyuria, open wounds or ulcers on feet.  Lab Results  Component Value Date   HGBA1C 6.5 (H) 08/09/2022    Foot Exam: 08/06/2023 Lab Results  Component Value Date   MICROALBUR 10 08/06/2023    Wt Readings from Last 3 Encounters:  08/06/23 146 lb 6.4 oz (66.4 kg)  07/31/23 145 lb (65.8 kg)  03/23/23 133 lb 11.2 oz (60.6 kg)    POST Hysterectomy Menopausal Symptoms:  Patient had complete oophorectomy in December with colon surgery and since that time she has stopped taking estradiol  patches for hormone replacement therapy.  She is desiring alternative therapies for intermittent hot flashes.  She denies these interrupting her sleep cycle and states that these are very infrequent, but  do cause her to sweat when they occur.  She denies any other vasomotor symptoms at this time.  The following portions of the patient's history were reviewed and updated as appropriate: past medical history, past surgical history, family history, social history, allergies, medications, and problem list.   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   Iron  deficiency anemia 07/18/2016   Past Medical History:  Diagnosis Date   Anemia    Anxiety    Colon cancer (HCC) 01/2021   stage 2   Complication of anesthesia    took a long time to wake up after surgery   Family history of breast cancer 03/30/2021   Family history of colon cancer 03/30/2021   Menorrhagia with regular cycle 07/18/2016   Noninfective gastroenteritis and colitis, unspecified 11/27/2020   Post-operative state 09/26/2016   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   Past Surgical History:  Procedure Laterality Date   ABDOMINAL HYSTERECTOMY Bilateral 09/26/2016   Procedure: HYSTERECTOMY ABDOMINAL W/ BILATERAL SALPINGECTOMY;  Surgeon: [name], MD;  Location: [facility];  Service: Gynecology;  Laterality: Bilateral;   BIOPSY  02/09/2021   Procedure: BIOPSY;  Surgeon: Elicia Claw, MD;  Location:  [facility];  Service: Gastroenterology;;   BREAST BIOPSY Left    COLONOSCOPY N/A 02/09/2021   Procedure: COLONOSCOPY;  Surgeon: Elicia Claw, MD;  Location: [facility];  Service: Gastroenterology;  Laterality: N/A;   LAPAROSCOPIC PARTIAL COLECTOMY N/A 02/10/2021   Procedure: ROBOTIC ASSISTED PARTIAL COLECTOMY WITH COLOSTOMY;   Surgeon: Gladis Cough, MD;  Location: [facility];  Service: General;  Laterality: N/A;   SUBMUCOSAL TATTOO INJECTION  02/09/2021   Procedure: SUBMUCOSAL TATTOO INJECTION;  Surgeon: Elicia Claw, MD;  Location: [facility];  Service: Gastroenterology;;   TUBAL LIGATION     Family History  Problem Relation Age of Onset   Hyperlipidemia Father    Breast cancer Maternal Aunt    Lymphoma Maternal Aunt    Colon cancer Maternal Uncle 62   Lung cancer Maternal Uncle    Lymphoma Paternal Grandmother        d. 63   Breast cancer Cousin 36       maternal female cousin   Hypertension Other    Cancer Other        PGF's mother; d. early 11s; unknown type; mets   Outpatient Medications Prior to Visit  Medication Sig Dispense Refill   acetaminophen  (TYLENOL ) 500 MG tablet Take 2 tablets (1,000 mg total) by mouth every 6 (six) hours as needed for mild pain. 30 tablet 0   capecitabine  (XELODA ) 500 MG tablet Take 3 tablets (1,500 mg total) by mouth 2 (two) times daily after a meal. Take the days of radiation, Monday through Friday, off on weekends 90 tablet 1   estradiol  (CLIMARA  - DOSED IN MG/24 HR) 0.1 mg/24hr patch Place 1 patch (0.1 mg total) onto the skin once a week. 4 patch 12   metroNIDAZOLE  (FLAGYL ) 500 MG tablet Take 1 tablet (500 mg total) by mouth 2 (two) times daily. (Patient not taking: Reported on 09/26/2022) 14 tablet 0   ondansetron  (ZOFRAN ) 8 MG tablet Take 1 tablet (8 mg total) by mouth every 8 (eight) hours as needed for refractory nausea / vomiting. Start on day 3 after chemotherapy. 30 tablet 1   pantoprazole  (PROTONIX ) 40 MG tablet Take 40 mg by mouth daily as needed. (Patient not taking: Reported on 09/26/2022)     polyethylene glycol (MIRALAX  / GLYCOLAX ) 17 g packet Take 17 g by mouth daily. (Patient not taking: Reported on 09/26/2022) 14 each 0   No facility-administered medications prior to visit.   Allergies  Allergen Reactions   Ferrlecit  [Na Ferric Gluc Cplx In Sucrose]  Nausea And Vomiting     ROS: A complete ROS was performed with pertinent positives/negatives noted in the HPI. The remainder of the ROS are negative.    Objective:   Today's Vitals   08/06/23 1258  BP: 104/77  Pulse: 64  SpO2: 100%  Weight: 146 lb 6.4 oz (66.4 kg)  Height: 5' 6 (1.676 m)    Physical Exam          GENERAL: Well-appearing, in NAD. Well nourished.  SKIN: Pink, warm and dry.  Head: Normocephalic. NECK: Trachea midline. Full ROM w/o pain or tenderness.  RESPIRATORY: Chest wall symmetrical. Respirations even and non-labored. Breath sounds clear to auscultation bilaterally.  CARDIAC: S1, S2 present, regular rate and rhythm without murmur or gallops. Peripheral pulses 2+ bilaterally.  MSK: Muscle tone and strength appropriate for age.  NEUROLOGIC: No motor or sensory deficits. Steady, even gait. C2-C12 intact.  PSYCH/MENTAL STATUS: Alert, oriented x 3. Cooperative, appropriate mood and affect.   Results for orders  placed or performed in visit on 08/06/23  POCT UA - Microalbumin   Collection Time: 08/06/23  1:43 PM  Result Value Ref Range   Microalbumin Ur, POC 10 mg/L   Creatinine, POC 100 mg/dL   Albumin /Creatinine Ratio, Urine, POC <30      Health Maintenance Due  Topic Date Due   OPHTHALMOLOGY EXAM  Never done   Pneumococcal Vaccine 55-35 Years old (1 of 2 - PCV) Never done   Hepatitis B Vaccines (1 of 3 - 19+ 3-dose series) Never done   COVID-19 Vaccine (2 - Pfizer risk series) 05/22/2019   HEMOGLOBIN A1C  02/09/2023    Results for orders placed or performed in visit on 08/06/23  POCT UA - Microalbumin  Result Value Ref Range   Microalbumin Ur, POC 10 mg/L   Creatinine, POC 100 mg/dL   Albumin /Creatinine Ratio, Urine, POC <30     The 10-year ASCVD risk score (Arnett DK, et al., 2019) is: 1.1%     Assessment & Plan:  1. Type 2 diabetes mellitus without complication, without long-term current use of insulin (HCC) (Primary) Previously  controlled with diet and exercise.  Will check A1c with lab work today.  Urine albumin  and foot exam completed in office today. - Hemoglobin A1c - POCT UA - Microalbumin  2. Vaginal odor Patient reports recurring symptoms of possible Veet BV and yeast.  Will check with self swab today.  She denies any discharge or bleeding. - Cervicovaginal ancillary only  3. Menopausal state We discussed nonhormonal and homeopathic options with patient and recommendations provided in AVS.  Will check vitamin D with lab work today given menopausal state after total abdominal hysterectomy. - VITAMIN D 25 Hydroxy (Vit-D Deficiency, Fractures)  4. Family history of thyroid disease Patient currently asymptomatic.  Will check TSH given family history for patient request. - TSH  No orders of the defined types were placed in this encounter.  Lab Orders         VITAMIN D 25 Hydroxy (Vit-D Deficiency, Fractures)         Hemoglobin A1c         TSH         POCT UA - Microalbumin      Return in about 6 months (around 02/06/2024) for ANNUAL PHYSICAL, DM2.    Patient to reach out to office if new, worrisome, or unresolved symptoms arise or if no improvement in patient's condition. Patient verbalized understanding and is agreeable to treatment plan. All questions answered to patient's satisfaction.    Thersia Schuyler Stark, [place]

## 2023-08-06 NOTE — Patient Instructions (Addendum)
 [place] [facility]   Ophthalmology Offices   [facility]  [address]  [place], [place] [address] [phone]  [facility] [address] [place], [place] [address] Phone: [phone]  [facility] [address] [place], [place] [address] Phone: [phone]  [facility]  Optometrist [address]  [phone]  [facility] [address]  [phone]

## 2023-08-07 ENCOUNTER — Ambulatory Visit (HOSPITAL_BASED_OUTPATIENT_CLINIC_OR_DEPARTMENT_OTHER): Payer: Self-pay | Admitting: Family Medicine

## 2023-08-07 LAB — CERVICOVAGINAL ANCILLARY ONLY
Bacterial Vaginitis (gardnerella): NEGATIVE
Candida Glabrata: NEGATIVE
Candida Vaginitis: NEGATIVE
Comment: NEGATIVE
Comment: NEGATIVE
Comment: NEGATIVE

## 2023-08-07 NOTE — Progress Notes (Signed)
 Hi Barbara Valdez, Your vaginal swab was negative for BV or yeast.

## 2023-08-27 LAB — SIGNATERA
SIGNATERA MTM READOUT: 0 MTM/ml
SIGNATERA TEST RESULT: NEGATIVE

## 2023-08-28 ENCOUNTER — Encounter (HOSPITAL_COMMUNITY): Payer: Self-pay | Admitting: Hematology

## 2023-10-23 ENCOUNTER — Inpatient Hospital Stay: Attending: Hematology

## 2023-10-23 ENCOUNTER — Inpatient Hospital Stay: Admitting: Hematology

## 2023-10-23 NOTE — Assessment & Plan Note (Deleted)
 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 Gladis. 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 Margins and lymph nodes negative. -she received adjuvant CAPOX 03/14/21 - 05/23/21 to reduce her risk of recurrence. she had additional Xeloda  alone to complete 6 months of treatment.  -surveillance colonoscopy on 06/21/21 with Dr. Federico was unremarkable. Plan for repeat in 1 year. -restaging CT AP on 08/11/21 showed: a soft tissue nodule favored to be related to left ovary, but adenopathy not completely excluded. -Due to rising CEA, we repeated CT on 08/17/2022 which showed enlarging rounded lesion along the LEFT iliac vessels, now measuring 4.2cm, concerning for cancer recurrence. -PET from 09/05/22 showed Intensely hypermetabolic mass in the LEFT pelvis is consistent with metastatic lymphadenopathy or local recurrence of colorectal carcinoma. -pt was referred to Dr. Ricka at [facility] and she recommended neoadjuvant RT, I recommend with concurrent Xeloda . She started on 10/10/2022 and finished on 11/15/2022. She tolerated treatment very well and the tumor marker CEA dropped down to normal again.   -She underwent surgery on December 29, 2022. No residual disease  -restaging CT 07/16/2023 showed post-op changes no evidence of recurrence  -MRD Signatera was negative in 07/2023, will continue monitoring every 3 months

## 2023-12-24 ENCOUNTER — Encounter (HOSPITAL_BASED_OUTPATIENT_CLINIC_OR_DEPARTMENT_OTHER): Payer: Self-pay | Admitting: Family Medicine

## 2024-01-02 ENCOUNTER — Other Ambulatory Visit: Payer: Self-pay

## 2024-01-03 ENCOUNTER — Inpatient Hospital Stay: Admitting: Hematology

## 2024-01-03 ENCOUNTER — Inpatient Hospital Stay

## 2024-01-03 NOTE — Assessment & Plan Note (Deleted)
 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 Gladis. 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 Margins and lymph nodes negative. -she received adjuvant CAPOX 03/14/21 - 05/23/21 to reduce her risk of recurrence. she had additional Xeloda  alone to complete 6 months of treatment.  -surveillance colonoscopy on 06/21/21 with Dr. Federico was unremarkable. Plan for repeat in 1 year. -restaging CT AP on 08/11/21 showed: a soft tissue nodule favored to be related to left ovary, but adenopathy not completely excluded. -Due to rising CEA, we repeated CT on 08/17/2022 which showed enlarging rounded lesion along the LEFT iliac vessels, now measuring 4.2cm, concerning for cancer recurrence. -PET from 09/05/22 showed Intensely hypermetabolic mass in the LEFT pelvis is consistent with metastatic lymphadenopathy or local recurrence of colorectal carcinoma. -pt was referred to Dr. Ricka at [facility] and she recommended neoadjuvant RT, I recommend with concurrent Xeloda . She started on 10/10/2022 and finished on 11/15/2022. She tolerated treatment very well and the tumor marker CEA dropped down to normal again.   -She underwent surgery on December 29, 2022. No residual disease  -restaging CT 07/16/2023 showed post-op changes no evidence of recurrence

## 2024-01-06 NOTE — Assessment & Plan Note (Signed)
 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 Gladis. 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 Margins and lymph nodes negative. -she received adjuvant CAPOX 03/14/21 - 05/23/21 to reduce her risk of recurrence. she had additional Xeloda  alone to complete 6 months of treatment.  -surveillance colonoscopy on 06/21/21 with Dr. Federico was unremarkable. Plan for repeat in 1 year. -restaging CT AP on 08/11/21 showed: a soft tissue nodule favored to be related to left ovary, but adenopathy not completely excluded. -Due to rising CEA, we repeated CT on 08/17/2022 which showed enlarging rounded lesion along the LEFT iliac vessels, now measuring 4.2cm, concerning for cancer recurrence. -PET from 09/05/22 showed Intensely hypermetabolic mass in the LEFT pelvis is consistent with metastatic lymphadenopathy or local recurrence of colorectal carcinoma. -pt was referred to Dr. Ricka at [facility] and she recommended neoadjuvant RT, I recommend with concurrent Xeloda . She started on 10/10/2022 and finished on 11/15/2022. She tolerated treatment very well and the tumor marker CEA dropped down to normal again.   -She underwent surgery on December 29, 2022. No residual disease  -restaging CT 07/16/2023 showed post-op changes no evidence of recurrence

## 2024-01-07 ENCOUNTER — Inpatient Hospital Stay (HOSPITAL_BASED_OUTPATIENT_CLINIC_OR_DEPARTMENT_OTHER): Admitting: Hematology

## 2024-01-07 ENCOUNTER — Inpatient Hospital Stay: Attending: Hematology

## 2024-01-07 VITALS — BP 107/76 | HR 90 | Temp 97.9°F | Resp 17 | Wt 154.9 lb

## 2024-01-07 DIAGNOSIS — Z800 Family history of malignant neoplasm of digestive organs: Secondary | ICD-10-CM | POA: Diagnosis not present

## 2024-01-07 DIAGNOSIS — Z85038 Personal history of other malignant neoplasm of large intestine: Secondary | ICD-10-CM | POA: Diagnosis present

## 2024-01-07 DIAGNOSIS — T451X5A Adverse effect of antineoplastic and immunosuppressive drugs, initial encounter: Secondary | ICD-10-CM | POA: Diagnosis not present

## 2024-01-07 DIAGNOSIS — D6481 Anemia due to antineoplastic chemotherapy: Secondary | ICD-10-CM | POA: Insufficient documentation

## 2024-01-07 DIAGNOSIS — Z803 Family history of malignant neoplasm of breast: Secondary | ICD-10-CM | POA: Insufficient documentation

## 2024-01-07 DIAGNOSIS — C187 Malignant neoplasm of sigmoid colon: Secondary | ICD-10-CM

## 2024-01-07 DIAGNOSIS — Z9049 Acquired absence of other specified parts of digestive tract: Secondary | ICD-10-CM | POA: Insufficient documentation

## 2024-01-07 LAB — CBC WITH DIFFERENTIAL (CANCER CENTER ONLY)
Abs Immature Granulocytes: 0.01 K/uL (ref 0.00–0.07)
Basophils Absolute: 0 K/uL (ref 0.0–0.1)
Basophils Relative: 1 %
Eosinophils Absolute: 0.2 K/uL (ref 0.0–0.5)
Eosinophils Relative: 5 %
HCT: 37.6 % (ref 36.0–46.0)
Hemoglobin: 11.8 g/dL — ABNORMAL LOW (ref 12.0–15.0)
Immature Granulocytes: 0 %
Lymphocytes Relative: 21 %
Lymphs Abs: 0.8 K/uL (ref 0.7–4.0)
MCH: 25.7 pg — ABNORMAL LOW (ref 26.0–34.0)
MCHC: 31.4 g/dL (ref 30.0–36.0)
MCV: 81.9 fL (ref 80.0–100.0)
Monocytes Absolute: 0.4 K/uL (ref 0.1–1.0)
Monocytes Relative: 11 %
Neutro Abs: 2.2 K/uL (ref 1.7–7.7)
Neutrophils Relative %: 62 %
Platelet Count: 227 K/uL (ref 150–400)
RBC: 4.59 MIL/uL (ref 3.87–5.11)
RDW: 14.8 % (ref 11.5–15.5)
WBC Count: 3.5 K/uL — ABNORMAL LOW (ref 4.0–10.5)
nRBC: 0 % (ref 0.0–0.2)

## 2024-01-07 LAB — CMP (CANCER CENTER ONLY)
ALT: 12 U/L (ref 0–44)
AST: 28 U/L (ref 15–41)
Albumin: 4.5 g/dL (ref 3.5–5.0)
Alkaline Phosphatase: 81 U/L (ref 38–126)
Anion gap: 10 (ref 5–15)
BUN: 14 mg/dL (ref 6–20)
CO2: 26 mmol/L (ref 22–32)
Calcium: 9.8 mg/dL (ref 8.9–10.3)
Chloride: 106 mmol/L (ref 98–111)
Creatinine: 0.86 mg/dL (ref 0.44–1.00)
GFR, Estimated: >60 mL/min (ref >60)
Glucose, Bld: 96 mg/dL (ref 70–99)
Potassium: 4 mmol/L (ref 3.5–5.1)
Sodium: 142 mmol/L (ref 135–145)
Total Bilirubin: 0.6 mg/dL (ref 0.0–1.2)
Total Protein: 8.2 g/dL — ABNORMAL HIGH (ref 6.5–8.1)

## 2024-01-07 LAB — GENETIC SCREENING ORDER

## 2024-01-07 LAB — CEA (ACCESS): CEA (CHCC): <1 ng/mL (ref 0.00–5.00)

## 2024-01-07 NOTE — Progress Notes (Signed)
 [facility]   Telephone:([phone]) [phone] Fax:([phone]) [phone]   Clinic Follow up Note   Patient Care Team: Caudle, Thersia Bitters, FNP as PCP - General (Family Medicine) Gladis Cough, MD as Consulting Physician (General Surgery) Elicia Claw, MD as Consulting Physician (Gastroenterology) Lanny Callander, MD as Consulting Physician (Hematology) [name], MD as Consulting Physician (Obstetrics and Gynecology)  Date of Service:  01/07/2024  CHIEF COMPLAINT: f/u of sigmoid colon cancer  CURRENT THERAPY:  Cancer surveillance  Oncology History   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 Gladis. 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 Margins and lymph nodes negative. -she received adjuvant CAPOX 03/14/21 - 05/23/21 to reduce her risk of recurrence. she had additional Xeloda  alone to complete 6 months of treatment.  -surveillance colonoscopy on 06/21/21 with Dr. Federico was unremarkable. Plan for repeat in 1 year. -restaging CT AP on 08/11/21 showed: a soft tissue nodule favored to be related to left ovary, but adenopathy not completely excluded. -Due to rising CEA, we repeated CT on 08/17/2022 which showed enlarging rounded lesion along the LEFT iliac vessels, now measuring 4.2cm, concerning for cancer recurrence. -PET from 09/05/22 showed Intensely hypermetabolic mass in the LEFT pelvis is consistent with metastatic lymphadenopathy or local recurrence of colorectal carcinoma. -pt was referred to Dr. Ricka at [facility] and she recommended neoadjuvant RT, I recommend with concurrent Xeloda . She started on 10/10/2022 and finished on 11/15/2022. She tolerated treatment very well and the tumor marker CEA dropped down to normal again.   -She underwent surgery on December 29, 2022. No residual disease  -restaging CT  07/16/2023 showed post-op changes no evidence of recurrence    Assessment & Plan Metastatic sigmoid colon cancer Approximately one year post-second resection for recurrence, she remains asymptomatic with no gastrointestinal symptoms, pain, or bleeding. Surveillance includes imaging and blood-based circulating tumor DNA (ctDNA) testing. She has undergone nine CT scans since diagnosis. She prefers reduced scan frequency and will transition to annual CT imaging if stability persists. - Reviewed recent laboratory results: hemoglobin and leukocyte counts slightly decreased but improved from prior. - Ordered Signatera ctDNA assay for detection of circulating tumor cells. - Ordered surveillance chest CT, scheduled for January per her preference to avoid the holiday season. - Plan to transition to annual CT imaging after this scan if results remain stable. - Provided anticipatory guidance: will directly notify her only if abnormal results are identified; otherwise, results will be communicated via secure message. - Scheduled follow-up in three months for ongoing surveillance.  Anemia secondary to chemotherapy Mild anemia with hemoglobin improved compared to prior, but remains slightly below normal, likely secondary to prior chemotherapy. She is asymptomatic with no evidence of bleeding. - Monitored hemoglobin and complete blood count; noted improvement. - Will continue routine surveillance laboratory monitoring.  Plan - She is clinically doing well, exam was unremarkable. - Lab reviewed, Signatera still pending - She is due for surveillance CT, I ordered today and we will get it scheduled for the next few weeks. -lab and f/u in 3 months    SUMMARY OF ONCOLOGIC HISTORY: Oncology History Overview Note   Cancer Staging  Cancer of sigmoid colon [facility]) Staging form: Colon and Rectum, AJCC 8th Edition - Pathologic stage from 02/10/2021: Stage IIB (pT4a, pN0, cM0) - Signed by Lanny Callander, MD on  02/28/2021    Cancer of sigmoid colon (HCC)  11/27/2020 Imaging   CLINICAL DATA:  Abdominal pain that is began Monday. Patient reports seen scant amounts of dark blood in stool. Feels bloated.   EXAM: CT ABDOMEN AND PELVIS WITH CONTRAST  IMPRESSION: 1. Proctocolitis involving the mid and distal sigmoid colon and upper rectum with a 3.9 cm gas containing interloop abscess centered in the sigmoid mesocolon. 2. Severe fecal burden.  No findings of bowel obstruction. 3. An enlarged left common iliac lymph node is likely reactive. 4. A 2.7 cm hemangioma is noted in the right hepatic lobe. 5. Mild focal bronchiolitis in the right lower lobe.   02/07/2021 Imaging   CLINICAL DATA:  Abdominal pain, cramping, constipation   EXAM: CT ABDOMEN AND PELVIS WITH CONTRAST  IMPRESSION: 1. There is severe, circumferential wall thickening and mucosal hyperenhancement involving the distal sigmoid colon and rectum to the anus, generally tethered appearance unchanged, involving at least 15-20 cm of distal sigmoid and rectum. 2. There is again seen an abscess at the medial aspect of the rectosigmoid junction measuring approximately 3.0 x 1.6 cm. 3. Findings are generally consistent with nonspecific infectious, inflammatory, or ischemic colitis, including inflammatory bowel disease such as Crohn's disease or ulcerative colitis; however an underlying malignancy is very difficult to exclude, particularly given substantial wall thickening. 4. Large burden of stool throughout the extremely redundant proximal colon, likely reflecting some degree of obstipation. 5. Enlarged left external iliac lymph nodes measuring up to 1.1 x 0.9 cm fall, nonspecific, possibly reactive, however nodal metastatic disease is not excluded per above.   02/09/2021 Initial Biopsy   FINAL MICROSCOPIC DIAGNOSIS:   A. COLON MASS, SIGMOID, BIOPSY:  - Adenomatous lesion with extensive high-grade dysplasia, see comment   B. RECTAL,  COLON, SIGMOID, BIOPSY:  - Mild acute/ subacute nonspecific colitis, see comment  - Negative for definite features of chronicity, granulomas or dysplasia   COMMENT:  A.  Evidence of invasive carcinoma is not seen in the submitted biopsies but the biopsy fragments appear superficial and a more severe deeper process cannot be ruled out.  B.  Histologic features in the rectosigmoid biopsies are not compatible with untreated inflammatory bowel disease.  Differential diagnosis can include infection, drug-effect and stercoral proctitis among other possibilities.    02/09/2021 Procedure   Colonoscopy, Dr. Elicia  Impression: - Preparation of the colon was fair. - Likely malignant partially obstructing tumor in the distal sigmoid colon. Biopsied. Tattooed. - Congested, inflamed and thickened folds of the mucosa in the recto-sigmoid colon. Biopsied. Tattooed.   02/10/2021 Cancer Staging   Staging form: Colon and Rectum, AJCC 8th Edition - Pathologic stage from 02/10/2021: Stage IIB (pT4a, pN0, cM0) - Signed by Lanny Callander, MD on 02/28/2021 Stage prefix: Initial diagnosis Total positive nodes: 0 Histologic grading system: 4 grade system Histologic grade (G): G2 Residual tumor (R): R0 - None   02/10/2021 Definitive Surgery   FINAL MICROSCOPIC DIAGNOSIS:   A. COLON, SIGMOID:  - Invasive moderately differentiated adenocarcinoma.  - Twenty-seven lymph nodes, negative for metastatic carcinoma (0/27).  - See oncology table below.   B. COLON, DISTAL, SIGMOID:  - Segment of colon with serosal adhesions, acute inflammation, and tattoo pigment.  - Negative for malignancy.    02/11/2021 Initial Diagnosis   Cancer of sigmoid colon (HCC)   03/14/2021 - 05/23/2021 Chemotherapy   Patient is on Treatment Plan : COLORECTAL Xelox (Capeox) q21d     04/20/2021 Genetic Testing   Negative hereditary cancer genetic testing: no pathogenic variants detected in [employer] CustomNext-Cancer +RNAinsight Panel.  Report date  is April 20, 2021.   The CustomNext-Cancer+RNAinsight panel offered by [employer] includes sequencing and rearrangement analysis for the following 47 genes:  APC, ATM, AXIN2, BARD1, BMPR1A, BRCA1, BRCA2, BRIP1, CDH1, CDK4, CDKN2A, CHEK2, DICER1, EPCAM, GREM1, HOXB13, MEN1, MLH1, MSH2, MSH3, MSH6, MUTYH, NBN, NF1, NF2, NTHL1, PALB2, PMS2, POLD1, POLE, PTEN, RAD51C, RAD51D, RECQL, RET, SDHA, SDHAF2, SDHB, SDHC, SDHD, SMAD4, SMARCA4, STK11, TP53, TSC1, TSC2, and VHL.  RNA data is routinely analyzed for use in variant interpretation for all genes.    02/20/2022 Imaging    IMPRESSION: 1. Similar postsurgical changes of partial sigmoidectomy with Hartmann's pouch formation and left anterior abdominal wall colostomy. No noncontrast CT evidence of local recurrence. New nonobstructive bowel/fluid containing peristomal hernia. 2. No convincing evidence of metastatic disease within the chest, abdomen, or pelvis on this noncontrast enhanced examination. 3. New multifocal subpleural foci of nodularity correspond with regions of atelectasis seen on prior imaging, favored to reflect subsegmental atelectasis. Stable 3 mm right lower lobe pulmonary nodule, favored benign. Consider attention on dedicated short-term interval follow-up chest CT. 4. Stable soft tissue along the left iliac vessels again favored to reflect left ovary given its continuity with the gonadal vessels but difficult to evaluate given lack of intravenous contrast material. Correlation with CEA levels and continued attention on follow-up imaging suggested. 5. Moderate volume of formed stool throughout the colon. Correlate for constipation.      Discussed the use of AI scribe software for clinical note transcription with the patient, who gave verbal consent to proceed.  History of Present Illness Barbara Valdez is a 48 year old female with metastatic sigmoid colon cancer who presents for routine oncology surveillance.  She is  approximately one year post-second surgery for recurrent metastatic sigmoid colon cancer. She denies abdominal pain, gastrointestinal bleeding, bowel changes, or other gastrointestinal or respiratory symptoms. Bowel movements are regular. Weight is stable at her baseline of 155 lbs.  She has undergone nine CT scans since diagnosis and prefers to limit further imaging. She previously underwent Signatera testing in August 2025.  Her hemoglobin is 11.8 g/dL. She denies bleeding, abnormal bruising, or new masses. She does not plan future pregnancies and has had prior gynecologic surgery. She follows with her primary care physician and reports no new symptoms or concerns.     All other systems were reviewed with the patient and are negative.  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    SURGICAL HISTORY: Past Surgical History:  Procedure Laterality Date   ABDOMINAL HYSTERECTOMY Bilateral 09/26/2016   Procedure: HYSTERECTOMY ABDOMINAL W/ BILATERAL SALPINGECTOMY;  Surgeon: [name], MD;  Location: [facility];  Service: Gynecology;  Laterality: Bilateral;   BIOPSY  02/09/2021   Procedure: BIOPSY;  Surgeon: Elicia Claw, MD;  Location: [facility];  Service: Gastroenterology;;   BREAST BIOPSY Left    COLONOSCOPY N/A 02/09/2021   Procedure: COLONOSCOPY;  Surgeon: Elicia Claw, MD;  Location: [facility];  Service: Gastroenterology;  Laterality: N/A;   LAPAROSCOPIC PARTIAL COLECTOMY N/A 02/10/2021   Procedure: ROBOTIC  ASSISTED PARTIAL COLECTOMY WITH COLOSTOMY;   Surgeon: Gladis Cough, MD;  Location: [facility];  Service: General;  Laterality: N/A;   SUBMUCOSAL TATTOO INJECTION  02/09/2021   Procedure: SUBMUCOSAL TATTOO INJECTION;  Surgeon: Elicia Claw, MD;  Location: [facility];  Service: Gastroenterology;;   TUBAL LIGATION      I have reviewed the social history and family history with the patient and they are unchanged from previous note.  ALLERGIES:  is allergic to ferrlecit  [na ferric gluc cplx in sucrose].  MEDICATIONS:  No current outpatient medications on file.   No current facility-administered medications for this visit.    PHYSICAL EXAMINATION: ECOG PERFORMANCE STATUS: 0 - Asymptomatic  Vitals:   01/07/24 0906  BP: 107/76  Pulse: 90  Resp: 17  Temp: 97.9 F (36.6 C)  SpO2: 100%   Wt Readings from Last 3 Encounters:  01/07/24 154 lb 14.4 oz (70.3 kg)  08/06/23 146 lb 6.4 oz (66.4 kg)  07/31/23 145 lb (65.8 kg)     GENERAL:alert, no distress and comfortable SKIN: skin color, texture, turgor are normal, no rashes or significant lesions EYES: normal, Conjunctiva are pink and non-injected, sclera clear NECK: supple, thyroid normal size, non-tender, without nodularity LYMPH:  no palpable lymphadenopathy in the cervical, axillary  LUNGS: clear to auscultation and percussion with normal breathing effort HEART: regular rate & rhythm and no murmurs and no lower extremity edema ABDOMEN:abdomen soft, non-tender and normal bowel sounds Musculoskeletal:no cyanosis of digits and no clubbing  NEURO: alert & oriented x 3 with fluent speech, no focal motor/sensory deficits  Physical Exam   LABORATORY DATA:  I have reviewed the data as listed    Latest Ref Rng & Units 01/07/2024    8:34 AM 08/06/2023    3:40 PM 07/31/2023    3:20 PM  CBC  WBC 4.0 - 10.5 K/uL 3.5  2.3  3.5   Hemoglobin 12.0 - 15.0 g/dL 88.1  89.1  88.4   Hematocrit 36.0 - 46.0 % 37.6  35.3  37.2   Platelets 150 - 400 K/uL 227  220  232         Latest  Ref Rng & Units 01/07/2024    8:34 AM 08/06/2023    3:40 PM 07/31/2023    3:20 PM  CMP  Glucose 70 - 99 mg/dL 96  84  94   BUN 6 - 20 mg/dL 14  11  17    Creatinine 0.44 - 1.00 mg/dL 9.13  9.24  9.17   Sodium 135 - 145 mmol/L 142  141  142   Potassium 3.5 - 5.1 mmol/L 4.0  3.6  3.8   Chloride 98 - 111 mmol/L 106  106  106   CO2 22 - 32 mmol/L 26  23  29    Calcium 8.9 - 10.3 mg/dL 9.8  9.4  9.7   Total Protein 6.5 - 8.1 g/dL 8.2  7.7  8.0   Total Bilirubin 0.0 - 1.2 mg/dL 0.6  0.4  0.4   Alkaline Phos 38 - 126 U/L 81  74  72   AST 15 - 41 U/L 28  19  16    ALT 0 - 44 U/L 12  7  7        RADIOGRAPHIC STUDIES: I have personally reviewed the radiological images as listed and agreed with the findings in the report. No results found.    Orders Placed This Encounter  Procedures   CT CHEST ABDOMEN PELVIS W CONTRAST    Standing  Status:   Future    Expected Date:   01/29/2024    Expiration Date:   01/06/2025    If indicated for the ordered procedure, I authorize the administration of contrast media per Radiology protocol:   Yes    Does the patient have a contrast media/X-ray dye allergy?:   No    Preferred imaging location?:   [facility]    Release to patient:   Immediate    If indicated for the ordered procedure, I authorize the administration of oral contrast media per Radiology protocol:   Yes   All questions were answered. The patient knows to call the clinic with any problems, questions or concerns. No barriers to learning was detected. The total time spent in the appointment was 25 minutes, including review of chart and various tests results, discussions about plan of care and coordination of care plan     Onita Mattock, MD 01/07/2024

## 2024-01-08 ENCOUNTER — Other Ambulatory Visit: Payer: Self-pay

## 2024-01-14 LAB — SIGNATERA
SIGNATERA MTM READOUT: 0 MTM/ml
SIGNATERA TEST RESULT: NEGATIVE

## 2024-01-16 ENCOUNTER — Other Ambulatory Visit: Payer: Self-pay

## 2024-01-16 NOTE — Progress Notes (Signed)
 As per Dr. Lanny, called patient to relay Signatera results, test results were negative, patient voiced full understanding and had no further questions.

## 2024-01-25 ENCOUNTER — Encounter (HOSPITAL_BASED_OUTPATIENT_CLINIC_OR_DEPARTMENT_OTHER): Payer: Self-pay | Admitting: Family Medicine

## 2024-01-29 ENCOUNTER — Ambulatory Visit (HOSPITAL_COMMUNITY)
Admission: RE | Admit: 2024-01-29 | Discharge: 2024-01-29 | Disposition: A | Discharge status: Home / Self Care | Source: Ambulatory Visit | Attending: Hematology | Admitting: Hematology

## 2024-01-29 ENCOUNTER — Encounter (HOSPITAL_COMMUNITY): Payer: Self-pay | Admitting: Radiology

## 2024-01-29 DIAGNOSIS — C187 Malignant neoplasm of sigmoid colon: Secondary | ICD-10-CM | POA: Insufficient documentation

## 2024-01-29 MED ORDER — IOHEXOL 9 MG/ML PO SOLN
1000.0000 mL | ORAL | Status: AC
  Administered 2024-01-29: 1000 mL via ORAL

## 2024-01-29 MED ORDER — IOHEXOL 350 MG/ML SOLN
80.0000 mL | Freq: Once | INTRAVENOUS | Status: AC | PRN
  Administered 2024-01-29: 80 mL via INTRAVENOUS

## 2024-02-04 NOTE — Progress Notes (Unsigned)
 " [facility]   [phone]) [phone] Fax:([phone]) [phone]   Clinic Follow up Note   Patient Care Team: Caudle, Thersia Bitters, FNP as PCP - General (Family Medicine) Gladis Cough, MD as Consulting Physician (General Surgery) Elicia Claw, MD as Consulting Physician (Gastroenterology) Lanny Callander, MD as Consulting Physician (Hematology) [name], MD as Consulting Physician (Obstetrics and Gynecology) 02/05/2024  I connected with [name] on 02/05/2024 at  3:40 PM EST by telephone and verified that I am speaking with the correct person using two identifiers.   I discussed the limitations, risks, security and privacy concerns of performing an evaluation and management service by telephone and the availability of in person appointments. I also discussed with the patient that there may be a patient responsible charge related to this service. The patient expressed understanding and agreed to proceed.   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 Gladis. 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 Margins and lymph nodes negative. -she received adjuvant CAPOX 03/14/21 - 05/23/21 to reduce her risk of recurrence. she had additional Xeloda  alone to complete 6 months of treatment.  -surveillance colonoscopy on 06/21/21 with Dr. Federico was unremarkable. Plan for repeat in 1 year. -restaging CT AP on 08/11/21 showed: a soft tissue nodule favored to be related to left ovary, but adenopathy not completely excluded. -Due to rising CEA, we repeated CT on 08/17/2022 which showed enlarging rounded lesion along the LEFT iliac vessels, now measuring 4.2cm, concerning  for cancer recurrence. -PET from 09/05/22 showed Intensely hypermetabolic mass in the LEFT pelvis is consistent with metastatic lymphadenopathy or local recurrence of colorectal carcinoma. -pt was referred to Dr. Ricka at [facility] and she recommended neoadjuvant RT, I recommend with concurrent Xeloda . She started on 10/10/2022 and finished on 11/15/2022. She tolerated treatment very well and the tumor marker CEA dropped down to normal again.   -She underwent surgery on December 29, 2022. No residual disease  -restaging CT 07/16/2023 and 01/29/2024 showed post-op changes no evidence of recurrence    Assessment & Plan Recurrent colon cancer, in remission She remains in remission one year post-surgery for recurrent colon cancer. Surveillance CT demonstrated no evidence of recurrence. Imaging showed a stable 1 cm right pelvic soft tissue nodule likely representing post-surgical scar tissue and stable benign-appearing liver hemangiomas and cysts, all considered likely benign. Recent Signatera testing was negative. She is asymptomatic and continues to recover from prior surgery. - Reviewed surveillance CT and discussed benign-appearing findings, including stable pelvic nodule and benign liver lesions. - Recommended continued surveillance with CT scans every six months for the next year, with consideration to extend interval if stable. - Advised continued Signatera testing every three months. - Scheduled follow-up in three months with lab.     SUMMARY OF ONCOLOGIC HISTORY: Oncology History Overview Note   Cancer Staging  Cancer of sigmoid colon [facility]) Staging form: Colon and Rectum, AJCC 8th Edition - Pathologic stage from 02/10/2021: Stage IIB (pT4a, pN0, cM0) - Signed by Lanny Callander, MD on 02/28/2021    Cancer of sigmoid colon (HCC)  11/27/2020 Imaging   CLINICAL DATA:  Abdominal pain that is began Monday. Patient reports seen scant amounts of dark blood in stool. Feels bloated.  EXAM: CT ABDOMEN AND  PELVIS WITH CONTRAST  IMPRESSION: 1. Proctocolitis involving the mid and distal sigmoid colon and upper rectum with a 3.9 cm gas containing interloop abscess centered in the sigmoid mesocolon. 2. Severe fecal burden.  No findings of bowel obstruction. 3. An enlarged left common iliac lymph node is likely reactive. 4. A 2.7 cm hemangioma is noted in the right hepatic lobe. 5. Mild focal bronchiolitis in the right lower lobe.   02/07/2021 Imaging   CLINICAL DATA:  Abdominal pain, cramping, constipation   EXAM: CT ABDOMEN AND PELVIS WITH CONTRAST  IMPRESSION: 1. There is severe, circumferential wall thickening and mucosal hyperenhancement involving the distal sigmoid colon and rectum to the anus, generally tethered appearance unchanged, involving at least 15-20 cm of distal sigmoid and rectum. 2. There is again seen an abscess at the medial aspect of the rectosigmoid junction measuring approximately 3.0 x 1.6 cm. 3. Findings are generally consistent with nonspecific infectious, inflammatory, or ischemic colitis, including inflammatory bowel disease such as Crohn's disease or ulcerative colitis; however an underlying malignancy is very difficult to exclude, particularly given substantial wall thickening. 4. Large burden of stool throughout the extremely redundant proximal colon, likely reflecting some degree of obstipation. 5. Enlarged left external iliac lymph nodes measuring up to 1.1 x 0.9 cm fall, nonspecific, possibly reactive, however nodal metastatic disease is not excluded per above.   02/09/2021 Initial Biopsy   FINAL MICROSCOPIC DIAGNOSIS:   A. COLON MASS, SIGMOID, BIOPSY:  - Adenomatous lesion with extensive high-grade dysplasia, see comment   B. RECTAL, COLON, SIGMOID, BIOPSY:  - Mild acute/ subacute nonspecific colitis, see comment  - Negative for definite features of chronicity, granulomas or dysplasia   COMMENT:  A.  Evidence of invasive carcinoma is not seen in the  submitted biopsies but the biopsy fragments appear superficial and a more severe deeper process cannot be ruled out.  B.  Histologic features in the rectosigmoid biopsies are not compatible with untreated inflammatory bowel disease.  Differential diagnosis can include infection, drug-effect and stercoral proctitis among other possibilities.    02/09/2021 Procedure   Colonoscopy, Dr. Elicia  Impression: - Preparation of the colon was fair. - Likely malignant partially obstructing tumor in the distal sigmoid colon. Biopsied. Tattooed. - Congested, inflamed and thickened folds of the mucosa in the recto-sigmoid colon. Biopsied. Tattooed.   02/10/2021 Cancer Staging   Staging form: Colon and Rectum, AJCC 8th Edition - Pathologic stage from 02/10/2021: Stage IIB (pT4a, pN0, cM0) - Signed by Lanny Callander, MD on 02/28/2021 Stage prefix: Initial diagnosis Total positive nodes: 0 Histologic grading system: 4 grade system Histologic grade (G): G2 Residual tumor (R): R0 - None   02/10/2021 Definitive Surgery   FINAL MICROSCOPIC DIAGNOSIS:   A. COLON, SIGMOID:  - Invasive moderately differentiated adenocarcinoma.  - Twenty-seven lymph nodes, negative for metastatic carcinoma (0/27).  - See oncology table below.   B. COLON, DISTAL, SIGMOID:  - Segment of colon with serosal adhesions, acute inflammation, and tattoo pigment.  - Negative for malignancy.    02/11/2021 Initial Diagnosis   Cancer of sigmoid colon (HCC)   03/14/2021 - 05/23/2021 Chemotherapy   Patient is on Treatment Plan : COLORECTAL Xelox (Capeox) q21d     04/20/2021 Genetic Testing   Negative hereditary cancer genetic testing: no pathogenic variants detected in [employer] CustomNext-Cancer +RNAinsight Panel.  Report date is April 20, 2021.   The CustomNext-Cancer+RNAinsight panel offered by [employer] includes sequencing and rearrangement analysis for the following 47 genes:  APC, ATM, AXIN2, BARD1, BMPR1A, BRCA1, BRCA2, BRIP1, CDH1,  CDK4, CDKN2A, CHEK2, DICER1, EPCAM, GREM1, HOXB13, MEN1, MLH1, MSH2, MSH3, MSH6, MUTYH, NBN, NF1, NF2, NTHL1, PALB2, PMS2, POLD1, POLE, PTEN, RAD51C, RAD51D, RECQL, RET, SDHA, SDHAF2, SDHB, SDHC, SDHD, SMAD4, SMARCA4, STK11, TP53, TSC1, TSC2, and VHL.  RNA data is routinely analyzed for use in variant interpretation for all genes.    02/20/2022 Imaging    IMPRESSION: 1. Similar postsurgical changes of partial sigmoidectomy with Hartmann's pouch formation and left anterior abdominal wall colostomy. No noncontrast CT evidence of local recurrence. New nonobstructive bowel/fluid containing peristomal hernia. 2. No convincing evidence of metastatic disease within the chest, abdomen, or pelvis on this noncontrast enhanced examination. 3. New multifocal subpleural foci of nodularity correspond with regions of atelectasis seen on prior imaging, favored to reflect subsegmental atelectasis. Stable 3 mm right lower lobe pulmonary nodule, favored benign. Consider attention on dedicated short-term interval follow-up chest CT. 4. Stable soft tissue along the left iliac vessels again favored to reflect left ovary given its continuity with the gonadal vessels but difficult to evaluate given lack of intravenous contrast material. Correlation with CEA levels and continued attention on follow-up imaging suggested. 5. Moderate volume of formed stool throughout the colon. Correlate for constipation.     Discussed the use of AI scribe software for clinical note transcription with the patient, who gave verbal consent to proceed.  History of Present Illness Barbara Valdez is a 49 year old female with recurrent colon cancer, status post bilateral oophorectomy and salpingectomy, who presents for follow-up and review of recent surveillance CT scan results.  Surveillance CT performed about one week ago showed no evidence of recurrent disease. She had not reviewed the report before this visit.  She is  asymptomatic with no pelvic pain or abnormal vaginal discharge. She feels she is still healing from recent surgery but does not describe specific new or worsening symptoms.  CT demonstrated a stable 1 cm right pelvic soft tissue nodule, stable liver lesions, and a 3 mm left lung nodule. All were unchanged from prior imaging. She previously had a negative PET scan in 2020.  Recent Signatera testing obtained at her last visit was negative. She has no new symptoms or concerns today.     REVIEW OF SYSTEMS:   Constitutional: Denies fevers, chills or abnormal weight loss Eyes: Denies blurriness of vision Ears, nose, mouth, throat, and face: Denies mucositis or sore throat Respiratory: Denies cough, dyspnea or wheezes Cardiovascular: Denies palpitation, chest discomfort or lower extremity swelling Gastrointestinal:  Denies nausea, heartburn or change in bowel habits Skin: Denies abnormal skin rashes Lymphatics: Denies new lymphadenopathy or easy bruising Neurological:Denies numbness, tingling or new weaknesses Behavioral/Psych: Mood is stable, no new changes  All other systems were reviewed with the patient and are negative.  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    SURGICAL HISTORY: Past Surgical History:  Procedure Laterality Date   ABDOMINAL HYSTERECTOMY Bilateral 09/26/2016   Procedure: HYSTERECTOMY ABDOMINAL W/ BILATERAL SALPINGECTOMY;  Surgeon: [name], MD;  Location: [facility]  ORS;  Service: Gynecology;  Laterality: Bilateral;   BIOPSY  02/09/2021    Procedure: BIOPSY;  Surgeon: Elicia Claw, MD;  Location: [facility];  Service: Gastroenterology;;   BREAST BIOPSY Left    COLONOSCOPY N/A 02/09/2021   Procedure: COLONOSCOPY;  Surgeon: Elicia Claw, MD;  Location: [facility];  Service: Gastroenterology;  Laterality: N/A;   LAPAROSCOPIC PARTIAL COLECTOMY N/A 02/10/2021   Procedure: ROBOTIC ASSISTED PARTIAL COLECTOMY WITH COLOSTOMY;  Surgeon: Gladis Cough, MD;  Location: [facility] ORS;  Service: General;  Laterality: N/A;   SUBMUCOSAL TATTOO INJECTION  02/09/2021   Procedure: SUBMUCOSAL TATTOO INJECTION;  Surgeon: Elicia Claw, MD;  Location: [facility];  Service: Gastroenterology;;   TUBAL LIGATION      I have reviewed the social history and family history with the patient and they are unchanged from previous note.  ALLERGIES:  is allergic to ferrlecit  [na ferric gluc cplx in sucrose].  MEDICATIONS:  No current outpatient medications on file.   No current facility-administered medications for this visit.    PHYSICAL EXAMINATION: Not performed   LABORATORY DATA:  I have reviewed the data as listed    Latest Ref Rng & Units 01/07/2024    8:34 AM 08/06/2023    3:40 PM 07/31/2023    3:20 PM  CBC  WBC 4.0 - 10.5 K/uL 3.5  2.3  3.5   Hemoglobin 12.0 - 15.0 g/dL 88.1  89.1  88.4   Hematocrit 36.0 - 46.0 % 37.6  35.3  37.2   Platelets 150 - 400 K/uL 227  220  232         Latest Ref Rng & Units 01/07/2024    8:34 AM 08/06/2023    3:40 PM 07/31/2023    3:20 PM  CMP  Glucose 70 - 99 mg/dL 96  84  94   BUN 6 - 20 mg/dL 14  11  17    Creatinine 0.44 - 1.00 mg/dL 9.13  9.24  9.17   Sodium 135 - 145 mmol/L 142  141  142   Potassium 3.5 - 5.1 mmol/L 4.0  3.6  3.8   Chloride 98 - 111 mmol/L 106  106  106   CO2 22 - 32 mmol/L 26  23  29    Calcium 8.9 - 10.3 mg/dL 9.8  9.4  9.7   Total Protein 6.5 - 8.1 g/dL 8.2  7.7  8.0   Total Bilirubin 0.0 - 1.2 mg/dL 0.6  0.4  0.4   Alkaline Phos 38 - 126 U/L 81  74  72   AST 15 - 41  U/L 28  19  16    ALT 0 - 44 U/L 12  7  7        RADIOGRAPHIC STUDIES: I have personally reviewed the radiological images as listed and agreed with the findings in the report. No results found.     I discussed the assessment and treatment plan with the patient. The patient was provided an opportunity to ask questions and all were answered. The patient agreed with the plan and demonstrated an understanding of the instructions.   The patient was advised to call back or seek an in-person evaluation if the symptoms worsen or if the condition fails to improve as anticipated.  I provided 15 minutes of non face-to-face telephone visit time during this encounter, including review of chart and various tests results, discussions about plan of care and coordination of care plan.    Onita Mattock, MD 02/05/2024     "

## 2024-02-04 NOTE — Assessment & Plan Note (Signed)
 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 Gladis. 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 Margins and lymph nodes negative. -she received adjuvant CAPOX 03/14/21 - 05/23/21 to reduce her risk of recurrence. she had additional Xeloda  alone to complete 6 months of treatment.  -surveillance colonoscopy on 06/21/21 with Dr. Federico was unremarkable. Plan for repeat in 1 year. -restaging CT AP on 08/11/21 showed: a soft tissue nodule favored to be related to left ovary, but adenopathy not completely excluded. -Due to rising CEA, we repeated CT on 08/17/2022 which showed enlarging rounded lesion along the LEFT iliac vessels, now measuring 4.2cm, concerning for cancer recurrence. -PET from 09/05/22 showed Intensely hypermetabolic mass in the LEFT pelvis is consistent with metastatic lymphadenopathy or local recurrence of colorectal carcinoma. -pt was referred to Dr. Ricka at [facility] and she recommended neoadjuvant RT, I recommend with concurrent Xeloda . She started on 10/10/2022 and finished on 11/15/2022. She tolerated treatment very well and the tumor marker CEA dropped down to normal again.   -She underwent surgery on December 29, 2022. No residual disease  -restaging CT 07/16/2023 and 01/29/2024 showed post-op changes no evidence of recurrence

## 2024-02-05 ENCOUNTER — Inpatient Hospital Stay: Attending: Hematology | Admitting: Hematology

## 2024-02-05 DIAGNOSIS — C187 Malignant neoplasm of sigmoid colon: Secondary | ICD-10-CM | POA: Diagnosis not present

## 2024-02-06 ENCOUNTER — Encounter (HOSPITAL_BASED_OUTPATIENT_CLINIC_OR_DEPARTMENT_OTHER): Admitting: Family Medicine

## 2024-02-12 ENCOUNTER — Encounter (HOSPITAL_BASED_OUTPATIENT_CLINIC_OR_DEPARTMENT_OTHER)

## 2024-02-24 ENCOUNTER — Encounter (HOSPITAL_BASED_OUTPATIENT_CLINIC_OR_DEPARTMENT_OTHER): Payer: Self-pay | Admitting: *Deleted

## 2024-02-25 ENCOUNTER — Encounter (HOSPITAL_BASED_OUTPATIENT_CLINIC_OR_DEPARTMENT_OTHER)

## 2024-03-04 ENCOUNTER — Encounter (HOSPITAL_BASED_OUTPATIENT_CLINIC_OR_DEPARTMENT_OTHER): Admitting: Family Medicine

## 2024-04-07 ENCOUNTER — Inpatient Hospital Stay: Attending: Hematology

## 2024-04-07 ENCOUNTER — Inpatient Hospital Stay: Admitting: Hematology

## 2024-04-08 ENCOUNTER — Ambulatory Visit (HOSPITAL_BASED_OUTPATIENT_CLINIC_OR_DEPARTMENT_OTHER)
